# Patient Record
Sex: Female | Born: 1950 | Race: Black or African American | Hispanic: No | Smoking: Current every day smoker
Health system: Southern US, Community
[De-identification: ages and names within clinical notes are randomized; demographics above are authoritative.]

## PROBLEM LIST (undated history)

## (undated) DIAGNOSIS — G709 Myoneural disorder, unspecified: Secondary | ICD-10-CM

## (undated) DIAGNOSIS — D649 Anemia, unspecified: Secondary | ICD-10-CM

## (undated) DIAGNOSIS — J449 Chronic obstructive pulmonary disease, unspecified: Secondary | ICD-10-CM

## (undated) DIAGNOSIS — F4310 Post-traumatic stress disorder, unspecified: Secondary | ICD-10-CM

## (undated) DIAGNOSIS — F1911 Other psychoactive substance abuse, in remission: Secondary | ICD-10-CM

## (undated) DIAGNOSIS — B1920 Unspecified viral hepatitis C without hepatic coma: Secondary | ICD-10-CM

## (undated) DIAGNOSIS — F419 Anxiety disorder, unspecified: Secondary | ICD-10-CM

## (undated) DIAGNOSIS — T7840XA Allergy, unspecified, initial encounter: Secondary | ICD-10-CM

## (undated) DIAGNOSIS — I209 Angina pectoris, unspecified: Secondary | ICD-10-CM

## (undated) DIAGNOSIS — E7800 Pure hypercholesterolemia, unspecified: Secondary | ICD-10-CM

## (undated) DIAGNOSIS — M1990 Unspecified osteoarthritis, unspecified site: Secondary | ICD-10-CM

## (undated) DIAGNOSIS — I10 Essential (primary) hypertension: Secondary | ICD-10-CM

## (undated) DIAGNOSIS — J40 Bronchitis, not specified as acute or chronic: Secondary | ICD-10-CM

## (undated) DIAGNOSIS — F32A Depression, unspecified: Secondary | ICD-10-CM

## (undated) DIAGNOSIS — F329 Major depressive disorder, single episode, unspecified: Secondary | ICD-10-CM

## (undated) DIAGNOSIS — G43909 Migraine, unspecified, not intractable, without status migrainosus: Secondary | ICD-10-CM

## (undated) DIAGNOSIS — K219 Gastro-esophageal reflux disease without esophagitis: Secondary | ICD-10-CM

## (undated) DIAGNOSIS — J189 Pneumonia, unspecified organism: Secondary | ICD-10-CM

## (undated) HISTORY — DX: Myoneural disorder, unspecified: G70.9

## (undated) HISTORY — DX: Depression, unspecified: F32.A

## (undated) HISTORY — DX: Anxiety disorder, unspecified: F41.9

## (undated) HISTORY — DX: Allergy, unspecified, initial encounter: T78.40XA

## (undated) HISTORY — PX: TUBAL LIGATION: SHX77

## (undated) HISTORY — DX: Unspecified viral hepatitis C without hepatic coma: B19.20

## (undated) HISTORY — PX: TONSILLECTOMY: SUR1361

## (undated) HISTORY — PX: OTHER SURGICAL HISTORY: SHX169

## (undated) HISTORY — DX: Anemia, unspecified: D64.9

## (undated) HISTORY — DX: Migraine, unspecified, not intractable, without status migrainosus: G43.909

## (undated) HISTORY — DX: Unspecified osteoarthritis, unspecified site: M19.90

## (undated) HISTORY — DX: Other psychoactive substance abuse, in remission: F19.11

## (undated) HISTORY — DX: Major depressive disorder, single episode, unspecified: F32.9

---

## 2000-10-31 ENCOUNTER — Encounter (HOSPITAL_COMMUNITY): Admission: RE | Admit: 2000-10-31 | Discharge: 2000-11-30 | Payer: Self-pay | Admitting: Unknown Physician Specialty

## 2000-11-29 ENCOUNTER — Emergency Department (HOSPITAL_COMMUNITY): Admission: EM | Admit: 2000-11-29 | Discharge: 2000-11-29 | Payer: Self-pay | Admitting: Emergency Medicine

## 2000-11-29 ENCOUNTER — Encounter: Payer: Self-pay | Admitting: Emergency Medicine

## 2001-04-05 ENCOUNTER — Emergency Department (HOSPITAL_COMMUNITY): Admission: EM | Admit: 2001-04-05 | Discharge: 2001-04-05 | Payer: Self-pay | Admitting: Emergency Medicine

## 2002-07-22 ENCOUNTER — Emergency Department (HOSPITAL_COMMUNITY): Admission: EM | Admit: 2002-07-22 | Discharge: 2002-07-22 | Payer: Self-pay | Admitting: Emergency Medicine

## 2002-09-02 ENCOUNTER — Emergency Department (HOSPITAL_COMMUNITY): Admission: EM | Admit: 2002-09-02 | Discharge: 2002-09-02 | Payer: Self-pay | Admitting: Emergency Medicine

## 2002-12-31 ENCOUNTER — Emergency Department (HOSPITAL_COMMUNITY): Admission: EM | Admit: 2002-12-31 | Discharge: 2002-12-31 | Payer: Self-pay | Admitting: Emergency Medicine

## 2003-12-04 ENCOUNTER — Emergency Department (HOSPITAL_COMMUNITY): Admission: EM | Admit: 2003-12-04 | Discharge: 2003-12-04 | Payer: Self-pay | Admitting: Emergency Medicine

## 2004-04-22 ENCOUNTER — Emergency Department (HOSPITAL_COMMUNITY): Admission: EM | Admit: 2004-04-22 | Discharge: 2004-04-22 | Payer: Self-pay | Admitting: Emergency Medicine

## 2004-10-22 ENCOUNTER — Emergency Department (HOSPITAL_COMMUNITY): Admission: EM | Admit: 2004-10-22 | Discharge: 2004-10-22 | Payer: Self-pay | Admitting: Emergency Medicine

## 2004-11-05 ENCOUNTER — Emergency Department (HOSPITAL_COMMUNITY): Admission: EM | Admit: 2004-11-05 | Discharge: 2004-11-05 | Payer: Self-pay | Admitting: Emergency Medicine

## 2004-12-26 ENCOUNTER — Emergency Department (HOSPITAL_COMMUNITY): Admission: EM | Admit: 2004-12-26 | Discharge: 2004-12-26 | Payer: Self-pay | Admitting: Emergency Medicine

## 2005-01-11 ENCOUNTER — Emergency Department (HOSPITAL_COMMUNITY): Admission: EM | Admit: 2005-01-11 | Discharge: 2005-01-11 | Payer: Self-pay | Admitting: *Deleted

## 2005-02-14 ENCOUNTER — Emergency Department (HOSPITAL_COMMUNITY): Admission: EM | Admit: 2005-02-14 | Discharge: 2005-02-14 | Payer: Self-pay | Admitting: *Deleted

## 2005-02-24 ENCOUNTER — Ambulatory Visit (HOSPITAL_COMMUNITY): Admission: RE | Admit: 2005-02-24 | Discharge: 2005-02-24 | Payer: Self-pay | Admitting: Family Medicine

## 2005-04-16 ENCOUNTER — Emergency Department (HOSPITAL_COMMUNITY): Admission: EM | Admit: 2005-04-16 | Discharge: 2005-04-16 | Payer: Self-pay | Admitting: Emergency Medicine

## 2005-05-09 ENCOUNTER — Emergency Department (HOSPITAL_COMMUNITY): Admission: EM | Admit: 2005-05-09 | Discharge: 2005-05-09 | Payer: Self-pay | Admitting: Emergency Medicine

## 2005-07-14 ENCOUNTER — Emergency Department (HOSPITAL_COMMUNITY): Admission: EM | Admit: 2005-07-14 | Discharge: 2005-07-14 | Payer: Self-pay | Admitting: Emergency Medicine

## 2005-08-07 ENCOUNTER — Emergency Department (HOSPITAL_COMMUNITY): Admission: EM | Admit: 2005-08-07 | Discharge: 2005-08-07 | Payer: Self-pay | Admitting: Emergency Medicine

## 2005-08-13 ENCOUNTER — Emergency Department (HOSPITAL_COMMUNITY): Admission: EM | Admit: 2005-08-13 | Discharge: 2005-08-13 | Payer: Self-pay | Admitting: Emergency Medicine

## 2006-04-16 ENCOUNTER — Emergency Department (HOSPITAL_COMMUNITY): Admission: EM | Admit: 2006-04-16 | Discharge: 2006-04-16 | Payer: Self-pay | Admitting: Emergency Medicine

## 2006-06-09 ENCOUNTER — Emergency Department (HOSPITAL_COMMUNITY): Admission: EM | Admit: 2006-06-09 | Discharge: 2006-06-09 | Payer: Self-pay | Admitting: Emergency Medicine

## 2007-01-18 ENCOUNTER — Ambulatory Visit (HOSPITAL_COMMUNITY): Admission: RE | Admit: 2007-01-18 | Discharge: 2007-01-18 | Payer: Self-pay | Admitting: Family Medicine

## 2007-03-25 ENCOUNTER — Ambulatory Visit: Payer: Self-pay | Admitting: Orthopedic Surgery

## 2007-03-27 ENCOUNTER — Encounter: Payer: Self-pay | Admitting: Orthopedic Surgery

## 2007-06-27 ENCOUNTER — Ambulatory Visit: Payer: Self-pay | Admitting: Orthopedic Surgery

## 2007-10-08 ENCOUNTER — Ambulatory Visit: Payer: Self-pay | Admitting: Orthopedic Surgery

## 2007-10-28 ENCOUNTER — Encounter: Payer: Self-pay | Admitting: Orthopedic Surgery

## 2007-12-04 ENCOUNTER — Ambulatory Visit: Payer: Self-pay | Admitting: Orthopedic Surgery

## 2007-12-10 ENCOUNTER — Telehealth: Payer: Self-pay | Admitting: Orthopedic Surgery

## 2007-12-12 ENCOUNTER — Telehealth: Payer: Self-pay | Admitting: Orthopedic Surgery

## 2007-12-20 ENCOUNTER — Encounter: Payer: Self-pay | Admitting: Orthopedic Surgery

## 2008-02-03 ENCOUNTER — Telehealth: Payer: Self-pay | Admitting: Orthopedic Surgery

## 2008-02-04 ENCOUNTER — Encounter: Payer: Self-pay | Admitting: Orthopedic Surgery

## 2008-02-05 ENCOUNTER — Telehealth: Payer: Self-pay | Admitting: Orthopedic Surgery

## 2008-02-29 ENCOUNTER — Emergency Department (HOSPITAL_COMMUNITY): Admission: EM | Admit: 2008-02-29 | Discharge: 2008-02-29 | Payer: Self-pay | Admitting: Emergency Medicine

## 2008-11-11 ENCOUNTER — Ambulatory Visit: Payer: Self-pay | Admitting: Family Medicine

## 2008-11-11 DIAGNOSIS — M19041 Primary osteoarthritis, right hand: Secondary | ICD-10-CM | POA: Insufficient documentation

## 2008-11-11 DIAGNOSIS — I10 Essential (primary) hypertension: Secondary | ICD-10-CM

## 2008-11-11 DIAGNOSIS — G43909 Migraine, unspecified, not intractable, without status migrainosus: Secondary | ICD-10-CM | POA: Insufficient documentation

## 2008-11-11 DIAGNOSIS — M19042 Primary osteoarthritis, left hand: Secondary | ICD-10-CM

## 2008-11-11 LAB — CONVERTED CEMR LAB
Glucose, Urine, Semiquant: NEGATIVE
Nitrite: NEGATIVE
Specific Gravity, Urine: 1.02
WBC Urine, dipstick: NEGATIVE

## 2008-11-12 ENCOUNTER — Telehealth (INDEPENDENT_AMBULATORY_CARE_PROVIDER_SITE_OTHER): Payer: Self-pay | Admitting: *Deleted

## 2008-11-12 ENCOUNTER — Encounter (INDEPENDENT_AMBULATORY_CARE_PROVIDER_SITE_OTHER): Payer: Self-pay | Admitting: Family Medicine

## 2008-11-18 ENCOUNTER — Encounter (INDEPENDENT_AMBULATORY_CARE_PROVIDER_SITE_OTHER): Payer: Self-pay | Admitting: Family Medicine

## 2008-11-18 ENCOUNTER — Ambulatory Visit (HOSPITAL_COMMUNITY): Admission: RE | Admit: 2008-11-18 | Discharge: 2008-11-18 | Payer: Self-pay | Admitting: Family Medicine

## 2008-11-19 ENCOUNTER — Encounter (INDEPENDENT_AMBULATORY_CARE_PROVIDER_SITE_OTHER): Payer: Self-pay | Admitting: Family Medicine

## 2008-11-19 LAB — CONVERTED CEMR LAB
AST: 21 units/L (ref 0–37)
Albumin: 4.2 g/dL (ref 3.5–5.2)
Alkaline Phosphatase: 58 units/L (ref 39–117)
Basophils Absolute: 0 10*3/uL (ref 0.0–0.1)
Basophils Relative: 0 % (ref 0–1)
Calcium: 9.6 mg/dL (ref 8.4–10.5)
Chloride: 102 meq/L (ref 96–112)
Eosinophils Relative: 4 % (ref 0–5)
HCT: 34.4 % — ABNORMAL LOW (ref 36.0–46.0)
HDL: 38 mg/dL — ABNORMAL LOW (ref >39)
Hemoglobin: 11.2 g/dL — ABNORMAL LOW (ref 12.0–15.0)
LDL Cholesterol: 165 mg/dL — ABNORMAL HIGH (ref 0–99)
Monocytes Relative: 5 % (ref 3–12)
Platelets: 353 10*3/uL (ref 150–400)
Total Bilirubin: 0.3 mg/dL (ref 0.3–1.2)
Total Protein: 7.4 g/dL (ref 6.0–8.3)
Triglycerides: 212 mg/dL — ABNORMAL HIGH (ref <150)
WBC: 11.2 10*3/uL — ABNORMAL HIGH (ref 4.0–10.5)

## 2008-11-25 ENCOUNTER — Ambulatory Visit: Payer: Self-pay | Admitting: Family Medicine

## 2008-11-25 ENCOUNTER — Emergency Department (HOSPITAL_COMMUNITY): Admission: EM | Admit: 2008-11-25 | Discharge: 2008-11-25 | Payer: Self-pay | Admitting: Emergency Medicine

## 2008-11-26 ENCOUNTER — Ambulatory Visit: Payer: Self-pay | Admitting: Family Medicine

## 2008-11-26 DIAGNOSIS — E785 Hyperlipidemia, unspecified: Secondary | ICD-10-CM

## 2008-11-26 DIAGNOSIS — D649 Anemia, unspecified: Secondary | ICD-10-CM

## 2008-11-26 DIAGNOSIS — E782 Mixed hyperlipidemia: Secondary | ICD-10-CM | POA: Insufficient documentation

## 2008-11-26 LAB — CONVERTED CEMR LAB
HDL goal, serum: 40 mg/dL
LDL Goal: 100 mg/dL

## 2008-11-27 ENCOUNTER — Encounter (INDEPENDENT_AMBULATORY_CARE_PROVIDER_SITE_OTHER): Payer: Self-pay | Admitting: Family Medicine

## 2008-11-27 LAB — CONVERTED CEMR LAB
Saturation Ratios: 32 % (ref 20–55)
TIBC: 289 ug/dL (ref 250–470)
UIBC: 196 ug/dL

## 2009-01-06 ENCOUNTER — Encounter (INDEPENDENT_AMBULATORY_CARE_PROVIDER_SITE_OTHER): Payer: Self-pay | Admitting: Family Medicine

## 2009-02-24 ENCOUNTER — Emergency Department (HOSPITAL_COMMUNITY): Admission: EM | Admit: 2009-02-24 | Discharge: 2009-02-24 | Payer: Self-pay | Admitting: Emergency Medicine

## 2009-03-20 ENCOUNTER — Emergency Department (HOSPITAL_COMMUNITY): Admission: EM | Admit: 2009-03-20 | Discharge: 2009-03-20 | Payer: Self-pay | Admitting: Emergency Medicine

## 2009-04-30 ENCOUNTER — Encounter: Payer: Self-pay | Admitting: Gastroenterology

## 2009-04-30 ENCOUNTER — Ambulatory Visit (HOSPITAL_COMMUNITY): Admission: RE | Admit: 2009-04-30 | Discharge: 2009-04-30 | Payer: Self-pay | Admitting: Family Medicine

## 2009-07-23 ENCOUNTER — Ambulatory Visit (HOSPITAL_COMMUNITY): Admission: RE | Admit: 2009-07-23 | Discharge: 2009-07-23 | Payer: Self-pay | Admitting: Family Medicine

## 2009-08-25 ENCOUNTER — Emergency Department (HOSPITAL_COMMUNITY): Admission: EM | Admit: 2009-08-25 | Discharge: 2009-08-25 | Payer: Self-pay | Admitting: Emergency Medicine

## 2010-03-06 ENCOUNTER — Emergency Department (HOSPITAL_COMMUNITY): Admission: EM | Admit: 2010-03-06 | Discharge: 2010-03-06 | Payer: Self-pay | Admitting: Emergency Medicine

## 2010-07-31 ENCOUNTER — Encounter: Payer: Self-pay | Admitting: Family Medicine

## 2010-08-18 ENCOUNTER — Emergency Department (HOSPITAL_COMMUNITY): Payer: Self-pay

## 2010-08-18 ENCOUNTER — Emergency Department (HOSPITAL_COMMUNITY)
Admission: EM | Admit: 2010-08-18 | Discharge: 2010-08-18 | Disposition: A | Discharge status: Home / Self Care | Payer: Self-pay | Attending: Emergency Medicine | Admitting: Emergency Medicine

## 2010-08-18 DIAGNOSIS — IMO0001 Reserved for inherently not codable concepts without codable children: Secondary | ICD-10-CM | POA: Insufficient documentation

## 2010-08-18 DIAGNOSIS — J40 Bronchitis, not specified as acute or chronic: Secondary | ICD-10-CM | POA: Insufficient documentation

## 2010-08-18 DIAGNOSIS — R062 Wheezing: Secondary | ICD-10-CM | POA: Insufficient documentation

## 2010-08-18 DIAGNOSIS — E7800 Pure hypercholesterolemia, unspecified: Secondary | ICD-10-CM | POA: Insufficient documentation

## 2010-08-18 DIAGNOSIS — R05 Cough: Secondary | ICD-10-CM | POA: Insufficient documentation

## 2010-08-18 DIAGNOSIS — Z79899 Other long term (current) drug therapy: Secondary | ICD-10-CM | POA: Insufficient documentation

## 2010-08-18 DIAGNOSIS — R509 Fever, unspecified: Secondary | ICD-10-CM | POA: Insufficient documentation

## 2010-08-18 DIAGNOSIS — I10 Essential (primary) hypertension: Secondary | ICD-10-CM | POA: Insufficient documentation

## 2010-08-18 DIAGNOSIS — R059 Cough, unspecified: Secondary | ICD-10-CM | POA: Insufficient documentation

## 2010-09-15 ENCOUNTER — Telehealth (INDEPENDENT_AMBULATORY_CARE_PROVIDER_SITE_OTHER): Payer: Self-pay | Admitting: *Deleted

## 2010-09-20 NOTE — Progress Notes (Signed)
  Phone Note Outgoing Call Call back at [facility] Phone [phone]   Call placed by: Carolan Clines  Details for Reason: Tcs Screening Summary of Call: pt answered phone but said she has no minutes left on her phone. i asked her to call the office back when she has minutes and she ended the call.

## 2010-09-29 ENCOUNTER — Telehealth: Payer: Self-pay | Admitting: Internal Medicine

## 2010-10-03 NOTE — Telephone Encounter (Signed)
I called the pt to schedule her tcs and she stated she has a lot going on right now.  She will call to schedule later.Cynthia KitchenMarland [name]

## 2010-10-14 LAB — BASIC METABOLIC PANEL
BUN: 16 mg/dL (ref 6–23)
Chloride: 106 mEq/L (ref 96–112)
GFR calc Af Amer: >60 mL/min (ref >60)
Potassium: 3.6 mEq/L (ref 3.5–5.1)

## 2010-11-11 ENCOUNTER — Encounter: Payer: Self-pay | Admitting: Internal Medicine

## 2010-12-26 ENCOUNTER — Other Ambulatory Visit (HOSPITAL_COMMUNITY): Payer: Self-pay | Admitting: Family Medicine

## 2010-12-26 DIAGNOSIS — Z139 Encounter for screening, unspecified: Secondary | ICD-10-CM

## 2010-12-30 ENCOUNTER — Ambulatory Visit (HOSPITAL_COMMUNITY)
Admission: RE | Admit: 2010-12-30 | Discharge: 2010-12-30 | Disposition: A | Discharge status: Home / Self Care | Payer: Self-pay | Source: Ambulatory Visit | Attending: Family Medicine | Admitting: Family Medicine

## 2010-12-30 DIAGNOSIS — Z139 Encounter for screening, unspecified: Secondary | ICD-10-CM

## 2010-12-30 DIAGNOSIS — Z1231 Encounter for screening mammogram for malignant neoplasm of breast: Secondary | ICD-10-CM | POA: Insufficient documentation

## 2011-01-24 ENCOUNTER — Encounter: Payer: Self-pay | Admitting: *Deleted

## 2011-01-24 ENCOUNTER — Emergency Department (HOSPITAL_COMMUNITY)
Admission: EM | Admit: 2011-01-24 | Discharge: 2011-01-24 | Disposition: A | Discharge status: Home / Self Care | Payer: BC Managed Care – PPO | Attending: Emergency Medicine | Admitting: Emergency Medicine

## 2011-01-24 DIAGNOSIS — B029 Zoster without complications: Secondary | ICD-10-CM | POA: Insufficient documentation

## 2011-01-24 HISTORY — DX: Essential (primary) hypertension: I10

## 2011-01-24 HISTORY — DX: Pure hypercholesterolemia, unspecified: E78.00

## 2011-01-24 HISTORY — DX: Bronchitis, not specified as acute or chronic: J40

## 2011-01-24 MED ORDER — OXYCODONE-ACETAMINOPHEN 5-325 MG PO TABS: 2.0000 | ORAL_TABLET | ORAL | Status: AC | PRN

## 2011-01-24 NOTE — ED Provider Notes (Signed)
History     Chief Complaint  Patient presents with  . Rash   Patient is a 60 y.o. female presenting with rash. The history is provided by the patient. No language interpreter was used.  Rash  This is a new problem. Episode onset: 1 week ago. The problem has been gradually worsening. The problem is associated with nothing. There has been no fever. Affected Location: left lateral chest and left-side abdomen. The pain is moderate. The pain has been constant since onset. Associated symptoms include pain. Pertinent negatives include no itching and no weeping.  Patient arrived via private vehicle. C/o red papular rash to left lateral chest and left abdomen onset 1 week ago with delayed onset of associated pain which is worsening since onset. Patient also notes chills, diarrhea and abdominal pain. Notes she has not previously had the shingles vaccine. Denies nausea, vomiting and fever.  PCP- Dr. Loleta Chance No past medical history on file.  No past surgical history on file.  No family history on file.  History  Substance Use Topics  . Smoking status: Not on file  . Smokeless tobacco: Not on file  . Alcohol Use: Not on file    OB History    No data available      Review of Systems  Constitutional: Positive for chills. Negative for fever.  Respiratory: Negative for shortness of breath.   Cardiovascular: Negative for chest pain.  Gastrointestinal: Positive for abdominal pain and diarrhea. Negative for nausea and vomiting.  Skin: Positive for rash. Negative for itching.  Neurological: Negative for numbness.  All other systems reviewed and are negative.  All other systems negative except as noted in HPI.    Physical Exam  There were no vitals taken for this visit.  Physical Exam  Nursing note and vitals reviewed. Constitutional: She is oriented to person, place, and time. Vital signs are normal. She appears well-developed and well-nourished.  HENT:  Head: Normocephalic and atraumatic.    Eyes: Pupils are equal, round, and reactive to light.  Neck: Neck supple.  Cardiovascular: Normal rate, regular rhythm and normal pulses.   Pulmonary/Chest: Effort normal.  Abdominal: Soft. She exhibits no distension.  Musculoskeletal: Normal range of motion.  Neurological: She is alert and oriented to person, place, and time. No sensory deficit.  Skin: Skin is warm and dry. Rash noted. No erythema.       No increased warmth over area of rash. Rash to left lateral chest wall and left abdomen along the T10-T8 dermatome which is papular in nature with no erythema or increased warmth to suggest cellulitis and no associated drainage.   Psychiatric: Her behavior is normal.       Tearful    ED Course  Procedures  MDM 8:15 AM Pt with shingles in left dermatomal region. pts rash too far out to treat with antivirals and steroids. Pt opted for nonnarcotic pain meds in ER and home with prescription for pain meds so that she can drive home. Home with pain meds and close pcp followup    Chart written by Clarita Crane acting as scribe for Dr. Patria Mane  I personally performed the services described in this documentation, which was scribed in my presence. The recorded information has been reviewed and considered.    Lyanne Co, MD 01/24/11 [phone]

## 2011-01-24 NOTE — ED Notes (Signed)
Pt states that she has had a rash x 1 week. Blisters noted to left side of abdomen and back. C/o severe pain with rash. No drainage noted.

## 2011-01-24 NOTE — ED Notes (Signed)
Patient with no complaints at this time. Respirations even and unlabored. Skin warm/dry. Discharge instructions reviewed with patient at this time. Patient given opportunity to voice concerns/ask questions. Patient discharged at this time and left Emergency Department with steady gait.   

## 2011-05-30 ENCOUNTER — Encounter (HOSPITAL_COMMUNITY): Payer: Self-pay | Admitting: Emergency Medicine

## 2011-05-30 ENCOUNTER — Emergency Department (HOSPITAL_COMMUNITY): Payer: BC Managed Care – PPO

## 2011-05-30 ENCOUNTER — Emergency Department (HOSPITAL_COMMUNITY)
Admission: EM | Admit: 2011-05-30 | Discharge: 2011-05-30 | Disposition: A | Discharge status: Home / Self Care | Payer: BC Managed Care – PPO | Attending: Emergency Medicine | Admitting: Emergency Medicine

## 2011-05-30 DIAGNOSIS — J40 Bronchitis, not specified as acute or chronic: Secondary | ICD-10-CM | POA: Insufficient documentation

## 2011-05-30 DIAGNOSIS — F17200 Nicotine dependence, unspecified, uncomplicated: Secondary | ICD-10-CM | POA: Insufficient documentation

## 2011-05-30 DIAGNOSIS — I10 Essential (primary) hypertension: Secondary | ICD-10-CM | POA: Insufficient documentation

## 2011-05-30 DIAGNOSIS — G43909 Migraine, unspecified, not intractable, without status migrainosus: Secondary | ICD-10-CM | POA: Insufficient documentation

## 2011-05-30 DIAGNOSIS — E7800 Pure hypercholesterolemia, unspecified: Secondary | ICD-10-CM | POA: Insufficient documentation

## 2011-05-30 MED ORDER — ALBUTEROL SULFATE HFA 108 (90 BASE) MCG/ACT IN AERS
2.0000 | INHALATION_SPRAY | Freq: Once | RESPIRATORY_TRACT | Status: AC
  Administered 2011-05-30: 2 via RESPIRATORY_TRACT
  Filled 2011-05-30: qty 6.7

## 2011-05-30 MED ORDER — HYDROCOD POLST-CHLORPHEN POLST 10-8 MG/5ML PO LQCR
5.0000 mL | Freq: Once | ORAL | Status: AC
  Administered 2011-05-30: 5 mL via ORAL
  Filled 2011-05-30: qty 5

## 2011-05-30 MED ORDER — GUAIFENESIN-CODEINE 100-10 MG/5ML PO SYRP: ORAL_SOLUTION | ORAL | Status: DC

## 2011-05-30 NOTE — ED Notes (Signed)
Patient states diagnosed with bronchitis; states has run out of her inhaler.  Presents tonight with chronic cough.

## 2011-05-30 NOTE — ED Notes (Signed)
Pt stable at discharge; ambulatory with no c/os

## 2011-05-30 NOTE — ED Provider Notes (Signed)
History     CSN: [id number] Arrival date & time: 05/30/2011 12:17 AM   First MD Initiated Contact with Patient 05/30/11 0006      Chief Complaint  Patient presents with  . Cough    (Consider location/radiation/quality/duration/timing/severity/associated sxs/prior treatment) HPI Comments: Pt states she ran out of her inhaler but doesn't know the name of it.  She has been coughing for the past 3 days but also states she has a chronic cough.  Patient is a 60 y.o. female presenting with cough. The history is provided by the patient. No language interpreter was used.  Cough This is a new problem. The problem occurs every few minutes. The problem has been gradually worsening. The cough is non-productive. Maximum temperature: subjective fever. Pertinent negatives include no shortness of breath and no wheezing. She has tried nothing for the symptoms. She is a smoker. Her past medical history is significant for bronchitis. Her past medical history does not include pneumonia.    Past Medical History  Diagnosis Date  . Migraine   . Hypertension   . Bronchitis   . High blood cholesterol level     Past Surgical History  Procedure Date  . Left arm     Family History  Problem Relation Age of Onset  . Heart failure Father   . Hypertension Father   . Stroke Father     History  Substance Use Topics  . Smoking status: Current Everyday Smoker -- 0.5 packs/day for 28 years    Types: Cigarettes  . Smokeless tobacco: Not on file  . Alcohol Use: No    OB History    Grav Para Term Preterm Abortions TAB SAB Ect Mult Living   2 1   1            Review of Systems  Constitutional: Positive for fever.  Respiratory: Positive for cough. Negative for chest tightness, shortness of breath, wheezing and stridor.   All other systems reviewed and are negative.    Allergies  Aspirin  Home Medications   Current Outpatient Rx  Name Route Sig Dispense Refill  . BUTALBITAL-APAP-CAFF-COD  50-325-40-30 MG PO CAPS Oral Take 1 capsule by mouth every 6 (six) hours as needed.      . IRON 28 MG PO TABS Oral Take 1 tablet by mouth 1 day or 1 dose.      Marland Kitchen LISINOPRIL-HYDROCHLOROTHIAZIDE 10-12.5 MG PO TABS Oral Take 1 tablet by mouth daily.      Marland Kitchen NAPROXEN-ESOMEPRAZOLE 500-20 MG PO TBEC Oral Take 1 tablet by mouth 1 day or 1 dose.      Marland Kitchen SIMVASTATIN 20 MG PO TABS Oral Take 20 mg by mouth at bedtime.      Marland Kitchen OVER THE COUNTER MEDICATION Oral Take 1 tablet by mouth 1 day or 1 dose.        BP 116/87  Pulse 96  Temp(Src) 98.5 F (36.9 C) (Oral)  Resp 20  Ht 5\' 5"  (1.651 m)  Wt 120 lb (54.432 kg)  BMI 19.97 kg/m2  SpO2 99%  Physical Exam  Nursing note and vitals reviewed. Constitutional: She is oriented to person, place, and time. She appears well-developed and well-nourished. No distress.  HENT:  Head: Normocephalic and atraumatic.  Eyes: EOM are normal.  Neck: Normal range of motion. No JVD present.  Cardiovascular: Normal rate, regular rhythm and normal heart sounds.   Pulmonary/Chest: Breath sounds normal. No accessory muscle usage. Not tachypneic. No respiratory distress. She has no decreased breath sounds.  She has no wheezes. She has no rales. She exhibits no tenderness.  Abdominal: Soft. She exhibits no distension. There is no tenderness.  Musculoskeletal: Normal range of motion.  Neurological: She is alert and oriented to person, place, and time.  Skin: Skin is warm and dry. She is not diaphoretic.  Psychiatric: She has a normal mood and affect. Judgment normal.    ED Course  Procedures (including critical care time)  Labs Reviewed - No data to display No results found.   No diagnosis found.    MDM          Worthy Rancher, PA 05/30/11 [phone]

## 2011-05-30 NOTE — ED Provider Notes (Signed)
Medical screening examination/treatment/procedure(s) were performed by non-physician practitioner and as supervising physician I was immediately available for consultation/collaboration.  Celene Kras, MD 05/30/11 [phone]

## 2011-07-30 ENCOUNTER — Emergency Department (HOSPITAL_COMMUNITY): Payer: BC Managed Care – PPO

## 2011-07-30 ENCOUNTER — Encounter (HOSPITAL_COMMUNITY): Payer: Self-pay | Admitting: *Deleted

## 2011-07-30 ENCOUNTER — Emergency Department (HOSPITAL_COMMUNITY)
Admission: EM | Admit: 2011-07-30 | Discharge: 2011-07-30 | Disposition: A | Discharge status: Home / Self Care | Payer: BC Managed Care – PPO | Attending: Emergency Medicine | Admitting: Emergency Medicine

## 2011-07-30 DIAGNOSIS — Z79899 Other long term (current) drug therapy: Secondary | ICD-10-CM | POA: Insufficient documentation

## 2011-07-30 DIAGNOSIS — E7800 Pure hypercholesterolemia, unspecified: Secondary | ICD-10-CM | POA: Insufficient documentation

## 2011-07-30 DIAGNOSIS — W19XXXA Unspecified fall, initial encounter: Secondary | ICD-10-CM

## 2011-07-30 DIAGNOSIS — M25512 Pain in left shoulder: Secondary | ICD-10-CM

## 2011-07-30 DIAGNOSIS — M79609 Pain in unspecified limb: Secondary | ICD-10-CM | POA: Insufficient documentation

## 2011-07-30 DIAGNOSIS — S46009A Unspecified injury of muscle(s) and tendon(s) of the rotator cuff of unspecified shoulder, initial encounter: Secondary | ICD-10-CM

## 2011-07-30 DIAGNOSIS — W010XXA Fall on same level from slipping, tripping and stumbling without subsequent striking against object, initial encounter: Secondary | ICD-10-CM | POA: Insufficient documentation

## 2011-07-30 DIAGNOSIS — S46909A Unspecified injury of unspecified muscle, fascia and tendon at shoulder and upper arm level, unspecified arm, initial encounter: Secondary | ICD-10-CM | POA: Insufficient documentation

## 2011-07-30 DIAGNOSIS — M25519 Pain in unspecified shoulder: Secondary | ICD-10-CM | POA: Insufficient documentation

## 2011-07-30 DIAGNOSIS — S4980XA Other specified injuries of shoulder and upper arm, unspecified arm, initial encounter: Secondary | ICD-10-CM | POA: Insufficient documentation

## 2011-07-30 DIAGNOSIS — I10 Essential (primary) hypertension: Secondary | ICD-10-CM | POA: Insufficient documentation

## 2011-07-30 MED ORDER — OXYCODONE-ACETAMINOPHEN 5-325 MG PO TABS
1.0000 | ORAL_TABLET | Freq: Once | ORAL | Status: AC
  Administered 2011-07-30: 1 via ORAL
  Filled 2011-07-30: qty 1

## 2011-07-30 MED ORDER — CYCLOBENZAPRINE HCL 10 MG PO TABS: 10.0000 mg | ORAL_TABLET | Freq: Three times a day (TID) | ORAL | Status: AC | PRN

## 2011-07-30 MED ORDER — HYDROCODONE-ACETAMINOPHEN 5-325 MG PO TABS: 2.0000 | ORAL_TABLET | Freq: Four times a day (QID) | ORAL | Status: AC | PRN

## 2011-07-30 NOTE — ED Notes (Signed)
Pt states that she slipped and fell yesterday. C/o pain in her left arm.

## 2011-07-30 NOTE — ED Provider Notes (Signed)
Scribed for Ward Givens, MD, the patient was seen in room APA07/APA07 . This chart was scribed by Ellie Lunch.   CSN: [id number]  Arrival date & time 07/30/11  6962   First MD Initiated Contact with Patient 07/30/11 [phone]      Chief Complaint  Patient presents with  . Fall    (Consider location/radiation/quality/duration/timing/severity/associated sxs/prior treatment) HPI  Pt seen at 7:49 AM Cynthia [name] is a 61 y.o. female who presents to the Emergency Department complaining of a fall. Pt fell yesterday on some ice. Pt fell sideways to the left landing on her left arm. Pt denies hitting head or LOC. Pt complains of pain to left arm. Pain is located entire arm. Pain is the worst in the left upper arm and left shouldler. Pain is worse with abduction of left arm. Pt denies any other pain related to injury. Pt is left hand dominant. Movement of the arm or palpation makes the pain worse Pt has previously seen Dr. Romeo Apple for orthopedic care.   PCP [facility].   Past Medical History  Diagnosis Date  . Migraine   . Hypertension   . Bronchitis   . High blood cholesterol level     Past Surgical History  Procedure Date  . Left arm     Family History  Problem Relation Age of Onset  . Heart failure Father   . Hypertension Father   . Stroke Father     History  Substance Use Topics  . Smoking status: Current Everyday Smoker -- 0.5 packs/day for 28 years    Types: Cigarettes  . Smokeless tobacco: Not on file  . Alcohol Use: No  Currently trying to quit smoking Pt is not currently working. 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  Lives with spouse  OB History    Grav Para Term Preterm Abortions TAB SAB Ect Mult Living   2 1   1            Review of Systems  Musculoskeletal:       Left arm and shoulder pain  Neurological: Negative for syncope and headaches.  All other systems reviewed and are negative.    Allergies  Aspirin  Home Medications   Current Outpatient Rx  Name  Route Sig Dispense Refill  . BUTALBITAL-APAP-CAFF-COD 50-325-40-30 MG PO CAPS Oral Take 1 capsule by mouth every 6 (six) hours as needed.      . IRON 28 MG PO TABS Oral Take 1 tablet by mouth 1 day or 1 dose.      Marland Kitchen GUAIFENESIN-CODEINE 100-10 MG/5ML PO SYRP  10 ml q 4-6 hrs prn cough 240 mL 0  . LISINOPRIL-HYDROCHLOROTHIAZIDE 10-12.5 MG PO TABS Oral Take 1 tablet by mouth daily.      Marland Kitchen NAPROXEN-ESOMEPRAZOLE 500-20 MG PO TBEC Oral Take 1 tablet by mouth 1 day or 1 dose.      Marland Kitchen OVER THE COUNTER MEDICATION Oral Take 1 tablet by mouth 1 day or 1 dose.      Marland Kitchen SIMVASTATIN 20 MG PO TABS Oral Take 20 mg by mouth at bedtime.        BP 134/80  Pulse 75  Temp(Src) 97.9 F (36.6 C) (Oral)  Resp 18  Ht 5\' 5"  (1.651 m)  Wt 113 lb (51.256 kg)  BMI 18.80 kg/m2  SpO2 100% Vital signs normal    Physical Exam  Nursing note and vitals reviewed. Constitutional: She is oriented to person, place, and time. She appears well-developed and well-nourished.  Non-toxic appearance. She does not appear ill. No distress.  HENT:  Head: Normocephalic and atraumatic.  Right Ear: External ear normal.  Left Ear: External ear normal.  Nose: Nose normal. No mucosal edema or rhinorrhea.  Mouth/Throat: Oropharynx is clear and moist and mucous membranes are normal. No dental abscesses or uvula swelling.  Eyes: Conjunctivae and EOM are normal. Pupils are equal, round, and reactive to light.  Neck: Normal range of motion and full passive range of motion without pain. Neck supple.  Cardiovascular: Normal rate, regular rhythm and normal heart sounds.  Exam reveals no gallop and no friction rub.   No murmur heard. Pulmonary/Chest: Effort normal and breath sounds normal. No respiratory distress. She has no wheezes. She has no rhonchi. She has no rales. She exhibits no tenderness and no crepitus.  Abdominal: Normal appearance.  Musculoskeletal: Normal range of motion. She exhibits no edema and no tenderness.       Pt has  non-tender clavicles. She has no pain on flex/ext of her left elbow or wrist. Her hand is non-tender to palpation. She is very tender to palpation in her left shoulder with mild swelling. She has pain on attempted ROM of her left shoulder. Pulses and sensation intact.   Neurological: She is alert and oriented to person, place, and time. She has normal strength. No cranial nerve deficit.  Skin: Skin is warm, dry and intact. No rash noted. No erythema. No pallor.  Psychiatric: She has a normal mood and affect. Her speech is normal and behavior is normal. Her mood appears not anxious.    ED Course  Procedures (including critical care time)  Pt given Ice pack and percocet for pain. After reviewing her xrays she was placed in a sling by nursing staff.    DIAGNOSTIC STUDIES: Oxygen Saturation is 100% on room air, normal by my interpretation.    COORDINATION OF CARE:  Dg Shoulder Left  07/30/2011  *RADIOLOGY REPORT*  Clinical Data: Left shoulder pain, fall  LEFT SHOULDER - 2+ VIEW  Comparison: None.  Findings: No fracture or dislocation is seen.  The joint spaces are preserved.  The visualized soft tissues are unremarkable.  Visualized left lung is clear.  IMPRESSION: No fracture or dislocation is seen.  Original Report Authenticated By: Charline Bills, M.D.    ED MEDICATIONS Medications  oxyCODONE-acetaminophen (PERCOCET) 5-325 MG per tablet 1 tablet     Diagnoses that have been ruled out:  None  Diagnoses that are still under consideration:  None  Final diagnoses:  Fall  Shoulder pain, left  Rotator cuff injury   New Prescriptions   CYCLOBENZAPRINE (FLEXERIL) 10 MG TABLET    Take 1 tablet (10 mg total) by mouth 3 (three) times daily as needed for muscle spasms.   HYDROCODONE-ACETAMINOPHEN (NORCO) 5-325 MG PER TABLET    Take 2 tablets by mouth every 6 (six) hours as needed for pain.    Plan discharge  MDM    I personally performed the services described in this  documentation, which was scribed in my presence. The recorded information has been reviewed and considered. Devoria Albe, MD, FACEP        Ward Givens, MD 07/30/11 [phone]

## 2011-08-10 ENCOUNTER — Ambulatory Visit: Payer: BC Managed Care – PPO | Admitting: Orthopedic Surgery

## 2011-09-11 ENCOUNTER — Emergency Department (HOSPITAL_COMMUNITY): Payer: Self-pay

## 2011-09-11 ENCOUNTER — Encounter (HOSPITAL_COMMUNITY): Payer: Self-pay | Admitting: *Deleted

## 2011-09-11 DIAGNOSIS — Y92009 Unspecified place in unspecified non-institutional (private) residence as the place of occurrence of the external cause: Secondary | ICD-10-CM | POA: Insufficient documentation

## 2011-09-11 DIAGNOSIS — IMO0002 Reserved for concepts with insufficient information to code with codable children: Secondary | ICD-10-CM | POA: Insufficient documentation

## 2011-09-11 DIAGNOSIS — F17200 Nicotine dependence, unspecified, uncomplicated: Secondary | ICD-10-CM | POA: Insufficient documentation

## 2011-09-11 DIAGNOSIS — S92919A Unspecified fracture of unspecified toe(s), initial encounter for closed fracture: Secondary | ICD-10-CM | POA: Insufficient documentation

## 2011-09-11 DIAGNOSIS — G43909 Migraine, unspecified, not intractable, without status migrainosus: Secondary | ICD-10-CM | POA: Insufficient documentation

## 2011-09-11 DIAGNOSIS — I10 Essential (primary) hypertension: Secondary | ICD-10-CM | POA: Insufficient documentation

## 2011-09-11 DIAGNOSIS — E7800 Pure hypercholesterolemia, unspecified: Secondary | ICD-10-CM | POA: Insufficient documentation

## 2011-09-11 NOTE — ED Notes (Signed)
Pt reports she hit her rt foot on the door frame this am and now c/o pain to top of rt foot and great toe

## 2011-09-12 ENCOUNTER — Emergency Department (HOSPITAL_COMMUNITY)
Admission: EM | Admit: 2011-09-12 | Discharge: 2011-09-12 | Disposition: A | Discharge status: Home / Self Care | Payer: Self-pay | Attending: Emergency Medicine | Admitting: Emergency Medicine

## 2011-09-12 DIAGNOSIS — S92919A Unspecified fracture of unspecified toe(s), initial encounter for closed fracture: Secondary | ICD-10-CM

## 2011-09-12 MED ORDER — OXYCODONE-ACETAMINOPHEN 5-325 MG PO TABS: 1.0000 | ORAL_TABLET | ORAL | Status: AC | PRN

## 2011-09-12 MED ORDER — OXYCODONE-ACETAMINOPHEN 5-325 MG PO TABS
1.0000 | ORAL_TABLET | Freq: Once | ORAL | Status: AC
  Administered 2011-09-12: 1 via ORAL
  Filled 2011-09-12: qty 1

## 2011-09-12 NOTE — ED Provider Notes (Signed)
Medical screening examination/treatment/procedure(s) were performed by non-physician practitioner and as supervising physician I was immediately available for consultation/collaboration. Devoria Albe, MD, Armando Gang   Ward Givens, MD 09/12/11 [phone]

## 2011-09-12 NOTE — ED Provider Notes (Signed)
History     CSN: [id number]  Arrival date & time 09/11/11  2248   First MD Initiated Contact with Patient 09/12/11 0015      Chief Complaint  Patient presents with  . Foot Injury    (Consider location/radiation/quality/duration/timing/severity/associated sxs/prior treatment) Patient is a 61 y.o. female presenting with foot injury. The history is provided by the patient.  Foot Injury  The incident occurred 6 to 12 hours ago. The incident occurred at home. The injury mechanism was a direct blow. The pain is present in the right foot. The quality of the pain is described as aching. The pain is at a severity of 10/10. The pain is severe. The pain has been constant since onset. Pertinent negatives include no numbness and no loss of sensation. The symptoms are aggravated by bearing weight and palpation. She has tried nothing for the symptoms.    Past Medical History  Diagnosis Date  . Migraine   . Hypertension   . Bronchitis   . High blood cholesterol level     Past Surgical History  Procedure Date  . Left arm     Family History  Problem Relation Age of Onset  . Heart failure Father   . Hypertension Father   . Stroke Father     History  Substance Use Topics  . Smoking status: Current Everyday Smoker -- 0.5 packs/day for 28 years    Types: Cigarettes  . Smokeless tobacco: Not on file  . Alcohol Use: No    OB History    Grav Para Term Preterm Abortions TAB SAB Ect Mult Living   2 1   1            Review of Systems  Constitutional: Negative for fever.  HENT: Negative for congestion, sore throat and neck pain.   Eyes: Negative.   Respiratory: Negative for chest tightness and shortness of breath.   Cardiovascular: Negative for chest pain.  Gastrointestinal: Negative for nausea and abdominal pain.  Genitourinary: Negative.   Musculoskeletal: Positive for arthralgias. Negative for joint swelling.  Skin: Negative.  Negative for rash and wound.  Neurological: Negative  for dizziness, weakness, light-headedness, numbness and headaches.  Hematological: Negative.   Psychiatric/Behavioral: Negative.     Allergies  Aspirin  Home Medications   Current Outpatient Rx  Name Route Sig Dispense Refill  . BUTALBITAL-APAP-CAFF-COD 50-325-40-30 MG PO CAPS Oral Take 1 capsule by mouth every 6 (six) hours as needed.      . IRON 28 MG PO TABS Oral Take 1 tablet by mouth 1 day or 1 dose.      Marland Kitchen GUAIFENESIN-CODEINE 100-10 MG/5ML PO SYRP  10 ml q 4-6 hrs prn cough 240 mL 0  . LISINOPRIL-HYDROCHLOROTHIAZIDE 10-12.5 MG PO TABS Oral Take 1 tablet by mouth daily.      Marland Kitchen NAPROXEN-ESOMEPRAZOLE 500-20 MG PO TBEC Oral Take 1 tablet by mouth 1 day or 1 dose.      Marland Kitchen OVER THE COUNTER MEDICATION Oral Take 1 tablet by mouth 1 day or 1 dose.      . OXYCODONE-ACETAMINOPHEN 5-325 MG PO TABS Oral Take 1 tablet by mouth every 4 (four) hours as needed for pain. 20 tablet 0  . SIMVASTATIN 20 MG PO TABS Oral Take 20 mg by mouth at bedtime.        BP 105/72  Pulse 93  Temp(Src) 97.8 F (36.6 C) (Oral)  Resp 20  Ht 5\' 5"  (1.651 m)  Wt 113 lb (51.256 kg)  BMI  18.80 kg/m2  SpO2 97%  Physical Exam  Nursing note and vitals reviewed. Constitutional: She is oriented to person, place, and time. She appears well-developed and well-nourished.  HENT:  Head: Normocephalic.  Eyes: Conjunctivae are normal.  Neck: Normal range of motion.  Cardiovascular: Normal rate and intact distal pulses.  Exam reveals no decreased pulses.   Pulses:      Dorsalis pedis pulses are 2+ on the right side, and 2+ on the left side.  Pulmonary/Chest: Effort normal.  Musculoskeletal: She exhibits edema and tenderness.       Feet:  Neurological: She is alert and oriented to person, place, and time. No sensory deficit.  Skin: Skin is warm, dry and intact.    ED Course  Procedures (including critical care time)  Labs Reviewed - No data to display Dg Foot Complete Right  09/11/2011  *RADIOLOGY REPORT*   Clinical Data: Hit right foot on door frame, pain, bruising at great toe  RIGHT FOOT COMPLETE - 3+ VIEW  Comparison: None  Findings: Mild hallux valgus. Joint spaces preserved. Fracture identified at base of distal phalanx right great toe with intra-articular extension at IP joint. No additional fracture, dislocation, or bone destruction.  IMPRESSION: Fracture at base of distal phalanx right great toe with intra- articular extension at the IP joint. Hallux valgus.  Original Report Authenticated By: Lollie Marrow, M.D.     1. Phalanx fracture, foot       MDM  Buddy tape,  Post op shoe applied.  Referral to Dr.Keeling for recheck this week.        Candis Musa, PA 09/12/11 1258

## 2011-09-12 NOTE — Discharge Instructions (Signed)
Toe Fracture  Your caregiver has diagnosed you as having a fractured toe. A toe fracture is a break in the bone of a toe. "Buddy taping" is a way of splinting your broken toe, by taping the broken toe to the toe next to it. This "buddy taping" will keep the injured toe from moving beyond normal range of motion. Buddy taping also helps the toe heal in a more normal alignment. It may take 6 to 8 weeks for the toe injury to heal.  HOME CARE INSTRUCTIONS   · Leave your toes taped together for as long as directed by your caregiver or until you see a doctor for a follow-up examination. You can change the tape after bathing. Always use a small piece of gauze or cotton between the toes when taping them together. This will help the skin stay dry and prevent infection.  · Apply ice to the injury for 15 to 20 minutes each hour while awake for the first 2 days. Put the ice in a plastic bag and place a towel between the bag of ice and your skin.  · After the first 2 days, apply heat to the injured area. Use heat for the next 2 to 3 days. Place a heating pad on the foot or soak the foot in warm water as directed by your caregiver.  · Keep your foot elevated as much as possible to lessen swelling.  · Wear sturdy, supportive shoes. The shoes should not pinch the toes or fit tightly against the toes.  · Your caregiver may prescribe a rigid shoe if your foot is very swollen.  · Your may be given crutches if the pain is too great and it hurts too much to walk.  · Only take over-the-counter or prescription medicines for pain, discomfort, or fever as directed by your caregiver.  · If your caregiver has given you a follow-up appointment, it is very important to keep that appointment. Not keeping the appointment could result in a chronic or permanent injury, pain, and disability. If there is any problem keeping the appointment, you must call back to this facility for assistance.  SEEK MEDICAL CARE IF:   · You have increased pain or  swelling, not relieved with medications.  · The pain does not get better after 1 week.  · Your injured toe is cold when the others are warm.  SEEK IMMEDIATE MEDICAL CARE IF:   · The toe becomes cold, numb, or white.  · The toe becomes hot (inflamed) and red.  Document Released: 06/23/2000 Document Revised: 06/15/2011 Document Reviewed: 02/10/2008  [employer]® Patient Information ©2012 [employer].

## 2011-12-30 ENCOUNTER — Encounter (HOSPITAL_COMMUNITY): Payer: Self-pay

## 2011-12-30 ENCOUNTER — Emergency Department (HOSPITAL_COMMUNITY)
Admission: EM | Admit: 2011-12-30 | Discharge: 2011-12-30 | Disposition: A | Discharge status: Home / Self Care | Payer: BC Managed Care – PPO | Attending: Emergency Medicine | Admitting: Emergency Medicine

## 2011-12-30 DIAGNOSIS — F17200 Nicotine dependence, unspecified, uncomplicated: Secondary | ICD-10-CM | POA: Insufficient documentation

## 2011-12-30 DIAGNOSIS — E7800 Pure hypercholesterolemia, unspecified: Secondary | ICD-10-CM | POA: Insufficient documentation

## 2011-12-30 DIAGNOSIS — I10 Essential (primary) hypertension: Secondary | ICD-10-CM | POA: Insufficient documentation

## 2011-12-30 DIAGNOSIS — G43909 Migraine, unspecified, not intractable, without status migrainosus: Secondary | ICD-10-CM | POA: Insufficient documentation

## 2011-12-30 MED ORDER — PROMETHAZINE HCL 25 MG PO TABS: 25.0000 mg | ORAL_TABLET | Freq: Four times a day (QID) | ORAL | Status: DC | PRN

## 2011-12-30 MED ORDER — HYDROCODONE-ACETAMINOPHEN 5-325 MG PO TABS: 1.0000 | ORAL_TABLET | Freq: Four times a day (QID) | ORAL | Status: AC | PRN

## 2011-12-30 MED ORDER — PROMETHAZINE HCL 25 MG/ML IJ SOLN
25.0000 mg | Freq: Once | INTRAMUSCULAR | Status: AC
  Administered 2011-12-30: 25 mg via INTRAMUSCULAR
  Filled 2011-12-30: qty 1

## 2011-12-30 MED ORDER — HYDROMORPHONE HCL PF 2 MG/ML IJ SOLN
2.0000 mg | Freq: Once | INTRAMUSCULAR | Status: AC
  Administered 2011-12-30: 2 mg via INTRAMUSCULAR
  Filled 2011-12-30: qty 1

## 2011-12-30 NOTE — ED Notes (Signed)
Complain of migraine for three days

## 2011-12-30 NOTE — Discharge Instructions (Signed)
Return for new or worse symptoms take medication as directed followup with her regular doctor in the next few days.

## 2011-12-30 NOTE — ED Provider Notes (Signed)
History    This chart was scribed for Cynthia Jakes, MD, MD by Smitty Pluck. The patient was seen in room APA19 and the patient's care was started at 12:49PM.   CSN: [id number]  Arrival date & time 12/30/11  4098   First MD Initiated Contact with Patient 12/30/11 1049      Chief Complaint  Patient presents with  . Migraine    (Consider location/radiation/quality/duration/timing/severity/associated sxs/prior treatment) Patient is a 61 y.o. female presenting with migraine. The history is provided by the patient.  Migraine Pertinent negatives include no chest pain, no abdominal pain and no shortness of breath.   Cynthia [name] Cynthia [name] is a 61 y.o. female who presents to the Emergency Department complaining of moderate migraine onset 3 days ago. Pain is located on right side. Pt reports hx of migraines and this one feels similar. Pt usually gets dilaudid injection while in ED. Symptoms have been constant since onset. Pt reports having visual change of seeing dots. There is no radiation.  PCP is Dr. Loleta Chance  Past Medical History  Diagnosis Date  . Migraine   . Hypertension   . Bronchitis   . High blood cholesterol level     Past Surgical History  Procedure Date  . Left arm     Family History  Problem Relation Age of Onset  . Heart failure Father   . Hypertension Father   . Stroke Father     History  Substance Use Topics  . Smoking status: Current Everyday Smoker -- 0.5 packs/day for 28 years    Types: Cigarettes  . Smokeless tobacco: Not on file  . Alcohol Use: No    OB History    Grav Para Term Preterm Abortions TAB SAB Ect Mult Living   2 1   1            Review of Systems  Constitutional: Negative for fever and chills.  HENT: Negative for congestion and neck pain.   Eyes: Positive for visual disturbance.  Respiratory: Negative for cough and shortness of breath.   Cardiovascular: Negative for chest pain.  Gastrointestinal: Negative for nausea, vomiting and  abdominal pain.  Musculoskeletal: Negative for back pain.  Skin: Negative for rash.    Allergies  Aspirin  Home Medications   Current Outpatient Rx  Name Route Sig Dispense Refill  . BUTALBITAL-APAP-CAFF-COD 50-325-40-30 MG PO CAPS Oral Take 1 capsule by mouth every 6 (six) hours as needed.      . IRON 28 MG PO TABS Oral Take 1 tablet by mouth 1 day or 1 dose.      Marland Kitchen GUAIFENESIN-CODEINE 100-10 MG/5ML PO SYRP  10 ml q 4-6 hrs prn cough 240 mL 0  . HYDROCODONE-ACETAMINOPHEN 5-325 MG PO TABS Oral Take 1-2 tablets by mouth every 6 (six) hours as needed for pain. 10 tablet 0  . LISINOPRIL-HYDROCHLOROTHIAZIDE 10-12.5 MG PO TABS Oral Take 1 tablet by mouth daily.      Marland Kitchen NAPROXEN-ESOMEPRAZOLE 500-20 MG PO TBEC Oral Take 1 tablet by mouth 1 day or 1 dose.      Marland Kitchen OVER THE COUNTER MEDICATION Oral Take 1 tablet by mouth 1 day or 1 dose.      Marland Kitchen PROMETHAZINE HCL 25 MG PO TABS Oral Take 1 tablet (25 mg total) by mouth every 6 (six) hours as needed for nausea. 12 tablet 0  . SIMVASTATIN 20 MG PO TABS Oral Take 20 mg by mouth at bedtime.        BP 122/81  Pulse 104  Temp 98.3 F (36.8 C) (Oral)  Resp 20  Ht 5\' 4"  (1.626 m)  Wt 119 lb (53.978 kg)  BMI 20.43 kg/m2  SpO2 100%  Physical Exam  Nursing note and vitals reviewed. Constitutional: She is oriented to person, place, and time. She appears well-developed and well-nourished. No distress.  HENT:  Head: Normocephalic and atraumatic.  Mouth/Throat: Oropharynx is clear and moist.  Cardiovascular: Normal rate, regular rhythm and normal heart sounds.   No murmur heard. Pulmonary/Chest: Effort normal and breath sounds normal. No respiratory distress.  Abdominal: Soft. Bowel sounds are normal. She exhibits no distension. There is no tenderness. There is no rebound.  Musculoskeletal: She exhibits no tenderness.  Lymphadenopathy:    She has no cervical adenopathy.  Neurological: She is alert and oriented to person, place, and time. No cranial  nerve deficit. Coordination normal.  Skin: Skin is warm and dry.  Psychiatric: She has a normal mood and affect. Her behavior is normal.    ED Course  Procedures (including critical care time) DIAGNOSTIC STUDIES: Oxygen Saturation is 100% on room air, normal by my interpretation.    COORDINATION OF CARE: 12:51PM EDP discusses pt ED treatment with pt.    Labs Reviewed - No data to display No results found.   1. Migraine       MDM  Patient symptoms are consistent with typical migraine for her just worse. No significant focal findings improved in the emergency department with IM to lauded and IM Phenergan discharged home normally she takes furosemide with codeine for her migraines we will syndrome with some hydrocodone Phenergan in the meantime incision contact her primary care doctor.  I personally performed the services described in this documentation, which was scribed in my presence. The recorded information has been reviewed and considered.         Cynthia Jakes, MD 12/30/11 [phone]

## 2011-12-30 NOTE — ED Notes (Signed)
Pt requesting pain medication. Aplogised and explained to pt that an emergency came in and the EMD had been tied. Explained that EMD would see pt as soon as possible. NAD. Pt states she ran out of her pain medication 2 days ago. States pain to head, denies Nausea or vomiting. Patient advocate also making frequent rounds to pt room.

## 2011-12-30 NOTE — ED Notes (Signed)
Migraine x 3 days. Denies N/V.

## 2012-06-25 ENCOUNTER — Encounter (HOSPITAL_COMMUNITY): Payer: Self-pay | Admitting: Emergency Medicine

## 2012-06-25 ENCOUNTER — Emergency Department (HOSPITAL_COMMUNITY)
Admission: EM | Admit: 2012-06-25 | Discharge: 2012-06-25 | Disposition: A | Discharge status: Home / Self Care | Payer: Self-pay | Attending: Emergency Medicine | Admitting: Emergency Medicine

## 2012-06-25 DIAGNOSIS — I10 Essential (primary) hypertension: Secondary | ICD-10-CM | POA: Insufficient documentation

## 2012-06-25 DIAGNOSIS — Z8709 Personal history of other diseases of the respiratory system: Secondary | ICD-10-CM | POA: Insufficient documentation

## 2012-06-25 DIAGNOSIS — Z79899 Other long term (current) drug therapy: Secondary | ICD-10-CM | POA: Insufficient documentation

## 2012-06-25 DIAGNOSIS — E7800 Pure hypercholesterolemia, unspecified: Secondary | ICD-10-CM | POA: Insufficient documentation

## 2012-06-25 DIAGNOSIS — M542 Cervicalgia: Secondary | ICD-10-CM | POA: Insufficient documentation

## 2012-06-25 DIAGNOSIS — F17200 Nicotine dependence, unspecified, uncomplicated: Secondary | ICD-10-CM | POA: Insufficient documentation

## 2012-06-25 DIAGNOSIS — R110 Nausea: Secondary | ICD-10-CM | POA: Insufficient documentation

## 2012-06-25 DIAGNOSIS — G43909 Migraine, unspecified, not intractable, without status migrainosus: Secondary | ICD-10-CM | POA: Insufficient documentation

## 2012-06-25 MED ORDER — PROMETHAZINE HCL 25 MG PO TABS: 25.0000 mg | ORAL_TABLET | Freq: Four times a day (QID) | ORAL | Status: DC | PRN

## 2012-06-25 MED ORDER — ONDANSETRON 8 MG PO TBDP
8.0000 mg | ORAL_TABLET | Freq: Once | ORAL | Status: AC
  Administered 2012-06-25: 8 mg via ORAL
  Filled 2012-06-25: qty 1

## 2012-06-25 MED ORDER — MORPHINE SULFATE 10 MG/ML IJ SOLN
10.0000 mg | Freq: Once | INTRAMUSCULAR | Status: AC
  Administered 2012-06-25: 10 mg via INTRAMUSCULAR
  Filled 2012-06-25: qty 1

## 2012-06-25 MED ORDER — OXYCODONE-ACETAMINOPHEN 5-325 MG PO TABS: 1.0000 | ORAL_TABLET | Freq: Four times a day (QID) | ORAL | Status: DC | PRN

## 2012-06-25 NOTE — ED Provider Notes (Signed)
History   This chart was scribed for Cynthia Hutching, MD by Sofie Rower, ED Scribe. The patient was seen in room APA15/APA15 and the patient's care was started at 7:05PM.     CSN: [id number]  Arrival date & time 06/25/12  0619   First MD Initiated Contact with Patient 06/25/12 [phone]      Chief Complaint  Patient presents with  . Migraine    (Consider location/radiation/quality/duration/timing/severity/associated sxs/prior treatment) The history is provided by the patient. No language interpreter was used.    Cynthia [name] Cynthia [name] is a 61 y.o. female , with a hx of migraine headahces, who presents to the Emergency Department complaining of sudden, progressively worsening, migraine headache, located at the right temporal region of her head, radiating downwards towards the right side of the neck, onset one week ago. Associated symptoms include nausea. The pt reports she has experienced migraine headaches in the past, however, the headache she is experiencing today is much more severe in comparison. The pt has taken goodies headache powder which does not provide relief of the migraine headache.  The pt denies any episodes of vomiting.   The pt is a current everyday smoker (0.5 packs/day), however, she does not drink alcohol.   PCP is Dr. Loleta Chance.    Past Medical History  Diagnosis Date  . Migraine   . Hypertension   . Bronchitis   . High blood cholesterol level     Past Surgical History  Procedure Date  . Left arm     Family History  Problem Relation Age of Onset  . Heart failure Father   . Hypertension Father   . Stroke Father     History  Substance Use Topics  . Smoking status: Current Every Day Smoker -- 0.5 packs/day for 28 years    Types: Cigarettes  . Smokeless tobacco: Not on file  . Alcohol Use: No    OB History    Grav Para Term Preterm Abortions TAB SAB Ect Mult Living   2 1   1            Review of Systems  Gastrointestinal: Positive for nausea. Negative for  vomiting.  Neurological: Positive for headaches.  All other systems reviewed and are negative.    Allergies  Aspirin  Home Medications   Current Outpatient Rx  Name  Route  Sig  Dispense  Refill  . IRON 28 MG PO TABS   Oral   Take 1 tablet by mouth 1 day or 1 dose.           Marland Kitchen LISINOPRIL-HYDROCHLOROTHIAZIDE 10-12.5 MG PO TABS   Oral   Take 1 tablet by mouth daily.           Marland Kitchen NAPROXEN-ESOMEPRAZOLE 500-20 MG PO TBEC   Oral   Take 1 tablet by mouth 1 day or 1 dose.           Marland Kitchen SIMVASTATIN 20 MG PO TABS   Oral   Take 20 mg by mouth at bedtime.           Marland Kitchen BUTALBITAL-APAP-CAFF-COD 50-325-40-30 MG PO CAPS   Oral   Take 1 capsule by mouth every 6 (six) hours as needed.           . GUAIFENESIN-CODEINE 100-10 MG/5ML PO SYRP      10 ml q 4-6 hrs prn cough   240 mL   0   . OVER THE COUNTER MEDICATION   Oral   Take 1 tablet by mouth  1 day or 1 dose.           Marland Kitchen PROMETHAZINE HCL 25 MG PO TABS   Oral   Take 1 tablet (25 mg total) by mouth every 6 (six) hours as needed for nausea.   12 tablet   0     BP 150/82  Pulse 82  Temp 97.9 F (36.6 C) (Oral)  Resp 18  Ht 5\' 5"  (1.651 m)  Wt 120 lb (54.432 kg)  BMI 19.97 kg/m2  SpO2 97%  Physical Exam  Nursing note and vitals reviewed. Constitutional: She is oriented to person, place, and time. She appears well-developed and well-nourished.  HENT:  Head: Normocephalic and atraumatic.  Eyes: Conjunctivae normal and EOM are normal. Pupils are equal, round, and reactive to light.  Neck: Normal range of motion. Neck supple.  Cardiovascular: Normal rate, regular rhythm and normal heart sounds.   Pulmonary/Chest: Effort normal and breath sounds normal.  Abdominal: Soft. Bowel sounds are normal.  Musculoskeletal: Normal range of motion.  Neurological: She is alert and oriented to person, place, and time.  Skin: Skin is warm and dry.  Psychiatric: She has a normal mood and affect.    ED Course  Procedures  (including critical care time)  DIAGNOSTIC STUDIES: Oxygen Saturation is 97% on room air, normal by my interpretation.    COORDINATION OF CARE:  7:10 AM- Treatment plan concerning pain management (IM morphine) discussed with patient. Pt agrees with treatment.      Labs Reviewed - No data to display No results found.   No diagnosis found.    MDM  History and physical consistent with uncomplicated migraine..  Rx IM morphine and by mouth Zofran.  Discharge meds Percocet #15 and Phenergan 25 mg #15       I personally performed the services described in this documentation, which was scribed in my presence. The recorded information has been reviewed and is accurate.    Cynthia Hutching, MD 06/25/12 [phone]

## 2012-06-25 NOTE — ED Notes (Signed)
Patient c/o migraine x 1 week; states has been taking Goody powder without relief.  Patient denies any nausea or vomiting.

## 2013-01-08 ENCOUNTER — Encounter (HOSPITAL_COMMUNITY): Payer: Self-pay | Admitting: Emergency Medicine

## 2013-01-08 ENCOUNTER — Emergency Department (HOSPITAL_COMMUNITY): Payer: Medicaid Other

## 2013-01-08 ENCOUNTER — Inpatient Hospital Stay (HOSPITAL_COMMUNITY)
Admission: EM | Admit: 2013-01-08 | Discharge: 2013-01-20 | DRG: 207 | Disposition: A | Discharge status: Home Health | Payer: Medicaid Other | Attending: Internal Medicine | Admitting: Internal Medicine

## 2013-01-08 DIAGNOSIS — E782 Mixed hyperlipidemia: Secondary | ICD-10-CM | POA: Diagnosis present

## 2013-01-08 DIAGNOSIS — I5033 Acute on chronic diastolic (congestive) heart failure: Secondary | ICD-10-CM | POA: Diagnosis not present

## 2013-01-08 DIAGNOSIS — F17200 Nicotine dependence, unspecified, uncomplicated: Secondary | ICD-10-CM

## 2013-01-08 DIAGNOSIS — G43909 Migraine, unspecified, not intractable, without status migrainosus: Secondary | ICD-10-CM | POA: Diagnosis present

## 2013-01-08 DIAGNOSIS — J42 Unspecified chronic bronchitis: Secondary | ICD-10-CM

## 2013-01-08 DIAGNOSIS — R000 Tachycardia, unspecified: Secondary | ICD-10-CM

## 2013-01-08 DIAGNOSIS — I509 Heart failure, unspecified: Secondary | ICD-10-CM | POA: Diagnosis not present

## 2013-01-08 DIAGNOSIS — D72829 Elevated white blood cell count, unspecified: Secondary | ICD-10-CM

## 2013-01-08 DIAGNOSIS — I498 Other specified cardiac arrhythmias: Secondary | ICD-10-CM | POA: Diagnosis present

## 2013-01-08 DIAGNOSIS — J9601 Acute respiratory failure with hypoxia: Secondary | ICD-10-CM

## 2013-01-08 DIAGNOSIS — M7150 Other bursitis, not elsewhere classified, unspecified site: Secondary | ICD-10-CM

## 2013-01-08 DIAGNOSIS — J189 Pneumonia, unspecified organism: Principal | ICD-10-CM

## 2013-01-08 DIAGNOSIS — G4700 Insomnia, unspecified: Secondary | ICD-10-CM | POA: Diagnosis not present

## 2013-01-08 DIAGNOSIS — J9600 Acute respiratory failure, unspecified whether with hypoxia or hypercapnia: Secondary | ICD-10-CM | POA: Diagnosis not present

## 2013-01-08 DIAGNOSIS — M129 Arthropathy, unspecified: Secondary | ICD-10-CM

## 2013-01-08 DIAGNOSIS — G9340 Encephalopathy, unspecified: Secondary | ICD-10-CM | POA: Diagnosis not present

## 2013-01-08 DIAGNOSIS — I10 Essential (primary) hypertension: Secondary | ICD-10-CM | POA: Diagnosis present

## 2013-01-08 DIAGNOSIS — D649 Anemia, unspecified: Secondary | ICD-10-CM

## 2013-01-08 DIAGNOSIS — E785 Hyperlipidemia, unspecified: Secondary | ICD-10-CM

## 2013-01-08 DIAGNOSIS — Z79899 Other long term (current) drug therapy: Secondary | ICD-10-CM

## 2013-01-08 DIAGNOSIS — J301 Allergic rhinitis due to pollen: Secondary | ICD-10-CM

## 2013-01-08 DIAGNOSIS — E870 Hyperosmolality and hypernatremia: Secondary | ICD-10-CM | POA: Diagnosis not present

## 2013-01-08 LAB — CBC WITH DIFFERENTIAL/PLATELET
Basophils Absolute: 0 10*3/uL (ref 0.0–0.1)
Lymphocytes Relative: 18 % (ref 12–46)
Neutro Abs: 11.5 10*3/uL — ABNORMAL HIGH (ref 1.7–7.7)
Neutrophils Relative %: 75 % (ref 43–77)
Platelets: 224 10*3/uL (ref 150–400)
RDW: 14.8 % (ref 11.5–15.5)
WBC: 15.3 10*3/uL — ABNORMAL HIGH (ref 4.0–10.5)

## 2013-01-08 LAB — BASIC METABOLIC PANEL
Calcium: 10 mg/dL (ref 8.4–10.5)
Creatinine, Ser: 0.77 mg/dL (ref 0.50–1.10)
GFR calc Af Amer: >90 mL/min (ref >90)

## 2013-01-08 LAB — EXPECTORATED SPUTUM ASSESSMENT W GRAM STAIN, RFLX TO RESP C

## 2013-01-08 LAB — STREP PNEUMONIAE URINARY ANTIGEN: Strep Pneumo Urinary Antigen: NEGATIVE

## 2013-01-08 LAB — TROPONIN I: Troponin I: <0.3 ng/mL (ref <0.30)

## 2013-01-08 MED ORDER — DEXTROSE 5 % IV SOLN
500.0000 mg | INTRAVENOUS | Status: DC
  Administered 2013-01-09 – 2013-01-10 (×2): 500 mg via INTRAVENOUS
  Filled 2013-01-08 (×5): qty 500

## 2013-01-08 MED ORDER — PREDNISONE 20 MG PO TABS
40.0000 mg | ORAL_TABLET | Freq: Every day | ORAL | Status: DC
  Administered 2013-01-09 – 2013-01-11 (×3): 40 mg via ORAL
  Filled 2013-01-08 (×4): qty 2

## 2013-01-08 MED ORDER — ALBUTEROL SULFATE (5 MG/ML) 0.5% IN NEBU
10.0000 mg | INHALATION_SOLUTION | Freq: Once | RESPIRATORY_TRACT | Status: AC
  Administered 2013-01-08: 10 mg via RESPIRATORY_TRACT
  Filled 2013-01-08: qty 2

## 2013-01-08 MED ORDER — ENOXAPARIN SODIUM 40 MG/0.4ML ~~LOC~~ SOLN
40.0000 mg | SUBCUTANEOUS | Status: DC
  Administered 2013-01-08 – 2013-01-20 (×13): 40 mg via SUBCUTANEOUS
  Filled 2013-01-08 (×13): qty 0.4

## 2013-01-08 MED ORDER — ALBUTEROL SULFATE (5 MG/ML) 0.5% IN NEBU
2.5000 mg | INHALATION_SOLUTION | Freq: Once | RESPIRATORY_TRACT | Status: AC
  Administered 2013-01-08: 2.5 mg via RESPIRATORY_TRACT
  Filled 2013-01-08: qty 0.5

## 2013-01-08 MED ORDER — OXYCODONE-ACETAMINOPHEN 5-325 MG PO TABS
1.0000 | ORAL_TABLET | Freq: Four times a day (QID) | ORAL | Status: DC | PRN
  Administered 2013-01-08 – 2013-01-09 (×2): 2 via ORAL
  Administered 2013-01-09: 1 via ORAL
  Administered 2013-01-09 – 2013-01-10 (×6): 2 via ORAL
  Filled 2013-01-08 (×9): qty 2

## 2013-01-08 MED ORDER — ALUM & MAG HYDROXIDE-SIMETH 200-200-20 MG/5ML PO SUSP
30.0000 mL | Freq: Four times a day (QID) | ORAL | Status: DC | PRN
  Filled 2013-01-08: qty 30

## 2013-01-08 MED ORDER — ALBUTEROL SULFATE (5 MG/ML) 0.5% IN NEBU
2.5000 mg | INHALATION_SOLUTION | Freq: Four times a day (QID) | RESPIRATORY_TRACT | Status: DC
  Administered 2013-01-08 – 2013-01-10 (×9): 2.5 mg via RESPIRATORY_TRACT
  Filled 2013-01-08 (×9): qty 0.5

## 2013-01-08 MED ORDER — DEXTROSE 5 % IV SOLN
1.0000 g | Freq: Once | INTRAVENOUS | Status: AC
  Administered 2013-01-08: 1 g via INTRAVENOUS
  Filled 2013-01-08: qty 10

## 2013-01-08 MED ORDER — GUAIFENESIN-CODEINE 100-10 MG/5ML PO SOLN
2.5000 mL | ORAL | Status: DC | PRN
  Administered 2013-01-08 – 2013-01-11 (×11): 2.5 mL via ORAL
  Filled 2013-01-08 (×11): qty 5

## 2013-01-08 MED ORDER — AZITHROMYCIN 250 MG PO TABS
500.0000 mg | ORAL_TABLET | Freq: Once | ORAL | Status: AC
  Administered 2013-01-08: 500 mg via ORAL
  Filled 2013-01-08: qty 2

## 2013-01-08 MED ORDER — OXYCODONE-ACETAMINOPHEN 5-325 MG PO TABS
1.0000 | ORAL_TABLET | Freq: Once | ORAL | Status: AC
  Administered 2013-01-08: 1 via ORAL
  Filled 2013-01-08: qty 1

## 2013-01-08 MED ORDER — ALBUTEROL SULFATE (5 MG/ML) 0.5% IN NEBU
5.0000 mg | INHALATION_SOLUTION | Freq: Once | RESPIRATORY_TRACT | Status: AC
  Administered 2013-01-08: 5 mg via RESPIRATORY_TRACT
  Filled 2013-01-08: qty 1

## 2013-01-08 MED ORDER — IPRATROPIUM BROMIDE 0.02 % IN SOLN
0.5000 mg | Freq: Once | RESPIRATORY_TRACT | Status: AC
  Administered 2013-01-08: 0.5 mg via RESPIRATORY_TRACT
  Filled 2013-01-08: qty 2.5

## 2013-01-08 MED ORDER — ONDANSETRON HCL 4 MG PO TABS: 4.0000 mg | ORAL_TABLET | Freq: Four times a day (QID) | ORAL | Status: DC | PRN

## 2013-01-08 MED ORDER — SODIUM CHLORIDE 0.9 % IV SOLN
INTRAVENOUS | Status: DC
  Administered 2013-01-08 – 2013-01-13 (×8): via INTRAVENOUS
  Administered 2013-01-15: 500 mL via INTRAVENOUS

## 2013-01-08 MED ORDER — PREDNISONE 50 MG PO TABS
60.0000 mg | ORAL_TABLET | Freq: Once | ORAL | Status: AC
  Administered 2013-01-08: 60 mg via ORAL
  Filled 2013-01-08: qty 1

## 2013-01-08 MED ORDER — DEXTROSE 5 % IV SOLN
1.0000 g | INTRAVENOUS | Status: DC
  Administered 2013-01-09 – 2013-01-10 (×2): 1 g via INTRAVENOUS
  Filled 2013-01-08 (×5): qty 10

## 2013-01-08 MED ORDER — PNEUMOCOCCAL VAC POLYVALENT 25 MCG/0.5ML IJ INJ
0.5000 mL | INJECTION | INTRAMUSCULAR | Status: AC
  Administered 2013-01-09: 0.5 mL via INTRAMUSCULAR
  Filled 2013-01-08: qty 0.5

## 2013-01-08 MED ORDER — ONDANSETRON HCL 4 MG/2ML IJ SOLN: 4.0000 mg | Freq: Four times a day (QID) | INTRAMUSCULAR | Status: DC | PRN

## 2013-01-08 MED ORDER — HYDROCODONE-ACETAMINOPHEN 5-325 MG PO TABS
1.0000 | ORAL_TABLET | ORAL | Status: DC | PRN
  Administered 2013-01-08 (×2): 1 via ORAL
  Filled 2013-01-08 (×2): qty 1

## 2013-01-08 MED ORDER — ACETAMINOPHEN 325 MG PO TABS
650.0000 mg | ORAL_TABLET | Freq: Four times a day (QID) | ORAL | Status: DC | PRN
  Administered 2013-01-08: 650 mg via ORAL
  Filled 2013-01-08: qty 2

## 2013-01-08 MED ORDER — IPRATROPIUM BROMIDE 0.02 % IN SOLN
0.5000 mg | Freq: Four times a day (QID) | RESPIRATORY_TRACT | Status: DC
  Administered 2013-01-08 – 2013-01-10 (×9): 0.5 mg via RESPIRATORY_TRACT
  Filled 2013-01-08 (×8): qty 2.5

## 2013-01-08 MED ORDER — HYDROMORPHONE HCL PF 1 MG/ML IJ SOLN
0.5000 mg | INTRAMUSCULAR | Status: DC | PRN
  Administered 2013-01-09 – 2013-01-11 (×2): 0.5 mg via INTRAVENOUS
  Filled 2013-01-08 (×2): qty 1

## 2013-01-08 MED ORDER — BIOTENE DRY MOUTH MT LIQD
15.0000 mL | Freq: Two times a day (BID) | OROMUCOSAL | Status: DC
  Administered 2013-01-08 – 2013-01-11 (×6): 15 mL via OROMUCOSAL

## 2013-01-08 MED ORDER — ACETAMINOPHEN 650 MG RE SUPP: 650.0000 mg | Freq: Four times a day (QID) | RECTAL | Status: DC | PRN

## 2013-01-08 MED ORDER — SIMVASTATIN 20 MG PO TABS
20.0000 mg | ORAL_TABLET | Freq: Every day | ORAL | Status: DC
  Administered 2013-01-08 – 2013-01-19 (×12): 20 mg via ORAL
  Filled 2013-01-08 (×13): qty 1

## 2013-01-08 NOTE — ED Notes (Signed)
Respiratory in room with a breathing treatment

## 2013-01-08 NOTE — H&P (Signed)
[facility] History and Physical  [name]:[id number] DOB: 1950/10/23 DOA: 01/08/2013  Referring physician:  PCP: Evlyn Courier, MD  Specialists:   Chief Complaint: Cough  HPI: Cynthia [name] is a 62 y.o. female with past medical history that includes hypertension, bronchitis, migraine presents to the emergency room with a chief complaint of cough and shortness of breath. Information is obtained from the patient. She reports that 2 days ago she developed some sinus congestion. One day ago she developed cough nonproductive as well as wheezing with shortness of breath on exertion. She denies any chest pain palpitation syncope near syncope. She states that she used her home inhaler without relief. Associated symptoms include subjective fever with chills general malaise and decreased appetite. She denies any orthopnea or lower extremity edema. She denies any abdominal pain but does report some nausea with one episode of clear emesis. She denies diarrhea constipation, melena, dysuria hematuria frequency or urgency. Lab work in the emergency room is significant for a white count of 15.3 and proBNP 247. Chest x-ray yields diffuse interstitial pattern suggesting atypical infections/mycoplasma. Vital signs significant for an oral temperature of 99.9 a heart rate of 118 respiratory rate of 22. Oxygen saturation level 97% on 2 L of oxygen however when ambulated in the emergency room on room air her oxygen saturation level dropped to 88%. It rose to 96% after rest and oxygen supplementation. Symptoms came on suddenly have persisted and worsened. Symptoms characterized as moderate. [facility] asked to admit   Review of Systems: The patient denies  weight loss,, vision loss, decreased hearing, hoarseness, chest pain, syncope,  peripheral edema, balance deficits, hemoptysis, abdominal pain, melena, hematochezia, severe indigestion/heartburn, hematuria, incontinence, genital sores, muscle  weakness, suspicious skin lesions, transient blindness, difficulty walking, depression, unusual weight change, abnormal bleeding, enlarged lymph nodes, angioedema, and breast masses.    Past Medical History  Diagnosis Date  . Migraine   . Hypertension   . Bronchitis   . High blood cholesterol level    Past Surgical History  Procedure Laterality Date  . Left arm     Social History:  reports that she has been smoking Cigarettes.  She has a 20 (40 years 1/2 pack per day) pack-year smoking history. She has never used smokeless tobacco. She reports that she does not drink alcohol or use illicit drugs. Patient is married and lives with her husband. She is currently unemployed as she provides care for her husband who is disabled  Allergies  Allergen Reactions  . Aspirin     REACTION: Stomach upset    Family History  Problem Relation Age of Onset  . Heart failure Father   . Hypertension Father   . Stroke Father     Prior to Admission medications   Medication Sig Start Date End Date Taking? Authorizing Provider  butalbital-acetaminophen-caffeine (FIORICET WITH CODEINE) 50-325-40-30 MG per capsule Take 1 capsule by mouth every 6 (six) hours as needed.      Historical Provider, MD  Ferrous Sulfate (IRON) 28 MG TABS Take 1 tablet by mouth 1 day or 1 dose.      Historical Provider, MD  guaiFENesin-codeine [facility]) 100-10 MG/5ML syrup 10 ml q 4-6 hrs prn cough 05/30/11   Richard Hyacinth Meeker, PA-C  lisinopril-hydrochlorothiazide (PRINZIDE,ZESTORETIC) 10-12.5 MG per tablet Take 1 tablet by mouth daily.      Historical Provider, MD  Naproxen-Esomeprazole 500-20 MG TBEC Take 1 tablet by mouth 1 day or 1 dose.      Historical  Provider, MD  OVER THE COUNTER MEDICATION Take 1 tablet by mouth 1 day or 1 dose.      Historical Provider, MD  oxyCODONE-acetaminophen (PERCOCET/ROXICET) 5-325 MG per tablet Take 1-2 tablets by mouth every 6 (six) hours as needed for pain. 06/25/12   Donnetta Hutching, MD   promethazine (PHENERGAN) 25 MG tablet Take 1 tablet (25 mg total) by mouth every 6 (six) hours as needed for nausea. 12/30/11 01/06/12  Shelda Jakes, MD  promethazine (PHENERGAN) 25 MG tablet Take 1 tablet (25 mg total) by mouth every 6 (six) hours as needed for nausea. 06/25/12   Donnetta Hutching, MD  simvastatin (ZOCOR) 20 MG tablet Take 20 mg by mouth at bedtime.      Historical Provider, MD   Physical Exam: Filed Vitals:   01/08/13 0200 01/08/13 0504 01/08/13 0510 01/08/13 0623  BP: 142/94 118/74  119/75  Pulse: 113   108  Temp:    98.6 F (37 C)  TempSrc:    Oral  Resp:  22  22  Height:    5\' 5"  (1.651 m)  Weight:    53.8 kg (118 lb 9.7 oz)  SpO2:  96% 97% 97%     General:  Well-nourished alert ill appearing  Eyes: PE RRL, EOMI  ENT: Ears clear no nasal drainage oropharynx without erythema or exudate. Mucous membranes of her mouth are pink slightly dry  Neck: Supple no lymphadenopathy  Cardiovascular: Tachycardic but regular no murmur gallop or rub no lower extremity edema pedal pulses present and palpable  Respiratory: Moderate increased work of breathing with conversation. Frequent vigorous dry cough. Breath sounds distant and left face with faint expiratory wheeze no rhonchi no crackles  Abdomen: Soft positive bowel sounds nontender to palpation no mass organomegaly noted  Skin: Warm and dry no rash no lesion  Musculoskeletal: No clubbing no cyanosis  Psychiatric: Calm cooperative  Neurologic: Cranial nerves II through XII grossly intact speech clear facial symmetry  Labs on Admission:  Basic Metabolic Panel:  Recent Labs Lab 01/08/13 0217  NA 135  K 3.7  CL 98  CO2 25  GLUCOSE 135*  BUN 13  CREATININE 0.77  CALCIUM 10.0   CBC:  Recent Labs Lab 01/08/13 0217  WBC 15.3*  NEUTROABS 11.5*  HGB 11.1*  HCT 33.3*  MCV 84.9  PLT 224   Cardiac Enzymes:  Recent Labs Lab 01/08/13 0217  TROPONINI <0.30    BNP (last 3 results)  Recent Labs   01/08/13 0217  PROBNP 247.0*   Radiological Exams on Admission: Dg Chest 2 View  01/08/2013   *RADIOLOGY REPORT*  Clinical Data: Cough.  Nasal congestion.  Wheezing.  CHEST - 2 VIEW  Comparison: 05/30/2011.  Findings: Cardiopericardial silhouette upper limits of normal for projection.  There is new diffuse interstitial prominence present with right basilar distribution.  The findings suggest interstitial pulmonary edema.  In a patient with fever and chills, atypical infection such as mycoplasma can also produce this appearance. There is no focal consolidation.  IMPRESSION: Diffuse interstitial pattern, suggesting atypical infections/mycoplasma in this patient with infectious symptoms.   Original Report Authenticated By: Andreas Newport, M.D.    EKG: Independently reviewed. Sinus tachycardia  Assessment/Plan Principal Problem:   Acute respiratory failure with hypoxia: Again very 2 community-acquired pneumonia. Will admit patient to MedSurg. Will provide oxygen supplementation. Will monitor vital signs with oxygen saturation level every 4 hours. Will provide nebs. Will continue low-dose prednisone daily that was started in the emergency room. Monitor  closely Active Problems:  CAP (community acquired pneumonia): Will continue azithromycin and Rocephin intravenously. Patient is hemodynamically stable. Will support with IV fluids. Will check blood cultures if temp over 101.5. Will check Legionella antigen and strep pneumo. Will check sputum culture.    Leukocytosis, unspecified: Likely related to #2. In addition patient received 60 mg of prednisone in the emergency room. Monitor vital signs every 4 hours x3. Antibiotics as above.    Sinus tachycardia: Likely related to #1 and 2 as well as some mild dehydration do to decreased by mouth intake of the last 2 days. Will support with IV fluids. Will check vital signs every 4 hours x3.   HYPERTENSION: Blood pressure stable. Will hold time antihypertensives  for now. Will monitor every 4 hours x3.    BRONCHITIS, CHRONIC: See #1 and #2.    HYPERLIPIDEMIA: Will check lipid panel continue statin.    MIGRAINE HEADACHE: No recent migraine. Stable at baseline. Will hold home pain meds for now.      Code Status: Full Family Communication: None available Disposition Plan: Home when ready likely 48 hours.  Time spent: 65 minutes.  Gwenyth Bender [facility] Pager [phone]  If 7PM-7AM, please contact night-coverage [email or web address] Password [facility] 01/08/2013, 8:45 AM   I have seen and examined this patient and I agree with the above assessment and plan.

## 2013-01-08 NOTE — Progress Notes (Signed)
UR chart review completed.  

## 2013-01-08 NOTE — ED Notes (Signed)
Patient c/o cough and nasal congestion x 2 days; states has been using Mucinex without relief.

## 2013-01-08 NOTE — ED Notes (Signed)
Ambulated in hall by Jonny Ruiz NT. Pt become mildly short of breath towards the end of ambulation. O2 sats when back were 89%. Pt placed on 2L 02 via [place]. 02 sats now 96%. Dr Bebe Shaggy notified and is in room assessing pt at this time.

## 2013-01-08 NOTE — ED Provider Notes (Signed)
History    CSN: [id number] Arrival date & time 01/08/13  0130  First MD Initiated Contact with Patient 01/08/13 0143     Chief Complaint  Patient presents with  . Cough  . Nasal Congestion  . Wheezing   Patient is a 62 y.o. female presenting with cough. The history is provided by the patient.  Cough Cough characteristics:  Productive Sputum characteristics:  Clear Severity:  Moderate Onset quality:  Sudden Duration:  2 days Timing:  Intermittent Progression:  Worsening Chronicity:  Recurrent Smoker: yes   Relieved by:  Nothing Associated symptoms: chills, shortness of breath, sinus congestion and wheezing   Associated symptoms: no chest pain   pt reports onset of cough, congestion, short of breath and wheezing over two days ago She reports it was sudden onset She reports chills She denies CP She reports similar to previous bronchitis Denies known h/o icu admissions She is a smoker She denies any other known lung disease No new LE edema She denies hemoptysis  Past Medical History  Diagnosis Date  . Migraine   . Hypertension   . Bronchitis   . High blood cholesterol level    Past Surgical History  Procedure Laterality Date  . Left arm     Family History  Problem Relation Age of Onset  . Heart failure Father   . Hypertension Father   . Stroke Father    History  Substance Use Topics  . Smoking status: Current Every Day Smoker -- 0.50 packs/day for 28 years    Types: Cigarettes  . Smokeless tobacco: Not on file  . Alcohol Use: No   OB History   Grav Para Term Preterm Abortions TAB SAB Ect Mult Living   2 1   1           Review of Systems  Constitutional: Positive for chills.  Respiratory: Positive for cough, shortness of breath and wheezing.   Cardiovascular: Negative for chest pain.  All other systems reviewed and are negative.    Allergies  Aspirin  Home Medications   Current Outpatient Rx  Name  Route  Sig  Dispense  Refill  .  butalbital-acetaminophen-caffeine (FIORICET WITH CODEINE) 50-325-40-30 MG per capsule   Oral   Take 1 capsule by mouth every 6 (six) hours as needed.           . Ferrous Sulfate (IRON) 28 MG TABS   Oral   Take 1 tablet by mouth 1 day or 1 dose.           Marland Kitchen guaiFENesin-codeine (ROBITUSSIN AC) 100-10 MG/5ML syrup      10 ml q 4-6 hrs prn cough   240 mL   0   . lisinopril-hydrochlorothiazide (PRINZIDE,ZESTORETIC) 10-12.5 MG per tablet   Oral   Take 1 tablet by mouth daily.           . Naproxen-Esomeprazole 500-20 MG TBEC   Oral   Take 1 tablet by mouth 1 day or 1 dose.           Marland Kitchen OVER THE COUNTER MEDICATION   Oral   Take 1 tablet by mouth 1 day or 1 dose.           . oxyCODONE-acetaminophen (PERCOCET/ROXICET) 5-325 MG per tablet   Oral   Take 1-2 tablets by mouth every 6 (six) hours as needed for pain.   15 tablet   0   . EXPIRED: promethazine (PHENERGAN) 25 MG tablet   Oral   Take  1 tablet (25 mg total) by mouth every 6 (six) hours as needed for nausea.   12 tablet   0   . promethazine (PHENERGAN) 25 MG tablet   Oral   Take 1 tablet (25 mg total) by mouth every 6 (six) hours as needed for nausea.   15 tablet   0   . simvastatin (ZOCOR) 20 MG tablet   Oral   Take 20 mg by mouth at bedtime.            BP 143/79  Pulse 118  Temp(Src) 99.9 F (37.7 C) (Oral)  Resp 22  Ht 5\' 5"  (1.651 m)  Wt 125 lb (56.7 kg)  BMI 20.8 kg/m2  SpO2 100% BP 118/74  Pulse 113  Temp(Src) 99.9 F (37.7 C) (Oral)  Resp 22  Ht 5\' 5"  (1.651 m)  Wt 125 lb (56.7 kg)  BMI 20.8 kg/m2  SpO2 97%  Physical Exam CONSTITUTIONAL: Well developed/well nourished HEAD: Normocephalic/atraumatic EYES: EOMI/PERRL ENMT: Mucous membranes moist, nasal congestion noted NECK: supple no meningeal signs CV: S1/S2 noted, no murmurs/rubs/gallops noted LUNGS: mild tachypnea noted. Coarse BS noted bilaterally with harsh wheeze noted ABDOMEN: soft, nontender, no rebound or guarding GU:no  cva tenderness NEURO: Pt is awake/alert, moves all extremitiesx4 EXTREMITIES: pulses normal, full ROM, no LE edema noted.  No calf tenderness SKIN: warm, color normal PSYCH: no abnormalities of mood noted  ED Course  Procedures  Labs Reviewed  CBC WITH DIFFERENTIAL - Abnormal; Notable for the following:    WBC 15.3 (*)    Hemoglobin 11.1 (*)    HCT 33.3 (*)    Neutro Abs 11.5 (*)    All other components within normal limits  BASIC METABOLIC PANEL  TROPONIN I  PRO B NATRIURETIC PEPTIDE   Dg Chest 2 View  01/08/2013   *RADIOLOGY REPORT*  Clinical Data: Cough.  Nasal congestion.  Wheezing.  CHEST - 2 VIEW  Comparison: 05/30/2011.  Findings: Cardiopericardial silhouette upper limits of normal for projection.  There is new diffuse interstitial prominence present with right basilar distribution.  The findings suggest interstitial pulmonary edema.  In a patient with fever and chills, atypical infection such as mycoplasma can also produce this appearance. There is no focal consolidation.  IMPRESSION: Diffuse interstitial pattern, suggesting atypical infections/mycoplasma in this patient with infectious symptoms.   Original Report Authenticated By: Andreas Newport, M.D.   2:58 AM Pt with harsh wheezing, continued treatments ordered She denies any recent hospitalizations Suspect this is more likely infectious rather than ACS/PE/CHF Will treat for pneumonia.  Will continue to monitor closely  5:32 AM After nebs, pt still tachypneic with coarse wheezing bilaterally After ambulation she was hypoxic down to 89% She is also diaphoretic While at rest, her resp status improved.   She will need admission due to community acquired pneumonia D/w dr Orvan Falconer, will admit Pt agreeable  MDM  Nursing notes including past medical history and social history reviewed and considered in documentation xrays reviewed and considered Labs/vital reviewed and considered     Date: 01/08/2013  Rate: 110   Rhythm: sinus tachycardia  QRS Axis: normal  Intervals: normal  ST/T Wave abnormalities: nonspecific ST changes  Conduction Disutrbances:none  Narrative Interpretation:   Old EKG Reviewed: none available at time of interpretation       Joya Gaskins, MD 01/08/13 [phone]

## 2013-01-08 NOTE — ED Notes (Addendum)
Entered in error

## 2013-01-08 NOTE — ED Notes (Signed)
States she feels better after meds/nebulizers. Pt now resting in bed, appears comfortable, wheezing still noted however sounds improved over last assessment.

## 2013-01-08 NOTE — Care Management Note (Signed)
    Page 1 of 2   01/20/2013     3:04:18 PM   CARE MANAGEMENT NOTE 01/20/2013  Patient:  Cynthia [name], Cynthia [name]   Account Number:  [id number]  Date Initiated:  01/08/2013  Documentation initiated by:  Sharrie Rothman  Subjective/Objective Assessment:   Pt admitted from home with pneumonia. Pt lives with her husband and is independent with ADL's.     Action/Plan:   Pt may need MATCH voucher at discharge. Financial counselor is aware of self pay status. Will continue to follow.   Anticipated DC Date:  01/20/2013   Anticipated DC Plan:  HOME W HOME HEALTH SERVICES  In-house referral  Financial Counselor      DC Planning Services  MATCH Program  CM consult  [facility]      [facility] Choice  HOME HEALTH   Choice offered to / List presented to:  C-1 Patient        HH arranged  HH-1 RN      [facility] agency  [facility]   Status of service:  Completed, signed off Medicare Important Message given?   (If response is "NO", the following Medicare IM given date fields will be blank) Date Medicare IM given:   Date Additional Medicare IM given:    Discharge Disposition:  HOME W HOME HEALTH SERVICES  Per UR Regulation:  Reviewed for med. necessity/level of care/duration of stay  If discussed at Long Length of Stay Meetings, dates discussed:   01/14/2013  01/16/2013    Comments:  01/08/13 1145 Arlyss Queen, RN BSN CM  01/20/13 15:01 Letha Cape RN, BSN [phone] patient is for dc today, will need HHRN, referral made to [facility], soc will begin 24-48 hrs post discharge.  Patient states she needs ast with medications, will ast with Match Program.  Patient states she has transportation.  Also apt set up with [facility] for July 31, at 10 20 am with Karl Pock.  01-17-13 10:10am Avie Arenas, [phone] PT recommending HH.  patient does live at home with husband.  Husband is at home with a crushed foot from a work related injury.  She has been  caring for him.  Her daughter is now assisting at home with the husband and patient states will be able to assist with her too.  01-16-13 10:40am Avie Arenas, RNBSN [phone] Extubated today - PT/OT ordered.

## 2013-01-08 NOTE — ED Notes (Signed)
resp therapy at bedside for nebulizer treatment

## 2013-01-09 DIAGNOSIS — I498 Other specified cardiac arrhythmias: Secondary | ICD-10-CM

## 2013-01-09 LAB — CBC
Hemoglobin: 9.3 g/dL — ABNORMAL LOW (ref 12.0–15.0)
MCH: 28.1 pg (ref 26.0–34.0)
Platelets: 201 10*3/uL (ref 150–400)
RBC: 3.31 MIL/uL — ABNORMAL LOW (ref 3.87–5.11)
WBC: 17.8 10*3/uL — ABNORMAL HIGH (ref 4.0–10.5)

## 2013-01-09 LAB — COMPREHENSIVE METABOLIC PANEL
AST: 31 U/L (ref 0–37)
BUN: 9 mg/dL (ref 6–23)
CO2: 26 mEq/L (ref 19–32)
Calcium: 8.3 mg/dL — ABNORMAL LOW (ref 8.4–10.5)
Creatinine, Ser: 0.7 mg/dL (ref 0.50–1.10)
GFR calc non Af Amer: >90 mL/min (ref >90)

## 2013-01-09 LAB — LEGIONELLA ANTIGEN, URINE

## 2013-01-09 MED ORDER — ALBUTEROL SULFATE (5 MG/ML) 0.5% IN NEBU
2.5000 mg | INHALATION_SOLUTION | RESPIRATORY_TRACT | Status: DC | PRN
  Administered 2013-01-09 – 2013-01-10 (×2): 2.5 mg via RESPIRATORY_TRACT
  Filled 2013-01-09 (×2): qty 0.5

## 2013-01-09 MED ORDER — ALBUTEROL SULFATE (5 MG/ML) 0.5% IN NEBU
2.5000 mg | INHALATION_SOLUTION | RESPIRATORY_TRACT | Status: DC | PRN
  Administered 2013-01-09: 2.5 mg via RESPIRATORY_TRACT
  Filled 2013-01-09 (×2): qty 0.5

## 2013-01-09 MED ORDER — BISACODYL 10 MG RE SUPP
10.0000 mg | Freq: Every day | RECTAL | Status: DC | PRN
  Administered 2013-01-09: 10 mg via RECTAL
  Filled 2013-01-09: qty 1

## 2013-01-09 MED ORDER — DEXTROSE 5 % IV SOLN
INTRAVENOUS | Status: AC
  Filled 2013-01-09: qty 10

## 2013-01-09 NOTE — Progress Notes (Signed)
[facility] PROGRESS NOTE  ZANYLA KLEBBA ZOX:[id number] DOB: [birth year] DOA: 01/08/2013 PCP: Evlyn Courier, MD  HPI: Cynthia [name] is a 62 y.o. female with past medical history that includes hypertension, bronchitis, migraine presents to the emergency room with a chief complaint of cough and shortness of breath. Chest x-ray yields diffuse interstitial pattern suggesting atypical infections/mycoplasma.   Assessment/Plan: Acute respiratory failure with hypoxia: Due to community-acquired pneumonia.  - on oxygen supplementation, try to wean off as tolerated.  - nebs, prednisone CAP (community acquired pneumonia):  - Ceftriaxone/Azithromycin day 2.  - Legionella antigen pending. Will check sputum culture.  Leukocytosis, unspecified: Likely related to #2. In addition patient received 60 mg of prednisone in the emergency room. Monitor vital signs every 4 hours x3. Antibiotics as above.  Sinus tachycardia: Likely related to #1 and 2 as well as some mild dehydration do to decreased by mouth intake of the last 2 days. HYPERTENSION: Blood pressure stable. Will hold time antihypertensives for now. Will monitor every 4 hours x3.  BRONCHITIS, CHRONIC: See #1 and #2.  HYPERLIPIDEMIA: Will check lipid panel continue statin.  MIGRAINE HEADACHE: No recent migraine. Stable at baseline. Will hold home pain meds for now.  Code Status: Full Family Communication: none today  Disposition Plan: inpatient, home when medically ready  Consultants:  none  Procedures:  none  Antibiotics:  Anti-infectives   Start     Dose/Rate Route Frequency Ordered Stop   01/09/13 0300  azithromycin (ZITHROMAX) 500 mg in dextrose 5 % 250 mL IVPB     500 mg 250 mL/hr over 60 Minutes Intravenous Every 24 hours 01/08/13 0839     01/09/13 0200  cefTRIAXone (ROCEPHIN) 1 g in dextrose 5 % 50 mL IVPB     1 g 100 mL/hr over 30 Minutes Intravenous Every 24 hours 01/08/13 0839     01/08/13 0230  cefTRIAXone  (ROCEPHIN) 1 g in dextrose 5 % 50 mL IVPB     1 g 100 mL/hr over 30 Minutes Intravenous  Once 01/08/13 0217 01/08/13 0326   01/08/13 0230  azithromycin (ZITHROMAX) tablet 500 mg     500 mg Oral  Once 01/08/13 0217 01/08/13 0304     Antibiotics Given (last 72 hours)   Date/Time Action Medication Dose Rate   01/08/13 0304 Given   azithromycin (ZITHROMAX) tablet 500 mg 500 mg    01/09/13 0300 Given   cefTRIAXone (ROCEPHIN) 1 g in dextrose 5 % 50 mL IVPB 1 g 100 mL/hr   01/09/13 0400 Given   azithromycin (ZITHROMAX) 500 mg in dextrose 5 % 250 mL IVPB 500 mg 250 mL/hr     HPI/Subjective: - feeling a bit better, just woke up   Objective: Filed Vitals:   01/09/13 0210 01/09/13 0352 01/09/13 0401 01/09/13 0648  BP: 94/52 116/71    Pulse: 98 114    Temp: 98.6 F (37 C) 98.3 F (36.8 C)    TempSrc: Oral Oral    Resp: 20 20    Height:      Weight:      SpO2: 99% 96% 96% 96%    Intake/Output Summary (Last 24 hours) at 01/09/13 0848 Last data filed at 01/08/13 1930  Gross per 24 hour  Intake    600 ml  Output    500 ml  Net    100 ml   Filed Weights   01/08/13 0140 01/08/13 0623  Weight: 56.7 kg (125 lb) 53.8 kg (118 lb 9.7 oz)  Exam:   General:  NAD  Cardiovascular: regular rate and rhythm, without MRG  Respiratory: coarse breath sounds with minimal wheezing.   Abdomen: soft, not tender to palpation, positive bowel sounds  MSK: no peripheral edema  Neuro: CN 2-12 grossly intact, MS 5/5 in all 4  Data Reviewed: Basic Metabolic Panel:  Recent Labs Lab 01/08/13 0217 01/09/13 0528  NA 135 139  K 3.7 3.5  CL 98 106  CO2 25 26  GLUCOSE 135* 119*  BUN 13 9  CREATININE 0.77 0.70  CALCIUM 10.0 8.3*   Liver Function Tests:  Recent Labs Lab 01/09/13 0528  AST 31  ALT 26  ALKPHOS 47  BILITOT 0.2*  PROT 6.6  ALBUMIN 3.3*   CBC:  Recent Labs Lab 01/08/13 0217 01/09/13 0528  WBC 15.3* 17.8*  NEUTROABS 11.5*  --   HGB 11.1* 9.3*  HCT 33.3*  28.3*  MCV 84.9 85.5  PLT 224 201   Cardiac Enzymes:  Recent Labs Lab 01/08/13 0217  TROPONINI <0.30   BNP (last 3 results)  Recent Labs  01/08/13 0217  PROBNP 247.0*    Recent Results (from the past 240 hour(s))  CULTURE, EXPECTORATED SPUTUM-ASSESSMENT     Status: None   Collection Time    01/08/13  6:55 PM      Result Value Range Status   Specimen Description SPUTUM   Final   Special Requests NONE   Final   Sputum evaluation     Final   Value: THIS SPECIMEN IS ACCEPTABLE. RESPIRATORY CULTURE REPORT TO FOLLOW.   Report Status 01/08/2013 FINAL   Final     Studies: Dg Chest 2 View  01/08/2013   *RADIOLOGY REPORT*  Clinical Data: Cough.  Nasal congestion.  Wheezing.  CHEST - 2 VIEW  Comparison: 05/30/2011.  Findings: Cardiopericardial silhouette upper limits of normal for projection.  There is new diffuse interstitial prominence present with right basilar distribution.  The findings suggest interstitial pulmonary edema.  In a patient with fever and chills, atypical infection such as mycoplasma can also produce this appearance. There is no focal consolidation.  IMPRESSION: Diffuse interstitial pattern, suggesting atypical infections/mycoplasma in this patient with infectious symptoms.   Original Report Authenticated By: Andreas Newport, M.D.    Scheduled Meds: . albuterol  2.5 mg Nebulization Q6H  . antiseptic oral rinse  15 mL Mouth Rinse BID  . azithromycin  500 mg Intravenous Q24H  . cefTRIAXone (ROCEPHIN)  IV  1 g Intravenous Q24H  . enoxaparin (LOVENOX) injection  40 mg Subcutaneous Q24H  . ipratropium  0.5 mg Nebulization Q6H  . pneumococcal 23 valent vaccine  0.5 mL Intramuscular Tomorrow-1000  . predniSONE  40 mg Oral Q breakfast  . simvastatin  20 mg Oral QHS   Continuous Infusions: . sodium chloride 100 mL/hr at 01/09/13 1610    Principal Problem:   Acute respiratory failure with hypoxia Active Problems:   HYPERLIPIDEMIA   MIGRAINE HEADACHE    HYPERTENSION   BRONCHITIS, CHRONIC   CAP (community acquired pneumonia)   Leukocytosis, unspecified   Sinus tachycardia   Time spent: 25  Pamella Pert, MD [facility] Pager [phone]. If 7 PM - 7 AM, please contact night-coverage at [email or web address], password [facility] 01/09/2013, 8:48 AM  LOS: 1 day

## 2013-01-10 ENCOUNTER — Inpatient Hospital Stay (HOSPITAL_COMMUNITY): Payer: Medicaid Other

## 2013-01-10 DIAGNOSIS — G43909 Migraine, unspecified, not intractable, without status migrainosus: Secondary | ICD-10-CM

## 2013-01-10 LAB — BASIC METABOLIC PANEL
Calcium: 9.1 mg/dL (ref 8.4–10.5)
GFR calc Af Amer: >90 mL/min (ref >90)
GFR calc non Af Amer: >90 mL/min (ref >90)
Potassium: 3.7 mEq/L (ref 3.5–5.1)
Sodium: 138 mEq/L (ref 135–145)

## 2013-01-10 LAB — CBC
Hemoglobin: 9.6 g/dL — ABNORMAL LOW (ref 12.0–15.0)
MCH: 28.1 pg (ref 26.0–34.0)
MCHC: 33 g/dL (ref 30.0–36.0)
Platelets: 225 10*3/uL (ref 150–400)
RDW: 15 % (ref 11.5–15.5)

## 2013-01-10 MED ORDER — BUTALBITAL-APAP-CAFFEINE 50-325-40 MG PO TABS
1.0000 | ORAL_TABLET | Freq: Four times a day (QID) | ORAL | Status: DC | PRN
  Administered 2013-01-10 (×3): 1 via ORAL
  Filled 2013-01-10 (×3): qty 1

## 2013-01-10 MED ORDER — ALBUTEROL SULFATE (5 MG/ML) 0.5% IN NEBU
2.5000 mg | INHALATION_SOLUTION | RESPIRATORY_TRACT | Status: DC
  Administered 2013-01-10 (×4): 2.5 mg via RESPIRATORY_TRACT
  Filled 2013-01-10 (×4): qty 0.5

## 2013-01-10 MED ORDER — DEXTROSE 5 % IV SOLN
1.0000 g | Freq: Three times a day (TID) | INTRAVENOUS | Status: DC
  Administered 2013-01-10 – 2013-01-11 (×3): 1 g via INTRAVENOUS
  Filled 2013-01-10 (×10): qty 1

## 2013-01-10 MED ORDER — IPRATROPIUM BROMIDE 0.02 % IN SOLN
0.5000 mg | RESPIRATORY_TRACT | Status: DC
  Administered 2013-01-10 – 2013-01-11 (×6): 0.5 mg via RESPIRATORY_TRACT
  Filled 2013-01-10 (×6): qty 2.5

## 2013-01-10 MED ORDER — FUROSEMIDE 10 MG/ML IJ SOLN
20.0000 mg | Freq: Once | INTRAMUSCULAR | Status: AC
  Administered 2013-01-10: 20 mg via INTRAVENOUS
  Filled 2013-01-10: qty 2

## 2013-01-10 MED ORDER — VANCOMYCIN HCL IN DEXTROSE 750-5 MG/150ML-% IV SOLN
750.0000 mg | Freq: Two times a day (BID) | INTRAVENOUS | Status: DC
  Administered 2013-01-10 – 2013-01-14 (×8): 750 mg via INTRAVENOUS
  Filled 2013-01-10 (×14): qty 150

## 2013-01-10 MED ORDER — LEVOFLOXACIN IN D5W 750 MG/150ML IV SOLN
750.0000 mg | INTRAVENOUS | Status: DC
  Administered 2013-01-10 – 2013-01-11 (×2): 750 mg via INTRAVENOUS
  Filled 2013-01-10 (×4): qty 150

## 2013-01-10 MED ORDER — SODIUM CHLORIDE 0.9 % IJ SOLN
INTRAMUSCULAR | Status: AC
  Administered 2013-01-10: 3 mL
  Filled 2013-01-10: qty 3

## 2013-01-10 NOTE — Progress Notes (Signed)
[facility] PROGRESS NOTE  Cynthia [name] YNW:[id number] DOB: 1950-08-27 DOA: 01/08/2013 PCP: Evlyn Courier, MD  HPI: Cynthia [name] is a 62 y.o. female with past medical history that includes hypertension, bronchitis, migraine presents to the emergency room with a chief complaint of cough and shortness of breath. Chest x-ray yields diffuse interstitial pattern suggesting atypical infections/mycoplasma.   Assessment/Plan: Acute respiratory failure with hypoxia: Due to community-acquired pneumonia.  - on oxygen supplementation, try to wean off as tolerated.  - nebs, prednisone - repeat CXR 7/4 for worsening SOB - stop IVF since good po intake.  CAP (community acquired pneumonia):  - Ceftriaxone/Azithromycin day 3.  - microbiology negative Leukocytosis, unspecified:related to steroids.   Sinus tachycardia: Likely related to #1 and 2 HYPERTENSION: Blood pressure stable. Will hold time antihypertensives for now. Will monitor every 4 hours x3.  BRONCHITIS, CHRONIC: See #1 and #2.  HYPERLIPIDEMIA: Will check lipid panel continue statin.  MIGRAINE HEADACHE: No recent migraine. Stable at baseline. Will hold home pain meds for now.  Code Status: Full Family Communication: none today  Disposition Plan: inpatient, home when medically ready  Consultants:  none  Procedures:  none  Antibiotics:  Anti-infectives   Start     Dose/Rate Route Frequency Ordered Stop   01/09/13 0300  azithromycin (ZITHROMAX) 500 mg in dextrose 5 % 250 mL IVPB     500 mg 250 mL/hr over 60 Minutes Intravenous Every 24 hours 01/08/13 0839     01/09/13 0200  cefTRIAXone (ROCEPHIN) 1 g in dextrose 5 % 50 mL IVPB     1 g 100 mL/hr over 30 Minutes Intravenous Every 24 hours 01/08/13 0839     01/08/13 0230  cefTRIAXone (ROCEPHIN) 1 g in dextrose 5 % 50 mL IVPB     1 g 100 mL/hr over 30 Minutes Intravenous  Once 01/08/13 0217 01/08/13 0326   01/08/13 0230  azithromycin (ZITHROMAX) tablet 500 mg     500  mg Oral  Once 01/08/13 0217 01/08/13 0304     Antibiotics Given (last 72 hours)   Date/Time Action Medication Dose Rate   01/08/13 0304 Given   azithromycin (ZITHROMAX) tablet 500 mg 500 mg    01/09/13 0300 Given   cefTRIAXone (ROCEPHIN) 1 g in dextrose 5 % 50 mL IVPB 1 g 100 mL/hr   01/09/13 0400 Given   azithromycin (ZITHROMAX) 500 mg in dextrose 5 % 250 mL IVPB 500 mg 250 mL/hr   01/10/13 0340 Given   cefTRIAXone (ROCEPHIN) 1 g in dextrose 5 % 50 mL IVPB 1 g 100 mL/hr   01/10/13 0512 Given   azithromycin (ZITHROMAX) 500 mg in dextrose 5 % 250 mL IVPB 500 mg 250 mL/hr     HPI/Subjective: - more SOB this morning, better after breathing treatment  Objective: Filed Vitals:   01/10/13 0215 01/10/13 0415 01/10/13 0652 01/10/13 0702  BP: 108/64 128/77    Pulse: 100 99    Temp: 98.3 F (36.8 C) 99.2 F (37.3 C)    TempSrc: Oral Oral    Resp: 20 20    Height:      Weight:      SpO2: 96% 93% 85% 92%    Intake/Output Summary (Last 24 hours) at 01/10/13 0829 Last data filed at 01/09/13 1930  Gross per 24 hour  Intake    840 ml  Output   1000 ml  Net   -160 ml   Filed Weights   01/08/13 0140 01/08/13 0623  Weight: 56.7 kg (  125 lb) 53.8 kg (118 lb 9.7 oz)    Exam:   General:  NAD  Cardiovascular: regular rate and rhythm, without MRG  Respiratory: coarse breath sounds with minimal wheezing.   Abdomen: soft, not tender to palpation, positive bowel sounds  MSK: no peripheral edema  Neuro: CN 2-12 grossly intact, MS 5/5 in all 4  Data Reviewed: Basic Metabolic Panel:  Recent Labs Lab 01/08/13 0217 01/09/13 0528 01/10/13 0605  NA 135 139 138  K 3.7 3.5 3.7  CL 98 106 103  CO2 25 26 27   GLUCOSE 135* 119* 155*  BUN 13 9 6   CREATININE 0.77 0.70 0.66  CALCIUM 10.0 8.3* 9.1   Liver Function Tests:  Recent Labs Lab 01/09/13 0528  AST 31  ALT 26  ALKPHOS 47  BILITOT 0.2*  PROT 6.6  ALBUMIN 3.3*   CBC:  Recent Labs Lab 01/08/13 0217  01/09/13 0528 01/10/13 0605  WBC 15.3* 17.8* 18.8*  NEUTROABS 11.5*  --   --   HGB 11.1* 9.3* 9.6*  HCT 33.3* 28.3* 29.1*  MCV 84.9 85.5 85.1  PLT 224 201 225   Cardiac Enzymes:  Recent Labs Lab 01/08/13 0217  TROPONINI <0.30   BNP (last 3 results)  Recent Labs  01/08/13 0217  PROBNP 247.0*    Recent Results (from the past 240 hour(s))  CULTURE, EXPECTORATED SPUTUM-ASSESSMENT     Status: None   Collection Time    01/08/13  6:55 PM      Result Value Range Status   Specimen Description SPUTUM   Final   Special Requests NONE   Final   Sputum evaluation     Final   Value: THIS SPECIMEN IS ACCEPTABLE. RESPIRATORY CULTURE REPORT TO FOLLOW.   Report Status 01/08/2013 FINAL   Final  CULTURE, RESPIRATORY (NON-EXPECTORATED)     Status: None   Collection Time    01/08/13  6:55 PM      Result Value Range Status   Specimen Description SPUTUM   Final   Special Requests NONE   Final   Gram Stain PENDING   Incomplete   Culture NORMAL OROPHARYNGEAL FLORA   Final   Report Status PENDING   Incomplete     Studies: No results found.  Scheduled Meds: . albuterol  2.5 mg Nebulization Q6H  . antiseptic oral rinse  15 mL Mouth Rinse BID  . azithromycin  500 mg Intravenous Q24H  . cefTRIAXone (ROCEPHIN)  IV  1 g Intravenous Q24H  . enoxaparin (LOVENOX) injection  40 mg Subcutaneous Q24H  . ipratropium  0.5 mg Nebulization Q6H  . predniSONE  40 mg Oral Q breakfast  . simvastatin  20 mg Oral QHS   Continuous Infusions: . sodium chloride 100 mL/hr at 01/10/13 0108    Principal Problem:   Acute respiratory failure with hypoxia Active Problems:   HYPERLIPIDEMIA   MIGRAINE HEADACHE   HYPERTENSION   BRONCHITIS, CHRONIC   CAP (community acquired pneumonia)   Leukocytosis, unspecified   Sinus tachycardia   Time spent: 25  Pamella Pert, MD [facility] Pager [phone]. If 7 PM - 7 AM, please contact night-coverage at [email or web address], password [facility] 01/10/2013, 8:29 AM   LOS: 2 days

## 2013-01-10 NOTE — Progress Notes (Signed)
01/10/13 1929 Notified by RT patient having low O2 sat this evening, last charted 88% on 4 lpm. Stated placed patient on 100% NRB due to low O2 sat and shortness of breath on assessment. Notified Dr. Sharl Ma this evening at 1920. Discussed patient became short of breath at times during day with activity or coughing spells but has maintained O2 sats at 95-96% on 4 lpm today. Notified she received lasix 20 mg IV this afternoon as ordered. Currently O2 sats 100% on NRB. Dr. Sharl Ma states will come to see patient and evaluate. Notified RT and night shift RN. Patient states comfortable at this time, denies shortness of breath. Family at bedside. Earnstine Regal, RN

## 2013-01-10 NOTE — Progress Notes (Addendum)
ANTIBIOTIC CONSULT NOTE - INITIAL  Pharmacy Consult for Levaquin & Cefepime Indication: pneumonia  Allergies  Allergen Reactions  . Aspirin     REACTION: Stomach upset    Patient Measurements: Height: 5\' 5"  (165.1 cm) Weight: 118 lb 9.7 oz (53.8 kg) IBW/kg (Calculated) : 57  Vital Signs: Temp: 98.5 F (36.9 C) (07/04 1417) Temp src: Oral (07/04 1417) BP: 120/73 mmHg (07/04 1417) Pulse Rate: 105 (07/04 1417) Intake/Output from previous day: 07/03 0701 - 07/04 0700 In: 960 [P.O.:960] Out: 1000 [Urine:1000] Intake/Output from this shift: Total I/O In: 111.7 [I.V.:111.7] Out: 1700 [Urine:1700]  Labs:  Recent Labs  01/08/13 0217 01/09/13 0528 01/10/13 0605  WBC 15.3* 17.8* 18.8*  HGB 11.1* 9.3* 9.6*  PLT 224 201 225  CREATININE 0.77 0.70 0.66   Estimated Creatinine Clearance: 63.5 ml/min (by C-G formula based on Cr of 0.66). No results found for this basename: VANCOTROUGH, Leodis Binet, VANCORANDOM, GENTTROUGH, GENTPEAK, GENTRANDOM, TOBRATROUGH, TOBRAPEAK, TOBRARND, AMIKACINPEAK, AMIKACINTROU, AMIKACIN,  in the last 72 hours   Microbiology: Recent Results (from the past 720 hour(s))  CULTURE, EXPECTORATED SPUTUM-ASSESSMENT     Status: None   Collection Time    01/08/13  6:55 PM      Result Value Range Status   Specimen Description SPUTUM   Final   Special Requests NONE   Final   Sputum evaluation     Final   Value: THIS SPECIMEN IS ACCEPTABLE. RESPIRATORY CULTURE REPORT TO FOLLOW.   Report Status 01/08/2013 FINAL   Final  CULTURE, RESPIRATORY (NON-EXPECTORATED)     Status: None   Collection Time    01/08/13  6:55 PM      Result Value Range Status   Specimen Description SPUTUM   Final   Special Requests NONE   Final   Gram Stain PENDING   Incomplete   Culture NORMAL OROPHARYNGEAL FLORA   Final   Report Status PENDING   Incomplete    Medical History: Past Medical History  Diagnosis Date  . Migraine   . Hypertension   . Bronchitis   . High blood  cholesterol level     Medications:  Scheduled:  . albuterol  2.5 mg Nebulization Q4H  . antiseptic oral rinse  15 mL Mouth Rinse BID  . ceFEPime (MAXIPIME) IV  1 g Intravenous Q8H  . enoxaparin (LOVENOX) injection  40 mg Subcutaneous Q24H  . ipratropium  0.5 mg Nebulization Q4H  . levofloxacin (LEVAQUIN) IV  750 mg Intravenous Q24H  . predniSONE  40 mg Oral Q breakfast  . simvastatin  20 mg Oral QHS   Assessment: 62 yo F admitted with CAP on 01/08/2013 & started on Rocephin & Zithromax.  Pt's breathing & CXR continue to worsen.  WBC is also increasing but patient on steroids.  Cx data NGTD.  Renal function is stable.   Vancomycin 7/4>> Cefepime 7/4>> Levaquin 7/4>> Rocephin 7/2>>7/4 Zithromax 7/2>>7/4  Goal of Therapy:  Eradicate infection.  Plan:  Vancomycin 750mg  IV q12h Cefepime 1gm IV q8h Levaquin 750mg  IV q24h Monitor renal function and cx data   Elson Clan 01/10/2013,2:42 PM

## 2013-01-10 NOTE — Progress Notes (Signed)
Pt appears comfortable on NRB mask , spoke with Dr. Sharl Ma earlier will titrate PT's oxygen down as tolerated. Pt not wheezing at this time but diffuse coarse crackles or rhonchi present. Will continue to monitor.

## 2013-01-10 NOTE — Progress Notes (Signed)
Patient's nebulizers changed from Q6 to Q4  (Per respiratory protocol) due to increased SOB/wheezing within the last 2 days. This morning her oxygen saturation was 85% on 3 lpm nasal cannula, increased to 4 lpm and is doing well on the that.  Yesterday she was good on 2.5 lpm nasal cannula.   Will continue to monitor.

## 2013-01-11 ENCOUNTER — Inpatient Hospital Stay (HOSPITAL_COMMUNITY): Payer: Medicaid Other

## 2013-01-11 DIAGNOSIS — J301 Allergic rhinitis due to pollen: Secondary | ICD-10-CM

## 2013-01-11 DIAGNOSIS — D649 Anemia, unspecified: Secondary | ICD-10-CM

## 2013-01-11 DIAGNOSIS — M129 Arthropathy, unspecified: Secondary | ICD-10-CM

## 2013-01-11 LAB — POCT I-STAT 3, ART BLOOD GAS (G3+)
Acid-Base Excess: 9 mmol/L — ABNORMAL HIGH (ref 0.0–2.0)
Bicarbonate: 32.6 mEq/L — ABNORMAL HIGH (ref 20.0–24.0)
Patient temperature: 101.6
TCO2: 34 mmol/L (ref 0–100)

## 2013-01-11 LAB — BLOOD GAS, ARTERIAL
Acid-Base Excess: 3.7 mmol/L — ABNORMAL HIGH (ref 0.0–2.0)
Acid-Base Excess: 4.9 mmol/L — ABNORMAL HIGH (ref 0.0–2.0)
Bicarbonate: 27.2 meq/L — ABNORMAL HIGH (ref 20.0–24.0)
Bicarbonate: 28.9 meq/L — ABNORMAL HIGH (ref 20.0–24.0)
FIO2: 100 %
FIO2: 100 %
Mode: POSITIVE
O2 Saturation: 85.8 %
O2 Saturation: 99.9 %
PEEP: 5 cmH2O
Patient temperature: 37
Patient temperature: 37
Pressure support: 15 cmH2O
TCO2: 24.9 mmol/L (ref 0–100)
TCO2: 25.7 mmol/L (ref 0–100)
pCO2 arterial: 37.4 mmHg (ref 35.0–45.0)
pCO2 arterial: 42.4 mmHg (ref 35.0–45.0)
pH, Arterial: 7.475 — ABNORMAL HIGH (ref 7.350–7.450)
pH, Arterial: 7.448 (ref 7.350–7.450)
pO2, Arterial: 47.3 mmHg — ABNORMAL LOW (ref 80.0–100.0)
pO2, Arterial: 354 mmHg — ABNORMAL HIGH (ref 80.0–100.0)

## 2013-01-11 LAB — COMPREHENSIVE METABOLIC PANEL
Alkaline Phosphatase: 60 U/L (ref 39–117)
BUN: 9 mg/dL (ref 6–23)
Calcium: 8.5 mg/dL (ref 8.4–10.5)
GFR calc Af Amer: >90 mL/min (ref >90)
GFR calc non Af Amer: >90 mL/min (ref >90)
Glucose, Bld: 161 mg/dL — ABNORMAL HIGH (ref 70–99)
Total Protein: 6.8 g/dL (ref 6.0–8.3)

## 2013-01-11 LAB — CBC WITH DIFFERENTIAL/PLATELET
Basophils Absolute: 0 10*3/uL (ref 0.0–0.1)
HCT: 26.7 % — ABNORMAL LOW (ref 36.0–46.0)
Hemoglobin: 9 g/dL — ABNORMAL LOW (ref 12.0–15.0)
Lymphocytes Relative: 6 % — ABNORMAL LOW (ref 12–46)
Monocytes Absolute: 0.5 10*3/uL (ref 0.1–1.0)
Neutro Abs: 15 10*3/uL — ABNORMAL HIGH (ref 1.7–7.7)
Neutrophils Relative %: 91 % — ABNORMAL HIGH (ref 43–77)
RDW: 13.9 % (ref 11.5–15.5)
WBC: 16.5 10*3/uL — ABNORMAL HIGH (ref 4.0–10.5)

## 2013-01-11 LAB — GLUCOSE, CAPILLARY: Glucose-Capillary: 157 mg/dL — ABNORMAL HIGH (ref 70–99)

## 2013-01-11 LAB — SEDIMENTATION RATE: Sed Rate: 77 mm/hr — ABNORMAL HIGH (ref 0–22)

## 2013-01-11 LAB — PROTIME-INR: INR: 1.27 (ref 0.00–1.49)

## 2013-01-11 MED ORDER — MORPHINE SULFATE 2 MG/ML IJ SOLN
1.0000 mg | INTRAMUSCULAR | Status: DC | PRN
  Administered 2013-01-11: 1 mg via INTRAVENOUS

## 2013-01-11 MED ORDER — MIDAZOLAM HCL 5 MG/ML IJ SOLN
6.0000 mg | Freq: Once | INTRAMUSCULAR | Status: AC
  Administered 2013-01-11: 6 mg via INTRAVENOUS

## 2013-01-11 MED ORDER — PROPOFOL 10 MG/ML IV EMUL
INTRAVENOUS | Status: AC
  Filled 2013-01-11: qty 100

## 2013-01-11 MED ORDER — POTASSIUM CHLORIDE 10 MEQ/50ML IV SOLN: 10.0000 meq | INTRAVENOUS | Status: DC

## 2013-01-11 MED ORDER — IPRATROPIUM BROMIDE 0.02 % IN SOLN
0.5000 mg | Freq: Four times a day (QID) | RESPIRATORY_TRACT | Status: DC
  Administered 2013-01-11 – 2013-01-12 (×3): 0.5 mg via RESPIRATORY_TRACT
  Filled 2013-01-11 (×4): qty 2.5

## 2013-01-11 MED ORDER — FENTANYL CITRATE 0.05 MG/ML IJ SOLN
INTRAMUSCULAR | Status: AC
  Filled 2013-01-11: qty 2

## 2013-01-11 MED ORDER — PANTOPRAZOLE SODIUM 40 MG IV SOLR
40.0000 mg | INTRAVENOUS | Status: DC
  Administered 2013-01-11: 40 mg via INTRAVENOUS
  Filled 2013-01-11 (×3): qty 40

## 2013-01-11 MED ORDER — FUROSEMIDE 10 MG/ML IJ SOLN
20.0000 mg | Freq: Once | INTRAMUSCULAR | Status: AC
  Administered 2013-01-11: 20 mg via INTRAVENOUS
  Filled 2013-01-11: qty 2

## 2013-01-11 MED ORDER — LORAZEPAM 2 MG/ML IJ SOLN
1.0000 mg | Freq: Once | INTRAMUSCULAR | Status: AC
  Administered 2013-01-11: 1 mg via INTRAVENOUS
  Filled 2013-01-11: qty 1

## 2013-01-11 MED ORDER — VITAL AF 1.2 CAL PO LIQD
1000.0000 mL | ORAL | Status: DC
  Administered 2013-01-11 – 2013-01-12 (×2): 1000 mL
  Filled 2013-01-11 (×4): qty 1000

## 2013-01-11 MED ORDER — LORAZEPAM 2 MG/ML IJ SOLN: 1.0000 mg | Freq: Four times a day (QID) | INTRAMUSCULAR | Status: DC | PRN

## 2013-01-11 MED ORDER — MORPHINE SULFATE 2 MG/ML IJ SOLN
2.0000 mg | INTRAMUSCULAR | Status: DC | PRN
  Administered 2013-01-11: 2 mg via INTRAVENOUS
  Filled 2013-01-11: qty 1

## 2013-01-11 MED ORDER — CHLORHEXIDINE GLUCONATE 0.12 % MT SOLN
OROMUCOSAL | Status: AC
  Filled 2013-01-11: qty 15

## 2013-01-11 MED ORDER — PROPOFOL 10 MG/ML IV EMUL
5.0000 ug/kg/min | INTRAVENOUS | Status: DC
  Administered 2013-01-11: 48.08 ug/kg/min via INTRAVENOUS
  Administered 2013-01-11: 43.783 ug/kg/min via INTRAVENOUS
  Administered 2013-01-11: 40.864 ug/kg/min via INTRAVENOUS
  Administered 2013-01-12 – 2013-01-13 (×2): 25 ug/kg/min via INTRAVENOUS
  Filled 2013-01-11 (×6): qty 100

## 2013-01-11 MED ORDER — FUROSEMIDE 10 MG/ML IJ SOLN
10.0000 mg | Freq: Two times a day (BID) | INTRAMUSCULAR | Status: DC
  Administered 2013-01-11 – 2013-01-12 (×2): 10 mg via INTRAVENOUS
  Filled 2013-01-11: qty 1
  Filled 2013-01-11: qty 2
  Filled 2013-01-11: qty 1
  Filled 2013-01-11: qty 2

## 2013-01-11 MED ORDER — CHLORHEXIDINE GLUCONATE 0.12 % MT SOLN
OROMUCOSAL | Status: AC
  Administered 2013-01-11: 15 mL
  Filled 2013-01-11: qty 15

## 2013-01-11 MED ORDER — LEVALBUTEROL HCL 0.63 MG/3ML IN NEBU
0.6300 mg | INHALATION_SOLUTION | Freq: Four times a day (QID) | RESPIRATORY_TRACT | Status: DC
  Administered 2013-01-11 – 2013-01-12 (×4): 0.63 mg via RESPIRATORY_TRACT
  Filled 2013-01-11 (×8): qty 3

## 2013-01-11 MED ORDER — FENTANYL CITRATE 0.05 MG/ML IJ SOLN
300.0000 ug | Freq: Once | INTRAMUSCULAR | Status: AC
  Administered 2013-01-11: 300 ug via INTRAVENOUS

## 2013-01-11 MED ORDER — DM-GUAIFENESIN ER 30-600 MG PO TB12
1.0000 | ORAL_TABLET | Freq: Two times a day (BID) | ORAL | Status: DC
  Administered 2013-01-11: 1 via ORAL
  Filled 2013-01-11 (×2): qty 1

## 2013-01-11 MED ORDER — SODIUM CHLORIDE 0.9 % IV SOLN
25.0000 ug/h | INTRAVENOUS | Status: DC
  Administered 2013-01-11: 50 ug/h via INTRAVENOUS
  Administered 2013-01-12: 100 ug/h via INTRAVENOUS
  Administered 2013-01-13 – 2013-01-14 (×3): 150 ug/h via INTRAVENOUS
  Administered 2013-01-15: 100 ug/h via INTRAVENOUS
  Filled 2013-01-11 (×5): qty 50

## 2013-01-11 MED ORDER — POTASSIUM CHLORIDE 20 MEQ/15ML (10%) PO LIQD
40.0000 meq | Freq: Once | ORAL | Status: AC
  Administered 2013-01-11: 40 meq via ORAL
  Filled 2013-01-11: qty 30

## 2013-01-11 MED ORDER — DEXTROSE 5 % IV SOLN
2.0000 g | Freq: Three times a day (TID) | INTRAVENOUS | Status: AC
  Administered 2013-01-11 – 2013-01-19 (×24): 2 g via INTRAVENOUS
  Filled 2013-01-11 (×24): qty 2

## 2013-01-11 MED ORDER — ACETAMINOPHEN 325 MG PO TABS
650.0000 mg | ORAL_TABLET | Freq: Four times a day (QID) | ORAL | Status: DC | PRN
  Administered 2013-01-11 – 2013-01-12 (×2): 650 mg via ORAL
  Filled 2013-01-11 (×2): qty 2

## 2013-01-11 MED ORDER — MIDAZOLAM HCL 2 MG/2ML IJ SOLN
INTRAMUSCULAR | Status: AC
  Administered 2013-01-11: 2 mg
  Filled 2013-01-11: qty 6

## 2013-01-11 MED ORDER — MORPHINE SULFATE 4 MG/ML IJ SOLN
INTRAMUSCULAR | Status: AC
  Filled 2013-01-11: qty 1

## 2013-01-11 MED ORDER — FENTANYL CITRATE 0.05 MG/ML IJ SOLN
INTRAMUSCULAR | Status: AC
  Filled 2013-01-11: qty 4

## 2013-01-11 MED ORDER — LEVALBUTEROL HCL 0.63 MG/3ML IN NEBU
0.6300 mg | INHALATION_SOLUTION | RESPIRATORY_TRACT | Status: DC | PRN
  Administered 2013-01-11 (×2): 0.63 mg via RESPIRATORY_TRACT
  Filled 2013-01-11 (×2): qty 3

## 2013-01-11 MED ORDER — FENTANYL BOLUS VIA INFUSION
25.0000 ug | Freq: Four times a day (QID) | INTRAVENOUS | Status: DC | PRN
  Administered 2013-01-15: 100 ug via INTRAVENOUS
  Filled 2013-01-11: qty 100

## 2013-01-11 NOTE — H&P (Signed)
PULMONARY  / CRITICAL CARE MEDICINE  Name: Cynthia [name] MRN: [id number] DOB: 1951/01/29    ADMISSION DATE:  01/08/2013 CONSULTATION DATE:  01/11/13  REFERRING MD:  Dr. Elvera Lennox, Internal medicine [facility]) PRIMARY SERVICE: CCM  CHIEF COMPLAINT:  Cough, SOB  BRIEF PATIENT DESCRIPTION:  62 year old female with chronic bronchitis, HTN, HLD who presented from [facility] with worsening PNA and acute respiratory failure.  SIGNIFICANT EVENTS / STUDIES:  7/2 - S. Pneumo and Legionella Urine Ag - Neg 7/4 Chest Xray - bilateral PNA 7/5 - Acute respiratory failure, Intubated and central line placed 7/5 Bronch - Sputum for culture; no pus noted; secretions minimal  LINES / TUBES: ETT 7/5 >>>  R IJ 7/5>>  CULTURES: Sputum 7/5>> BCx 7/5 >> No growth  ANTIBIOTICS: Vanc 7/4>> Ceftriaxone 7/1 >> 7/4 Cefepime 7/4 >>7/5 Levaquin 7/4 >> Ceftaz 7/5 >>  HISTORY OF PRESENT ILLNESS:   62 year old AA female with PMH of chronic bronchitis, HTN, HLD who presents from [facility] with HCAP and acute respiratory failure.  Patient initially presented on 7/2 with 2 day history of cough and SOB.  Patient was initially treated with CTX/Azithromycin, but was transitioned to Vanc/Levaquin/Cefepime after failing to improve.  With worsening saturation and pulmonary status, patient was transferred to [facility] ICU.   PAST MEDICAL HISTORY :  Past Medical History  Diagnosis Date  . Migraine   . Hypertension   . Bronchitis   . High blood cholesterol level    Past Surgical History  Procedure Laterality Date  . Left arm     Prior to Admission medications   Medication Sig Start Date End Date Taking? Authorizing Provider  albuterol (PROVENTIL HFA;VENTOLIN HFA) 108 (90 BASE) MCG/ACT inhaler Inhale 2 puffs into the lungs every 6 (six) hours as needed for wheezing.   Yes Historical Provider, MD  butalbital-acetaminophen-caffeine (FIORICET WITH CODEINE) 50-325-40-30 MG per capsule Take 1 capsule by mouth  every 6 (six) hours as needed for migraine.   Yes Historical Provider, MD  FERROUS SULFATE PO Take 1 tablet by mouth daily.   Yes Historical Provider, MD  lisinopril-hydrochlorothiazide (PRINZIDE,ZESTORETIC) 10-12.5 MG per tablet Take 1 tablet by mouth daily.    Yes Historical Provider, MD  simvastatin (ZOCOR) 20 MG tablet Take 20 mg by mouth at bedtime.     Yes Historical Provider, MD   Allergies  Allergen Reactions  . Aspirin     REACTION: Stomach upset    FAMILY HISTORY:  Family History  Problem Relation Age of Onset  . Heart failure Father   . Hypertension Father   . Stroke Father    SOCIAL HISTORY:  reports that she has been smoking Cigarettes.  She has a 14 pack-year smoking history. She has never used smokeless tobacco. She reports that she does not drink alcohol or use illicit drugs.  REVIEW OF SYSTEMS:  Unable to obtain given intubation  SUBJECTIVE:  Patient in respiratory distress on arrival.  VITAL SIGNS: Temp:  [97.6 F (36.4 C)-103.1 F (39.5 C)] 101.6 F (38.7 C) (07/05 1200) Pulse Rate:  [82-134] 103 (07/05 1245) Resp:  [17-37] 20 (07/05 1245) BP: (91-137)/(56-83) 100/56 mmHg (07/05 1245) SpO2:  [84 %-100 %] 100 % (07/05 1245) FiO2 (%):  [60 %-100 %] 100 % (07/05 1200) Weight:  [125 lb 14.1 oz (57.1 kg)] 125 lb 14.1 oz (57.1 kg) (07/05 0331) HEMODYNAMICS:   VENTILATOR SETTINGS: Vent Mode:  [-] PRVC FiO2 (%):  [60 %-100 %] 100 % Set Rate:  [  16 bmp] 16 bmp Vt Set:  [500 mL] 500 mL PEEP:  [10 cmH20] 10 cmH20 Plateau Pressure:  [31 cmH20] 31 cmH20 INTAKE / OUTPUT: Intake/Output     07/04 0701 - 07/05 0700 07/05 0701 - 07/06 0700   P.O.     I.V. (mL/kg) 131.7 (2.3) 100 (1.8)   IV Piggyback 300 150   Total Intake(mL/kg) 431.7 (7.6) 250 (4.4)   Urine (mL/kg/hr) 3251 (2.4) 400 (1.1)   Total Output 3251 400   Net -2819.3 -150        Stool Occurrence 1 x      PHYSICAL EXAMINATION: General:  Ill appearing and in acute distress.  Patient was immediately  intubated. Neuro: Now sedated on vent HEENT: ETT tube in place, trachea midline Cardiovascular:  RRR. No m/r/g noted Lungs: Good air movement.  Coarse breath sounds on inspiration. Abdomen: soft, nondistended.  No organomegaly noted Extremities: No edema appreciated. Skin:  Intact.  LABS:  Recent Labs Lab 01/08/13 0217 01/09/13 0528 01/10/13 0605 01/11/13 0338 01/11/13 0740  HGB 11.1* 9.3* 9.6*  --   --   WBC 15.3* 17.8* 18.8*  --   --   PLT 224 201 225  --   --   NA 135 139 138  --   --   K 3.7 3.5 3.7  --   --   CL 98 106 103  --   --   CO2 25 26 27   --   --   GLUCOSE 135* 119* 155*  --   --   BUN 13 9 6   --   --   CREATININE 0.77 0.70 0.66  --   --   CALCIUM 10.0 8.3* 9.1  --   --   AST  --  31  --   --   --   ALT  --  26  --   --   --   ALKPHOS  --  47  --   --   --   BILITOT  --  0.2*  --   --   --   PROT  --  6.6  --   --   --   ALBUMIN  --  3.3*  --   --   --   TROPONINI <0.30  --   --   --   --   PROBNP 247.0*  --   --   --   --   PHART  --   --   --  7.475* 7.448  PCO2ART  --   --   --  37.4 42.4  PO2ART  --   --   --  47.3* 354.0*   No results found for this basename: GLUCAP,  in the last 168 hours  CXR: ETT and R IJ in good position, diffuse bilateral infiltrates  ASSESSMENT / PLAN:  PULMONARY A: Acute respiratory failure, r/o HCAP, Chronic Bronchitis, r/o pulm edema component, r/o non infectious pneumonitis P:   Obtaining CT chest to eval infiltrates Abx - Ceftaz, Vanc, Levaquin for HCAP Albuterol/Atrovent Q6 HIV Antibody Sputum from Bronch for Culture, Cell count - done PCP DFA Holding Steroids, empiric for now C3, C4, ANCA, ANA, DS DNA, ESR, CRP cvp assessment Pao2/fio2 not meet ards at this stage Start 8 cc/kg, avoid plat greater 25/30 Peep 8-10 requried for now Send anca, ana, dsdna, ua, rf, rf, compliment, ana, esr  CARDIOVASCULAR A: Hemodynamically stable, No cardiac history P:  Tele DVT PPx - Lovenox Echo assessment ecg  reviewed, unimpressive for cardiomyopathy etc  RENAL A:  No Hx of renal disease, Creatinine=normal P:   Monitor closely via BMP Monitor volume status cvp 14, lasix bmet now  GASTROINTESTINAL A:  SUP PPx P:   Protonix for SUP Start TF today lft in am   HEMATOLOGIC A:  DVT PPx, Leukocytosis secondary to HCAP P:  Trend CBC Lovenox for DVT PPx- follow crt closely coags now  INFECTIOUS A:  Bilateral infiltrates >>HCAP (given hospitalization since end june), pcp? P:   Vanc/Levaquin/Ceftaz Trend CBC  Daily Chest xray Follow bronch results Bronch pcp, leg dfa, cell count diff  ENDOCRINE A: No issues currently P:  cbg Obtaining Immunological work up given severity of illness unclear nature of infiltrates (see above)  NEUROLOGIC A:  Acute encephalopathy secondary to Acute respiratory failure P:   Sedated Vent, propofol,, fent WUA daily Goal Rass score 0 to -1  TODAY'S SUMMARY: Intubated, Central line placed, Cefepime changed to Ceftaz, Immunological work up and cultures sent, ett placed, bronch done, for CT chest  I have personally obtained a history, examined the patient, evaluated laboratory and imaging results, formulated the assessment and plan and placed orders. CRITICAL CARE: The patient is critically ill with multiple organ systems failure and requires high complexity decision making for assessment and support, frequent evaluation and titration of therapies, application of advanced monitoring technologies and extensive interpretation of multiple databases. Critical Care Time devoted to patient care services described in this note is 85 minutes.   Everlene Other DO Family Medicine PGY-2  01/11/2013, 1:22 PM  I have fully examined this patient and agree with above findings.    And edited in full  Mcarthur Rossetti. Tyson Alias, MD, FACP Pgr: [phone] [facility]

## 2013-01-11 NOTE — Progress Notes (Signed)
At approximately 0100 patient called me and informed me her breathing was getting worse. On-call MD made aware, verbal orders followed, continued to monitor patient. Patient status did not improve (sating in 70's and 80's, ST heart rate 120-140 bpm). On-call doctor made aware that patient status was not improving. Called RRT up to look at patient. Patient was finally moved down to step down ICU, husband made aware.

## 2013-01-11 NOTE — Progress Notes (Addendum)
Patient moved to the ICU. Patient's oxygen saturation continues to drop to 80s on coughing. She still has tachycardia with heart rate in 130s. Will put the patient on BiPAP, give another dose of Lasix, insert Foley catheter, will give dextromethorphan for cough. Morphine 2 mg IV every 4 hours when necessary for pain and dyspnea. We'll continue to monitor

## 2013-01-11 NOTE — Progress Notes (Signed)
Placed PT on Venti Mask at 50% her Sat decreased to 76 when she got up to use the bedside toilet, placed back on NRB for now will titrate again later.

## 2013-01-11 NOTE — Procedures (Signed)
Intubation Procedure Note Cynthia [name] [id number] 03/09/1951  Procedure: Intubation Indications: Respiratory insufficiency  Procedure Details Consent: Risks of procedure as well as the alternatives and risks of each were explained to the (patient/caregiver).  Consent for procedure obtained. Time Out: Verified patient identification, verified procedure, site/side was marked, verified correct patient position, special equipment/implants available, medications/allergies/relevent history reviewed, required imaging and test results available.  Performed  Maximum sterile technique was used including gown, hand hygiene and mask.  Miller and 3    Evaluation Hemodynamic Status: BP stable throughout; O2 sats: stable throughout Patient's Current Condition: stable Complications: No apparent complications Patient did tolerate procedure well. Chest X-ray ordered to verify placement.  CXR: pending.   Cynthia [name] 01/11/2013

## 2013-01-11 NOTE — Procedures (Signed)
Central Venous Catheter Insertion Procedure Note Cynthia [name] [id number] [birth year]  Procedure: Insertion of Central Venous Catheter Indications: Assessment of intravascular volume, Drug and/or fluid administration and Frequent blood sampling  Procedure Details Consent: Risks of procedure as well as the alternatives and risks of each were explained to the (patient/caregiver).  Consent for procedure obtained. Time Out: Verified patient identification, verified procedure, site/side was marked, verified correct patient position, special equipment/implants available, medications/allergies/relevent history reviewed, required imaging and test results available.  Performed  Maximum sterile technique was used including antiseptics, cap, gloves, gown, hand hygiene, mask and sheet. Skin prep: Chlorhexidine; local anesthetic administered A antimicrobial bonded/coated triple lumen catheter was placed in the right internal jugular vein using the Seldinger technique.  Evaluation Blood flow good Complications: No apparent complications Patient did tolerate procedure well. Chest X-ray ordered to verify placement.  CXR: pending.  [name] 01/11/2013, 1:08 PM  She consented pre intubation cvp needed Access [place] gudiance  Mcarthur Rossetti. Tyson Alias, MD, FACP Pgr: [phone] [facility]

## 2013-01-11 NOTE — Progress Notes (Signed)
PT HAS BEEN TRANSFERRED TO [facility] ROOM 2112 VIA CARE LINK. REMAINS ON BIPAP. CONTINUES TO HAVE RHONCHI AND WHEEZING,AND NON-PRODUCTIVE COUGH. PT IS ALERT AND ORIENTED. LT AC IV PATENT. FOLEY CATH PATENT. FAMILY HAS LEFT TO MEET PT AT [place]. TRANSFER REPORT CALLED TO SABA RN ON 2100.

## 2013-01-11 NOTE — Progress Notes (Signed)
Called by nurse to evaluate patient for hypoxia, the O2 sats on cardiac monitor were in 80's whereas O2 sats on [place] in 99-100%. Patient does not appear in respiratory distress , unless she coughs. She also has her HR in 120-130, earlier she got ativan 1 mg x 1 for anxiety. She has also spiked temp of 101. On exam: Lung auscultation : Bilateral rhonchi Plan: Change Albuterol to xopenex           Transfer to step down for better monitoring of her oxygen saturation           Blood cultures x2           Already on Broad spectrum antibiotics            Ativan 1 mg iv q 6 hr prn for anxiety           Continue to monitor

## 2013-01-11 NOTE — Accreditation Note (Signed)
Patient made aware of the transfer to ICU and husband called as well to bed ICU5

## 2013-01-11 NOTE — Procedures (Signed)
Bronchoscopy Procedure Note Cynthia [name] [id number] 1950-12-11  Procedure: Bronchoscopy Indications: Diagnostic evaluation of the airways and Obtain specimens for culture and/or other diagnostic studies  Procedure Details Consent: Risks of procedure as well as the alternatives and risks of each were explained to the (patient/caregiver).  Consent for procedure obtained. Time Out: Verified patient identification, verified procedure, site/side was marked, verified correct patient position, special equipment/implants available, medications/allergies/relevent history reviewed, required imaging and test results available.  Performed  In preparation for procedure, patient was given 100% FiO2 and bronchoscope lubricated. Sedation: Benzodiazepines  Airway entered and the following bronchi were examined: RUL, RML, RLL, LUL, LLL and Bronchi.   Procedures performed: Brushings performed Bronchoscope removed.    Evaluation Hemodynamic Status: BP stable throughout; O2 sats: stable throughout Patient's Current Condition: stable Specimens:  Sent serosanguinous fluid Complications: No apparent complications Patient did tolerate procedure well.   Cynthia Bucks. 01/11/2013   1. No significant secretions al all 2. BAL RML clear 3. Rt main intubation, corrected to 2 cm above carina  Cynthia [name]. Tyson Alias, MD, FACP Pgr: [phone] [facility]

## 2013-01-11 NOTE — Progress Notes (Signed)
[facility] PROGRESS NOTE  Cynthia [name] ZOX:[id number] DOB: 07-06-1951 DOA: 01/08/2013 PCP: Evlyn Courier, MD  HPI: Cynthia [name] is a 62 y.o. female with past medical history that includes hypertension, bronchitis, migraine presents to the emergency room with a chief complaint of cough and shortness of breath. Chest x-ray yields diffuse interstitial pattern suggesting atypical infections/mycoplasma.   Assessment/Plan: Acute respiratory failure with hypoxia: Due to community-acquired pneumonia; patient with worsening respiratory status yesterday and throughout the night. Antibiotics broadened yesterday to Vanc/Cefepime/Levo and patient needed ICU transfer overnight and placed on BiPAP.  - alert this morning and oriented, tolerating BiPAP well; ABG on 100% FIO2 showed good oxygenation levels - I am somewhat concerned about her CXR evolution within the past 48 hours, and that she might head towards intubation soon; talked to Dr. Tyson Alias and will transfer patient to [facility] ICU given these concerns. She is normotensive and oxygenating well on BiPAP, alert, and will defer intubation to [facility] ICU team at this point.    Code Status: Full Family Communication: husband Disposition Plan: [facility] ICU Consultants:  none  Procedures:  none  Antibiotics:  Anti-infectives   Start     Dose/Rate Route Frequency Ordered Stop   01/10/13 1700  vancomycin (VANCOCIN) IVPB 750 mg/150 ml premix     750 mg 150 mL/hr over 60 Minutes Intravenous Every 12 hours 01/10/13 1453     01/10/13 1600  levofloxacin (LEVAQUIN) IVPB 750 mg     750 mg 100 mL/hr over 90 Minutes Intravenous Every 24 hours 01/10/13 1441     01/10/13 1500  ceFEPIme (MAXIPIME) 1 g in dextrose 5 % 50 mL IVPB     1 g 100 mL/hr over 30 Minutes Intravenous 3 times per day 01/10/13 1441     01/09/13 0300  azithromycin (ZITHROMAX) 500 mg in dextrose 5 % 250 mL IVPB  Status:  Discontinued     500 mg 250 mL/hr over 60 Minutes Intravenous  Every 24 hours 01/08/13 0839 01/10/13 1400   01/09/13 0200  cefTRIAXone (ROCEPHIN) 1 g in dextrose 5 % 50 mL IVPB  Status:  Discontinued     1 g 100 mL/hr over 30 Minutes Intravenous Every 24 hours 01/08/13 0839 01/10/13 1400   01/08/13 0230  cefTRIAXone (ROCEPHIN) 1 g in dextrose 5 % 50 mL IVPB     1 g 100 mL/hr over 30 Minutes Intravenous  Once 01/08/13 0217 01/08/13 0326   01/08/13 0230  azithromycin (ZITHROMAX) tablet 500 mg     500 mg Oral  Once 01/08/13 0217 01/08/13 0304     Antibiotics Given (last 72 hours)   Date/Time Action Medication Dose Rate   01/09/13 0300 Given   cefTRIAXone (ROCEPHIN) 1 g in dextrose 5 % 50 mL IVPB 1 g 100 mL/hr   01/09/13 0400 Given   azithromycin (ZITHROMAX) 500 mg in dextrose 5 % 250 mL IVPB 500 mg 250 mL/hr   01/10/13 0340 Given   cefTRIAXone (ROCEPHIN) 1 g in dextrose 5 % 50 mL IVPB 1 g 100 mL/hr   01/10/13 0512 Given   azithromycin (ZITHROMAX) 500 mg in dextrose 5 % 250 mL IVPB 500 mg 250 mL/hr   01/10/13 1606 Given  [med not available at scheduled time]   ceFEPIme (MAXIPIME) 1 g in dextrose 5 % 50 mL IVPB 1 g 100 mL/hr   01/10/13 1750 Given   levofloxacin (LEVAQUIN) IVPB 750 mg 750 mg 100 mL/hr   01/10/13 2041 Given   vancomycin (VANCOCIN) IVPB 750 mg/150  ml premix 750 mg 150 mL/hr   01/10/13 2311 Given   ceFEPIme (MAXIPIME) 1 g in dextrose 5 % 50 mL IVPB 1 g 100 mL/hr   01/11/13 0550 Given   ceFEPIme (MAXIPIME) 1 g in dextrose 5 % 50 mL IVPB 1 g 100 mL/hr   01/11/13 0751 Given   vancomycin (VANCOCIN) IVPB 750 mg/150 ml premix 750 mg 150 mL/hr     HPI/Subjective: - sleepy but alert and interactive.   Objective: Filed Vitals:   01/11/13 0700 01/11/13 0731 01/11/13 0747 01/11/13 0800  BP: 101/65   104/70  Pulse: 103   124  Temp:  100.4 F (38 C)    TempSrc:  Axillary    Resp: 19   21  Height:    5\' 5"  (1.651 m)  Weight:      SpO2: 100%  100% 84%    Intake/Output Summary (Last 24 hours) at 01/11/13 0858 Last data filed at  01/11/13 0751  Gross per 24 hour  Intake 558.67 ml  Output   3251 ml  Net -2692.33 ml   Filed Weights   01/08/13 0140 01/08/13 0623 01/11/13 0331  Weight: 56.7 kg (125 lb) 53.8 kg (118 lb 9.7 oz) 57.1 kg (125 lb 14.1 oz)    Exam:   General:  Bipap on  Cardiovascular: regular rate and rhythm, without MRG  Respiratory: very coarse breath sounds throughout  Abdomen: soft, not tender to palpation, positive bowel sounds  MSK: no peripheral edema  Neuro: CN 2-12 grossly intact, MS 5/5 in all 4  Data Reviewed: Basic Metabolic Panel:  Recent Labs Lab 01/08/13 0217 01/09/13 0528 01/10/13 0605  NA 135 139 138  K 3.7 3.5 3.7  CL 98 106 103  CO2 25 26 27   GLUCOSE 135* 119* 155*  BUN 13 9 6   CREATININE 0.77 0.70 0.66  CALCIUM 10.0 8.3* 9.1   Liver Function Tests:  Recent Labs Lab 01/09/13 0528  AST 31  ALT 26  ALKPHOS 47  BILITOT 0.2*  PROT 6.6  ALBUMIN 3.3*   CBC:  Recent Labs Lab 01/08/13 0217 01/09/13 0528 01/10/13 0605  WBC 15.3* 17.8* 18.8*  NEUTROABS 11.5*  --   --   HGB 11.1* 9.3* 9.6*  HCT 33.3* 28.3* 29.1*  MCV 84.9 85.5 85.1  PLT 224 201 225   Cardiac Enzymes:  Recent Labs Lab 01/08/13 0217  TROPONINI <0.30   BNP (last 3 results)  Recent Labs  01/08/13 0217  PROBNP 247.0*    Recent Results (from the past 240 hour(s))  CULTURE, EXPECTORATED SPUTUM-ASSESSMENT     Status: None   Collection Time    01/08/13  6:55 PM      Result Value Range Status   Specimen Description SPUTUM   Final   Special Requests NONE   Final   Sputum evaluation     Final   Value: THIS SPECIMEN IS ACCEPTABLE. RESPIRATORY CULTURE REPORT TO FOLLOW.   Report Status 01/08/2013 FINAL   Final  CULTURE, RESPIRATORY (NON-EXPECTORATED)     Status: None   Collection Time    01/08/13  6:55 PM      Result Value Range Status   Specimen Description SPUTUM   Final   Special Requests NONE   Final   Gram Stain PENDING   Incomplete   Culture NORMAL OROPHARYNGEAL FLORA    Final   Report Status PENDING   Incomplete  CULTURE, BLOOD (ROUTINE X 2)     Status: None   Collection Time  01/11/13  3:27 AM      Result Value Range Status   Specimen Description Blood RIGHT ANTECUBITAL   Final   Special Requests BOTTLES DRAWN AEROBIC AND ANAEROBIC 6CC   Final   Culture NO GROWTH <24 HRS   Final   Report Status PENDING   Incomplete  CULTURE, BLOOD (ROUTINE X 2)     Status: None   Collection Time    01/11/13  3:52 AM      Result Value Range Status   Specimen Description Blood BLOOD RIGHT HAND   Final   Special Requests BOTTLES DRAWN AEROBIC AND ANAEROBIC 6CC   Final   Culture NO GROWTH <24 HRS   Final   Report Status PENDING   Incomplete     Studies: Dg Chest 2 View  01/10/2013   *RADIOLOGY REPORT*  Clinical Data: Shortness of breath.  Weakness.  Pneumonia.  CHEST - 2 VIEW  Comparison: 01/08/2013  Findings: There has been marked progression of the bilateral pneumonia.  This is particularly severe in the upper lung zones. There are new tiny bilateral pleural effusions.  Heart size and vascularity are normal considering the shallow inspiration.  No acute osseous abnormality.  Slight thoracic scoliosis.  IMPRESSION: Marked progression of bilateral pneumonia.  New tiny effusions.   Original Report Authenticated By: Francene Boyers, M.D.   Dg Chest Port 1v Same Day  01/11/2013   *RADIOLOGY REPORT*  Clinical Data: Dyspnea  PORTABLE CHEST - 1 VIEW SAME DAY  Comparison: 01/10/2013  Findings: Normal heart size.  There is no pleural effusion identified.  Bilateral upper lobe airspace opacities are not changed from previous exam.  IMPRESSION:  1.  No change in bilateral upper lobe airspace opacities.   Original Report Authenticated By: Signa Kell, M.D.    Scheduled Meds: . antiseptic oral rinse  15 mL Mouth Rinse BID  . ceFEPime (MAXIPIME) IV  1 g Intravenous Q8H  . dextromethorphan-guaiFENesin  1 tablet Oral BID  . enoxaparin (LOVENOX) injection  40 mg Subcutaneous Q24H  .  ipratropium  0.5 mg Nebulization Q4H  . levofloxacin (LEVAQUIN) IV  750 mg Intravenous Q24H  . predniSONE  40 mg Oral Q breakfast  . simvastatin  20 mg Oral QHS  . vancomycin  750 mg Intravenous Q12H   Continuous Infusions: . sodium chloride 20 mL/hr at 01/10/13 1610    Principal Problem:   Acute respiratory failure with hypoxia Active Problems:   HYPERLIPIDEMIA   MIGRAINE HEADACHE   HYPERTENSION   BRONCHITIS, CHRONIC   CAP (community acquired pneumonia)   Leukocytosis, unspecified   Sinus tachycardia  Time spent: 35  Pamella Pert, MD [facility] Pager [phone]. If 7 PM - 7 AM, please contact night-coverage at [email or web address], password [facility] 01/11/2013, 8:58 AM  LOS: 3 days

## 2013-01-11 NOTE — Progress Notes (Signed)
ANTIBIOTIC CONSULT NOTE   Pharmacy Consult for ceftazidime, vancomycin and levofloxacin Indication: pneumonia  Allergies  Allergen Reactions  . Aspirin     REACTION: Stomach upset    Patient Measurements: Height: 5\' 5"  (165.1 cm) Weight: 125 lb 14.1 oz (57.1 kg) IBW/kg (Calculated) : 57  Vital Signs: Temp: 101.6 F (38.7 C) (07/05 1200) Temp src: Rectal (07/05 1200) BP: 100/56 mmHg (07/05 1245) Pulse Rate: 103 (07/05 1245) Intake/Output from previous day: 07/04 0701 - 07/05 0700 In: 431.7 [I.V.:131.7; IV Piggyback:300] Out: 3251 [Urine:3251] Intake/Output from this shift: Total I/O In: 250 [I.V.:100; IV Piggyback:150] Out: 400 [Urine:400]  Labs:  Recent Labs  01/09/13 0528 01/10/13 0605  WBC 17.8* 18.8*  HGB 9.3* 9.6*  PLT 201 225  CREATININE 0.70 0.66   Estimated Creatinine Clearance: 67.3 ml/min (by C-G formula based on Cr of 0.66). No results found for this basename: VANCOTROUGH, Leodis Binet, VANCORANDOM, GENTTROUGH, GENTPEAK, GENTRANDOM, TOBRATROUGH, TOBRAPEAK, TOBRARND, AMIKACINPEAK, AMIKACINTROU, AMIKACIN,  in the last 72 hours   Microbiology: Recent Results (from the past 720 hour(s))  CULTURE, EXPECTORATED SPUTUM-ASSESSMENT     Status: None   Collection Time    01/08/13  6:55 PM      Result Value Range Status   Specimen Description SPUTUM   Final   Special Requests NONE   Final   Sputum evaluation     Final   Value: THIS SPECIMEN IS ACCEPTABLE. RESPIRATORY CULTURE REPORT TO FOLLOW.   Report Status 01/08/2013 FINAL   Final  CULTURE, RESPIRATORY (NON-EXPECTORATED)     Status: None   Collection Time    01/08/13  6:55 PM      Result Value Range Status   Specimen Description SPUTUM   Final   Special Requests NONE   Final   Gram Stain PENDING   Incomplete   Culture NORMAL OROPHARYNGEAL FLORA   Final   Report Status PENDING   Incomplete  CULTURE, BLOOD (ROUTINE X 2)     Status: None   Collection Time    01/11/13  3:27 AM      Result Value Range  Status   Specimen Description Blood RIGHT ANTECUBITAL   Final   Special Requests BOTTLES DRAWN AEROBIC AND ANAEROBIC 6CC   Final   Culture NO GROWTH <24 HRS   Final   Report Status PENDING   Incomplete  CULTURE, BLOOD (ROUTINE X 2)     Status: None   Collection Time    01/11/13  3:52 AM      Result Value Range Status   Specimen Description Blood BLOOD RIGHT HAND   Final   Special Requests BOTTLES DRAWN AEROBIC AND ANAEROBIC 6CC   Final   Culture NO GROWTH <24 HRS   Final   Report Status PENDING   Incomplete  MRSA PCR SCREENING     Status: None   Collection Time    01/11/13 11:00 AM      Result Value Range Status   MRSA by PCR NEGATIVE  NEGATIVE Final   Comment:            The GeneXpert MRSA Assay (FDA     approved for NASAL specimens     only), is one component of a     comprehensive MRSA colonization     surveillance program. It is not     intended to diagnose MRSA     infection nor to guide or     monitor treatment for     MRSA infections.  Medical History: Past Medical History  Diagnosis Date  . Migraine   . Hypertension   . Bronchitis   . High blood cholesterol level     Medications:  Scheduled:  . cefTAZidime (FORTAZ)  IV  2 g Intravenous Q8H  . enoxaparin (LOVENOX) injection  40 mg Subcutaneous Q24H  . fentaNYL      . fentaNYL      . ipratropium  0.5 mg Nebulization Q6H  . levalbuterol  0.63 mg Nebulization Q6H  . levofloxacin (LEVAQUIN) IV  750 mg Intravenous Q24H  . predniSONE  40 mg Oral Q breakfast  . propofol      . simvastatin  20 mg Oral QHS  . vancomycin  750 mg Intravenous Q12H   Assessment: 62 yo F admitted with CAP on 01/08/2013 & started on Rocephin & Zithromax.  Pt's breathing & CXR continue to worsen.  WBC is also increasing but patient on steroids.  Cx data NGTD.  Renal function is stable.   Vancomycin 7/4>> Cefepime 7/4>>7/5 Levaquin 7/4>> Rocephin 7/2>>7/4 Zithromax 7/2>>7/4 Ceftazidime 7/5>>  Goal of Therapy:  Eradicate  infection.  Plan:  Vancomycin 750mg  IV q12h Ceftazidime 2g IV q8h Levaquin 750mg  IV q24h Monitor renal function and cx data   Mickeal Skinner 01/11/2013,1:40 PM

## 2013-01-12 ENCOUNTER — Inpatient Hospital Stay (HOSPITAL_COMMUNITY): Payer: Medicaid Other

## 2013-01-12 LAB — BASIC METABOLIC PANEL
BUN: 17 mg/dL (ref 6–23)
CO2: 32 mEq/L (ref 19–32)
Calcium: 8.1 mg/dL — ABNORMAL LOW (ref 8.4–10.5)
Creatinine, Ser: 0.86 mg/dL (ref 0.50–1.10)
GFR calc non Af Amer: 72 mL/min — ABNORMAL LOW (ref >90)
Glucose, Bld: 122 mg/dL — ABNORMAL HIGH (ref 70–99)
Sodium: 138 mEq/L (ref 135–145)

## 2013-01-12 LAB — C-REACTIVE PROTEIN: CRP: 32.7 mg/dL — ABNORMAL HIGH (ref <0.60)

## 2013-01-12 LAB — GLUCOSE, CAPILLARY
Glucose-Capillary: 114 mg/dL — ABNORMAL HIGH (ref 70–99)
Glucose-Capillary: 102 mg/dL — ABNORMAL HIGH (ref 70–99)
Glucose-Capillary: 102 mg/dL — ABNORMAL HIGH (ref 70–99)

## 2013-01-12 LAB — POCT I-STAT 3, ART BLOOD GAS (G3+)
Acid-Base Excess: 5 mmol/L — ABNORMAL HIGH (ref 0.0–2.0)
Bicarbonate: 30.2 mEq/L — ABNORMAL HIGH (ref 20.0–24.0)
O2 Saturation: 100 %
TCO2: 32 mmol/L (ref 0–100)
pO2, Arterial: 201 mmHg — ABNORMAL HIGH (ref 80.0–100.0)

## 2013-01-12 LAB — HEPATIC FUNCTION PANEL
ALT: 24 U/L (ref 0–35)
AST: 33 U/L (ref 0–37)
Albumin: 2.1 g/dL — ABNORMAL LOW (ref 3.5–5.2)
Alkaline Phosphatase: 51 U/L (ref 39–117)
Total Protein: 6.1 g/dL (ref 6.0–8.3)

## 2013-01-12 LAB — CULTURE, RESPIRATORY W GRAM STAIN: Culture: NORMAL

## 2013-01-12 LAB — RHEUMATOID FACTOR: Rhuematoid fact SerPl-aCnc: 39 IU/mL — ABNORMAL HIGH (ref <=14)

## 2013-01-12 LAB — CBC WITH DIFFERENTIAL/PLATELET
Basophils Absolute: 0 10*3/uL (ref 0.0–0.1)
Basophils Relative: 0 % (ref 0–1)
HCT: 24.8 % — ABNORMAL LOW (ref 36.0–46.0)
Lymphocytes Relative: 10 % — ABNORMAL LOW (ref 12–46)
Neutro Abs: 12.1 10*3/uL — ABNORMAL HIGH (ref 1.7–7.7)
Neutrophils Relative %: 88 % — ABNORMAL HIGH (ref 43–77)
Platelets: 206 10*3/uL (ref 150–400)
RDW: 14.2 % (ref 11.5–15.5)
WBC: 13.8 10*3/uL — ABNORMAL HIGH (ref 4.0–10.5)

## 2013-01-12 MED ORDER — SODIUM CHLORIDE 0.9 % IV BOLUS (SEPSIS)
500.0000 mL | Freq: Once | INTRAVENOUS | Status: AC
  Administered 2013-01-12: 500 mL via INTRAVENOUS

## 2013-01-12 MED ORDER — BIOTENE DRY MOUTH MT LIQD
15.0000 mL | Freq: Four times a day (QID) | OROMUCOSAL | Status: DC
  Administered 2013-01-12 – 2013-01-14 (×7): 15 mL via OROMUCOSAL

## 2013-01-12 MED ORDER — GUAIFENESIN 100 MG/5ML PO SOLN
5.0000 mL | ORAL | Status: DC | PRN
  Filled 2013-01-12: qty 5

## 2013-01-12 MED ORDER — ACETAMINOPHEN 160 MG/5ML PO SOLN
650.0000 mg | Freq: Four times a day (QID) | ORAL | Status: DC | PRN
  Administered 2013-01-13 – 2013-01-20 (×4): 650 mg via ORAL
  Filled 2013-01-12 (×5): qty 20.3

## 2013-01-12 MED ORDER — IPRATROPIUM BROMIDE 0.02 % IN SOLN
0.5000 mg | RESPIRATORY_TRACT | Status: DC
Start: 2013-01-12 — End: 2013-01-15
  Administered 2013-01-12 – 2013-01-15 (×17): 0.5 mg via RESPIRATORY_TRACT
  Filled 2013-01-12 (×18): qty 2.5

## 2013-01-12 MED ORDER — PANTOPRAZOLE SODIUM 40 MG PO PACK
40.0000 mg | PACK | Freq: Every day | ORAL | Status: DC
  Administered 2013-01-12 – 2013-01-16 (×5): 40 mg
  Filled 2013-01-12 (×5): qty 20

## 2013-01-12 MED ORDER — LEVALBUTEROL HCL 0.63 MG/3ML IN NEBU
0.6300 mg | INHALATION_SOLUTION | RESPIRATORY_TRACT | Status: DC
  Administered 2013-01-12 – 2013-01-15 (×17): 0.63 mg via RESPIRATORY_TRACT
  Filled 2013-01-12 (×34): qty 3

## 2013-01-12 MED ORDER — CHLORHEXIDINE GLUCONATE 0.12 % MT SOLN
15.0000 mL | Freq: Two times a day (BID) | OROMUCOSAL | Status: DC
Start: 2013-01-12 — End: 2013-01-14
  Administered 2013-01-12 – 2013-01-13 (×4): 15 mL via OROMUCOSAL
  Filled 2013-01-12 (×4): qty 15

## 2013-01-12 MED ORDER — LEVOFLOXACIN IN D5W 750 MG/150ML IV SOLN
750.0000 mg | INTRAVENOUS | Status: DC
  Administered 2013-01-12: 750 mg via INTRAVENOUS
  Filled 2013-01-12 (×2): qty 150

## 2013-01-12 NOTE — Progress Notes (Signed)
Recruitment maneuvers attempted PC 35, RR 10, PEEP 5, Fi02 100%, I:E 1:1.  Patient tolerated well for about 30 seconds and then began coughing.  Coughing continued for about 2 minutes.  Patient was suctioned twice for small to scant amount of secretions.  BBS more reveal more rhonchi than before.  Patient was returned to [facility] once more with same results.  PRVC vent settings resumed with Fi02 of 50%. BP stable throughout, sats now 92%.

## 2013-01-12 NOTE — Progress Notes (Signed)
eLink Physician-Brief Progress Note Patient Name: Cynthia [name] DOB: 12-29-1950 MRN: [id number]  Date of Service  01/12/2013   HPI/Events of Note  Sats consistently in the high 80s.  RT reports minimal secretions.   eICU Interventions  Plan: Recruitment maneuvers ordered.   Intervention Category Intermediate Interventions: Respiratory distress - evaluation and management  Kindel Rochefort 01/12/2013, 12:04 AM

## 2013-01-12 NOTE — Progress Notes (Signed)
PULMONARY  / CRITICAL CARE MEDICINE  Name: Cynthia [name] MRN: [id number] DOB: 19-Apr-1951    ADMISSION DATE:  01/08/2013 CONSULTATION DATE:  01/11/13  REFERRING MD:  Dr. Elvera Lennox, Internal medicine [facility]) PRIMARY SERVICE: CCM  CHIEF COMPLAINT:  Cough, SOB  BRIEF PATIENT DESCRIPTION:  62 year old female with chronic bronchitis, HTN, HLD who presented from [facility] with worsening PNA and acute respiratory failure.  SIGNIFICANT EVENTS / STUDIES:  7/2 - S. Pneumo and Legionella Urine Ag - Neg 7/4 Chest Xray - bilateral PNA 7/5 - Acute respiratory failure, Intubated and central line placed 7/5 Bronch - Sputum for culture; no pus noted; secretions minimal 7/5 CT chest - Airspace opacities and secondary pulmonary lobular septal thickening; ? ARDS vs Cardiogenic pulm edema vs Interstitial PNA  LINES / TUBES: ETT 7/5 >>>  R IJ 7/5>>  CULTURES: Sputum 7/5>> BCx 7/5 >> No growth  ANTIBIOTICS: Vanc 7/4>> Ceftriaxone 7/1 >> 7/4 Cefepime 7/4 >>7/5 Levaquin 7/4 >> Ceftaz 7/5 >>  SUBJECTIVE:  Desaturation overnight; improved with recruitment maneuvers Low BP (MAP ~58) overnight >>> responded to fluids  VITAL SIGNS: Temp:  [98.3 F (36.8 C)-101.6 F (38.7 C)] 99.2 F (37.3 C) (07/06 0728) Pulse Rate:  [82-118] 95 (07/06 0700) Resp:  [5-31] 13 (07/06 0700) BP: (87-140)/(46-110) 93/47 mmHg (07/06 0700) SpO2:  [88 %-100 %] 97 % (07/06 0700) FiO2 (%):  [40 %-100 %] 50 % (07/06 0600) HEMODYNAMICS: CVP:  [4 mmHg-10 mmHg] 9 mmHg VENTILATOR SETTINGS: Vent Mode:  [-] PRVC FiO2 (%):  [40 %-100 %] 50 % Set Rate:  [10 bmp-16 bmp] 10 bmp Vt Set:  [460 mL-500 mL] 460 mL PEEP:  [5 cmH20-10 cmH20] 5 cmH20 Plateau Pressure:  [13 cmH20-31 cmH20] 23 cmH20 INTAKE / OUTPUT: Intake/Output     07/05 0701 - 07/06 0700 07/06 0701 - 07/07 0700   I.V. (mL/kg) 922.7 (16.2) 118.6 (2.1)   Other 1100    NG/GT 390 40   IV Piggyback 550 150   Total Intake(mL/kg) 2962.7 (51.9) 308.6 (5.4)    Urine (mL/kg/hr) 885 (0.6) 250 (3.9)   Total Output 885 250   Net +2077.7 +58.6         PHYSICAL EXAMINATION: General:  Sedated on vent, coughing frequently Neuro: Sedated on Vent, follows commands, exam nonfocal HEENT: ETT tube, OGT in place Cardiovascular:  RRR. No m/r/g noted Lungs: Coarse breath sounds throughout. Abdomen: soft, nondistended.  No organomegaly noted Extremities: No edema appreciated. Skin:  Intact.  LABS:  Recent Labs Lab 01/08/13 0217 01/09/13 0528 01/10/13 5284 01/11/13 0338 01/11/13 0740 01/11/13 1414 01/11/13 1600 01/12/13 0400  HGB 11.1* 9.3* 9.6*  --   --   --  9.0* 8.4*  WBC 15.3* 17.8* 18.8*  --   --   --  16.5* 13.8*  PLT 224 201 225  --   --   --  206 206  NA 135 139 138  --   --   --  138 138  K 3.7 3.5 3.7  --   --   --  3.2* 4.1  CL 98 106 103  --   --   --  96 102  CO2 25 26 27   --   --   --  31 32  GLUCOSE 135* 119* 155*  --   --   --  161* 122*  BUN 13 9 6   --   --   --  9 17  CREATININE 0.77 0.70 0.66  --   --   --  0.74 0.86  CALCIUM 10.0 8.3* 9.1  --   --   --  8.5 8.1*  AST  --  31  --   --   --   --  41*  --   ALT  --  26  --   --   --   --  29  --   ALKPHOS  --  47  --   --   --   --  60  --   BILITOT  --  0.2*  --   --   --   --  0.3  --   PROT  --  6.6  --   --   --   --  6.8  --   ALBUMIN  --  3.3*  --   --   --   --  2.7*  --   INR  --   --   --   --   --   --  1.27  --   TROPONINI <0.30  --   --   --   --   --   --   --   PROBNP 247.0*  --   --   --   --   --   --   --   PHART  --   --   --  7.475* 7.448 7.495*  --   --   PCO2ART  --   --   --  37.4 42.4 42.9  --   --   PO2ART  --   --   --  47.3* 354.0* 408.0*  --   --     Recent Labs Lab 01/11/13 1539 01/11/13 2009 01/12/13 0021 01/12/13 0413 01/12/13 0722  GLUCAP 157* 123* 100* 121* 114*    CXR: ETT low (retract) and R IJ in good position, diffuse bilateral infiltrates  ASSESSMENT / PLAN:  PULMONARY A: Acute respiratory failure, r/o HCAP, , r/o pulm  edema component, r/o non infectious pneumonitis P:   CT chest - see above Cont Abx - Ceftaz, Vanc, Levaquin for HCAP Albuterol/Atrovent Q4 Awaiting studies and cultures  - HIV Antibody, PCP DFA, C3, C4, ANCA, ANA, DS DNA, anca, ana, dsdna, ua, rf, rf, compliment, ana Echo ordered ABG today, vent setting slow SBt planned, cpap5 ps 5, goal 2 hrs Lasix when BP tolerates Plat less 30 goals met  CARDIOVASCULAR A: Hemodynamically stable, No cardiac history, r/o valvular dysfunction P:  Tele DVT PPx - Lovenox Echo ordered to evaluate for underlying CHF which may be contributing BP low overnight (MAP 58); responded to IV fluids and decrease of Propofol; Lasix D/C'd cvp 9 noted  RENAL A:  No Hx of renal disease, Creatinine normal P:   Creatinine WNL Monitor closely via BMP Monitor volume status CVP 9 today, but BP low, holding lasix  GASTROINTESTINAL A:  SUP PPx P:   Protonix for SUP Continue TF    HEMATOLOGIC A:  DVT PPx, Leukocytosis secondary to HCAP P:  WBC count improving Lovenox for DVT PPx- follow crt closely  INFECTIOUS A:  Bilateral infiltrates >>HCAP (given hospitalization since end june), pcp? P:   Continuing abx - Vanc/Levaquin/Ceftaz Follow bronch results Bronch pcp, leg dfa, cell count diff - pending Daily chest xray Will trend CBC  ENDOCRINE A: No issues currently P:   Obtaining Immunological work up given severity of illness unclear nature of infiltrates (see above) CBG Q6; CBG's stable  NEUROLOGIC A:  Acute encephalopathy secondary to Acute respiratory  failure P:   Sedated Vent;  propofol, fent WUA daily Goal Rass score 0 to -1  TODAY'S SUMMARY: Awaiting lab results and cultures, Continuing Abx, ABG today, wean planned, lasix when able, echo important to check  Everlene Other DO Family Medicine PGY-2  01/12/2013, 8:08 AM  Ccm time 40 min  I have fully examined this patient and agree with above findings.    And edite di nfull   Mcarthur Rossetti.  Tyson Alias, MD, FACP Pgr: [phone] [facility]

## 2013-01-12 NOTE — Progress Notes (Signed)
  Echocardiogram 2D Echocardiogram has been performed.  Georgian Co 01/12/2013, 5:58 PM

## 2013-01-13 ENCOUNTER — Inpatient Hospital Stay (HOSPITAL_COMMUNITY): Payer: Medicaid Other

## 2013-01-13 LAB — POCT I-STAT 3, ART BLOOD GAS (G3+)
Acid-Base Excess: 3 mmol/L — ABNORMAL HIGH (ref 0.0–2.0)
O2 Saturation: 99 %

## 2013-01-13 LAB — PNEUMOCYSTIS JIROVECI SMEAR BY DFA: Pneumocystis jiroveci Ag: NEGATIVE

## 2013-01-13 LAB — C4 COMPLEMENT: Complement C4, Body Fluid: 33 mg/dL (ref 10–40)

## 2013-01-13 LAB — BASIC METABOLIC PANEL
BUN: 16 mg/dL (ref 6–23)
Calcium: 7.9 mg/dL — ABNORMAL LOW (ref 8.4–10.5)
GFR calc Af Amer: >90 mL/min (ref >90)
GFR calc non Af Amer: >90 mL/min (ref >90)
Glucose, Bld: 140 mg/dL — ABNORMAL HIGH (ref 70–99)

## 2013-01-13 LAB — CBC WITH DIFFERENTIAL/PLATELET
Basophils Absolute: 0 10*3/uL (ref 0.0–0.1)
Basophils Relative: 0 % (ref 0–1)
Eosinophils Absolute: 0.5 10*3/uL (ref 0.0–0.7)
MCH: 27.2 pg (ref 26.0–34.0)
MCHC: 33.3 g/dL (ref 30.0–36.0)
Neutrophils Relative %: 84 % — ABNORMAL HIGH (ref 43–77)
Platelets: 206 10*3/uL (ref 150–400)
RDW: 14.2 % (ref 11.5–15.5)

## 2013-01-13 LAB — GLUCOSE, CAPILLARY
Glucose-Capillary: 139 mg/dL — ABNORMAL HIGH (ref 70–99)
Glucose-Capillary: 125 mg/dL — ABNORMAL HIGH (ref 70–99)

## 2013-01-13 LAB — C3 COMPLEMENT: C3 Complement: 171 mg/dL (ref 90–180)

## 2013-01-13 LAB — ANA: Anti Nuclear Antibody(ANA): NEGATIVE

## 2013-01-13 MED ORDER — FUROSEMIDE 10 MG/ML IJ SOLN
20.0000 mg | Freq: Four times a day (QID) | INTRAMUSCULAR | Status: DC
  Administered 2013-01-13 (×2): 20 mg via INTRAVENOUS
  Filled 2013-01-13 (×2): qty 2

## 2013-01-13 MED ORDER — VITAL AF 1.2 CAL PO LIQD
1000.0000 mL | ORAL | Status: DC
  Administered 2013-01-13: 1000 mL
  Filled 2013-01-13 (×3): qty 1000

## 2013-01-13 MED ORDER — BIOTENE DRY MOUTH MT LIQD
15.0000 mL | Freq: Four times a day (QID) | OROMUCOSAL | Status: DC
  Administered 2013-01-13 – 2013-01-16 (×13): 15 mL via OROMUCOSAL

## 2013-01-13 MED ORDER — PRO-STAT SUGAR FREE PO LIQD
30.0000 mL | Freq: Three times a day (TID) | ORAL | Status: DC
  Administered 2013-01-13 – 2013-01-14 (×4): 30 mL
  Filled 2013-01-13 (×6): qty 30

## 2013-01-13 MED ORDER — POTASSIUM CHLORIDE 20 MEQ/15ML (10%) PO LIQD
20.0000 meq | ORAL | Status: AC
  Administered 2013-01-13 (×2): 20 meq
  Filled 2013-01-13 (×2): qty 15

## 2013-01-13 MED ORDER — FENTANYL CITRATE 0.05 MG/ML IJ SOLN: 50.0000 ug | INTRAMUSCULAR | Status: DC | PRN

## 2013-01-13 MED ORDER — POTASSIUM CHLORIDE 20 MEQ/15ML (10%) PO LIQD
40.0000 meq | Freq: Three times a day (TID) | ORAL | Status: AC
  Administered 2013-01-13 (×2): 40 meq
  Filled 2013-01-13 (×2): qty 30

## 2013-01-13 MED ORDER — CHLORHEXIDINE GLUCONATE 0.12 % MT SOLN
15.0000 mL | Freq: Two times a day (BID) | OROMUCOSAL | Status: DC
  Administered 2013-01-13 – 2013-01-16 (×6): 15 mL via OROMUCOSAL
  Filled 2013-01-13 (×5): qty 15

## 2013-01-13 MED ORDER — MIDAZOLAM HCL 2 MG/2ML IJ SOLN: 2.0000 mg | INTRAMUSCULAR | Status: DC | PRN

## 2013-01-13 NOTE — Progress Notes (Signed)
[facility] ADULT ICU REPLACEMENT PROTOCOL FOR AM LAB REPLACEMENT ONLY  The patient does apply for the [facility] Adult ICU Electrolyte Replacment Protocol based on the criteria listed below:   1. Is GFR >/= 40 ml/min? yes  Patient's GFR today is >90 2. Is urine output >/= 0.5 ml/kg/hr for the last 6 hours? yes Patient's UOP is 0.88 ml/kg/hr 3. Is BUN < 60 mg/dL? yes  Patient's BUN today is 16 4. Abnormal electrolyte(s): K 3.3 5. Ordered repletion with: per protocol 6. If a panic level lab has been reported, has the CCM MD in charge been notified? yes.   Physician:  Dr Bea Laura Deterding  [place] 01/13/2013 6:31 AM

## 2013-01-13 NOTE — Progress Notes (Signed)
PULMONARY  / CRITICAL CARE MEDICINE  Name: Cynthia [name] MRN: [id number] DOB: 1950-11-26    ADMISSION DATE:  01/08/2013 CONSULTATION DATE:  01/11/13  REFERRING MD:  Dr. Elvera Lennox, Internal medicine [facility]) PRIMARY SERVICE: CCM  CHIEF COMPLAINT:  Cough, SOB  BRIEF PATIENT DESCRIPTION:  62 year old female with chronic bronchitis, HTN, HLD who presented from [facility] with worsening PNA and acute respiratory failure.  SIGNIFICANT EVENTS / STUDIES:  7/2 - S. Pneumo and Legionella Urine Ag - Neg 7/4 Chest Xray - bilateral PNA 7/5 - Acute respiratory failure, Intubated and central line placed 7/5 Bronch - Sputum for culture; no pus noted; secretions minimal 7/5 CT chest - Airspace opacities and secondary pulmonary lobular septal thickening; ? ARDS vs Cardiogenic pulm edema vs Interstitial PNA 7/6 - Febrile overnight  LINES / TUBES: ETT 7/5 >>>  R IJ 7/5>>  CULTURES: Sputum 7/5>> No growth BCx 7/5 >> No growth  ANTIBIOTICS: Vanc 7/4>> Ceftriaxone 7/2 >> 7/4 Azithromycin 7/2 >>7/4 Cefepime 7/4 >>7/5 Levaquin 7/4 >>7/7 Ceftaz 7/5 >>  SUBJECTIVE:  Sedated on vent Follows commands  VITAL SIGNS: Temp:  [98 F (36.7 C)-102.9 F (39.4 C)] 98.3 F (36.8 C) (07/07 0441) Pulse Rate:  [73-118] 88 (07/07 0600) Resp:  [10-42] 16 (07/07 0600) BP: (86-112)/(46-63) 89/56 mmHg (07/07 0600) SpO2:  [83 %-100 %] 100 % (07/07 0600) FiO2 (%):  [50 %-60 %] 60 % (07/07 0600) Weight:  [128 lb 8.5 oz (58.3 kg)] 128 lb 8.5 oz (58.3 kg) (07/07 0457)  HEMODYNAMICS: CVP:  [6 mmHg-10 mmHg] 9 mmHg  VENTILATOR SETTINGS: Vent Mode:  [-] PRVC FiO2 (%):  [50 %-60 %] 60 % Set Rate:  [10 bmp] 10 bmp Vt Set:  [460 mL] 460 mL PEEP:  [5 cmH20] 5 cmH20 Plateau Pressure:  [19 cmH20-29 cmH20] 29 cmH20  INTAKE / OUTPUT: Intake/Output     07/06 0701 - 07/07 0700 07/07 0701 - 07/08 0700   I.V. (mL/kg) 2517.8 (43.2)    Other     NG/GT 920    IV Piggyback 600    Total Intake(mL/kg) 4037.8 (69.3)     Urine (mL/kg/hr) 1715 (1.2)    Total Output 1715     Net +2322.8           PHYSICAL EXAMINATION: General:  Sedated on vent, NAD. Neuro: Sedated on Vent, follows commands, exam nonfocal HEENT: ETT tube, OGT in place Cardiovascular:  Tachy; Regular rhythm no m/r/g noted Lungs: Coarse breath sounds throughout with crackles noted on Left Abdomen: soft, nondistended.  No organomegaly noted. +BS Extremities: No LE edema appreciated. Skin:  Intact.  LABS:  Recent Labs Lab 01/08/13 0217 01/09/13 0528 01/10/13 0605  01/11/13 0740 01/11/13 1414 01/11/13 1600 01/12/13 0400 01/12/13 0500 01/12/13 0932 01/13/13 0500  HGB 11.1* 9.3* 9.6*  --   --   --  9.0* 8.4*  --   --  8.0*  WBC 15.3* 17.8* 18.8*  --   --   --  16.5* 13.8*  --   --  13.3*  PLT 224 201 225  --   --   --  206 206  --   --  206  NA 135 139 138  --   --   --  138 138  --   --  136  K 3.7 3.5 3.7  --   --   --  3.2* 4.1  --   --  3.3*  CL 98 106 103  --   --   --  96 102  --   --  100  CO2 25 26 27   --   --   --  31 32  --   --  29  GLUCOSE 135* 119* 155*  --   --   --  161* 122*  --   --  140*  BUN 13 9 6   --   --   --  9 17  --   --  16  CREATININE 0.77 0.70 0.66  --   --   --  0.74 0.86  --   --  0.62  CALCIUM 10.0 8.3* 9.1  --   --   --  8.5 8.1*  --   --  7.9*  AST  --  31  --   --   --   --  41*  --  33  --   --   ALT  --  26  --   --   --   --  29  --  24  --   --   ALKPHOS  --  47  --   --   --   --  60  --  51  --   --   BILITOT  --  0.2*  --   --   --   --  0.3  --  0.2*  --   --   PROT  --  6.6  --   --   --   --  6.8  --  6.1  --   --   ALBUMIN  --  3.3*  --   --   --   --  2.7*  --  2.1*  --   --   INR  --   --   --   --   --   --  1.27  --   --   --   --   TROPONINI <0.30  --   --   --   --   --   --   --   --   --   --   PROBNP 247.0*  --   --   --   --   --   --   --   --   --   --   PHART  --   --   --   < > 7.448 7.495*  --   --   --  7.411  --   PCO2ART  --   --   --   < > 42.4 42.9  --   --    --  47.5*  --   PO2ART  --   --   --   < > 354.0* 408.0*  --   --   --  201.0*  --   < > = values in this interval not displayed.  Recent Labs Lab 01/12/13 1119 01/12/13 1536 01/12/13 1932 01/12/13 2356 01/13/13 0427  GLUCAP 102* 117* 102* 99 129*   CXR: ETT 1.5 cm above carina (will retract 1cm); worsening bilateral infiltrates  ASSESSMENT / PLAN:   PULMONARY A: Acute respiratory failure, r/o HCAP, r/o pulm edema component, r/o non infectious pneumonitis P:   - Cont Abx - Ceftaz and Vanc for HCAP. - Albuterol/Atrovent Q4. - Cultures negative thus far, if remains negative til AM will start high dose steroids. - HIV antibody neg, RF elevated at 39, consider steroids in AM. - Awaiting studies - PCP DFA, C3,  C4, ANCA, ANA, DS DNA. - Echo completed; awaiting read, BNP 247. - Increase PEEP to 10 and RR to 16 with F/U ABG.  CARDIOVASCULAR A: Hemodynamically stable, No cardiac history, r/o valvular dysfunction P:  - DVT PPx - Lovenox. - Echo complete and pending. - Low dose diureses.  RENAL A:  No Hx of renal disease, Creatinine normal; Hypokalemia P:   - Creatinine WNL. - Daily BMP. - Repleting potassium.  GASTROINTESTINAL A:  SUP PPx P:   - Protonix for SUP. - Continue TF.  HEMATOLOGIC A:  DVT PPx, Leukocytosis secondary to HCAP P:  - Lovenox for DVT PPx- follow crt closely.  INFECTIOUS A:  Bilateral infiltrates >>HCAP (given hospitalization since end june), pcp versus auto-immune pneumonitis. P:   - Continuing abx - Vanc/Ceftaz, d/c levaquin. - Cultures negative thus far. - Awaiting studies and Echo. - Daily chest xray.  ENDOCRINE A: No issues currently P:   - Obtaining Immunological work up given severity of illness unclear nature of infiltrates (see above) - CBG Q6; CBG's stable  NEUROLOGIC A:  Acute encephalopathy secondary to Acute respiratory failure P:   - Sedated intermittent sedation. - WUA daily. - Goal Rass score 0 to -1.  TODAY'S  SUMMARY: Continuing abx and awaiting study results, awaiting Echo read  Everlene Other DO Family Medicine PGY-2  01/13/2013, 7:40 AM  CC time 35 min.  Patient seen and examined, agree with above note.  I dictated the care and orders written for this patient under my direction.  Alyson Reedy, MD [phone]

## 2013-01-13 NOTE — Progress Notes (Signed)
INITIAL NUTRITION ASSESSMENT  DOCUMENTATION CODES Per approved criteria  -Not Applicable   INTERVENTION: Decrease Vital AF 1.2 to new goal rate of 30 ml/hr. 30 ml Prostat TID  TF regimen providing 1164 kcal, 99 grams protein, and 583 ml H2O.   TF regimen and propofol at current rate providing 1393 total kcal/day (100 % of kcal needs)  NUTRITION DIAGNOSIS: Inadequate oral intake related to inability to eat as evidenced by NPO status.  Goal: Pt to meet >/= 90% of their estimated nutrition needs.   Monitor:  Vent status, TF tolerance, weight trend, labs   Reason for Assessment: Consult received to initiate and manage enteral nutrition support.  62 y.o. female  Admitting Dx: Acute respiratory failure with hypoxia  ASSESSMENT:  Patient is currently intubated on ventilator support.  MV: 7 L/min Temp:Temp (24hrs), Avg:99.6 F (37.6 C), Min:98 F (36.7 C), Max:102.9 F (39.4 C)  Propofol: 8.7 ml/hr providing 229 kcal from lipid per day   Height: Ht Readings from Last 1 Encounters:  01/11/13 5\' 5"  (1.651 m)    Weight: Wt Readings from Last 1 Encounters:  01/13/13 128 lb 8.5 oz (58.3 kg)    Ideal Body Weight: 56.8 kg  % Ideal Body Weight: 103%  Wt Readings from Last 10 Encounters:  01/13/13 128 lb 8.5 oz (58.3 kg)  06/25/12 120 lb (54.432 kg)  12/30/11 119 lb (53.978 kg)  09/11/11 113 lb (51.256 kg)  07/30/11 113 lb (51.256 kg)  05/30/11 120 lb (54.432 kg)  01/24/11 121 lb (54.885 kg)  11/26/08 124 lb (56.246 kg)  11/25/08 125 lb (56.7 kg)  11/11/08 126 lb (57.153 kg)    Usual Body Weight: 120 lb   % Usual Body Weight: > 100%  BMI:  Body mass index is 21.39 kg/(m^2).  Estimated Nutritional Needs: Kcal: 1396 Protein: 85-100 grams Fluid: > 1.5 L/day  Skin: no issues noted  Diet Order:   NPO  EDUCATION NEEDS: -No education needs identified at this time   Intake/Output Summary (Last 24 hours) at 01/13/13 0851 Last data filed at 01/13/13 0600  Gross per 24 hour  Intake 3729.2 ml  Output   1465 ml  Net 2264.2 ml    Last BM: 7/4   Labs:   Recent Labs Lab 01/11/13 1600 01/12/13 0400 01/13/13 0500  NA 138 138 136  K 3.2* 4.1 3.3*  CL 96 102 100  CO2 31 32 29  BUN 9 17 16   CREATININE 0.74 0.86 0.62  CALCIUM 8.5 8.1* 7.9*  GLUCOSE 161* 122* 140*    CBG (last 3)   Recent Labs  01/12/13 2356 01/13/13 0427 01/13/13 0809  GLUCAP 99 129* 139*   No results found for this basename: HGBA1C   Scheduled Meds: . antiseptic oral rinse  15 mL Mouth Rinse QID  . cefTAZidime (FORTAZ)  IV  2 g Intravenous Q8H  . chlorhexidine  15 mL Mouth Rinse BID  . enoxaparin (LOVENOX) injection  40 mg Subcutaneous Q24H  . feeding supplement (VITAL AF 1.2 CAL)  1,000 mL Per Tube Q24H  . ipratropium  0.5 mg Nebulization Q4H  . levalbuterol  0.63 mg Nebulization Q4H  . levofloxacin (LEVAQUIN) IV  750 mg Intravenous Q24H  . pantoprazole sodium  40 mg Per Tube Daily  . potassium chloride  20 mEq Per Tube Q4H  . simvastatin  20 mg Oral QHS  . vancomycin  750 mg Intravenous Q12H    Continuous Infusions: . sodium chloride 50 mL/hr at 01/13/13 0831  .  fentaNYL infusion INTRAVENOUS 100 mcg/hr (01/12/13 1344)  . propofol 25 mcg/kg/min (01/13/13 8657)    Past Medical History  Diagnosis Date  . Migraine   . Hypertension   . Bronchitis   . High blood cholesterol level     Past Surgical History  Procedure Laterality Date  . Left arm      Kendell Bane RD, LDN, CNSC [phone] Pager [phone] After Hours Pager

## 2013-01-13 NOTE — Procedures (Signed)
Arterial Catheter Insertion Procedure Note CATRICE ZULETA [id number] 01/05/1951  Procedure: Insertion of Arterial Catheter  Indications: Blood pressure monitoring  Procedure Details Consent: Risks of procedure as well as the alternatives and risks of each were explained to the (patient/caregiver).  Consent for procedure obtained. Time Out: Verified patient identification, verified procedure, site/side was marked, verified correct patient position, special equipment/implants available, medications/allergies/relevent history reviewed, required imaging and test results available.  Performed  Maximum sterile technique was used including antiseptics, cap, gloves, gown, hand hygiene, mask and sheet. Skin prep: Chlorhexidine; local anesthetic administered 20 gauge catheter was inserted into left radial artery using the Seldinger technique.  Evaluation Blood flow good; BP tracing good. Complications: No apparent complications.   Koren Bound 01/13/2013

## 2013-01-14 ENCOUNTER — Inpatient Hospital Stay (HOSPITAL_COMMUNITY): Payer: Medicaid Other

## 2013-01-14 LAB — BLOOD GAS, ARTERIAL
Bicarbonate: 30.5 mEq/L — ABNORMAL HIGH (ref 20.0–24.0)
MECHVT: 460 mL
TCO2: 32.2 mmol/L (ref 0–100)
pCO2 arterial: 55.9 mmHg — ABNORMAL HIGH (ref 35.0–45.0)
pH, Arterial: 7.36 (ref 7.350–7.450)

## 2013-01-14 LAB — CULTURE, RESPIRATORY W GRAM STAIN

## 2013-01-14 LAB — GLUCOSE, CAPILLARY
Glucose-Capillary: 147 mg/dL — ABNORMAL HIGH (ref 70–99)
Glucose-Capillary: 113 mg/dL — ABNORMAL HIGH (ref 70–99)
Glucose-Capillary: 142 mg/dL — ABNORMAL HIGH (ref 70–99)
Glucose-Capillary: 173 mg/dL — ABNORMAL HIGH (ref 70–99)

## 2013-01-14 LAB — VANCOMYCIN, TROUGH: Vancomycin Tr: 12 ug/mL (ref 10.0–20.0)

## 2013-01-14 LAB — PHOSPHORUS: Phosphorus: 2.5 mg/dL (ref 2.3–4.6)

## 2013-01-14 LAB — BASIC METABOLIC PANEL
BUN: 23 mg/dL (ref 6–23)
Calcium: 8.7 mg/dL (ref 8.4–10.5)
Creatinine, Ser: 0.7 mg/dL (ref 0.50–1.10)
GFR calc Af Amer: >90 mL/min (ref >90)
GFR calc non Af Amer: >90 mL/min (ref >90)

## 2013-01-14 LAB — CBC
MCHC: 33.9 g/dL (ref 30.0–36.0)
Platelets: 231 10*3/uL (ref 150–400)
RDW: 14.5 % (ref 11.5–15.5)

## 2013-01-14 LAB — MAGNESIUM: Magnesium: 2 mg/dL (ref 1.5–2.5)

## 2013-01-14 MED ORDER — POTASSIUM CHLORIDE 20 MEQ/15ML (10%) PO LIQD
40.0000 meq | Freq: Three times a day (TID) | ORAL | Status: AC
  Administered 2013-01-14 (×2): 40 meq
  Filled 2013-01-14 (×2): qty 30

## 2013-01-14 MED ORDER — VANCOMYCIN HCL IN DEXTROSE 1-5 GM/200ML-% IV SOLN
1000.0000 mg | Freq: Two times a day (BID) | INTRAVENOUS | Status: AC
  Administered 2013-01-14 – 2013-01-19 (×10): 1000 mg via INTRAVENOUS
  Filled 2013-01-14 (×10): qty 200

## 2013-01-14 MED ORDER — VITAL AF 1.2 CAL PO LIQD
1000.0000 mL | ORAL | Status: DC
  Administered 2013-01-14 – 2013-01-15 (×2): 1000 mL
  Filled 2013-01-14 (×4): qty 1000

## 2013-01-14 MED ORDER — FUROSEMIDE 10 MG/ML IJ SOLN
40.0000 mg | Freq: Four times a day (QID) | INTRAMUSCULAR | Status: AC
  Administered 2013-01-14 (×3): 40 mg via INTRAVENOUS
  Filled 2013-01-14 (×3): qty 4

## 2013-01-14 MED ORDER — METHYLPREDNISOLONE SODIUM SUCC 125 MG IJ SOLR
60.0000 mg | Freq: Four times a day (QID) | INTRAMUSCULAR | Status: DC
  Administered 2013-01-14 – 2013-01-16 (×8): 60 mg via INTRAVENOUS
  Filled 2013-01-14 (×7): qty 0.96
  Filled 2013-01-14: qty 2
  Filled 2013-01-14 (×5): qty 0.96

## 2013-01-14 MED ORDER — SODIUM CHLORIDE 0.9 % IV BOLUS (SEPSIS)
500.0000 mL | Freq: Once | INTRAVENOUS | Status: AC
  Administered 2013-01-14: 500 mL via INTRAVENOUS

## 2013-01-14 MED ORDER — MIDAZOLAM HCL 2 MG/2ML IJ SOLN
1.0000 mg | INTRAMUSCULAR | Status: DC | PRN
  Administered 2013-01-14: 2 mg via INTRAVENOUS
  Administered 2013-01-14: 1 mg via INTRAVENOUS
  Administered 2013-01-15: 2 mg via INTRAVENOUS
  Filled 2013-01-14 (×3): qty 2

## 2013-01-14 MED ORDER — NOREPINEPHRINE BITARTRATE 1 MG/ML IJ SOLN
2.0000 ug/min | INTRAVENOUS | Status: DC
  Administered 2013-01-14: 1 ug/min via INTRAVENOUS
  Filled 2013-01-14 (×2): qty 4

## 2013-01-14 NOTE — Progress Notes (Signed)
PULMONARY  / CRITICAL CARE MEDICINE  Name: Cynthia [name] MRN: [id number] DOB: 1951-01-05    ADMISSION DATE:  01/08/2013 CONSULTATION DATE:  01/11/13  REFERRING MD:  Dr. Elvera Lennox, Internal medicine [facility]) PRIMARY SERVICE: CCM  CHIEF COMPLAINT:  Cough, SOB  BRIEF PATIENT DESCRIPTION:  62 year old female with chronic bronchitis, HTN, HLD who presented from [facility] with worsening PNA and acute respiratory failure.  SIGNIFICANT EVENTS / STUDIES:  7/2 - S. Pneumo and Legionella Urine Ag - Neg 7/4 Chest Xray - bilateral PNA 7/5 - Acute respiratory failure, Intubated and central line placed 7/5 Bronch - Sputum for culture; no pus noted; secretions minimal 7/5 CT chest - Airspace opacities and secondary pulmonary lobular septal thickening; ? ARDS vs Cardiogenic pulm edema vs Interstitial PNA 7/6 - Febrile overnight 7/7 - HIV antibody - negative; RF elevated at 39.  7/8 - Legionella DFA negative; Echo - EF 55-60%, Grade 1 Diastolic dysfunction; PCP Smear - negative; C3 171 (norm); C4 33 (norm); c-ANCA, p-ANCA, atypical p-ANCA -  Negative; ANA - Negative.   LINES / TUBES: ETT 7/5 >>>  R IJ 7/5>>  CULTURES: Sputum (Bronch) 7/5>> No growth BCx 7/5 >> No growth  ANTIBIOTICS: Vanc 7/4>> Ceftriaxone 7/2 >> 7/4 Azithromycin 7/2 >>7/4 Cefepime 7/4 >>7/5 Levaquin 7/4 >>7/7 Ceftaz 7/5 >>  SUBJECTIVE:   Overnight events - Diuresis stopped secondary to Low MAP  This am - patient sedated on vent, following commands.  VITAL SIGNS: Temp:  [99.1 F (37.3 C)-101.7 F (38.7 C)] 99.5 F (37.5 C) (07/08 0331) Pulse Rate:  [83-122] 94 (07/08 0600) Resp:  [14-34] 17 (07/08 0600) BP: (73-142)/(38-76) 107/59 mmHg (07/08 0600) SpO2:  [67 %-100 %] 97 % (07/08 0600) Arterial Line BP: (71-142)/(36-75) 108/48 mmHg (07/08 0600) FiO2 (%):  [50 %-60 %] 50 % (07/08 0600) Weight:  [130 lb 8.2 oz (59.2 kg)] 130 lb 8.2 oz (59.2 kg) (07/08 0500)  HEMODYNAMICS: CVP:  [6 mmHg-10 mmHg] 10  mmHg  VENTILATOR SETTINGS: Vent Mode:  [-] PRVC FiO2 (%):  [50 %-60 %] 50 % Set Rate:  [10 bmp-16 bmp] 16 bmp Vt Set:  [460 mL] 460 mL PEEP:  [5 cmH20-8 cmH20] 8 cmH20 Plateau Pressure:  [25 cmH20-37 cmH20] 27 cmH20  INTAKE / OUTPUT: Intake/Output     07/07 0701 - 07/08 0700 07/08 0701 - 07/09 0700   I.V. (mL/kg) 1022 (17.3)    NG/GT 743    IV Piggyback 450    Total Intake(mL/kg) 2215 (37.4)    Urine (mL/kg/hr) 2705 (1.9)    Total Output 2705     Net -490           PHYSICAL EXAMINATION: General:  Sedated on vent, NAD. Neuro: Sedated on Vent, follows commands, exam nonfocal HEENT: ETT tube, OGT in place Cardiovascular:  Tachy; Regular rhythm no m/r/g noted Lungs: Coarse breath sounds throughout. Abdomen: soft, nondistended.  No organomegaly noted. +BS Extremities: No LE edema appreciated. Skin:  Intact.  LABS:  Recent Labs Lab 01/08/13 0217 01/09/13 0528 01/10/13 0605  01/11/13 1600 01/12/13 0400 01/12/13 0500 01/12/13 0932 01/13/13 0500 01/13/13 1213 01/14/13 0230 01/14/13 0430  HGB 11.1* 9.3* 9.6*  --  9.0* 8.4*  --   --  8.0*  --   --  7.6*  WBC 15.3* 17.8* 18.8*  --  16.5* 13.8*  --   --  13.3*  --   --  14.3*  PLT 224 201 225  --  206 206  --   --  206  --   --  231  NA 135 139 138  --  138 138  --   --  136  --   --  142  K 3.7 3.5 3.7  --  3.2* 4.1  --   --  3.3*  --   --  3.9  CL 98 106 103  --  96 102  --   --  100  --   --  105  CO2 25 26 27   --  31 32  --   --  29  --   --  29  GLUCOSE 135* 119* 155*  --  161* 122*  --   --  140*  --   --  124*  BUN 13 9 6   --  9 17  --   --  16  --   --  23  CREATININE 0.77 0.70 0.66  --  0.74 0.86  --   --  0.62  --   --  0.70  CALCIUM 10.0 8.3* 9.1  --  8.5 8.1*  --   --  7.9*  --   --  8.7  MG  --   --   --   --   --   --   --   --   --   --   --  2.0  PHOS  --   --   --   --   --   --   --   --   --   --   --  2.5  AST  --  31  --   --  41*  --  33  --   --   --   --   --   ALT  --  26  --   --  29  --  24   --   --   --   --   --   ALKPHOS  --  47  --   --  60  --  51  --   --   --   --   --   BILITOT  --  0.2*  --   --  0.3  --  0.2*  --   --   --   --   --   PROT  --  6.6  --   --  6.8  --  6.1  --   --   --   --   --   ALBUMIN  --  3.3*  --   --  2.7*  --  2.1*  --   --   --   --   --   INR  --   --   --   --  1.27  --   --   --   --   --   --   --   TROPONINI <0.30  --   --   --   --   --   --   --   --   --   --   --   PROBNP 247.0*  --   --   --   --   --   --   --   --   --   --   --   PHART  --   --   --   < >  --   --   --  7.411  --  7.317* 7.360  --   PCO2ART  --   --   --   < >  --   --   --  47.5*  --  57.8* 55.9*  --   PO2ART  --   --   --   < >  --   --   --  201.0*  --  157.0* 90.0  --   < > = values in this interval not displayed.  Recent Labs Lab 01/13/13 1129 01/13/13 1608 01/13/13 2003 01/14/13 0012 01/14/13 0330  GLUCAP 125* 134* 143* 107* 135*   CXR: ETT 1.5 cm above carina (will retract 1cm); worsening bilateral infiltrates  ASSESSMENT / PLAN:   PULMONARY A: Acute respiratory failure, r/o HCAP, r/o pulm edema component, r/o non infectious pneumonitis P:   - Cont Abx - Ceftaz and Vanc for HCAP. - Albuterol/Atrovent Q4. - Cultures negative.  - Echo revealed preserved EF with Grade 1 Diastolic dysfunction. - Awaiting DS DNA. Remainder of studies negative other than elevated RF. - Will start IV Solumedrol today for possible immunological/autoimmune pathology.  - Increasing PEEP 10, FIO2 50% - Also restarting Diuresis (IV Lasix 40 x 3)  CARDIOVASCULAR A: Hemodynamically stable, No cardiac history, r/o valvular dysfunction P:  - DVT PPx - Lovenox. - Echo revealed preserved EF with Grade 1 Diastolic dysfunction. - Low dose diuresis was done on 7/8.  Neg 490 mL over past 24 hours.  Diuresis had to be discontinued overnight secondary to low MAP. - Restarting Lasix.  Levophed ordered to keep MAP >65  RENAL A:  No Hx of renal disease, Creatinine normal;  Hypokalemia P:   - Creatinine WNL. - Will continue to monitor via daily BMP.  GASTROINTESTINAL A:  SUP PPx P:   - Protonix for SUP. - Continue TF.  HEMATOLOGIC A:  DVT PPx, Leukocytosis secondary to HCAP P:  - Lovenox for DVT PPx- follow crt closely.  INFECTIOUS A:  Bilateral infiltrates >>HCAP (given hospitalization since end june), ?auto-immune pneumonitis. P:   - Continuing abx - Vanc/Ceftaz.  - Cultures continue to be negative.   - Daily chest xray.  ENDOCRINE A: No issues currently P:   - CBG Q6; CBG's stable  NEUROLOGIC A:  Acute encephalopathy secondary to Acute respiratory failure P:   - Sedation - Fentanyl. Propofol D/C'd - Goal Rass score 0.  - WUA daily  TODAY'S SUMMARY: Continuing abx, Starting corticosteroids today, Diuresis (above), adding Levophed  Everlene Other DO Family Medicine PGY-2  01/14/2013, 7:05 AM  Patient has failed to show much improvement with abx and diureses.  No evidence of infection, will start high dose steroids and start pressors and diureses.  Avoid fluid resuscitation given fluid overload.  CC time 35 min.  Patient seen and examined, agree with above note.  I dictated the care and orders written for this patient under my direction.  Alyson Reedy, MD [phone]

## 2013-01-14 NOTE — Progress Notes (Signed)
Called by radiology, ng tube in patients oesophagus, pushed in and placement checked

## 2013-01-14 NOTE — Progress Notes (Addendum)
Dr. Tyson Alias notified of low BP and low MAP, bolus 500 cc NS ordered will continue to monitor patient.  Corliss Skains RN

## 2013-01-14 NOTE — Progress Notes (Signed)
NUTRITION FOLLOW UP  Intervention:   Increase Vital AF 1.2 by 10 ml every 4 hours to goal rate of 50 ml/hr. D/C Prostat  TF regimen will provide 1440 kcal (97% of needs), 90 grams protein (>100% of needs), and 973 ml H2O.   Nutrition Dx:   Inadequate oral intake related to inability to eat as evidenced by NPO status; ongoing.   Goal:  Pt to meet >/= 90% of their estimated nutrition needs; met.   Monitor:  Vent status, TF tolerance, weight trend, labs   Assessment:   Pt admitted with chronic bronchitis and worsening PNA. Pt started on IV Solumedrol for possible autoimmune pathology.  Patient is currently intubated on ventilator support.  MV: 10 L/min Temp:Temp (24hrs), Avg:99.8 F (37.7 C), Min:99 F (37.2 C), Max:101.7 F (38.7 C)  Propofol: now off   Height: Ht Readings from Last 1 Encounters:  01/11/13 5\' 5"  (1.651 m)    Weight Status:   Wt Readings from Last 1 Encounters:  01/14/13 130 lb 8.2 oz (59.2 kg)  Admission weight 125 lb 7/2  Re-estimated needs:  Kcal: 1491 Protein: 85-100 grams  Fluid: > 1.6 L/day  Skin: no issues noted  Diet Order:   NPO   Intake/Output Summary (Last 24 hours) at 01/14/13 1240 Last data filed at 01/14/13 1200  Gross per 24 hour  Intake 2061.33 ml  Output   3015 ml  Net -953.67 ml    Last BM: 7/4   Labs:   Recent Labs Lab 01/12/13 0400 01/13/13 0500 01/14/13 0430  NA 138 136 142  K 4.1 3.3* 3.9  CL 102 100 105  CO2 32 29 29  BUN 17 16 23   CREATININE 0.86 0.62 0.70  CALCIUM 8.1* 7.9* 8.7  MG  --   --  2.0  PHOS  --   --  2.5  GLUCOSE 122* 140* 124*    CBG (last 3)   Recent Labs  01/14/13 0330 01/14/13 0803 01/14/13 1123  GLUCAP 135* 147* 113*    Scheduled Meds: . antiseptic oral rinse  15 mL Mouth Rinse QID  . cefTAZidime (FORTAZ)  IV  2 g Intravenous Q8H  . chlorhexidine  15 mL Mouth Rinse BID  . enoxaparin (LOVENOX) injection  40 mg Subcutaneous Q24H  . feeding supplement  30 mL Per Tube TID   . feeding supplement (VITAL AF 1.2 CAL)  1,000 mL Per Tube Q24H  . furosemide  40 mg Intravenous Q6H  . ipratropium  0.5 mg Nebulization Q4H  . levalbuterol  0.63 mg Nebulization Q4H  . methylPREDNISolone (SOLU-MEDROL) injection  60 mg Intravenous Q6H  . pantoprazole sodium  40 mg Per Tube Daily  . potassium chloride  40 mEq Per Tube TID  . simvastatin  20 mg Oral QHS  . vancomycin  1,000 mg Intravenous Q12H    Continuous Infusions: . sodium chloride 20 mL/hr at 01/13/13 1109  . fentaNYL infusion INTRAVENOUS 150 mcg/hr (01/14/13 0747)  . norepinephrine (LEVOPHED) Adult infusion      Kendell Bane RD, LDN, CNSC [phone] Pager [phone] After Hours Pager

## 2013-01-14 NOTE — Progress Notes (Addendum)
Dr. Tyson Alias notified of low BP, orders to dc Propofol. Will continue to monitor.  Corliss Skains RN

## 2013-01-14 NOTE — Progress Notes (Signed)
eLink Physician-Brief Progress Note Patient Name: Cynthia [name] DOB: 1951-03-20 MRN: [id number]  Date of Service  01/14/2013   HPI/Events of Note   Low bp  eICU Interventions  Dc prop   Intervention Category Major Interventions: Change in mental status - evaluation and management;Hypotension - evaluation and management  Jori Thrall J. 01/14/2013, 4:36 AM

## 2013-01-14 NOTE — Progress Notes (Signed)
ANTIBIOTIC CONSULT NOTE - FOLLOW UP  Pharmacy Consult for Vancomycin/Ceftazidime Indication: pneumonia  Allergies  Allergen Reactions  . Aspirin     REACTION: Stomach upset    Patient Measurements: Height: 5\' 5"  (165.1 cm) Weight: 130 lb 8.2 oz (59.2 kg) IBW/kg (Calculated) : 57  Vital Signs: Temp: 99.7 F (37.6 C) (07/08 0804) Temp src: Oral (07/08 0804) BP: 108/48 mmHg (07/08 0743) Pulse Rate: 88 (07/08 0743) Intake/Output from previous day: 07/07 0701 - 07/08 0700 In: 2337.4 [I.V.:1084.4; NG/GT:803; IV Piggyback:450] Out: 2855 [Urine:2855] Intake/Output from this shift: Total I/O In: 150 [IV Piggyback:150] Out: -   Labs:  Recent Labs  01/12/13 0400 01/13/13 0500 01/14/13 0430  WBC 13.8* 13.3* 14.3*  HGB 8.4* 8.0* 7.6*  PLT 206 206 231  CREATININE 0.86 0.62 0.70   Estimated Creatinine Clearance: 67.3 ml/min (by C-G formula based on Cr of 0.7).  Recent Labs  01/14/13 0500  VANCOTROUGH 12.0     Microbiology: Recent Results (from the past 720 hour(s))  CULTURE, EXPECTORATED SPUTUM-ASSESSMENT     Status: None   Collection Time    01/08/13  6:55 PM      Result Value Range Status   Specimen Description SPUTUM   Final   Special Requests NONE   Final   Sputum evaluation     Final   Value: THIS SPECIMEN IS ACCEPTABLE. RESPIRATORY CULTURE REPORT TO FOLLOW.   Report Status 01/08/2013 FINAL   Final  CULTURE, RESPIRATORY (NON-EXPECTORATED)     Status: None   Collection Time    01/08/13  6:55 PM      Result Value Range Status   Specimen Description SPUTUM   Final   Special Requests NONE   Final   Gram Stain     Final   Value: RARE WBC PRESENT, PREDOMINANTLY PMN     MODERATE SQUAMOUS EPITHELIAL CELLS PRESENT     RARE GRAM POSITIVE COCCI IN PAIRS   Culture NORMAL OROPHARYNGEAL FLORA   Final   Report Status 01/12/2013 FINAL   Final  CULTURE, BLOOD (ROUTINE X 2)     Status: None   Collection Time    01/11/13  3:27 AM      Result Value Range Status    Specimen Description Blood RIGHT ANTECUBITAL   Final   Special Requests BOTTLES DRAWN AEROBIC AND ANAEROBIC 6CC   Final   Culture NO GROWTH 2 DAYS   Final   Report Status PENDING   Incomplete  CULTURE, BLOOD (ROUTINE X 2)     Status: None   Collection Time    01/11/13  3:52 AM      Result Value Range Status   Specimen Description Blood BLOOD RIGHT HAND   Final   Special Requests BOTTLES DRAWN AEROBIC AND ANAEROBIC 6CC   Final   Culture NO GROWTH 2 DAYS   Final   Report Status PENDING   Incomplete  MRSA PCR SCREENING     Status: None   Collection Time    01/11/13 11:00 AM      Result Value Range Status   MRSA by PCR NEGATIVE  NEGATIVE Final   Comment:            The GeneXpert MRSA Assay (FDA     approved for NASAL specimens     only), is one component of a     comprehensive MRSA colonization     surveillance program. It is not     intended to diagnose MRSA  infection nor to guide or     monitor treatment for     MRSA infections.  PNEUMOCYSTIS JIROVECI SMEAR BY DFA     Status: None   Collection Time    01/11/13  1:15 PM      Result Value Range Status   Specimen Source-PJSRC ENDOTRACHEAL   Final   Comment: ASPIRATE   Pneumocystis jiroveci Ag NEGATIVE   Final   Comment: Performed at [facility]  CULTURE, RESPIRATORY (NON-EXPECTORATED)     Status: None   Collection Time    01/11/13  1:15 PM      Result Value Range Status   Specimen Description BRONCHIAL WASHINGS PER AMEH S.,RN 01/11/13 1524   Final   Special Requests NONE   Final   Gram Stain     Final   Value: ABUNDANT WBC PRESENT,BOTH PMN AND MONONUCLEAR     NO SQUAMOUS EPITHELIAL CELLS SEEN     NO ORGANISMS SEEN   Culture Non-Pathogenic Oropharyngeal-type Flora Isolated.   Final   Report Status 01/14/2013 FINAL   Final    Anti-infectives   Start     Dose/Rate Route Frequency Ordered Stop   01/14/13 2000  vancomycin (VANCOCIN) IVPB 1000 mg/200 mL premix     1,000 mg 200 mL/hr over 60 Minutes  Intravenous Every 12 hours 01/14/13 0911     01/12/13 1600  levofloxacin (LEVAQUIN) IVPB 750 mg  Status:  Discontinued     750 mg 100 mL/hr over 90 Minutes Intravenous Every 24 hours 01/12/13 1516 01/13/13 1041   01/11/13 1400  cefTAZidime (FORTAZ) 2 g in dextrose 5 % 50 mL IVPB     2 g 100 mL/hr over 30 Minutes Intravenous 3 times per day 01/11/13 1338     01/10/13 1700  vancomycin (VANCOCIN) IVPB 750 mg/150 ml premix  Status:  Discontinued     750 mg 150 mL/hr over 60 Minutes Intravenous Every 12 hours 01/10/13 1453 01/14/13 0911   01/10/13 1600  levofloxacin (LEVAQUIN) IVPB 750 mg  Status:  Discontinued     750 mg 100 mL/hr over 90 Minutes Intravenous Every 24 hours 01/10/13 1441 01/12/13 1515   01/10/13 1500  ceFEPIme (MAXIPIME) 1 g in dextrose 5 % 50 mL IVPB  Status:  Discontinued     1 g 100 mL/hr over 30 Minutes Intravenous 3 times per day 01/10/13 1441 01/11/13 1337   01/09/13 0300  azithromycin (ZITHROMAX) 500 mg in dextrose 5 % 250 mL IVPB  Status:  Discontinued     500 mg 250 mL/hr over 60 Minutes Intravenous Every 24 hours 01/08/13 0839 01/10/13 1400   01/09/13 0200  cefTRIAXone (ROCEPHIN) 1 g in dextrose 5 % 50 mL IVPB  Status:  Discontinued     1 g 100 mL/hr over 30 Minutes Intravenous Every 24 hours 01/08/13 0839 01/10/13 1400   01/08/13 0230  cefTRIAXone (ROCEPHIN) 1 g in dextrose 5 % 50 mL IVPB     1 g 100 mL/hr over 30 Minutes Intravenous  Once 01/08/13 0217 01/08/13 0326   01/08/13 0230  azithromycin (ZITHROMAX) tablet 500 mg     500 mg Oral  Once 01/08/13 0217 01/08/13 0304      Assessment: 61 y/o F with PNA who continues on vancomycin and ceftazidime. WBC 14.3<13.3, Tmax 101.7, Scr 0.70 with CrCl ~ 65-70. Appears somewhat refractory to treatment with possible interstitial changes; CCM considering addition of steroids to treatment. Vancomycin trough this AM was 12, so will increase dose.  Goal of Therapy:  Vancomycin trough level 15-20 mcg/ml  Plan:   -Increase vancomycin to 1000mg  IV q12h -Continue ceftazidime 2g IV q8h -Trend WBC, temp, micro data, radiologic studies -F/U addition of steroids -Re-check vancomycin trough as indicated  Thank you for allowing me to take part in this patient's care,  Abran Duke, PharmD Clinical Pharmacist Phone: [phone] Pager: [phone] 01/14/2013 9:17 AM

## 2013-01-14 NOTE — Progress Notes (Signed)
eLink Physician-Brief Progress Note Patient Name: Cynthia [name] DOB: Jan 31, 1951 MRN: [id number]  Date of Service  01/14/2013   HPI/Events of Note   MAP low  eICU Interventions  Dc lasix, bolus   Intervention Category Major Interventions: Hypotension - evaluation and management  [name]. 01/14/2013, 12:36 AM

## 2013-01-15 ENCOUNTER — Inpatient Hospital Stay (HOSPITAL_COMMUNITY): Payer: Medicaid Other

## 2013-01-15 LAB — BASIC METABOLIC PANEL
GFR calc Af Amer: >90 mL/min (ref >90)
GFR calc non Af Amer: 89 mL/min — ABNORMAL LOW (ref >90)
Potassium: 3.8 mEq/L (ref 3.5–5.1)
Sodium: 143 mEq/L (ref 135–145)

## 2013-01-15 LAB — BLOOD GAS, ARTERIAL
Bicarbonate: 32.6 mEq/L — ABNORMAL HIGH (ref 20.0–24.0)
Drawn by: 330991
FIO2: 0.5 %
PEEP: 5 cmH2O
Patient temperature: 98.6
Patient temperature: 98.6
Pressure support: 5 cmH2O
TCO2: 34.1 mmol/L (ref 0–100)
TCO2: 36.1 mmol/L (ref 0–100)
pH, Arterial: 7.464 — ABNORMAL HIGH (ref 7.350–7.450)
pH, Arterial: 7.411 (ref 7.350–7.450)

## 2013-01-15 LAB — PHOSPHORUS: Phosphorus: 2.9 mg/dL (ref 2.3–4.6)

## 2013-01-15 LAB — MAGNESIUM: Magnesium: 2.1 mg/dL (ref 1.5–2.5)

## 2013-01-15 LAB — POTASSIUM: Potassium: 4.3 mEq/L (ref 3.5–5.1)

## 2013-01-15 LAB — CBC
Hemoglobin: 8.2 g/dL — ABNORMAL LOW (ref 12.0–15.0)
MCHC: 34.6 g/dL (ref 30.0–36.0)
RBC: 2.97 MIL/uL — ABNORMAL LOW (ref 3.87–5.11)
WBC: 14.3 10*3/uL — ABNORMAL HIGH (ref 4.0–10.5)

## 2013-01-15 LAB — GLUCOSE, CAPILLARY
Glucose-Capillary: 177 mg/dL — ABNORMAL HIGH (ref 70–99)
Glucose-Capillary: 180 mg/dL — ABNORMAL HIGH (ref 70–99)

## 2013-01-15 MED ORDER — DEXMEDETOMIDINE HCL IN NACL 200 MCG/50ML IV SOLN
0.2000 ug/kg/h | INTRAVENOUS | Status: DC
  Administered 2013-01-15 (×2): 0.4 ug/kg/h via INTRAVENOUS
  Administered 2013-01-16 (×3): 0.9 ug/kg/h via INTRAVENOUS
  Filled 2013-01-15 (×6): qty 50

## 2013-01-15 MED ORDER — POTASSIUM CHLORIDE 20 MEQ/15ML (10%) PO LIQD
40.0000 meq | Freq: Three times a day (TID) | ORAL | Status: AC
  Administered 2013-01-15 (×2): 40 meq
  Filled 2013-01-15: qty 30

## 2013-01-15 MED ORDER — ALBUTEROL SULFATE (5 MG/ML) 0.5% IN NEBU: 2.5000 mg | INHALATION_SOLUTION | RESPIRATORY_TRACT | Status: DC | PRN

## 2013-01-15 MED ORDER — FENTANYL CITRATE 0.05 MG/ML IJ SOLN
25.0000 ug | INTRAMUSCULAR | Status: DC | PRN
  Administered 2013-01-16: 50 ug via INTRAVENOUS
  Filled 2013-01-15: qty 2

## 2013-01-15 MED ORDER — LEVALBUTEROL HCL 0.63 MG/3ML IN NEBU
0.6300 mg | INHALATION_SOLUTION | Freq: Four times a day (QID) | RESPIRATORY_TRACT | Status: DC
  Administered 2013-01-15 – 2013-01-19 (×15): 0.63 mg via RESPIRATORY_TRACT
  Filled 2013-01-15 (×23): qty 3

## 2013-01-15 MED ORDER — FUROSEMIDE 10 MG/ML IJ SOLN
INTRAMUSCULAR | Status: AC
  Filled 2013-01-15: qty 4

## 2013-01-15 MED ORDER — MIDAZOLAM HCL 2 MG/2ML IJ SOLN
2.0000 mg | INTRAMUSCULAR | Status: DC | PRN
  Administered 2013-01-15: 2 mg via INTRAVENOUS
  Filled 2013-01-15: qty 2

## 2013-01-15 MED ORDER — INSULIN ASPART 100 UNIT/ML ~~LOC~~ SOLN
1.0000 [IU] | SUBCUTANEOUS | Status: DC
  Administered 2013-01-15 – 2013-01-16 (×6): 2 [IU] via SUBCUTANEOUS

## 2013-01-15 MED ORDER — WHITE PETROLATUM GEL
Status: AC
  Administered 2013-01-15: 17:00:00
  Filled 2013-01-15: qty 5

## 2013-01-15 MED ORDER — IPRATROPIUM BROMIDE 0.02 % IN SOLN
0.5000 mg | Freq: Four times a day (QID) | RESPIRATORY_TRACT | Status: DC
  Administered 2013-01-15 – 2013-01-19 (×15): 0.5 mg via RESPIRATORY_TRACT
  Filled 2013-01-15 (×17): qty 2.5

## 2013-01-15 MED ORDER — ACETAZOLAMIDE SODIUM 500 MG IJ SOLR
250.0000 mg | Freq: Three times a day (TID) | INTRAMUSCULAR | Status: AC
  Administered 2013-01-15 (×2): 250 mg via INTRAVENOUS
  Filled 2013-01-15 (×2): qty 500

## 2013-01-15 MED ORDER — FUROSEMIDE 10 MG/ML IJ SOLN
40.0000 mg | Freq: Four times a day (QID) | INTRAMUSCULAR | Status: AC
  Administered 2013-01-15 (×3): 40 mg via INTRAVENOUS
  Filled 2013-01-15 (×2): qty 4

## 2013-01-15 MED ORDER — POTASSIUM CHLORIDE 20 MEQ/15ML (10%) PO LIQD
ORAL | Status: AC
  Filled 2013-01-15: qty 30

## 2013-01-15 NOTE — Progress Notes (Signed)
PULMONARY  / CRITICAL CARE MEDICINE  Name: Cynthia [name] MRN: [id number] DOB: 1951/05/11    ADMISSION DATE:  01/08/2013 CONSULTATION DATE:  01/11/13  REFERRING MD:  Dr. Elvera Lennox, Internal medicine [facility]) PRIMARY SERVICE: CCM  CHIEF COMPLAINT:  Cough, SOB  BRIEF PATIENT DESCRIPTION:  62 year old female with chronic bronchitis, HTN, HLD who presented from [facility] with worsening PNA and acute respiratory failure.  SIGNIFICANT EVENTS / STUDIES:  7/2 - S. Pneumo and Legionella Urine Ag - Neg 7/4 Chest Xray - bilateral PNA 7/5 - Acute respiratory failure, Intubated and central line placed 7/5 Bronch - Sputum for culture; no pus noted; secretions minimal 7/5 CT chest - Airspace opacities and secondary pulmonary lobular septal thickening; ? ARDS vs Cardiogenic pulm edema vs Interstitial PNA 7/6 - Febrile overnight 7/7 - HIV antibody - negative; RF elevated at 39.  7/8 - Legionella DFA negative; Echo - EF 55-60%, Grade 1 Diastolic dysfunction; PCP Smear - negative; C3 171 (norm); C4 33 (norm); c-ANCA, p-ANCA, atypical p-ANCA -  Negative; ANA - Negative.  7/8 - Started IV Solumedrol and Diuresis  LINES / TUBES: ETT 7/5 >>>  R IJ 7/5>>  CULTURES: Sputum (Bronch) 7/5>> Normal flora BCx 7/5 >> No growth x 3 days  ANTIBIOTICS: Vanc 7/4>> Ceftriaxone 7/2 >> 7/4 Azithromycin 7/2 >>7/4 Cefepime 7/4 >>7/5 Levaquin 7/4 >>7/7 Ceftaz 7/5 >>  SUBJECTIVE:   Sedated on vent; Following commands  VITAL SIGNS: Temp:  [98.4 F (36.9 C)-99.7 F (37.6 C)] 99.3 F (37.4 C) (07/09 0355) Pulse Rate:  [63-106] 63 (07/09 0400) Resp:  [16-34] 34 (07/09 0400) BP: (97-143)/(45-92) 143/92 mmHg (07/09 0400) SpO2:  [87 %-100 %] 98 % (07/09 0400) Arterial Line BP: (88-167)/(42-82) 167/82 mmHg (07/09 0400) FiO2 (%):  [40 %-50 %] 50 % (07/09 0452) Weight:  [126 lb 15.8 oz (57.6 kg)] 126 lb 15.8 oz (57.6 kg) (07/09 0500)  HEMODYNAMICS:   VENTILATOR SETTINGS: Vent Mode:  [-] PRVC FiO2 (%):   [40 %-50 %] 50 % Set Rate:  [16 bmp] 16 bmp Vt Set:  [460 mL] 460 mL PEEP:  [8 cmH20-10 cmH20] 10 cmH20 Plateau Pressure:  [27 cmH20-31 cmH20] 29 cmH20  INTAKE / OUTPUT: Intake/Output     07/08 0701 - 07/09 0700   I.V. (mL/kg) 749.3 (13)   NG/GT 811   IV Piggyback 450   Total Intake(mL/kg) 2010.3 (34.9)   Urine (mL/kg/hr) 4025 (2.9)   Total Output 4025   Net -2014.7        PHYSICAL EXAMINATION: General:  Sedated on vent, NAD. Neuro: Sedated on Vent, follows commands, exam nonfocal HEENT: ETT tube, OGT in place Cardiovascular: RRR. No m/r/g appreciated. Lungs: Coarse breath sounds throughout but improved from day prior. Abdomen: soft, nondistended.  No organomegaly noted. +BS Extremities: No LE edema appreciated. Skin:  Intact.  LABS:  Recent Labs Lab 01/09/13 0528 01/10/13 0605  01/11/13 1600  01/12/13 0500  01/13/13 0500 01/13/13 1213 01/14/13 0230 01/14/13 0430 01/15/13 0425 01/15/13 0601  HGB 9.3* 9.6*  --  9.0*  < >  --   --  8.0*  --   --  7.6* 8.2*  --   WBC 17.8* 18.8*  --  16.5*  < >  --   --  13.3*  --   --  14.3* 14.3*  --   PLT 201 225  --  206  < >  --   --  206  --   --  231 300  --  NA 139 138  --  138  < >  --   --  136  --   --  142 143  --   K 3.5 3.7  --  3.2*  < >  --   --  3.3*  --   --  3.9 3.8  --   CL 106 103  --  96  < >  --   --  100  --   --  105 101  --   CO2 26 27  --  31  < >  --   --  29  --   --  29 32  --   GLUCOSE 119* 155*  --  161*  < >  --   --  140*  --   --  124* 190*  --   BUN 9 6  --  9  < >  --   --  16  --   --  23 38*  --   CREATININE 0.70 0.66  --  0.74  < >  --   --  0.62  --   --  0.70 0.78  --   CALCIUM 8.3* 9.1  --  8.5  < >  --   --  7.9*  --   --  8.7 9.7  --   MG  --   --   --   --   --   --   --   --   --   --  2.0 2.1  --   PHOS  --   --   --   --   --   --   --   --   --   --  2.5 2.9  --   AST 31  --   --  41*  --  33  --   --   --   --   --   --   --   ALT 26  --   --  29  --  24  --   --   --   --   --    --   --   ALKPHOS 47  --   --  60  --  51  --   --   --   --   --   --   --   BILITOT 0.2*  --   --  0.3  --  0.2*  --   --   --   --   --   --   --   PROT 6.6  --   --  6.8  --  6.1  --   --   --   --   --   --   --   ALBUMIN 3.3*  --   --  2.7*  --  2.1*  --   --   --   --   --   --   --   INR  --   --   --  1.27  --   --   --   --   --   --   --   --   --   PHART  --   --   < >  --   --   --   < >  --  7.317* 7.360  --   --  7.464*  PCO2ART  --   --   < >  --   --   --   < >  --  57.8* 55.9*  --   --  46.0*  PO2ART  --   --   < >  --   --   --   < >  --  157.0* 90.0  --   --  157.0*  < > = values in this interval not displayed.  Recent Labs Lab 01/14/13 1123 01/14/13 1535 01/14/13 2008 01/15/13 0011 01/15/13 0357  GLUCAP 113* 142* 173* 180* 180*   CXR: ETT 1.7 cm above carina (will retract 1 cm); slight improvement in aeration and bilateral infiltrates  ASSESSMENT / PLAN:   PULMONARY A: Acute respiratory failure, r/o HCAP, r/o pulm edema component, r/o non infectious pneumonitis P:   - Cont Abx - Ceftaz and Vanc for HCAP. Last fever 7/7 @ 1500 - Albuterol/Atrovent Q4. - Cultures negative. Echo revealed preserved EF with Grade 1 Diastolic dysfunction. - Patient appears slightly improved with IV Solumedrol and Diuresis - Will continue Solumedrol; Additional Diuresis today. Etiology still unclear. - RT to wean FIO2/PEEP  CARDIOVASCULAR A: Hemodynamically stable, No cardiac history, r/o valvular dysfunction P:  - DVT PPx - Lovenox. - Echo revealed preserved EF with Grade 1 Diastolic dysfunction. - Diuresed well overnight (Neg 2 L) - Will continue diuresis today.  RENAL A:  No Hx of renal disease, Creatinine normal; Hypokalemia P:   - Creatinine WNL. - Will continue to monitor via daily BMP.  GASTROINTESTINAL A:  SUP PPx P:   - Protonix for SUP. - Continue TF.  HEMATOLOGIC A:  DVT PPx, Leukocytosis secondary to HCAP P:  - Lovenox for DVT PPx- follow crt  closely.  INFECTIOUS A:  Bilateral infiltrates >>HCAP (given hospitalization since end june), ?auto-immune pneumonitis. P:   - Continuing abx - Vanc/Ceftaz.  - Cultures continue to be negative.   - Daily chest xray.  ENDOCRINE A: No issues currently P:   - CBG Q6; CBG's stable  NEUROLOGIC A:  Acute encephalopathy secondary to Acute respiratory failure P:   - Sedation - starting Precedex today - WUA daily - Goal RASS - 0  TODAY'S SUMMARY: Continue steroids and additional diuresis, wean FIO2 and PEEP today, Attempt SBT this afternoon if able  Everlene Other DO Family Medicine PGY-2  01/15/2013, 6:54 AM  Will continue diuresis and steroids, abx as ordered.  Begin PS today if able to get to 40 and 5.  CC time 35 min.  Patient seen and examined, agree with above note.  I dictated the care and orders written for this patient under my direction.  Alyson Reedy, MD [phone]

## 2013-01-16 ENCOUNTER — Inpatient Hospital Stay (HOSPITAL_COMMUNITY): Payer: Medicaid Other

## 2013-01-16 LAB — BASIC METABOLIC PANEL
BUN: 63 mg/dL — ABNORMAL HIGH (ref 6–23)
Chloride: 103 mEq/L (ref 96–112)
Creatinine, Ser: 0.97 mg/dL (ref 0.50–1.10)
GFR calc Af Amer: 72 mL/min — ABNORMAL LOW (ref >90)

## 2013-01-16 LAB — CULTURE, BLOOD (ROUTINE X 2): Culture: NO GROWTH

## 2013-01-16 LAB — BLOOD GAS, ARTERIAL
Drawn by: 235321
MECHVT: 0.46 mL
RATE: 16 resp/min
TCO2: 31.5 mmol/L (ref 0–100)
pCO2 arterial: 48.5 mmHg — ABNORMAL HIGH (ref 35.0–45.0)
pH, Arterial: 7.409 (ref 7.350–7.450)

## 2013-01-16 LAB — PHOSPHORUS: Phosphorus: 3.8 mg/dL (ref 2.3–4.6)

## 2013-01-16 LAB — CLOSTRIDIUM DIFFICILE BY PCR: Toxigenic C. Difficile by PCR: NEGATIVE

## 2013-01-16 LAB — GLUCOSE, CAPILLARY
Glucose-Capillary: 137 mg/dL — ABNORMAL HIGH (ref 70–99)
Glucose-Capillary: 187 mg/dL — ABNORMAL HIGH (ref 70–99)
Glucose-Capillary: 188 mg/dL — ABNORMAL HIGH (ref 70–99)

## 2013-01-16 LAB — CBC
HCT: 24.9 % — ABNORMAL LOW (ref 36.0–46.0)
MCHC: 34.9 g/dL (ref 30.0–36.0)
RDW: 13.5 % (ref 11.5–15.5)

## 2013-01-16 MED ORDER — FUROSEMIDE 10 MG/ML IJ SOLN: 40.0000 mg | Freq: Two times a day (BID) | INTRAMUSCULAR | Status: DC

## 2013-01-16 MED ORDER — INSULIN ASPART 100 UNIT/ML ~~LOC~~ SOLN
0.0000 [IU] | SUBCUTANEOUS | Status: DC
  Administered 2013-01-16: 3 [IU] via SUBCUTANEOUS
  Administered 2013-01-16 (×2): 2 [IU] via SUBCUTANEOUS
  Administered 2013-01-16: 3 [IU] via SUBCUTANEOUS
  Administered 2013-01-16 – 2013-01-18 (×4): 2 [IU] via SUBCUTANEOUS
  Administered 2013-01-18: 3 [IU] via SUBCUTANEOUS
  Administered 2013-01-18 – 2013-01-19 (×4): 2 [IU] via SUBCUTANEOUS

## 2013-01-16 MED ORDER — FREE WATER
200.0000 mL | Freq: Three times a day (TID) | Status: DC
  Administered 2013-01-16: 200 mL

## 2013-01-16 MED ORDER — METHYLPREDNISOLONE SODIUM SUCC 40 MG IJ SOLR
40.0000 mg | Freq: Four times a day (QID) | INTRAMUSCULAR | Status: DC
  Administered 2013-01-16 – 2013-01-17 (×4): 40 mg via INTRAVENOUS
  Filled 2013-01-16 (×11): qty 1

## 2013-01-16 MED ORDER — BUTALBITAL-APAP-CAFF-COD 50-325-40-30 MG PO CAPS: 1.0000 | ORAL_CAPSULE | Freq: Four times a day (QID) | ORAL | Status: DC | PRN

## 2013-01-16 MED ORDER — CODEINE SULFATE 30 MG PO TABS
30.0000 mg | ORAL_TABLET | Freq: Four times a day (QID) | ORAL | Status: DC | PRN
  Administered 2013-01-17 – 2013-01-18 (×3): 30 mg via ORAL
  Filled 2013-01-16: qty 2
  Filled 2013-01-16: qty 4
  Filled 2013-01-16 (×2): qty 2

## 2013-01-16 MED ORDER — FUROSEMIDE 10 MG/ML IJ SOLN
40.0000 mg | Freq: Two times a day (BID) | INTRAMUSCULAR | Status: AC
  Administered 2013-01-16 (×2): 40 mg via INTRAVENOUS
  Filled 2013-01-16 (×2): qty 4

## 2013-01-16 MED ORDER — HYDRALAZINE HCL 20 MG/ML IJ SOLN
10.0000 mg | INTRAMUSCULAR | Status: DC | PRN
  Administered 2013-01-16: 10 mg via INTRAVENOUS

## 2013-01-16 MED ORDER — INSULIN ASPART 100 UNIT/ML ~~LOC~~ SOLN: 0.0000 [IU] | SUBCUTANEOUS | Status: DC

## 2013-01-16 MED ORDER — HYDRALAZINE HCL 20 MG/ML IJ SOLN
INTRAMUSCULAR | Status: AC
  Filled 2013-01-16: qty 1

## 2013-01-16 MED ORDER — BUTALBITAL-APAP-CAFFEINE 50-325-40 MG PO TABS
1.0000 | ORAL_TABLET | Freq: Four times a day (QID) | ORAL | Status: DC | PRN
  Administered 2013-01-16 – 2013-01-17 (×4): 1 via ORAL
  Filled 2013-01-16 (×5): qty 1

## 2013-01-16 MED ORDER — POTASSIUM CHLORIDE CRYS ER 20 MEQ PO TBCR
40.0000 meq | EXTENDED_RELEASE_TABLET | Freq: Three times a day (TID) | ORAL | Status: AC
  Administered 2013-01-16 (×2): 40 meq via ORAL
  Filled 2013-01-16: qty 1
  Filled 2013-01-16: qty 2
  Filled 2013-01-16: qty 1

## 2013-01-16 NOTE — Procedures (Signed)
Extubation Procedure Note  Patient Details:   Name: Cynthia [name] DOB: Jul 13, 1950 MRN: [id number]   Airway Documentation:     Evaluation  O2 sats: stable throughout Complications: No apparent complications Patient did tolerate procedure well. Bilateral Breath Sounds: Clear;Diminished Suctioning: Airway Yes  Ave Filter 01/16/2013, 9:26 AM

## 2013-01-16 NOTE — Progress Notes (Signed)
UR Completed.  Kynzi Levay Jane [phone] 01/16/2013  

## 2013-01-16 NOTE — Progress Notes (Signed)
PULMONARY  / CRITICAL CARE MEDICINE  Name: Cynthia [name] MRN: [id number] DOB: 12-29-1950    ADMISSION DATE:  01/08/2013 CONSULTATION DATE:  01/11/13  REFERRING MD:  Dr. Elvera Lennox, Internal medicine [facility]) PRIMARY SERVICE: CCM  CHIEF COMPLAINT:  Cough, SOB  BRIEF PATIENT DESCRIPTION:  62 year old female with chronic bronchitis, HTN, HLD who presented from [facility] with worsening PNA and acute respiratory failure.  SIGNIFICANT EVENTS / STUDIES:  7/2 - S. Pneumo and Legionella Urine Ag - Neg 7/4 Chest Xray - bilateral PNA 7/5 - Acute respiratory failure, Intubated and central line placed 7/5 Bronch - Sputum for culture; no pus noted; secretions minimal 7/5 CT chest - Airspace opacities and secondary pulmonary lobular septal thickening; ? ARDS vs Cardiogenic pulm edema vs Interstitial PNA 7/6 - Febrile overnight 7/7 - HIV antibody - negative; RF elevated at 39.  7/8 - Legionella DFA negative; Echo - EF 55-60%, Grade 1 Diastolic dysfunction; PCP Smear - negative; C3 171 (norm); C4 33 (norm); c-ANCA, p-ANCA, atypical p-ANCA -  Negative; ANA - Negative.  7/8 - Started IV Solumedrol and Diuresis  LINES / TUBES: ETT 7/5 >>>  R IJ 7/5>>  CULTURES: Sputum (Bronch) 7/5>> Normal flora BCx 7/5 >> No growth to date  ANTIBIOTICS: Vanc 7/4>> Ceftriaxone 7/2 >> 7/4 Azithromycin 7/2 >>7/4 Cefepime 7/4 >>7/5 Levaquin 7/4 >>7/7 Ceftaz 7/5 >>  SUBJECTIVE:    VITAL SIGNS: Temp:  [97.4 F (36.3 C)-98.5 F (36.9 C)] 97.4 F (36.3 C) (07/10 0400) Pulse Rate:  [64-94] 70 (07/10 0600) Resp:  [15-30] 17 (07/10 0400) BP: (115-158)/(57-79) 158/79 mmHg (07/10 0600) SpO2:  [98 %-100 %] 99 % (07/10 0600) Arterial Line BP: (116-167)/(54-79) 165/74 mmHg (07/10 0600) FiO2 (%):  [40 %-50 %] 40 % (07/10 0600) Weight:  [123 lb 3.8 oz (55.9 kg)] 123 lb 3.8 oz (55.9 kg) (07/10 0500)  HEMODYNAMICS: CVP:  [8 mmHg-9 mmHg] 8 mmHg VENTILATOR SETTINGS: Vent Mode:  [-] PRVC FiO2 (%):  [40 %-50 %]  40 % Set Rate:  [16 bmp] 16 bmp Vt Set:  [460 mL] 460 mL PEEP:  [5 cmH20-8 cmH20] 5 cmH20 Pressure Support:  [5 cmH20-8 cmH20] 5 cmH20 Plateau Pressure:  [8 cmH20-25 cmH20] 8 cmH20  INTAKE / OUTPUT: Intake/Output     07/09 0701 - 07/10 0700 07/10 0701 - 07/11 0700   I.V. (mL/kg) 675.8 (12.1)    Other 40    NG/GT 1260    IV Piggyback 550    Total Intake(mL/kg) 2525.8 (45.2)    Urine (mL/kg/hr) 2680 (2)    Stool 1100 (0.8)    Total Output 3780     Net -1254.2           PHYSICAL EXAMINATION: General:  Sedated on vent, NAD. Neuro: Sedated on Vent, follows commands, exam nonfocal HEENT: ETT tube, OGT in place Cardiovascular: RRR. No m/r/g appreciated. Lungs: CTAB. No rales, rhonchi, or wheezing noted. Abdomen: soft, nondistended.  No organomegaly noted. +BS Extremities: No LE edema appreciated. Skin:  Intact.  LABS:  Recent Labs Lab 01/10/13 0605  01/11/13 1600  01/12/13 0500  01/14/13 0430 01/15/13 0425 01/15/13 0601 01/15/13 1539 01/15/13 1818 01/16/13 0310 01/16/13 0428  HGB 9.6*  --  9.0*  < >  --   < > 7.6* 8.2*  --   --   --  8.7*  --   WBC 18.8*  --  16.5*  < >  --   < > 14.3* 14.3*  --   --   --  18.9*  --   PLT 225  --  206  < >  --   < > 231 300  --   --   --  373  --   NA 138  --  138  < >  --   < > 142 143  --   --   --  147*  --   K 3.7  --  3.2*  < >  --   < > 3.9 3.8  --   --  4.3 3.7  --   CL 103  --  96  < >  --   < > 105 101  --   --   --  103  --   CO2 27  --  31  < >  --   < > 29 32  --   --   --  32  --   GLUCOSE 155*  --  161*  < >  --   < > 124* 190*  --   --   --  209*  --   BUN 6  --  9  < >  --   < > 23 38*  --   --   --  63*  --   CREATININE 0.66  --  0.74  < >  --   < > 0.70 0.78  --   --   --  0.97  --   CALCIUM 9.1  --  8.5  < >  --   < > 8.7 9.7  --   --   --  9.7  --   MG  --   --   --   --   --   --  2.0 2.1  --   --   --  2.5  --   PHOS  --   --   --   --   --   --  2.5 2.9  --   --   --  3.8  --   AST  --   --  41*  --  33  --    --   --   --   --   --   --   --   ALT  --   --  29  --  24  --   --   --   --   --   --   --   --   ALKPHOS  --   --  60  --  51  --   --   --   --   --   --   --   --   BILITOT  --   --  0.3  --  0.2*  --   --   --   --   --   --   --   --   PROT  --   --  6.8  --  6.1  --   --   --   --   --   --   --   --   ALBUMIN  --   --  2.7*  --  2.1*  --   --   --   --   --   --   --   --   INR  --   --  1.27  --   --   --   --   --   --   --   --   --   --  PHART  --   < >  --   --   --   < >  --   --  7.464* 7.411  --   --  7.409  PCO2ART  --   < >  --   --   --   < >  --   --  46.0* 55.2*  --   --  48.5*  PO2ART  --   < >  --   --   --   < >  --   --  157.0* 120.0*  --   --  144.0*  < > = values in this interval not displayed.  Recent Labs Lab 01/15/13 1210 01/15/13 1543 01/15/13 1951 01/16/13 0039 01/16/13 0304  GLUCAP 172* 163* 160* 187* 188*   CXR: Improved bilateral infiltrates.   ASSESSMENT / PLAN:   PULMONARY A: Acute respiratory failure, r/o HCAP, r/o pulm edema component, r/o non infectious pneumonitis P:   - Cont Abx - Ceftaz and Vanc for HCAP. Patient afebrile since 7/7.  - Cultures negative. Echo revealed preserved EF with Grade 1 Diastolic dysfunction. - Patient improved with steroids and diuresis. - Will decrease IV solumedrol to 40 mg Q6; Continue Albuterol/Atrovent. - Weaning today, CPAP 5/5, Planned extubation.  CARDIOVASCULAR A: Hemodynamically stable, No cardiac history, r/o valvular dysfunction, HTN P:  - DVT PPx - Lovenox. - Echo revealed preserved EF with Grade 1 Diastolic dysfunction. - Diuresed well overnight (~1200 mL), lasix 40 mg IV BID x2 doses. - BP rising; Adding PRN Hydralazine.  RENAL A:  No Hx of renal disease, Creatinine normal; Hypernatremia P:   - Creatinine WNL. - Will continue to monitor via daily BMP. - Adding Free Water flushes today.  GASTROINTESTINAL A:  SUP PPx P:   - Protonix for SUP. - Continue TF.  HEMATOLOGIC A:  DVT  PPx, Leukocytosis secondary to HCAP P:  - Lovenox for DVT PPx- follow crt closely.  INFECTIOUS A:  Bilateral infiltrates >>HCAP (given hospitalization since end june), ?auto-immune pneumonitis,  P:   - Continuing abx - Vanc/Ceftaz as above. Will plan to discontinue after 7/12 (8 day course since placement of ETT tube) - Cultures - Negative. - Daily chest xray. - Patient improving with Diuresis and Steroids, will decrease diureses.  ENDOCRINE A: Hyperglycemia P:   - CBG's Q4, SSI increased.  NEUROLOGIC A:  Acute encephalopathy secondary to Acute respiratory failure P:   - D/C sedation. - Pain control as needed.  TODAY'S SUMMARY:  Extubation today, Continue Abx, tapering steroids.   Everlene Other DO Family Medicine PGY-2   CC time 35 min.  Patient seen and examined, agree with above note.  I dictated the care and orders written for this patient under my direction.  Alyson Reedy, MD [phone]

## 2013-01-16 NOTE — Evaluation (Signed)
Physical Therapy Evaluation Patient Details Name: Cynthia [name] MRN: [id number] DOB: 01-26-1951 Today's Date: 01/16/2013 Time: 1352-1430 PT Time Calculation (min): 38 min  PT Assessment / Plan / Recommendation History of Present Illness  Pt with acute respiratory failure from [facility] with VDRF 7/5-7/10, chronic bronchitis  Clinical Impression  Pt very pleasant and eager to mobilize with good family support. Pt demonstrates generalized weakness from prolonged bedrest, decreased mobility and gait with sats that dropped to 88% on 4L with stand pivot with return to 96% in grossly 30 sec with cues for deep breathing. Pt will benefit from acute therapy to maximize mobility, function and independence prior to discharge. Increased time for eval due to pt with leaking rectal pouch and total care for pericare and linen change. Recommend daily mobility with nursing.    PT Assessment  Patient needs continued PT services    Follow Up Recommendations  Home health PT    Does the patient have the potential to tolerate intense rehabilitation      Barriers to Discharge        Equipment Recommendations  None recommended by PT    Recommendations for Other Services OT consult   Frequency Min 3X/week    Precautions / Restrictions Precautions Precautions: Fall   Pertinent Vitals/Pain HA 4/10 RN aware sats 96% on 4L      Mobility  Bed Mobility Bed Mobility: Rolling Left;Rolling Right Rolling Right: 4: Min assist;With rail Rolling Left: 4: Min assist;With rail Details for Bed Mobility Assistance: cueing for sequence Transfers Transfers: Sit to Stand;Stand to Sit;Stand Pivot Transfers Sit to Stand: 3: Mod assist;From bed Stand to Sit: 4: Min assist;To chair/3-in-1;With armrests Stand Pivot Transfers: 4: Min assist Details for Transfer Assistance: cueing for hand placement with assist for anterior translation and pt hands on therapist elbows to pivot 3' to chair with short shuffle steps and  cues for head and trunk erect Ambulation/Gait Ambulation/Gait Assistance: Not tested (comment) Stairs: No    Exercises General Exercises - Lower Extremity Long Arc Quad: AROM;5 reps;Both;Seated Hip Flexion/Marching: AROM;5 reps;Both;Seated   PT Diagnosis: Difficulty walking;Generalized weakness  PT Problem List: Decreased activity tolerance;Decreased strength;Decreased mobility;Decreased knowledge of use of DME;Cardiopulmonary status limiting activity PT Treatment Interventions: Gait training;DME instruction;Stair training;Therapeutic exercise;Therapeutic activities;Functional mobility training;Patient/family education     PT Goals(Current goals can be found in the care plan section) Acute Rehab PT Goals Patient Stated Goal: be able to go home PT Goal Formulation: With patient/family Time For Goal Achievement: 01/30/13 Potential to Achieve Goals: Good  Visit Information  Last PT Received On: 01/16/13 Assistance Needed: +1 History of Present Illness: Pt with acute respiratory failure from [facility] with VDRF 7/5-7/10, chronic bronchitis       Prior Functioning  Home Living Family/patient expects to be discharged to:: Private residence Living Arrangements: Spouse/significant other Available Help at Discharge: Family;Available 24 hours/day Type of Home: Mobile home Home Access: Stairs to enter Entrance Stairs-Number of Steps: 3 Home Layout: One level Home Equipment: Walker - 2 wheels;Bedside commode;Cane - single point;Shower seat;Wheelchair - manual Additional Comments: various equipment from spouse and family Prior Function Level of Independence: Independent Communication Communication: No difficulties    Cognition  Cognition Arousal/Alertness: Awake/alert Behavior During Therapy: WFL for tasks assessed/performed Overall Cognitive Status: Within Functional Limits for tasks assessed    Extremity/Trunk Assessment Upper Extremity Assessment Upper Extremity Assessment: Defer  to OT evaluation Lower Extremity Assessment Lower Extremity Assessment: Generalized weakness Cervical / Trunk Assessment Cervical / Trunk Assessment: Normal   Balance  End of Session PT - End of Session Activity Tolerance: Patient limited by fatigue Patient left: in chair;with call bell/phone within reach;with nursing/sitter in room;with family/visitor present Nurse Communication: Mobility status  GP     Delorse Lek 01/16/2013, 2:36 PM Delaney Meigs, PT [phone]

## 2013-01-16 NOTE — Evaluation (Signed)
Clinical/Bedside Swallow Evaluation Patient Details  Name: Cynthia [name] MRN: [id number] Date of Birth: 1950/10/10  Today's Date: 01/16/2013 Time: 4098-1191 SLP Time Calculation (min): 25 min  Past Medical History:  Past Medical History  Diagnosis Date  . Migraine   . Hypertension   . Bronchitis   . High blood cholesterol level    Past Surgical History:  Past Surgical History  Procedure Laterality Date  . Left arm     HPI:  62 year old female with chronic bronchitis, HTN, HLD who presented from [facility] with worsening PNA and acute respiratory failure. Patient extubated earlier today and orders recieved for BSE.    Assessment / Plan / Recommendation Clinical Impression  Patient demonstrates functional oral motor abilities, timely initiation of swallows and strong cough which suggests ability to protect airway.  Mastication abilities were reduce due to not having upper dentures available.  As a result, it is recommended that this patient initiate a Dys.3 soft solid diet with thin liquids and intermittent staff supervision to ensure small portions at a slow rate.  Significant other and RN present at educated regarding recommendations to reduce aspiration risk.  Recommend SLP follow 1-2 times to ensure toleration.      Aspiration Risk  Mild    Diet Recommendation Dysphagia 3 (Mechanical Soft);Thin liquid   Liquid Administration via: Cup;Straw Medication Administration: Whole meds with puree Supervision: Patient able to self feed;Intermittent supervision to cue for compensatory strategies Compensations: Slow rate;Small sips/bites Postural Changes and/or Swallow Maneuvers: Seated upright 90 degrees;Out of bed for meals    Other  Recommendations Oral Care Recommendations: Oral care BID   Follow Up Recommendations  None    Frequency and Duration min 2x/week  1 week   Pertinent Vitals/Pain none    SLP Swallow Goals Patient will consume recommended diet without observed  clinical signs of aspiration with: Modified independent assistance Goal #3: Patient will demonstrate effecient mastication of regular textures Mod I    Swallow Study Prior Functional Status  Type of Home: Mobile home Available Help at Discharge: Family;Available 24 hours/day    General HPI: 63 year old female with chronic bronchitis, HTN, HLD who presented from [facility] with worsening PNA and acute respiratory failure. Patient extubated earlier today and orders recieved for BSE.  Previous Swallow Assessment: none Diet Prior to this Study: NPO Temperature Spikes Noted: No Respiratory Status: Supplemental O2 delivered via (comment) History of Recent Intubation: Yes Date extubated: 01/16/13 Behavior/Cognition: Alert;Cooperative;Pleasant mood Oral Cavity - Dentition: Dentures, not available (uppers) Self-Feeding Abilities: Able to feed self Patient Positioning: Upright in chair Baseline Vocal Quality: Low vocal intensity Volitional Cough: Strong Volitional Swallow: Able to elicit    Oral/Motor/Sensory Function Overall Oral Motor/Sensory Function: Appears within functional limits for tasks assessed   Ice Chips Ice chips: Within functional limits Presentation: Self Fed;Spoon   Thin Liquid Thin Liquid: Within functional limits Presentation: Cup;Straw;Self Fed Other Comments: intermittent dry cough suspect not related to p.o.    Nectar Thick Nectar Thick Liquid: Not tested   Honey Thick Honey Thick Liquid: Not tested   Puree Puree: Within functional limits Presentation: Self Fed;Spoon   Solid   GO    Solid: Impaired Presentation: Self Fed Other Comments: reduced mastication suspected to be due to missing upper dentures      Cynthia [name], M.A., CCC-SLP [phone]  Geisha Abernathy 01/16/2013,5:00 PM

## 2013-01-16 NOTE — Progress Notes (Addendum)
NUTRITION FOLLOW UP  Intervention:   If pt must remain NPO, recommend initiate Jevity 1.2 @ 25 ml/hr and increase by 10 ml every 4 hours to goal rate of 55 ml/hr.   TF regimen will provide 1584 kcal (>100% of needs), 73 grams protein (>100% of needs), and 1069 ml H2O.   Nutrition Dx:   Inadequate oral intake related to inability to eat as evidenced by NPO status; ongoing.   Goal:  Pt to meet >/= 90% of their estimated nutrition needs; met.   Monitor:  Vent status, TF tolerance, weight trend, labs   Assessment:   Pt admitted with chronic bronchitis and worsening PNA. Pt started on IV Solumedrol for possible autoimmune pathology.  Patient extubated 7/10. Swallow evaluation pending.   Height: Ht Readings from Last 1 Encounters:  01/11/13 5\' 5"  (1.651 m)    Weight Status:   Wt Readings from Last 1 Encounters:  01/16/13 123 lb 3.8 oz (55.9 kg)  Admission weight 125 lb 7/2  Re-estimated needs:  Kcal: 1500-1700 Protein: 70-85 grams  Fluid: > 1.5 L/day  Skin: no issues noted  Diet Order: NPO   Intake/Output Summary (Last 24 hours) at 01/16/13 1544 Last data filed at 01/16/13 1500  Gross per 24 hour  Intake 2132.17 ml  Output   4060 ml  Net -1927.83 ml    Last BM: 7/4   Labs:   Recent Labs Lab 01/14/13 0430 01/15/13 0425 01/15/13 1818 01/16/13 0310  NA 142 143  --  147*  K 3.9 3.8 4.3 3.7  CL 105 101  --  103  CO2 29 32  --  32  BUN 23 38*  --  63*  CREATININE 0.70 0.78  --  0.97  CALCIUM 8.7 9.7  --  9.7  MG 2.0 2.1  --  2.5  PHOS 2.5 2.9  --  3.8  GLUCOSE 124* 190*  --  209*    CBG (last 3)   Recent Labs  01/15/13 1951 01/16/13 0039 01/16/13 0304  GLUCAP 160* 187* 188*    Scheduled Meds: . antiseptic oral rinse  15 mL Mouth Rinse QID  . cefTAZidime (FORTAZ)  IV  2 g Intravenous Q8H  . chlorhexidine  15 mL Mouth Rinse BID  . enoxaparin (LOVENOX) injection  40 mg Subcutaneous Q24H  . feeding supplement (VITAL AF 1.2 CAL)  1,000 mL Per Tube  Q24H  . furosemide  40 mg Intravenous BID  . insulin aspart  0-15 Units Subcutaneous Q4H  . ipratropium  0.5 mg Nebulization Q6H  . levalbuterol  0.63 mg Nebulization Q6H  . methylPREDNISolone (SOLU-MEDROL) injection  40 mg Intravenous Q6H  . potassium chloride  40 mEq Oral TID  . simvastatin  20 mg Oral QHS  . vancomycin  1,000 mg Intravenous Q12H    Continuous Infusions: . sodium chloride 500 mL (01/15/13 1203)    Kendell Bane RD, LDN, CNSC [phone] Pager [phone] After Hours Pager

## 2013-01-17 ENCOUNTER — Inpatient Hospital Stay (HOSPITAL_COMMUNITY): Payer: Medicaid Other

## 2013-01-17 LAB — BASIC METABOLIC PANEL
Calcium: 9.4 mg/dL (ref 8.4–10.5)
Creatinine, Ser: 0.9 mg/dL (ref 0.50–1.10)
GFR calc Af Amer: 79 mL/min — ABNORMAL LOW (ref >90)

## 2013-01-17 LAB — POCT I-STAT 3, ART BLOOD GAS (G3+)
Acid-Base Excess: 4 mmol/L — ABNORMAL HIGH (ref 0.0–2.0)
Bicarbonate: 28.3 mEq/L — ABNORMAL HIGH (ref 20.0–24.0)
Patient temperature: 98.6
TCO2: 30 mmol/L (ref 0–100)

## 2013-01-17 LAB — CBC
MCHC: 36.2 g/dL — ABNORMAL HIGH (ref 30.0–36.0)
MCV: 78.2 fL (ref 78.0–100.0)
Platelets: 376 10*3/uL (ref 150–400)
RDW: 13.4 % (ref 11.5–15.5)
WBC: 18.9 10*3/uL — ABNORMAL HIGH (ref 4.0–10.5)

## 2013-01-17 LAB — LEGIONELLA PROFILE(CULTURE+DFA/SMEAR)

## 2013-01-17 LAB — GLUCOSE, CAPILLARY
Glucose-Capillary: 124 mg/dL — ABNORMAL HIGH (ref 70–99)
Glucose-Capillary: 149 mg/dL — ABNORMAL HIGH (ref 70–99)

## 2013-01-17 LAB — PHOSPHORUS: Phosphorus: 4.6 mg/dL (ref 2.3–4.6)

## 2013-01-17 MED ORDER — PREDNISONE 10 MG PO TABS: 10.0000 mg | ORAL_TABLET | Freq: Every day | ORAL | Status: DC

## 2013-01-17 MED ORDER — ZOLPIDEM TARTRATE 5 MG PO TABS
5.0000 mg | ORAL_TABLET | Freq: Once | ORAL | Status: AC
  Administered 2013-01-18: 5 mg via ORAL
  Filled 2013-01-17: qty 1

## 2013-01-17 MED ORDER — POTASSIUM CHLORIDE CRYS ER 20 MEQ PO TBCR
20.0000 meq | EXTENDED_RELEASE_TABLET | ORAL | Status: AC
  Administered 2013-01-17 (×2): 20 meq via ORAL
  Filled 2013-01-17 (×2): qty 1

## 2013-01-17 MED ORDER — METHYLPREDNISOLONE SODIUM SUCC 40 MG IJ SOLR
20.0000 mg | Freq: Four times a day (QID) | INTRAMUSCULAR | Status: DC
  Filled 2013-01-17 (×4): qty 0.5

## 2013-01-17 MED ORDER — PREDNISONE 20 MG PO TABS: 20.0000 mg | ORAL_TABLET | Freq: Every day | ORAL | Status: DC

## 2013-01-17 MED ORDER — PREDNISONE 5 MG PO TABS: 5.0000 mg | ORAL_TABLET | Freq: Every day | ORAL | Status: DC

## 2013-01-17 MED ORDER — PREDNISONE 20 MG PO TABS
40.0000 mg | ORAL_TABLET | Freq: Every day | ORAL | Status: DC
  Administered 2013-01-18 – 2013-01-20 (×3): 40 mg via ORAL
  Filled 2013-01-17 (×4): qty 2

## 2013-01-17 MED ORDER — PREDNISONE 20 MG PO TABS: 30.0000 mg | ORAL_TABLET | Freq: Every day | ORAL | Status: DC

## 2013-01-17 NOTE — Progress Notes (Signed)
PULMONARY  / CRITICAL CARE MEDICINE  Name: Cynthia [name] MRN: [id number] DOB: 1951-01-05    ADMISSION DATE:  01/08/2013 CONSULTATION DATE:  01/11/13  REFERRING MD:  Dr. Elvera Lennox, Internal medicine [facility]) PRIMARY SERVICE: CCM  CHIEF COMPLAINT:  Cough, SOB  BRIEF PATIENT DESCRIPTION:  62 year old female with chronic bronchitis, HTN, HLD who presented from [facility] with worsening PNA and acute respiratory failure.  SIGNIFICANT EVENTS / STUDIES:  7/2 - S. Pneumo and Legionella Urine Ag - Neg 7/4 Chest Xray - bilateral PNA 7/5 - Acute respiratory failure, Intubated and central line placed 7/5 Bronch - Sputum for culture; no pus noted; secretions minimal 7/5 CT chest - Airspace opacities and secondary pulmonary lobular septal thickening; ? ARDS vs Cardiogenic pulm edema vs Interstitial PNA 7/6 - Febrile overnight 7/7 - HIV antibody - negative; RF elevated at 39.  7/8 - Legionella DFA negative; Echo - EF 55-60%, Grade 1 Diastolic dysfunction; PCP Smear - negative; C3 171 (norm); C4 33 (norm); c-ANCA, p-ANCA, atypical p-ANCA -  Negative; ANA - Negative.  7/8 - Started IV Solumedrol and Diuresis 7/9 - Anti-DS DNA negative  LINES / TUBES: ETT 7/5 >>>  R IJ 7/5>>  CULTURES: Sputum (Bronch) 7/5>> Normal flora Final BCx 7/5 >> No growth final  ANTIBIOTICS: Vanc 7/4>> Ceftriaxone 7/2 >> 7/4 Azithromycin 7/2 >>7/4 Cefepime 7/4 >>7/5 Levaquin 7/4 >>7/7 Ceftaz 7/5 >>  SUBJECTIVE:    VITAL SIGNS: Temp:  [97.3 F (36.3 C)-99.2 F (37.3 C)] 99.2 F (37.3 C) (07/11 0403) Pulse Rate:  [63-110] 72 (07/11 0400) Resp:  [16-25] 23 (07/11 0400) BP: (113-172)/(56-89) 141/83 mmHg (07/11 0200) SpO2:  [65 %-100 %] 99 % (07/11 0400) Arterial Line BP: (127-170)/(55-77) 151/66 mmHg (07/10 1200) FiO2 (%):  [40 %] 40 % (07/10 1100)  HEMODYNAMICS: CVP:  [8 mmHg] 8 mmHg VENTILATOR SETTINGS: Vent Mode:  [-] CPAP;PSV FiO2 (%):  [40 %] 40 % PEEP:  [5 cmH20] 5 cmH20 Pressure Support:  [5  cmH20] 5 cmH20  INTAKE / OUTPUT: Intake/Output     07/10 0701 - 07/11 0700   I.V. (mL/kg) 433 (7.7)   IV Piggyback 500   Total Intake(mL/kg) 933 (16.7)   Urine (mL/kg/hr) 2335 (1.7)   Stool 800 (0.6)   Total Output 3135   Net -2202        PHYSICAL EXAMINATION: General:  Awake, alert, NAD. Neuro: AO x 3.  Exam nonfocal. Cardiovascular: RRR. No m/r/g appreciated. Lungs: Decreased air movement bilaterally. Rales noted bibasilar. Abdomen: soft, nondistended.  No organomegaly noted. Extremities: No LE edema appreciated. Skin:  Intact.  LABS:  Recent Labs Lab 01/11/13 1600  01/12/13 0500  01/15/13 0425  01/15/13 1539  01/16/13 0310 01/16/13 0428 01/17/13 0436 01/17/13 0518  HGB 9.0*  < >  --   < > 8.2*  --   --   --  8.7*  --   --  8.3*  WBC 16.5*  < >  --   < > 14.3*  --   --   --  18.9*  --   --  18.9*  PLT 206  < >  --   < > 300  --   --   --  373  --   --  376  NA 138  < >  --   < > 143  --   --   --  147*  --   --  137  K 3.2*  < >  --   < >  3.8  --   --   < > 3.7  --   --  3.4*  CL 96  < >  --   < > 101  --   --   --  103  --   --  97  CO2 31  < >  --   < > 32  --   --   --  32  --   --  28  GLUCOSE 161*  < >  --   < > 190*  --   --   --  209*  --   --  134*  BUN 9  < >  --   < > 38*  --   --   --  63*  --   --  50*  CREATININE 0.74  < >  --   < > 0.78  --   --   --  0.97  --   --  0.90  CALCIUM 8.5  < >  --   < > 9.7  --   --   --  9.7  --   --  9.4  MG  --   --   --   < > 2.1  --   --   --  2.5  --   --  2.0  PHOS  --   --   --   < > 2.9  --   --   --  3.8  --   --  4.6  AST 41*  --  33  --   --   --   --   --   --   --   --   --   ALT 29  --  24  --   --   --   --   --   --   --   --   --   ALKPHOS 60  --  51  --   --   --   --   --   --   --   --   --   BILITOT 0.3  --  0.2*  --   --   --   --   --   --   --   --   --   PROT 6.8  --  6.1  --   --   --   --   --   --   --   --   --   ALBUMIN 2.7*  --  2.1*  --   --   --   --   --   --   --   --   --   INR 1.27   --   --   --   --   --   --   --   --   --   --   --   PHART  --   --   --   < >  --   < > 7.411  --   --  7.409 7.435  --   PCO2ART  --   --   --   < >  --   < > 55.2*  --   --  48.5* 42.1  --   PO2ART  --   --   --   < >  --   < > 120.0*  --   --  144.0* 84.0  --   < > =  values in this interval not displayed.  Recent Labs Lab 01/16/13 1228 01/16/13 1628 01/16/13 2048 01/16/13 2340 01/17/13 0401  GLUCAP 148* 168* 192* 137* 115*   CXR:   ASSESSMENT / PLAN:   PULMONARY A: Acute respiratory failure secondary to HCAP and likely diastolic CHF exacerbation P:   - Patient doing well s/p extubation  - Continuing Abx - Ceftaz and Vanc for HCAP (Stop time added; 8 days of therapy) - Patient improved with steroids and diuresis. - Change solumedrol to 40 mg PO daily x5 days then 30 mg PO daily x5 days, 20 mg PO x5 days, 10 mg PO then 5 mg PO daily x5 days then d/c; Continue Albuterol/Atrovent. - D/C Lasix today.  CARDIOVASCULAR A: Hemodynamically stable, No cardiac history, HTN P:  - DVT PPx - Lovenox. - Echo revealed preserved EF with Grade 1 Diastolic dysfunction. - Diuresed well.  Now Neg 3700 mL for admission - HTN - Continuing PRN Hydralazine for SBP >160 or DBP >100  RENAL A:  No Hx of renal disease, Creatinine normal; Hypernatremia; Hypokalemia P:   - Creatinine WNL. - Hypernatremia resolved - Repleting K - Daily BMP  GASTROINTESTINAL A:  No acute issues P:   - Tolerating Dysphagia 3 diet  HEMATOLOGIC A:  DVT PPx, Leukocytosis secondary to HCAP P:  - Lovenox for DVT PPx- follow crt closely.  INFECTIOUS A:  Bilateral infiltrates >>HCAP (given hospitalization since end june), ?auto-immune pneumonitis,  P:   - Continuing abx - Vanc/Ceftaz as above. Will plan to discontinue after 7/12 (8 day course since placement of ETT tube) - Cultures - Negative. - Patient improving with Diuresis and Steroids (see above)  ENDOCRINE A: Hyperglycemia P:   - CBG's Q4, SSI -  Moderate  NEUROLOGIC A:  Acute encephalopathy secondary to Acute respiratory failure - Resolved P:   - Doing well Awake, alert and oriented.  TODAY'S SUMMARY:  Transfer to Telemetry, Continue tapering steroids, stop diuresis, Continue Abx for 8 day course.  Everlene Other DO Family Medicine PGY-2   Will transfer to SDU and to [facility], PCCM will sign off, please call back if needed.  Alyson Reedy, M.D. [facility] Pulmonary/Critical Care Medicine. Pager: [phone]. After hours pager: [phone].

## 2013-01-17 NOTE — Progress Notes (Signed)
Physical Therapy Treatment Patient Details Name: Cynthia [name] MRN: [id number] DOB: December 26, 1950 Today's Date: 01/17/2013 Time: 7846-9629 PT Time Calculation (min): 28 min  PT Assessment / Plan / Recommendation  PT Comments   Pt progressing well with mobility able to ambulate in the hall today. Pt very pleasant and eager to progress. Encouraged HEP and increased mobility with nursing particularly over the weekend. Will continue to follow.   Follow Up Recommendations        Does the patient have the potential to tolerate intense rehabilitation     Barriers to Discharge        Equipment Recommendations       Recommendations for Other Services    Frequency     Progress towards PT Goals Progress towards PT goals: Goals met and updated - see care plan  Plan Current plan remains appropriate    Precautions / Restrictions Precautions Precautions: Fall Precaution Comments: rectal tube   Pertinent Vitals/Pain 7/10 HA sats down to 88% on 4L with mobility with cues to recover to 92 95% at rest on 3L  HR 90    Mobility  Bed Mobility Bed Mobility: Left Sidelying to Sit;Sitting - Scoot to Edge of Bed Rolling Left: 5: Supervision;With rail Left Sidelying to Sit: 4: Min assist Sitting - Scoot to Edge of Bed: 5: Supervision Details for Bed Mobility Assistance: cueing for sequence Transfers Sit to Stand: 4: Min assist;From bed Stand to Sit: 4: Min guard;To chair/3-in-1;With armrests Details for Transfer Assistance: cueing for hand placement Ambulation/Gait Ambulation/Gait Assistance: 4: Min guard Ambulation Distance (Feet): 120 Feet Assistive device: Rolling walker Ambulation/Gait Assistance Details: cues for posture, position in Rw and breathing technique Gait Pattern: Step-through pattern;Decreased stride length Gait velocity: decreased Stairs: No    Exercises General Exercises - Lower Extremity Long Arc Quad: AROM;5 reps;Both;Seated Hip Flexion/Marching: AROM;5  reps;Both;Seated   PT Diagnosis:    PT Problem List:   PT Treatment Interventions:     PT Goals (current goals can now be found in the care plan section)    Visit Information  Last PT Received On: 01/17/13 Assistance Needed: +1 History of Present Illness: Pt with acute respiratory failure from [facility] with VDRF 7/5-7/10, chronic bronchitis    Subjective Data      Cognition  Cognition Arousal/Alertness: Awake/alert Behavior During Therapy: WFL for tasks assessed/performed Overall Cognitive Status: Within Functional Limits for tasks assessed    Balance     End of Session PT - End of Session Equipment Utilized During Treatment: Gait belt;Oxygen Activity Tolerance: Patient tolerated treatment well Patient left: in chair;with call bell/phone within reach Nurse Communication: Mobility status   GP     Toney Sang [facility] 01/17/2013, 8:17 AM Delaney Meigs, PT [phone]

## 2013-01-17 NOTE — Progress Notes (Signed)
Cynthia [name] is a 62 y.o. female patient who transferred  From 2100 awake, alert  & orientated  X 3, No Order, VSS - Blood pressure 113/64, pulse 103, temperature 99.3 F (37.4 C), temperature source Oral, resp. rate 20, height 5\' 5"  (1.651 m), weight 55.9 kg (123 lb 3.8 oz), SpO2 90.00%., O2   @ 3 L nasal cannular, no c/o shortness of breath, no c/o chest pain, no distress noted. Tele #[id number] placed and pt is currently running:normal sinus rhythm.   IV site WDL: hand right, left hand condition patent and no redness with a transparent dsg that's clean dry and intact. Right IJ removed with dressing to be left on until 7/12 1800  Allergies:   Allergies  Allergen Reactions  . Aspirin     REACTION: Stomach upset     Past Medical History  Diagnosis Date  . Migraine   . Hypertension   . Bronchitis   . High blood cholesterol level     Pt orientation to unit, room and routine. SR up x 2, fall risk assessment complete with Patient and family verbalizing understanding of risks associated with falls. Pt verbalizes an understanding of how to use the call bell and to call for help before getting out of bed.  Skin, clean-dry- intact without evidence of bruising, or skin tears.   No evidence of skin break down noted on exam.     Will cont to monitor and assist as needed.  Cindra Eves, RN 01/17/2013 7:19 PM

## 2013-01-17 NOTE — Progress Notes (Signed)
Speech Language Pathology Dysphagia Treatment Patient Details Name: Cynthia [name] MRN: [id number] DOB: 1951/01/18 Today's Date: 01/17/2013 Time: 1308-6578 SLP Time Calculation (min): 8 min  Assessment / Plan / Recommendation Clinical Impression  Dysphagia treatment included observation of regular texture for possible upgrade and thi liquids.  Dry cough present prior to po's and noted 5 minutes after po consumption.  No evidence of aspiration or penetraion.  CXR today noted persistent asymmetric left-sided interstitial and airspace, this likely represents edema.  Functional mastication of cracker.  Recommend upgrade diet texture to regular and continue thin liquids.  ST will sign off.  Please reconsult if difficulties arise.    Diet Recommendation  Initiate / Change Diet: Regular;Thin liquid    SLP Plan All goals met   Pertinent Vitals/Pain Headache, RN gave meds earlier   Swallowing Goals  SLP Swallowing Goals Patient will consume recommended diet without observed clinical signs of aspiration with: Modified independent assistance Swallow Study Goal #1 - Progress: Met Goal #3: Patient will demonstrate effecient mastication of regular textures Mod I  Swallow Study Goal #3 - Progress: Met  General Temperature Spikes Noted: No Respiratory Status: Supplemental O2 delivered via (comment) Behavior/Cognition: Alert;Cooperative;Pleasant mood Oral Cavity - Dentition: Dentures, not available (has some anterior lower dentition) Patient Positioning: Upright in chair  Oral Cavity - Oral Hygiene Does patient have any of the following "at risk" factors?: Oxygen therapy - cannula, mask, simple oxygen devices Brush patient's teeth BID with toothbrush (using toothpaste with fluoride): Yes Patient is AT RISK - Oral Care Protocol followed (see row info): Yes   Dysphagia Treatment Treatment focused on: Skilled observation of diet tolerance Treatment Methods/Modalities: Skilled observation Patient  observed directly with PO's: Yes Type of PO's observed: Regular;Thin liquids Feeding: Able to feed self Liquids provided via: Cup;Straw Type of cueing: Verbal Amount of cueing: Minimal        Royce Macadamia M.Ed [employer] [phone]  01/17/2013

## 2013-01-17 NOTE — Progress Notes (Signed)
[facility] ADULT ICU REPLACEMENT PROTOCOL FOR AM LAB REPLACEMENT ONLY  The patient does apply for the [facility] Adult ICU Electrolyte Replacment Protocol based on the criteria listed below:   1. Is GFR >/= 40 ml/min? yes  Patient's GFR today is 79 2. Is urine output >/= 0.5 ml/kg/hr for the last 6 hours? yes Patient's UOP is 1.5 ml/kg/hr 3. Is BUN < 60 mg/dL? yes  Patient's BUN today is 50 4. Abnormal electrolyte(s): K 3.4 5. Ordered repletion with: per protocol 6. If [name] panic level lab has been reported, has the CCM MD in charge been notified? yes.   Physician:  Dr Darrick Penna  Cynthia [name], Cynthia [name] 01/17/2013 6:07 AM

## 2013-01-17 NOTE — Progress Notes (Signed)
ANTIBIOTIC CONSULT NOTE - FOLLOW UP  Pharmacy Consult for Vancomycin/Ceftazidime Indication: pneumonia  Allergies  Allergen Reactions  . Aspirin     REACTION: Stomach upset    Patient Measurements: Height: 5\' 5"  (165.1 cm) Weight: 123 lb 3.8 oz (55.9 kg) IBW/kg (Calculated) : 57  Vital Signs: Temp: 97.9 F (36.6 C) (07/11 1149) Temp src: Oral (07/11 1149) BP: 141/88 mmHg (07/11 1100) Pulse Rate: 88 (07/11 1110) Intake/Output from previous day: 07/10 0701 - 07/11 0700 In: 953 [I.V.:453; IV Piggyback:500] Out: 3135 [Urine:2335; Stool:800] Intake/Output from this shift: Total I/O In: 80 [I.V.:80] Out: 290 [Urine:290]  Labs:  Recent Labs  01/15/13 0425 01/16/13 0310 01/17/13 0518  WBC 14.3* 18.9* 18.9*  HGB 8.2* 8.7* 8.3*  PLT 300 373 376  CREATININE 0.78 0.97 0.90   Estimated Creatinine Clearance: 58.7 ml/min (by C-G formula based on Cr of 0.9). No results found for this basename: VANCOTROUGH, Leodis Binet, VANCORANDOM, GENTTROUGH, GENTPEAK, GENTRANDOM, TOBRATROUGH, TOBRAPEAK, TOBRARND, AMIKACINPEAK, AMIKACINTROU, AMIKACIN,  in the last 72 hours   Microbiology: Recent Results (from the past 720 hour(s))  CULTURE, EXPECTORATED SPUTUM-ASSESSMENT     Status: None   Collection Time    01/08/13  6:55 PM      Result Value Range Status   Specimen Description SPUTUM   Final   Special Requests NONE   Final   Sputum evaluation     Final   Value: THIS SPECIMEN IS ACCEPTABLE. RESPIRATORY CULTURE REPORT TO FOLLOW.   Report Status 01/08/2013 FINAL   Final  CULTURE, RESPIRATORY (NON-EXPECTORATED)     Status: None   Collection Time    01/08/13  6:55 PM      Result Value Range Status   Specimen Description SPUTUM   Final   Special Requests NONE   Final   Gram Stain     Final   Value: RARE WBC PRESENT, PREDOMINANTLY PMN     MODERATE SQUAMOUS EPITHELIAL CELLS PRESENT     RARE GRAM POSITIVE COCCI IN PAIRS   Culture NORMAL OROPHARYNGEAL FLORA   Final   Report Status  01/12/2013 FINAL   Final  CULTURE, BLOOD (ROUTINE X 2)     Status: None   Collection Time    01/11/13  3:27 AM      Result Value Range Status   Specimen Description BLOOD RIGHT ANTECUBITAL   Final   Special Requests BOTTLES DRAWN AEROBIC AND ANAEROBIC 6CC   Final   Culture NO GROWTH 5 DAYS   Final   Report Status 01/16/2013 FINAL   Final  CULTURE, BLOOD (ROUTINE X 2)     Status: None   Collection Time    01/11/13  3:52 AM      Result Value Range Status   Specimen Description BLOOD RIGHT HAND   Final   Special Requests BOTTLES DRAWN AEROBIC AND ANAEROBIC 6CC   Final   Culture NO GROWTH 5 DAYS   Final   Report Status 01/16/2013 FINAL   Final  MRSA PCR SCREENING     Status: None   Collection Time    01/11/13 11:00 AM      Result Value Range Status   MRSA by PCR NEGATIVE  NEGATIVE Final   Comment:            The GeneXpert MRSA Assay (FDA     approved for NASAL specimens     only), is one component of a     comprehensive MRSA colonization     surveillance program. It is  not     intended to diagnose MRSA     infection nor to guide or     monitor treatment for     MRSA infections.  PNEUMOCYSTIS JIROVECI SMEAR BY DFA     Status: None   Collection Time    01/11/13  1:15 PM      Result Value Range Status   Specimen Source-PJSRC ENDOTRACHEAL   Final   Comment: ASPIRATE   Pneumocystis jiroveci Ag NEGATIVE   Final   Comment: Performed at [facility]  CULTURE, RESPIRATORY (NON-EXPECTORATED)     Status: None   Collection Time    01/11/13  1:15 PM      Result Value Range Status   Specimen Description BRONCHIAL WASHINGS PER AMEH S.,RN 01/11/13 1524   Final   Special Requests NONE   Final   Gram Stain     Final   Value: ABUNDANT WBC PRESENT,BOTH PMN AND MONONUCLEAR     NO SQUAMOUS EPITHELIAL CELLS SEEN     NO ORGANISMS SEEN   Culture Non-Pathogenic Oropharyngeal-type Flora Isolated.   Final   Report Status 01/14/2013 FINAL   Final  CLOSTRIDIUM DIFFICILE BY PCR      Status: None   Collection Time    01/15/13  6:32 PM      Result Value Range Status   C difficile by pcr NEGATIVE  NEGATIVE Final    Anti-infectives   Start     Dose/Rate Route Frequency Ordered Stop   01/14/13 2000  vancomycin (VANCOCIN) IVPB 1000 mg/200 mL premix     1,000 mg 200 mL/hr over 60 Minutes Intravenous Every 12 hours 01/14/13 0911 01/19/13 1046   01/12/13 1600  levofloxacin (LEVAQUIN) IVPB 750 mg  Status:  Discontinued     750 mg 100 mL/hr over 90 Minutes Intravenous Every 24 hours 01/12/13 1516 01/13/13 1041   01/11/13 1400  cefTAZidime (FORTAZ) 2 g in dextrose 5 % 50 mL IVPB     2 g 100 mL/hr over 30 Minutes Intravenous 3 times per day 01/11/13 1338 01/19/13 1359   01/10/13 1700  vancomycin (VANCOCIN) IVPB 750 mg/150 ml premix  Status:  Discontinued     750 mg 150 mL/hr over 60 Minutes Intravenous Every 12 hours 01/10/13 1453 01/14/13 0911   01/10/13 1600  levofloxacin (LEVAQUIN) IVPB 750 mg  Status:  Discontinued     750 mg 100 mL/hr over 90 Minutes Intravenous Every 24 hours 01/10/13 1441 01/12/13 1515   01/10/13 1500  ceFEPIme (MAXIPIME) 1 g in dextrose 5 % 50 mL IVPB  Status:  Discontinued     1 g 100 mL/hr over 30 Minutes Intravenous 3 times per day 01/10/13 1441 01/11/13 1337   01/09/13 0300  azithromycin (ZITHROMAX) 500 mg in dextrose 5 % 250 mL IVPB  Status:  Discontinued     500 mg 250 mL/hr over 60 Minutes Intravenous Every 24 hours 01/08/13 0839 01/10/13 1400   01/09/13 0200  cefTRIAXone (ROCEPHIN) 1 g in dextrose 5 % 50 mL IVPB  Status:  Discontinued     1 g 100 mL/hr over 30 Minutes Intravenous Every 24 hours 01/08/13 0839 01/10/13 1400   01/08/13 0230  cefTRIAXone (ROCEPHIN) 1 g in dextrose 5 % 50 mL IVPB     1 g 100 mL/hr over 30 Minutes Intravenous  Once 01/08/13 0217 01/08/13 0326   01/08/13 0230  azithromycin (ZITHROMAX) tablet 500 mg     500 mg Oral  Once 01/08/13 0217 01/08/13  0454      Assessment: 62 y/o F with PNA who continues on  vancomycin and ceftazidime. WBC 18.9, afebrile, Scr fairly stable. Previous vancomycin trough was 12, and dose increased to 1g q 12 hrs.  Also on ceftazidime 2g IV q 8 hrs.  Dosing appropriate for current renal function.  Cultures negative to date, antibiotics scheduled to end tomorrow.  Goal of Therapy:  Vancomycin trough level 15-20 mcg/ml  Plan:  -Continue vancomycin to 1000mg  IV q12h -Continue ceftazidime 2g IV q8h -Will not recheck vancomycin trough at this time since scheduled to end tomorrow.  Tad Moore, BCPS  Clinical Pharmacist Pager [phone]  01/17/2013 2:04 PM

## 2013-01-17 NOTE — Progress Notes (Signed)
eLink Physician-Brief Progress Note Patient Name: Cynthia [name] DOB: 22-Dec-1950 MRN: [id number]  Date of Service  01/17/2013   HPI/Events of Note  Insomnia   eICU Interventions  Ambien 5 mg po times one dose now   Intervention Category Minor Interventions: Routine modifications to care plan (e.g. PRN medications for pain, fever)  Cynthia [name] 01/17/2013, 11:42 PM

## 2013-01-18 LAB — GLUCOSE, CAPILLARY: Glucose-Capillary: 93 mg/dL (ref 70–99)

## 2013-01-18 LAB — BASIC METABOLIC PANEL
CO2: 28 mEq/L (ref 19–32)
GFR calc non Af Amer: 89 mL/min — ABNORMAL LOW (ref >90)
Glucose, Bld: 105 mg/dL — ABNORMAL HIGH (ref 70–99)
Potassium: 3.5 mEq/L (ref 3.5–5.1)
Sodium: 138 mEq/L (ref 135–145)

## 2013-01-18 MED ORDER — TRAZODONE 25 MG HALF TABLET
25.0000 mg | ORAL_TABLET | Freq: Every evening | ORAL | Status: DC | PRN
  Administered 2013-01-18: 25 mg via ORAL
  Filled 2013-01-18: qty 1

## 2013-01-18 MED ORDER — GUAIFENESIN ER 600 MG PO TB12
1200.0000 mg | ORAL_TABLET | Freq: Two times a day (BID) | ORAL | Status: DC
  Administered 2013-01-18 – 2013-01-20 (×5): 1200 mg via ORAL
  Filled 2013-01-18 (×6): qty 2

## 2013-01-18 MED ORDER — POTASSIUM CHLORIDE CRYS ER 20 MEQ PO TBCR
60.0000 meq | EXTENDED_RELEASE_TABLET | Freq: Once | ORAL | Status: AC
  Administered 2013-01-18: 60 meq via ORAL
  Filled 2013-01-18: qty 3

## 2013-01-18 MED ORDER — FUROSEMIDE 10 MG/ML IJ SOLN
40.0000 mg | Freq: Three times a day (TID) | INTRAMUSCULAR | Status: DC
  Administered 2013-01-18 – 2013-01-20 (×7): 40 mg via INTRAVENOUS
  Filled 2013-01-18 (×12): qty 4

## 2013-01-18 NOTE — Progress Notes (Signed)
Patient states that numbness to right leg is all gone now. Patient states that her rt leg is hurting when getting up. Will continue to monitor patient. Nelda Marseille, RN

## 2013-01-18 NOTE — Progress Notes (Signed)
[facility] PROGRESS NOTE  Cynthia [name]:[id number] DOB: February 09, 1951 DOA: 01/08/2013 PCP: Evlyn Courier, MD  HPI/Subjective: Breathing is better, feels some pain in her right thigh.  Assessment/Plan:  Acute respiratory failure -Ventilator dependent respiratory failure, patient spent 5 days in the ventilator. -This is secondary to HCAP and diastolic CHF exacerbation. -This is currently improving, patient is on 2 L of oxygen.  Pneumonia -Healthcare associated pneumonia, patient is on ceftazidime and vancomycin. -Continue antibiotics for a total of 8 days per recommendation from CCM. -Continue supportive management with oxygen, antitussives and mucolytics. -There was question about autoimmune pneumonitis, patient followup as outpatient.  Acute encephalopathy -This is secondary to acute respiratory failure, this is resolved. Patient currently awake and oriented.  Acute diastolic CHF -2-D echo showed normal LVEF with grade 1 diastolic dysfunction. -Patient was started on diuresis she had over 3700 mL of negative fluid balance since admission.  Hypertension -Calcium hydralazine, for high blood pressure. -Currently blood pressure in reasonable, if trended high we will start blood pressure medicine.   Code Status: Full code Family Communication: Plan discussed with patient Disposition Plan: Remain inpatient   Consultants:  Patient was under CCM, [facility] assumed care on 01/18/2013  Procedures:  Ventilator dependent respiratory failure intubation, extubation on 01/16/2013.  Placement of central line done by Feinstein on 01/11/2013  Antibiotics:  Ceftaz    Objective: Filed Vitals:   01/17/13 1846 01/17/13 2112 01/18/13 0525 01/18/13 0911  BP: 113/64 115/63 108/66   Pulse: 103 84 82   Temp: 99.3 F (37.4 C) 98.6 F (37 C) 98.8 F (37.1 C)   TempSrc: Oral Oral Oral   Resp: 20 19 20    Height:      Weight:      SpO2: 90% 93% 97% 97%     Intake/Output Summary (Last 24 hours) at 01/18/13 1347 Last data filed at 01/18/13 0556  Gross per 24 hour  Intake    730 ml  Output    800 ml  Net    -70 ml   Filed Weights   01/14/13 0500 01/15/13 0500 01/16/13 0500  Weight: 59.2 kg (130 lb 8.2 oz) 57.6 kg (126 lb 15.8 oz) 55.9 kg (123 lb 3.8 oz)    Exam: General: Alert and awake, oriented x3, not in any acute distress. HEENT: anicteric sclera, pupils reactive to light and accommodation, EOMI CVS: S1-S2 clear, no murmur rubs or gallops Chest: clear to auscultation bilaterally, no wheezing, rales or rhonchi Abdomen: soft nontender, nondistended, normal bowel sounds, no organomegaly Extremities: no cyanosis, clubbing or edema noted bilaterally Neuro: Cranial nerves II-XII intact, no focal neurological deficits  Data Reviewed: Basic Metabolic Panel:  Recent Labs Lab 01/13/13 0500 01/14/13 0430 01/15/13 0425 01/15/13 1818 01/16/13 0310 01/17/13 0518 01/18/13 0545  NA 136 142 143  --  147* 137 138  K 3.3* 3.9 3.8 4.3 3.7 3.4* 3.5  CL 100 105 101  --  103 97 101  CO2 29 29 32  --  32 28 28  GLUCOSE 140* 124* 190*  --  209* 134* 105*  BUN 16 23 38*  --  63* 50* 24*  CREATININE 0.62 0.70 0.78  --  0.97 0.90 0.79  CALCIUM 7.9* 8.7 9.7  --  9.7 9.4 9.3  MG  --  2.0 2.1  --  2.5 2.0  --   PHOS  --  2.5 2.9  --  3.8 4.6  --    Liver Function Tests:  Recent Labs Lab 01/11/13  1600 01/12/13 0500  AST 41* 33  ALT 29 24  ALKPHOS 60 51  BILITOT 0.3 0.2*  PROT 6.8 6.1  ALBUMIN 2.7* 2.1*   No results found for this basename: LIPASE, AMYLASE,  in the last 168 hours No results found for this basename: AMMONIA,  in the last 168 hours CBC:  Recent Labs Lab 01/11/13 1600 01/12/13 0400 01/13/13 0500 01/14/13 0430 01/15/13 0425 01/16/13 0310 01/17/13 0518  WBC 16.5* 13.8* 13.3* 14.3* 14.3* 18.9* 18.9*  NEUTROABS 15.0* 12.1* 11.2*  --   --   --   --   HGB 9.0* 8.4* 8.0* 7.6* 8.2* 8.7* 8.3*  HCT 26.7* 24.8* 24.0*  22.4* 23.7* 24.9* 22.9*  MCV 81.7 82.7 81.6 81.8 79.8 79.6 78.2  PLT 206 206 206 231 300 373 376   Cardiac Enzymes: No results found for this basename: CKTOTAL, CKMB, CKMBINDEX, TROPONINI,  in the last 168 hours BNP (last 3 results)  Recent Labs  01/08/13 0217  PROBNP 247.0*   CBG:  Recent Labs Lab 01/17/13 2010 01/17/13 2335 01/18/13 0421 01/18/13 0807 01/18/13 1143  GLUCAP 119* 124* 93 119* 108*    Recent Results (from the past 240 hour(s))  CULTURE, EXPECTORATED SPUTUM-ASSESSMENT     Status: None   Collection Time    01/08/13  6:55 PM      Result Value Range Status   Specimen Description SPUTUM   Final   Special Requests NONE   Final   Sputum evaluation     Final   Value: THIS SPECIMEN IS ACCEPTABLE. RESPIRATORY CULTURE REPORT TO FOLLOW.   Report Status 01/08/2013 FINAL   Final  CULTURE, RESPIRATORY (NON-EXPECTORATED)     Status: None   Collection Time    01/08/13  6:55 PM      Result Value Range Status   Specimen Description SPUTUM   Final   Special Requests NONE   Final   Gram Stain     Final   Value: RARE WBC PRESENT, PREDOMINANTLY PMN     MODERATE SQUAMOUS EPITHELIAL CELLS PRESENT     RARE GRAM POSITIVE COCCI IN PAIRS   Culture NORMAL OROPHARYNGEAL FLORA   Final   Report Status 01/12/2013 FINAL   Final  CULTURE, BLOOD (ROUTINE X 2)     Status: None   Collection Time    01/11/13  3:27 AM      Result Value Range Status   Specimen Description BLOOD RIGHT ANTECUBITAL   Final   Special Requests BOTTLES DRAWN AEROBIC AND ANAEROBIC 6CC   Final   Culture NO GROWTH 5 DAYS   Final   Report Status 01/16/2013 FINAL   Final  CULTURE, BLOOD (ROUTINE X 2)     Status: None   Collection Time    01/11/13  3:52 AM      Result Value Range Status   Specimen Description BLOOD RIGHT HAND   Final   Special Requests BOTTLES DRAWN AEROBIC AND ANAEROBIC 6CC   Final   Culture NO GROWTH 5 DAYS   Final   Report Status 01/16/2013 FINAL   Final  MRSA PCR SCREENING     Status:  None   Collection Time    01/11/13 11:00 AM      Result Value Range Status   MRSA by PCR NEGATIVE  NEGATIVE Final   Comment:            The GeneXpert MRSA Assay (FDA     approved for NASAL specimens     only),  is one component of a     comprehensive MRSA colonization     surveillance program. It is not     intended to diagnose MRSA     infection nor to guide or     monitor treatment for     MRSA infections.  PNEUMOCYSTIS JIROVECI SMEAR BY DFA     Status: None   Collection Time    01/11/13  1:15 PM      Result Value Range Status   Specimen Source-PJSRC ENDOTRACHEAL   Final   Comment: ASPIRATE   Pneumocystis jiroveci Ag NEGATIVE   Final   Comment: Performed at [facility]  CULTURE, RESPIRATORY (NON-EXPECTORATED)     Status: None   Collection Time    01/11/13  1:15 PM      Result Value Range Status   Specimen Description BRONCHIAL WASHINGS PER AMEH S.,RN 01/11/13 1524   Final   Special Requests NONE   Final   Gram Stain     Final   Value: ABUNDANT WBC PRESENT,BOTH PMN AND MONONUCLEAR     NO SQUAMOUS EPITHELIAL CELLS SEEN     NO ORGANISMS SEEN   Culture Non-Pathogenic Oropharyngeal-type Flora Isolated.   Final   Report Status 01/14/2013 FINAL   Final  CLOSTRIDIUM DIFFICILE BY PCR     Status: None   Collection Time    01/15/13  6:32 PM      Result Value Range Status   C difficile by pcr NEGATIVE  NEGATIVE Final     Studies: Dg Chest Port 1 View  01/17/2013   *RADIOLOGY REPORT*  Clinical Data: Endotracheal tube replacement.  PORTABLE CHEST - 1 VIEW  Comparison: One-view chest 01/16/2013.  Findings: The patient has been extubated.  A right IJ line is stable.  The NG tube was removed as well.  Asymmetric left-sided interstitial and airspace disease persists.  More mild disease on the right is stable.  The visualized soft tissues and bony thorax are unremarkable.  IMPRESSION:  1.  Status post extubation. 2.  Persistent asymmetric left-sided interstitial and airspace  disease.  This likely represents edema.   Original Report Authenticated By: Marin Roberts, M.D.    Scheduled Meds: . cefTAZidime (FORTAZ)  IV  2 g Intravenous Q8H  . enoxaparin (LOVENOX) injection  40 mg Subcutaneous Q24H  . insulin aspart  0-15 Units Subcutaneous Q4H  . ipratropium  0.5 mg Nebulization Q6H  . levalbuterol  0.63 mg Nebulization Q6H  . predniSONE  40 mg Oral Q breakfast   Followed by  . [START ON 01/23/2013] predniSONE  30 mg Oral Q breakfast   Followed by  . [START ON 01/28/2013] predniSONE  20 mg Oral Q breakfast   Followed by  . [START ON 02/02/2013] predniSONE  10 mg Oral Q breakfast   Followed by  . [START ON 02/07/2013] predniSONE  5 mg Oral Q breakfast  . simvastatin  20 mg Oral QHS  . vancomycin  1,000 mg Intravenous Q12H   Continuous Infusions: . sodium chloride 500 mL (01/15/13 1203)    Principal Problem:   Acute respiratory failure with hypoxia Active Problems:   HYPERLIPIDEMIA   MIGRAINE HEADACHE   HYPERTENSION   BRONCHITIS, CHRONIC   CAP (community acquired pneumonia)   Leukocytosis, unspecified   Sinus tachycardia    Time spent: 35 minutes    [facility] A  [facility] Pager [phone]. If 7PM-7AM, please contact night-coverage at [email or web address], password [email or web address] 01/18/2013, 1:47 PM  LOS: 10 days

## 2013-01-18 NOTE — Progress Notes (Signed)
Notified Dr. Darrick Penna that patient states that her right leg from her knee to shin is numb, not her foot. Patient able to move leg and feel me touching her leg. Asked patient when her rt leg became numb, pt stated a little after lunch today. Dr. Darrick Penna stated just to monitor, no new orders given. Will continue to monitor patient. Nelda Marseille, RN

## 2013-01-19 ENCOUNTER — Inpatient Hospital Stay (HOSPITAL_COMMUNITY): Payer: Medicaid Other

## 2013-01-19 LAB — CBC
HCT: 28.4 % — ABNORMAL LOW (ref 36.0–46.0)
MCH: 27 pg (ref 26.0–34.0)
MCV: 79.8 fL (ref 78.0–100.0)
Platelets: 435 10*3/uL — ABNORMAL HIGH (ref 150–400)
RDW: 13.6 % (ref 11.5–15.5)
WBC: 19.9 10*3/uL — ABNORMAL HIGH (ref 4.0–10.5)

## 2013-01-19 LAB — BASIC METABOLIC PANEL
BUN: 19 mg/dL (ref 6–23)
CO2: 30 mEq/L (ref 19–32)
Calcium: 9.7 mg/dL (ref 8.4–10.5)
Chloride: 98 mEq/L (ref 96–112)
Creatinine, Ser: 0.81 mg/dL (ref 0.50–1.10)
Glucose, Bld: 138 mg/dL — ABNORMAL HIGH (ref 70–99)

## 2013-01-19 LAB — GLUCOSE, CAPILLARY
Glucose-Capillary: 95 mg/dL (ref 70–99)
Glucose-Capillary: 137 mg/dL — ABNORMAL HIGH (ref 70–99)
Glucose-Capillary: 150 mg/dL — ABNORMAL HIGH (ref 70–99)

## 2013-01-19 MED ORDER — INSULIN ASPART 100 UNIT/ML ~~LOC~~ SOLN
0.0000 [IU] | Freq: Three times a day (TID) | SUBCUTANEOUS | Status: DC
  Administered 2013-01-20: 2 [IU] via SUBCUTANEOUS

## 2013-01-19 NOTE — Progress Notes (Signed)
Pt ambulated well in hallway while using walker and oxygen.  Will continue to gradually wean off oxygen and encourage activity.  Pt felt very comfortable with walker and may need it when discharged.

## 2013-01-19 NOTE — Progress Notes (Signed)
[facility] PROGRESS NOTE  Cynthia [name]:[id number] DOB: January 30, 1951 DOA: 01/08/2013 PCP: Evlyn Courier, MD  HPI/Subjective: Husband at bedside, feels patient is sluggish, DC any narcotics or benzos (if any). Later patient walks around and felt much better.  Assessment/Plan:  Acute respiratory failure -Ventilator dependent respiratory failure, patient spent 5 days in the ventilator. -This is secondary to HCAP and diastolic CHF exacerbation. -This is currently improving, patient is on 2 L of oxygen. -We will wean off of nasal oxygen.   Pneumonia -Healthcare associated pneumonia, patient is on ceftazidime and vancomycin. -Continue antibiotics for a total of 8 days per recommendation from CCM. -Continue supportive management with oxygen, antitussives and mucolytics. -There was question about autoimmune pneumonitis, patient followup as outpatient. -She is to have last dose of antibiotics today.  Acute encephalopathy -This is secondary to acute respiratory failure, this is resolved. Patient currently awake and oriented.  Acute diastolic CHF -2-D echo showed normal LVEF with grade 1 diastolic dysfunction. -Patient was started on diuresis she had over 3700 mL of negative fluid balance since admission.  Hypertension -Calcium hydralazine, for high blood pressure. -Currently blood pressure in reasonable, if trended high we will start blood pressure medicine.   Code Status: Full code Family Communication: Plan discussed with patient Disposition Plan: Remain inpatient, likely can be discharged in a.m. HHPT recommended.   Consultants:  Patient was under CCM, [facility] assumed care on 01/18/2013  Procedures:  Ventilator dependent respiratory failure intubation, extubation on 01/16/2013.  Placement of central line done by Feinstein on 01/11/2013  Antibiotics:  Ceftaz    Objective: Filed Vitals:   01/18/13 2101 01/18/13 2128 01/19/13 0205 01/19/13 0444   BP:  117/73  121/73  Pulse: 87 88 85 80  Temp:  98.2 F (36.8 C)  100 F (37.8 C)  TempSrc:  Oral  Oral  Resp: 18 20 18 18   Height:  5\' 5"  (1.651 m)    Weight:  55.58 kg (122 lb 8.5 oz)    SpO2: 98% 96% 97% 100%    Intake/Output Summary (Last 24 hours) at 01/19/13 1250 Last data filed at 01/19/13 9562  Gross per 24 hour  Intake 1312.33 ml  Output      0 ml  Net 1312.33 ml   Filed Weights   01/15/13 0500 01/16/13 0500 01/18/13 2128  Weight: 57.6 kg (126 lb 15.8 oz) 55.9 kg (123 lb 3.8 oz) 55.58 kg (122 lb 8.5 oz)    Exam: General: Alert and awake, oriented x3, not in any acute distress. HEENT: anicteric sclera, pupils reactive to light and accommodation, EOMI CVS: S1-S2 clear, no murmur rubs or gallops Chest: clear to auscultation bilaterally, no wheezing, rales or rhonchi Abdomen: soft nontender, nondistended, normal bowel sounds, no organomegaly Extremities: no cyanosis, clubbing or edema noted bilaterally Neuro: Cranial nerves II-XII intact, no focal neurological deficits  Data Reviewed: Basic Metabolic Panel:  Recent Labs Lab 01/13/13 0500 01/14/13 0430 01/15/13 0425 01/15/13 1818 01/16/13 0310 01/17/13 0518 01/18/13 0545 01/19/13 0405  NA 136 142 143  --  147* 137 138 138  K 3.3* 3.9 3.8 4.3 3.7 3.4* 3.5 3.4*  CL 100 105 101  --  103 97 101 98  CO2 29 29 32  --  32 28 28 30   GLUCOSE 140* 124* 190*  --  209* 134* 105* 138*  BUN 16 23 38*  --  63* 50* 24* 19  CREATININE 0.62 0.70 0.78  --  0.97 0.90 0.79 0.81  CALCIUM 7.9* 8.7  9.7  --  9.7 9.4 9.3 9.7  MG  --  2.0 2.1  --  2.5 2.0  --   --   PHOS  --  2.5 2.9  --  3.8 4.6  --   --    Liver Function Tests: No results found for this basename: AST, ALT, ALKPHOS, BILITOT, PROT, ALBUMIN,  in the last 168 hours No results found for this basename: LIPASE, AMYLASE,  in the last 168 hours No results found for this basename: AMMONIA,  in the last 168 hours CBC:  Recent Labs Lab 01/13/13 0500 01/14/13 0430  01/15/13 0425 01/16/13 0310 01/17/13 0518 01/19/13 0405  WBC 13.3* 14.3* 14.3* 18.9* 18.9* 19.9*  NEUTROABS 11.2*  --   --   --   --   --   HGB 8.0* 7.6* 8.2* 8.7* 8.3* 9.6*  HCT 24.0* 22.4* 23.7* 24.9* 22.9* 28.4*  MCV 81.6 81.8 79.8 79.6 78.2 79.8  PLT 206 231 300 373 376 435*   Cardiac Enzymes: No results found for this basename: CKTOTAL, CKMB, CKMBINDEX, TROPONINI,  in the last 168 hours BNP (last 3 results)  Recent Labs  01/08/13 0217  PROBNP 247.0*   CBG:  Recent Labs Lab 01/18/13 1918 01/19/13 0025 01/19/13 0441 01/19/13 0735 01/19/13 1244  GLUCAP 130* 120* 125* 95 137*    Recent Results (from the past 240 hour(s))  CULTURE, BLOOD (ROUTINE X 2)     Status: None   Collection Time    01/11/13  3:27 AM      Result Value Range Status   Specimen Description BLOOD RIGHT ANTECUBITAL   Final   Special Requests BOTTLES DRAWN AEROBIC AND ANAEROBIC 6CC   Final   Culture NO GROWTH 5 DAYS   Final   Report Status 01/16/2013 FINAL   Final  CULTURE, BLOOD (ROUTINE X 2)     Status: None   Collection Time    01/11/13  3:52 AM      Result Value Range Status   Specimen Description BLOOD RIGHT HAND   Final   Special Requests BOTTLES DRAWN AEROBIC AND ANAEROBIC 6CC   Final   Culture NO GROWTH 5 DAYS   Final   Report Status 01/16/2013 FINAL   Final  MRSA PCR SCREENING     Status: None   Collection Time    01/11/13 11:00 AM      Result Value Range Status   MRSA by PCR NEGATIVE  NEGATIVE Final   Comment:            The GeneXpert MRSA Assay (FDA     approved for NASAL specimens     only), is one component of a     comprehensive MRSA colonization     surveillance program. It is not     intended to diagnose MRSA     infection nor to guide or     monitor treatment for     MRSA infections.  PNEUMOCYSTIS JIROVECI SMEAR BY DFA     Status: None   Collection Time    01/11/13  1:15 PM      Result Value Range Status   Specimen Source-PJSRC ENDOTRACHEAL   Final   Comment:  ASPIRATE   Pneumocystis jiroveci Ag NEGATIVE   Final   Comment: Performed at [facility]  CULTURE, RESPIRATORY (NON-EXPECTORATED)     Status: None   Collection Time    01/11/13  1:15 PM      Result Value Range  Status   Specimen Description BRONCHIAL WASHINGS PER AMEH S.,RN 01/11/13 1524   Final   Special Requests NONE   Final   Gram Stain     Final   Value: ABUNDANT WBC PRESENT,BOTH PMN AND MONONUCLEAR     NO SQUAMOUS EPITHELIAL CELLS SEEN     NO ORGANISMS SEEN   Culture Non-Pathogenic Oropharyngeal-type Flora Isolated.   Final   Report Status 01/14/2013 FINAL   Final  CLOSTRIDIUM DIFFICILE BY PCR     Status: None   Collection Time    01/15/13  6:32 PM      Result Value Range Status   C difficile by pcr NEGATIVE  NEGATIVE Final     Studies: Dg Chest 2 View  01/19/2013   *RADIOLOGY REPORT*  Clinical Data: Short of breath.  Headache.  CHEST - 2 VIEW  Comparison: 01/17/2013 the  Findings: Right-sided center venous catheter has been removed. There is been resolution of previously seen opacity in the left lung base.  Persistent interstitial prominence noted, but no residual airspace disease or other acute infiltrate.  No evidence of pleural effusion.  Mild cardiomegaly stable.  IMPRESSION: Resolution of left lower lobe infiltrate.  Stable mild cardiomegaly.  No acute findings.   Original Report Authenticated By: Myles Rosenthal, M.D.    Scheduled Meds: . enoxaparin (LOVENOX) injection  40 mg Subcutaneous Q24H  . furosemide  40 mg Intravenous Q8H  . guaiFENesin  1,200 mg Oral BID  . insulin aspart  0-15 Units Subcutaneous Q4H  . predniSONE  40 mg Oral Q breakfast   Followed by  . [START ON 01/23/2013] predniSONE  30 mg Oral Q breakfast   Followed by  . [START ON 01/28/2013] predniSONE  20 mg Oral Q breakfast   Followed by  . [START ON 02/02/2013] predniSONE  10 mg Oral Q breakfast   Followed by  . [START ON 02/07/2013] predniSONE  5 mg Oral Q breakfast  . simvastatin  20 mg  Oral QHS   Continuous Infusions: . sodium chloride 500 mL (01/15/13 1203)    Principal Problem:   Acute respiratory failure with hypoxia Active Problems:   HYPERLIPIDEMIA   MIGRAINE HEADACHE   HYPERTENSION   BRONCHITIS, CHRONIC   CAP (community acquired pneumonia)   Leukocytosis, unspecified   Sinus tachycardia    Time spent: 35 minutes    [facility] A  [facility] Pager [phone]. If 7PM-7AM, please contact night-coverage at [email or web address], password [email or web address] 01/19/2013, 12:50 PM  LOS: 11 days

## 2013-01-20 MED ORDER — FUROSEMIDE 40 MG PO TABS: 40.0000 mg | ORAL_TABLET | Freq: Every day | ORAL | Status: DC

## 2013-01-20 MED ORDER — GUAIFENESIN ER 600 MG PO TB12: 1200.0000 mg | ORAL_TABLET | Freq: Two times a day (BID) | ORAL | Status: DC

## 2013-01-20 MED ORDER — POTASSIUM CHLORIDE CRYS ER 20 MEQ PO TBCR
40.0000 meq | EXTENDED_RELEASE_TABLET | Freq: Once | ORAL | Status: AC
  Administered 2013-01-20: 40 meq via ORAL
  Filled 2013-01-20: qty 2

## 2013-01-20 MED ORDER — ALBUTEROL SULFATE (5 MG/ML) 0.5% IN NEBU: 2.5000 mg | INHALATION_SOLUTION | RESPIRATORY_TRACT | Status: DC | PRN

## 2013-01-20 MED ORDER — POTASSIUM CHLORIDE ER 10 MEQ PO TBCR: 20.0000 meq | EXTENDED_RELEASE_TABLET | Freq: Every day | ORAL | Status: DC

## 2013-01-20 MED ORDER — ALBUTEROL SULFATE HFA 108 (90 BASE) MCG/ACT IN AERS: 2.0000 | INHALATION_SPRAY | Freq: Four times a day (QID) | RESPIRATORY_TRACT | Status: DC | PRN

## 2013-01-20 MED ORDER — PREDNISONE 10 MG PO TABS: 40.0000 mg | ORAL_TABLET | Freq: Every day | ORAL | Status: DC

## 2013-01-20 NOTE — Progress Notes (Signed)
Physical Therapy Treatment Patient Details Name: Cynthia [name] MRN: [id number] DOB: 1950/11/02 Today's Date: 01/20/2013 Time: 4098-1191 PT Time Calculation (min): 31 min  PT Assessment / Plan / Recommendation  PT Comments   Pt with continued progression with gait and activity tolerance. Pt able to maintain sats 89-94% throughout on RA with only brief drops below 90 and able to recover with cueing for pursed lip breathing. Pt able to regulate gait distance and need for rest breaks and increased pursed lip breathing to maintain sats by end of session.  Pt encouraged to continue mobility, energy conservation and breathing techniques.  Follow Up Recommendations        Does the patient have the potential to tolerate intense rehabilitation     Barriers to Discharge        Equipment Recommendations       Recommendations for Other Services    Frequency     Progress towards PT Goals Progress towards PT goals: Goals met and updated - see care plan  Plan Current plan remains appropriate    Precautions / Restrictions Precautions Precautions: None   Pertinent Vitals/Pain No pain sats 92% at rest on RA 89-94 with gait on RA 94 end of session    Mobility  Bed Mobility Bed Mobility: Supine to Sit;Sitting - Scoot to Edge of Bed Supine to Sit: 6: Modified independent (Device/Increase time);HOB flat Sitting - Scoot to Edge of Bed: 6: Modified independent (Device/Increase time) Transfers Sit to Stand: 6: Modified independent (Device/Increase time);From bed;From toilet Stand to Sit: 6: Modified independent (Device/Increase time);To toilet;To chair/3-in-1 Ambulation/Gait Ambulation/Gait Assistance: 5: Supervision Ambulation Distance (Feet): 300 Feet Assistive device: Rolling walker;None Ambulation/Gait Assistance Details: last 90' no AD with decreased gait speed and slight unsteadiness but no need for assist, cueing for breathing technique throughout gait to maintain sats with 5 standing  rest breaks throughout Gait Pattern: Step-through pattern;Decreased stride length Gait velocity: decreased Stairs: Yes Stairs Assistance: 6: Modified independent (Device/Increase time) Stair Management Technique: One rail Right;Alternating pattern;Forwards Number of Stairs: 5    Exercises     PT Diagnosis:    PT Problem List:   PT Treatment Interventions:     PT Goals (current goals can now be found in the care plan section)    Visit Information  Last PT Received On: 01/20/13 Assistance Needed: +1 History of Present Illness: Pt with acute respiratory failure from [facility] with VDRF 7/5-7/10, chronic bronchitis    Subjective Data      Cognition  Cognition Arousal/Alertness: Awake/alert Behavior During Therapy: WFL for tasks assessed/performed Overall Cognitive Status: Within Functional Limits for tasks assessed    Balance     End of Session PT - End of Session Equipment Utilized During Treatment: Gait belt Activity Tolerance: Patient tolerated treatment well Patient left: in chair;with call bell/phone within reach   GP     Delorse Lek 01/20/2013, 1:01 PM Delaney Meigs, PT [phone]

## 2013-01-20 NOTE — Discharge Summary (Signed)
Addendum  Patient seen and examined, chart and data base reviewed.  I agree with the above assessment and discharge plan.  For full details please see Mrs. Algis Downs PA note.  VDRF secondary to PNA and acute diastolic CHF.  Patient is back to her baseline, followup with primary care physician.   Clint Lipps, MD [facility] Pager: [phone] 01/20/2013, 2:38 PM

## 2013-01-20 NOTE — Progress Notes (Signed)
Pt. Also given heart failure packet and review information.  Husband & pt. Was able to recall information.  Forbes Cellar, RN

## 2013-01-20 NOTE — Progress Notes (Signed)
Pt. Was ordered to get nebs at home PRN, but didn't have neb. Machine at home.  Paperwork [facility]ed to [facility] and to be delivered to pt. Home.  Informed family, pt. Escorted to car via W/C by NT.

## 2013-01-20 NOTE — Progress Notes (Signed)
Sandria Manly [facility] discharged Home per MD order.  Discharge instructions reviewed and discussed with the patient & husband Cleopatra Cedar, all questions and concerns answered. Copy of instructions and scripts given to patient.    Medication List    STOP taking these medications       lisinopril-hydrochlorothiazide 10-12.5 MG per tablet  Commonly known as:  PRINZIDE,ZESTORETIC      TAKE these medications       albuterol 108 (90 BASE) MCG/ACT inhaler  Commonly known as:  PROVENTIL HFA;VENTOLIN HFA  Inhale 2 puffs into the lungs every 6 (six) hours as needed for wheezing.     albuterol (5 MG/ML) 0.5% nebulizer solution  Commonly known as:  PROVENTIL  Take 0.5 mLs (2.5 mg total) by nebulization every 3 (three) hours as needed for wheezing or shortness of breath.     butalbital-acetaminophen-caffeine 50-325-40-30 MG per capsule  Commonly known as:  FIORICET WITH CODEINE  Take 1 capsule by mouth every 6 (six) hours as needed for migraine.     FERROUS SULFATE PO  Take 1 tablet by mouth daily.     furosemide 40 MG tablet  Commonly known as:  LASIX  Take 1 tablet (40 mg total) by mouth daily.     guaiFENesin 600 MG 12 hr tablet  Commonly known as:  MUCINEX  Take 2 tablets (1,200 mg total) by mouth 2 (two) times daily.     potassium chloride 10 MEQ tablet  Commonly known as:  K-DUR  Take 2 tablets (20 mEq total) by mouth daily.     predniSONE 10 MG tablet  Commonly known as:  DELTASONE  - Take 4 tablets (40 mg total) by mouth daily with breakfast. Take 4 tablets with breakfast for 3 days,  - then take 3 tablets with breakfast for 3 days,   - then take 2 tablets for 3 days,   - then take 1 tablet for 3 days,   - then take 1/2 tablet each day for 4 days.     simvastatin 20 MG tablet  Commonly known as:  ZOCOR  Take 20 mg by mouth at bedtime.        Patients skin is clean, dry and intact, no evidence of skin break down. IV site discontinued and catheter remains intact.  Site without signs and symptoms of complications. Dressing and pressure applied.  Patient escorted to car by NT in a wheelchair,  no distress noted upon discharge.  Laural Benes, Yulianna Folse C 01/20/2013 5:09 PM

## 2013-01-20 NOTE — Discharge Summary (Signed)
Physician Discharge Summary  RACHELE LAMASTER ZOX:[id number] DOB: [birth year] DOA: 01/08/2013  PCP: Evlyn Courier, MD  Admit date: 01/08/2013 Discharge date: 01/20/2013  Time spent: 60 minutes  Recommendations for Outpatient Follow-up:  Patient had atypical pneumonia.  She will need a follow up xray in 4 weeks to verify resolution. Please have a low threshold for pulmonology referral if there is any question regarding resolution of pneumonia.  Patient's blood pressure has been on the low normal side.  Her previous BP medications (lisinopril and HCTZ) have been discontinued until she sees her primary care physician who can reassess her BP status.  She will continue on lasix 40 mg daily until adjusted by her PCP.  Discharge Diagnoses:  Principal Problem:   Acute respiratory failure with hypoxia Active Problems:   HYPERLIPIDEMIA   MIGRAINE HEADACHE   HYPERTENSION   BRONCHITIS, CHRONIC   CAP (community acquired pneumonia)   Leukocytosis, unspecified   Sinus tachycardia   Discharge Condition: much improved.  Completed antibiotics, able to ambulate short distances without oxygen.  Diet recommendation: Heart Healthy  Filed Weights   01/15/13 0500 01/16/13 0500 01/18/13 2128  Weight: 57.6 kg (126 lb 15.8 oz) 55.9 kg (123 lb 3.8 oz) 55.58 kg (122 lb 8.5 oz)    History of present illness at the time of admission to [facility]: Lisett Dirusso Lorette Ang is a 62 y.o. female with past medical history of hypertension, bronchitis, and migraine who presents to the emergency room with a chief complaint of cough and shortness of breath. Information is obtained from the patient. She reports that 2 days ago she developed some sinus congestion. One day ago she developed cough nonproductive as well as wheezing with shortness of breath on exertion. She denies any chest pain palpitation syncope near syncope. She states that she used her home inhaler without relief. Associated symptoms include subjective  fever with chills general malaise and decreased appetite.  Lab work in the emergency room is significant for a white count of 15.3 and proBNP 247. Chest x-ray yields diffuse interstitial pattern suggesting atypical infections/mycoplasma.  Oxygen saturation level 97% on 2 L of oxygen however when ambulated in the emergency room on room air her oxygen saturation level dropped to 88%.  She was admitted by the Hospitalists and placed on broad spectrum antibiotics.  Hospital Course:  Acute respiratory failure  -Unfortunately the patient declined and had to be transferred to [facility] service at [facility] on 7/5 due to respiratory failure -Ventilator dependent respiratory failure, patient spent 5 days in the ventilator.  -This is secondary to Pneumonia and diastolic CHF exacerbation.  -serial chest xrays have demonstrated improvement in bilateral pneumonia.  -Oxygen was weaned.  The patient is able to ambulate and maintain saturations of 88% and above.  Pneumonia  -Healthcare associated pneumonia, patient was treated per HCAP protocol with broad spectrum antibiotics as well as steroids, nebulizers, and mucolytics -Strep Pneumo and Legionella Antigen were negative. -Blood cultures from 7/5 are negative. -Antibiotic treatment was completed on 7/13. -Patient will be discharged on a prednisone taper, albuterol inhaler, and PRN nebs. -There was question about autoimmune pneumonitis as her Rheumatoid Factor was elevated, patient followup as outpatient.   Acute encephalopathy  -This is secondary to acute respiratory failure, this is resolved. Patient currently awake and oriented.   Acute diastolic CHF  -2-D echo showed normal LVEF with grade 1 diastolic dysfunction.  -Patient was started on IV lasix diuresis she had over 4900 mL of negative fluid balance since admission.  -  She will be discharged on 40 mg of lasix daily until reassessed by her PCP.  Hypertension  -The patient was on lisinopril and HCTZ prior  to admission.  These are being held. -She will be discharged on lasix.  Currently her BP readings are low-normal.  We ask the PCP to reassess BP after discharge.  Code Status: Full code    Consultants:  Patient was under CCM when she transferred to [facility], [facility] assumed care on 01/18/2013  Procedures:  Intubation 7/5 - 7/10  Right IJ placed 7/5  Arterial Catheter placed in the left radial artery 7/7  Bronchoscopy 7/5  Discharge Exam: Filed Vitals:   01/19/13 1457 01/19/13 2103 01/20/13 0607 01/20/13 1200  BP: 119/75 111/69 99/64   Pulse: 92 55 100   Temp: 98.7 F (37.1 C) 98.8 F (37.1 C) 99.2 F (37.3 C)   TempSrc: Oral Oral Oral   Resp: 18 18 17    Height:      Weight:      SpO2: 99% 98% 97% 93%    General: A&O, NAD, Appears well. Cardiovascular: RRR no m/r/g no lower extremity swelling. Respiratory: CTA no w/c/r, no accessory muscle use Abdomen:  Soft, NT, ND, +BS, no masses Extremities: no clubbing, cyanosis, or edema, patient able to ambulate.  Discharge Instructions      Discharge Orders   Future Orders Complete By Expires     Diet - low sodium heart healthy  As directed     Increase activity slowly  As directed         Medication List    STOP taking these medications       lisinopril-hydrochlorothiazide 10-12.5 MG per tablet  Commonly known as:  PRINZIDE,ZESTORETIC      TAKE these medications       albuterol 108 (90 BASE) MCG/ACT inhaler  Commonly known as:  PROVENTIL HFA;VENTOLIN HFA  Inhale 2 puffs into the lungs every 6 (six) hours as needed for wheezing.     albuterol (5 MG/ML) 0.5% nebulizer solution  Commonly known as:  PROVENTIL  Take 0.5 mLs (2.5 mg total) by nebulization every 3 (three) hours as needed for wheezing or shortness of breath.     butalbital-acetaminophen-caffeine 50-325-40-30 MG per capsule  Commonly known as:  FIORICET WITH CODEINE  Take 1 capsule by mouth every 6 (six) hours as needed for  migraine.     FERROUS SULFATE PO  Take 1 tablet by mouth daily.     furosemide 40 MG tablet  Commonly known as:  LASIX  Take 1 tablet (40 mg total) by mouth daily.     guaiFENesin 600 MG 12 hr tablet  Commonly known as:  MUCINEX  Take 2 tablets (1,200 mg total) by mouth 2 (two) times daily.     potassium chloride 10 MEQ tablet  Commonly known as:  K-DUR  Take 2 tablets (20 mEq total) by mouth daily.     predniSONE 10 MG tablet  Commonly known as:  DELTASONE  - Take 4 tablets (40 mg total) by mouth daily with breakfast. Take 4 tablets with breakfast for 3 days,  - then take 3 tablets with breakfast for 3 days,   - then take 2 tablets for 3 days,   - then take 1 tablet for 3 days,   - then take 1/2 tablet each day for 4 days.     simvastatin 20 MG tablet  Commonly known as:  ZOCOR  Take 20 mg by mouth at  bedtime.       Allergies  Allergen Reactions  . Aspirin     REACTION: Stomach upset      The results of significant diagnostics from this hospitalization (including imaging, microbiology, ancillary and laboratory) are listed below for reference.    Significant Diagnostic Studies: Dg Chest 2 View  01/19/2013   *RADIOLOGY REPORT*  Clinical Data: Short of breath.  Headache.  CHEST - 2 VIEW  Comparison: 01/17/2013 the  Findings: Right-sided center venous catheter has been removed. There is been resolution of previously seen opacity in the left lung base.  Persistent interstitial prominence noted, but no residual airspace disease or other acute infiltrate.  No evidence of pleural effusion.  Mild cardiomegaly stable.  IMPRESSION: Resolution of left lower lobe infiltrate.  Stable mild cardiomegaly.  No acute findings.   Original Report Authenticated By: Myles Rosenthal, M.D.   Dg Chest 2 View  01/10/2013   *RADIOLOGY REPORT*  Clinical Data: Shortness of breath.  Weakness.  Pneumonia.  CHEST - 2 VIEW  Comparison: 01/08/2013  Findings: There has been marked progression of the  bilateral pneumonia.  This is particularly severe in the upper lung zones. There are new tiny bilateral pleural effusions.  Heart size and vascularity are normal considering the shallow inspiration.  No acute osseous abnormality.  Slight thoracic scoliosis.  IMPRESSION: Marked progression of bilateral pneumonia.  New tiny effusions.   Original Report Authenticated By: Francene Boyers, M.D.   Dg Chest 2 View  01/08/2013   *RADIOLOGY REPORT*  Clinical Data: Cough.  Nasal congestion.  Wheezing.  CHEST - 2 VIEW  Comparison: 05/30/2011.  Findings: Cardiopericardial silhouette upper limits of normal for projection.  There is new diffuse interstitial prominence present with right basilar distribution.  The findings suggest interstitial pulmonary edema.  In a patient with fever and chills, atypical infection such as mycoplasma can also produce this appearance. There is no focal consolidation.  IMPRESSION: Diffuse interstitial pattern, suggesting atypical infections/mycoplasma in this patient with infectious symptoms.   Original Report Authenticated By: Andreas Newport, M.D.   Ct Chest Wo Contrast  01/11/2013   *RADIOLOGY REPORT*  Clinical Data: Pneumonia.  Respiratory failure.  CT CHEST WITHOUT CONTRAST  Technique:  Multidetector CT imaging of the chest was performed following the standard protocol without IV contrast.  Comparison: 01/11/2013 radiographs  Findings: Endotracheal tube satisfactorily positioned.  Nasogastric tube extends into the stomach.  Right internal jugular central venous catheter tip in the lower SVC.  Atherosclerotic calcification of the aortic arch and coronary arteries noted.  Trace bilateral pleural effusions noted.  Upper normal sized AP window lymph nodes observed.  Bilateral secondary pulmonary lobular septal thickening noted with hazy airspace opacities scattered in both lungs with favoring of the lung apices.  The processes diffuse in the upper to thirds of the lungs with relative sparing of  the lung bases, and only patchy ground-glass opacities in the lung bases.  IMPRESSION:  1.  Airspace opacities and secondary pulmonary lobular septal thickening with differential diagnostic considerations including cardiogenic pulmonary edema, interstitial pneumonia (viral, mycoplasma), ARDS, or less likely pulmonary hemorrhage.   Original Report Authenticated By: Gaylyn Rong, M.D.   Dg Chest Port 1 View  01/17/2013   *RADIOLOGY REPORT*  Clinical Data: Endotracheal tube replacement.  PORTABLE CHEST - 1 VIEW  Comparison: One-view chest 01/16/2013.  Findings: The patient has been extubated.  A right IJ line is stable.  The NG tube was removed as well.  Asymmetric left-sided interstitial and airspace disease persists.  More mild disease on the right is stable.  The visualized soft tissues and bony thorax are unremarkable.  IMPRESSION:  1.  Status post extubation. 2.  Persistent asymmetric left-sided interstitial and airspace disease.  This likely represents edema.   Original Report Authenticated By: Marin Roberts, M.D.   Dg Chest Port 1 View  01/16/2013   *RADIOLOGY REPORT*  Clinical Data: Pneumonia, endotracheal tube  PORTABLE CHEST - 1 VIEW  Comparison: Prior chest x-ray 01/15/2013  Findings: Endotracheal tube is 3.6 cm above the carina.  Right IJ central venous catheter in unchanged position with the tip at the cavoatrial junction.  The tip of the nasogastric tube remains high and is positioned in the distal thoracic esophagus.  Mild cardiomegaly similar to prior.  Asymmetric left greater than right patchy basilar opacities appears similar to slightly improved compared to prior.  No pneumothorax.  No acute osseous abnormality.  IMPRESSION:  1.  The tip of the nasogastric tube is in the distal thoracic esophagus.  If the intent is to decompress the stomach, recommend advancing 10 cm. 2.  Other support apparatus in stable and satisfactory position. 3.  Similar appearance of asymmetric left greater  than right patchy interstitial and airspace opacities.   Original Report Authenticated By: Malachy Moan, M.D.   Dg Chest Port 1 View  01/15/2013   *RADIOLOGY REPORT*  Clinical Data: Endotracheal tube placement, shortness of breath  PORTABLE CHEST - 1 VIEW  Comparison: Portable chest x-ray of 01/14/2013  Findings: The lungs appear better aerated with some improvement in diffuse airspace disease.  Endotracheal tube tip approximately 1.7 cm above the carina.  Right IJ central venous line tip overlies the lower SVC.  Mild cardiomegaly is stable.  IMPRESSION: Slightly improved aeration with some improvement in airspace disease.   Original Report Authenticated By: Dwyane Dee, M.D.   Dg Chest Port 1 View  01/14/2013   *RADIOLOGY REPORT*  Clinical Data: Assess infiltrates and endotracheal tube.  PORTABLE CHEST - 1 VIEW  Comparison: 01/13/2013  Findings: Endotracheal tube is 2.6 cm above the carina.  Right jugular central line in the cavoatrial junction region.  There is mild improvement of the bilateral airspace disease.  Heart size is stable.  No evidence for a pneumothorax.  Tip of nasogastric tube is now in the lower esophagus.  IMPRESSION: Decreasing bilateral airspace disease.  Nasogastric tube in the lower esophagus region.  This finding was called to the patient's nurse on 01/14/2013 at 7:28 a.m.   Original Report Authenticated By: Richarda Overlie, M.D.   Dg Chest Port 1 View  01/13/2013   *RADIOLOGY REPORT*  Clinical Data: Bilateral airspace opacities in the lungs. Endotracheal tube assessment.  PORTABLE CHEST - 1 VIEW  Comparison: 01/12/2013  Findings: Endotracheal tube observed with tip 1.5 cm above the carina.  Right IJ line tip:  Cavoatrial junction.  Nasogastric tube tip is in the stomach body.  Worsening bilateral indistinct airspace opacities favor the perihilar regions.  Mild obscuration of cardiac borders, with suspected upper normal heart size.  Mildly low lung volumes.  IMPRESSION:  1.  Worsened  bilateral airspace opacities, query bilateral pneumonia or edema. 2.  Endotracheal tube tip 1.5 cm above the carina, consider retracting 1 cm.   Original Report Authenticated By: Gaylyn Rong, M.D.   Dg Chest Port 1 View  01/12/2013   *RADIOLOGY REPORT*  Clinical Data: Bibasilar infiltrates.  ET tube placement.  PORTABLE CHEST - 1 VIEW  Comparison: 01/11/2013  Findings: The endotracheal tube tip is at  the level of the carina and is directed towards the right mainstem bronchus.  There is a right IJ catheter with tip in the projection of the cavoatrial junction.  Normal heart size.  No change in bilateral airspace opacities.  IMPRESSION:  1.  ET tube tip is directed towards the right mainstem bronchus and may need to be withdrawn by 1-2cm. 2.  No change in bilateral airspace opacities.   Original Report Authenticated By: Signa Kell, M.D.   Dg Chest Port 1 View  01/11/2013   *RADIOLOGY REPORT*  Clinical Data: Endotracheal tube placement.  Central line placement.  Pneumonia.  PORTABLE CHEST - 1 VIEW  Comparison: 01/11/2013 at to 6:45 a.m.  Findings: The patient is rotated to the right on today's exam, resulting in reduced diagnostic sensitivity and specificity. Endotracheal tube tip is 2.4 cm above the carina.  Right IJ line tip:  Lower SVC.  No pneumothorax observed.  Stable bilateral interstitial and airspace opacities compatible with bilateral pneumonia or pulmonary edema.  IMPRESSION:  1.  Endotracheal tube tip satisfactorily positioned 2.4 cm above carina.  Right IJ line tip:  Lower SVC.  No pneumothorax. 2.  Stable bilateral perihilar interstitial and airspace opacities.   Original Report Authenticated By: Gaylyn Rong, M.D.   Dg Chest Port 1v Same Day  01/11/2013   *RADIOLOGY REPORT*  Clinical Data: Dyspnea  PORTABLE CHEST - 1 VIEW SAME DAY  Comparison: 01/10/2013  Findings: Normal heart size.  There is no pleural effusion identified.  Bilateral upper lobe airspace opacities are not changed  from previous exam.  IMPRESSION:  1.  No change in bilateral upper lobe airspace opacities.   Original Report Authenticated By: Signa Kell, M.D.    Microbiology: Recent Results (from the past 240 hour(s))  CULTURE, BLOOD (ROUTINE X 2)     Status: None   Collection Time    01/11/13  3:27 AM      Result Value Range Status   Specimen Description BLOOD RIGHT ANTECUBITAL   Final   Special Requests BOTTLES DRAWN AEROBIC AND ANAEROBIC 6CC   Final   Culture NO GROWTH 5 DAYS   Final   Report Status 01/16/2013 FINAL   Final  CULTURE, BLOOD (ROUTINE X 2)     Status: None   Collection Time    01/11/13  3:52 AM      Result Value Range Status   Specimen Description BLOOD RIGHT HAND   Final   Special Requests BOTTLES DRAWN AEROBIC AND ANAEROBIC 6CC   Final   Culture NO GROWTH 5 DAYS   Final   Report Status 01/16/2013 FINAL   Final  MRSA PCR SCREENING     Status: None   Collection Time    01/11/13 11:00 AM      Result Value Range Status   MRSA by PCR NEGATIVE  NEGATIVE Final   Comment:            The GeneXpert MRSA Assay (FDA     approved for NASAL specimens     only), is one component of a     comprehensive MRSA colonization     surveillance program. It is not     intended to diagnose MRSA     infection nor to guide or     monitor treatment for     MRSA infections.  PNEUMOCYSTIS JIROVECI SMEAR BY DFA     Status: None   Collection Time    01/11/13  1:15 PM      Result  Value Range Status   Specimen Source-PJSRC ENDOTRACHEAL   Final   Comment: ASPIRATE   Pneumocystis jiroveci Ag NEGATIVE   Final   Comment: Performed at [facility]  CULTURE, RESPIRATORY (NON-EXPECTORATED)     Status: None   Collection Time    01/11/13  1:15 PM      Result Value Range Status   Specimen Description BRONCHIAL WASHINGS PER AMEH S.,RN 01/11/13 1524   Final   Special Requests NONE   Final   Gram Stain     Final   Value: ABUNDANT WBC PRESENT,BOTH PMN AND MONONUCLEAR     NO SQUAMOUS  EPITHELIAL CELLS SEEN     NO ORGANISMS SEEN   Culture Non-Pathogenic Oropharyngeal-type Flora Isolated.   Final   Report Status 01/14/2013 FINAL   Final  CLOSTRIDIUM DIFFICILE BY PCR     Status: None   Collection Time    01/15/13  6:32 PM      Result Value Range Status   C difficile by pcr NEGATIVE  NEGATIVE Final     Labs: Basic Metabolic Panel:  Recent Labs Lab 01/14/13 0430 01/15/13 0425 01/15/13 1818 01/16/13 0310 01/17/13 0518 01/18/13 0545 01/19/13 0405  NA 142 143  --  147* 137 138 138  K 3.9 3.8 4.3 3.7 3.4* 3.5 3.4*  CL 105 101  --  103 97 101 98  CO2 29 32  --  32 28 28 30   GLUCOSE 124* 190*  --  209* 134* 105* 138*  BUN 23 38*  --  63* 50* 24* 19  CREATININE 0.70 0.78  --  0.97 0.90 0.79 0.81  CALCIUM 8.7 9.7  --  9.7 9.4 9.3 9.7  MG 2.0 2.1  --  2.5 2.0  --   --   PHOS 2.5 2.9  --  3.8 4.6  --   --    CBC:  Recent Labs Lab 01/14/13 0430 01/15/13 0425 01/16/13 0310 01/17/13 0518 01/19/13 0405  WBC 14.3* 14.3* 18.9* 18.9* 19.9*  HGB 7.6* 8.2* 8.7* 8.3* 9.6*  HCT 22.4* 23.7* 24.9* 22.9* 28.4*  MCV 81.8 79.8 79.6 78.2 79.8  PLT 231 300 373 376 435*   BNP: BNP (last 3 results)  Recent Labs  01/08/13 0217  PROBNP 247.0*   CBG:  Recent Labs Lab 01/19/13 1244 01/19/13 1647 01/19/13 2101 01/20/13 0759 01/20/13 1132  GLUCAP 137* 150* 138* 121* 112*       Signed:  Conley Canal [phone]  [facility] 01/20/2013, 1:12 PM

## 2013-02-10 ENCOUNTER — Other Ambulatory Visit (HOSPITAL_COMMUNITY): Payer: Self-pay | Admitting: Physician Assistant

## 2013-02-10 DIAGNOSIS — Z139 Encounter for screening, unspecified: Secondary | ICD-10-CM

## 2013-02-24 ENCOUNTER — Ambulatory Visit (HOSPITAL_COMMUNITY)
Admission: RE | Admit: 2013-02-24 | Discharge: 2013-02-24 | Disposition: A | Discharge status: Home / Self Care | Payer: Self-pay | Source: Ambulatory Visit | Attending: Physician Assistant | Admitting: Physician Assistant

## 2013-02-24 DIAGNOSIS — Z139 Encounter for screening, unspecified: Secondary | ICD-10-CM

## 2013-06-19 LAB — HM PAP SMEAR

## 2013-07-08 ENCOUNTER — Emergency Department (HOSPITAL_COMMUNITY)
Admission: EM | Admit: 2013-07-08 | Discharge: 2013-07-08 | Disposition: A | Discharge status: Home / Self Care | Payer: Medicaid Other | Attending: Emergency Medicine | Admitting: Emergency Medicine

## 2013-07-08 ENCOUNTER — Encounter (HOSPITAL_COMMUNITY): Payer: Self-pay | Admitting: Emergency Medicine

## 2013-07-08 ENCOUNTER — Emergency Department (HOSPITAL_COMMUNITY): Payer: Medicaid Other

## 2013-07-08 DIAGNOSIS — E7800 Pure hypercholesterolemia, unspecified: Secondary | ICD-10-CM | POA: Insufficient documentation

## 2013-07-08 DIAGNOSIS — G43909 Migraine, unspecified, not intractable, without status migrainosus: Secondary | ICD-10-CM | POA: Insufficient documentation

## 2013-07-08 DIAGNOSIS — I10 Essential (primary) hypertension: Secondary | ICD-10-CM | POA: Insufficient documentation

## 2013-07-08 DIAGNOSIS — J40 Bronchitis, not specified as acute or chronic: Secondary | ICD-10-CM

## 2013-07-08 DIAGNOSIS — J209 Acute bronchitis, unspecified: Secondary | ICD-10-CM | POA: Insufficient documentation

## 2013-07-08 DIAGNOSIS — F17200 Nicotine dependence, unspecified, uncomplicated: Secondary | ICD-10-CM | POA: Insufficient documentation

## 2013-07-08 DIAGNOSIS — Z79899 Other long term (current) drug therapy: Secondary | ICD-10-CM | POA: Insufficient documentation

## 2013-07-08 MED ORDER — PREDNISONE 20 MG PO TABS
60.0000 mg | ORAL_TABLET | Freq: Once | ORAL | Status: AC
Start: 2013-07-08 — End: 2013-07-08
  Administered 2013-07-08: 60 mg via ORAL
  Filled 2013-07-08: qty 3

## 2013-07-08 MED ORDER — PREDNISONE 10 MG PO TABS: 20.0000 mg | ORAL_TABLET | Freq: Every day | ORAL | Status: DC

## 2013-07-08 MED ORDER — IPRATROPIUM BROMIDE 0.02 % IN SOLN
0.5000 mg | Freq: Once | RESPIRATORY_TRACT | Status: AC
  Administered 2013-07-08: 0.5 mg via RESPIRATORY_TRACT
  Filled 2013-07-08: qty 2.5

## 2013-07-08 MED ORDER — AZITHROMYCIN 250 MG PO TABS
500.0000 mg | ORAL_TABLET | Freq: Once | ORAL | Status: AC
  Administered 2013-07-08: 500 mg via ORAL
  Filled 2013-07-08: qty 2

## 2013-07-08 MED ORDER — AZITHROMYCIN 250 MG PO TABS: 250.0000 mg | ORAL_TABLET | Freq: Every day | ORAL | Status: DC

## 2013-07-08 MED ORDER — ALBUTEROL SULFATE (2.5 MG/3ML) 0.083% IN NEBU
5.0000 mg | INHALATION_SOLUTION | Freq: Once | RESPIRATORY_TRACT | Status: AC
  Administered 2013-07-08: 5 mg via RESPIRATORY_TRACT
  Filled 2013-07-08: qty 6

## 2013-07-08 MED ORDER — IBUPROFEN 800 MG PO TABS
800.0000 mg | ORAL_TABLET | Freq: Once | ORAL | Status: AC
  Administered 2013-07-08: 800 mg via ORAL
  Filled 2013-07-08: qty 1

## 2013-07-08 MED ORDER — ALBUTEROL SULFATE HFA 108 (90 BASE) MCG/ACT IN AERS: 1.0000 | INHALATION_SPRAY | Freq: Four times a day (QID) | RESPIRATORY_TRACT | Status: DC | PRN

## 2013-07-08 NOTE — ED Provider Notes (Signed)
Medical screening examination/treatment/procedure(s) were performed by non-physician practitioner and as supervising physician I was immediately available for consultation/collaboration.   Dione Booze, MD 07/08/13 [phone]

## 2013-07-08 NOTE — ED Provider Notes (Signed)
CSN: [id number]     Arrival date & time 07/08/13  0011 History   First MD Initiated Contact with Patient 07/08/13 0510     Chief Complaint  Patient presents with  . Cough   HPI  History provided by the patient. Patient is a 62 year old female with history of hypertension, hyperlipidemia and COPD who presents with symptoms of worsening cough and shortness of breath. Patient has had congestion and cough for the past 3 days. She reports productive cough of yellow phlegm it is more white now. Denies any hemoptysis. No recent travel. Denies any fever but does report some chills and increased fatigue. No episodes of vomiting or diarrhea. No other aggravating or alleviating factors. No known sick contacts.    Past Medical History  Diagnosis Date  . Migraine   . Hypertension   . Bronchitis   . High blood cholesterol level    Past Surgical History  Procedure Laterality Date  . Left arm     Family History  Problem Relation Age of Onset  . Heart failure Father   . Hypertension Father   . Stroke Father    History  Substance Use Topics  . Smoking status: Current Every Day Smoker -- 0.50 packs/day for 28 years    Types: Cigarettes  . Smokeless tobacco: Never Used  . Alcohol Use: No   OB History   Grav Para Term Preterm Abortions TAB SAB Ect Mult Living   2 1   1           Review of Systems  Constitutional: Positive for chills and fatigue. Negative for fever.  HENT: Positive for congestion.   Respiratory: Positive for cough, shortness of breath and wheezing.   Gastrointestinal: Negative for vomiting and diarrhea.  All other systems reviewed and are negative.    Allergies  Aspirin  Home Medications   Current Outpatient Rx  Name  Route  Sig  Dispense  Refill  . albuterol (PROVENTIL HFA;VENTOLIN HFA) 108 (90 BASE) MCG/ACT inhaler   Inhalation   Inhale 2 puffs into the lungs every 6 (six) hours as needed for wheezing.   1 Inhaler   6   . albuterol (PROVENTIL) (5 MG/ML)  0.5% nebulizer solution   Nebulization   Take 0.5 mLs (2.5 mg total) by nebulization every 3 (three) hours as needed for wheezing or shortness of breath.   20 mL   12   . butalbital-acetaminophen-caffeine (FIORICET WITH CODEINE) 50-325-40-30 MG per capsule   Oral   Take 1 capsule by mouth every 6 (six) hours as needed for migraine.         . furosemide (LASIX) 20 MG tablet   Oral   Take 20 mg by mouth daily.         Marland Kitchen guaiFENesin (MUCINEX) 600 MG 12 hr tablet   Oral   Take 2 tablets (1,200 mg total) by mouth 2 (two) times daily.         . potassium chloride (K-DUR) 10 MEQ tablet   Oral   Take 10 mEq by mouth daily.         . propranolol (INDERAL) 80 MG tablet   Oral   Take 80 mg by mouth 2 (two) times daily.         . rosuvastatin (CRESTOR) 10 MG tablet   Oral   Take 10 mg by mouth daily.          BP 147/74  Pulse 85  Temp(Src) 99.6 F (37.6 C) (Oral)  Resp 20  SpO2 96% Physical Exam  Nursing note and vitals reviewed. Constitutional: She is oriented to person, place, and time. She appears well-developed and well-nourished. No distress.  HENT:  Head: Normocephalic.  Mouth/Throat: Oropharynx is clear and moist.  Cardiovascular: Normal rate and regular rhythm.   Pulmonary/Chest: Effort normal. No respiratory distress. She has wheezes. She has no rales.  Abdominal: Soft. There is no tenderness.  Musculoskeletal: Normal range of motion. She exhibits no edema and no tenderness.  Neurological: She is alert and oriented to person, place, and time.  Skin: Skin is warm and dry. No rash noted.  Psychiatric: She has a normal mood and affect. Her behavior is normal.    ED Course  Procedures   DIAGNOSTIC STUDIES: Oxygen Saturation is 95% on on room air.    COORDINATION OF CARE:  Nursing notes reviewed. Vital signs reviewed. Initial pt interview and examination performed.   5:39 AM-patient seen and evaluated. Patient appears well in no acute distress. She  has frequent coughing but no signs of respiratory distress. Good O2 sats on room air. Normal heart rate.   Chest x-ray reviewed. No signs of pneumonia or other concerning findings. Patient with significant wheezing and symptoms of bronchitis. She is a smoker. We'll plan to start Z-Pak. Breathing treatments ordered.   Imaging Review Dg Chest 2 View  07/08/2013   CLINICAL DATA:  Cough, chest pain, fever, vomiting, shortness of breath for 3 days.  EXAM: CHEST  2 VIEW  COMPARISON:  01/19/2013  FINDINGS: Mild hyperinflation. The heart size and mediastinal contours are within normal limits. Both lungs are clear. Old right rib fractures. No significant change since previous study.  IMPRESSION: No active cardiopulmonary disease.   Electronically Signed   By: Burman Nieves M.D.   On: 07/08/2013 01:29     MDM   1. Bronchitis        Angus Seller, PA-C 07/08/13 [phone]

## 2013-07-08 NOTE — ED Notes (Signed)
Pt. reports persistent productive cough with chest congestion and headache for 3 days .

## 2013-07-08 NOTE — ED Notes (Signed)
Patient stated she felt better and she was leaving at this time.  Patient in NAD at time of departure, out with family member.  Patient did not wait to be d/c from ED.

## 2013-07-08 NOTE — ED Provider Notes (Signed)
  Physical Exam  BP 147/72  Pulse 86  Temp(Src) 99.6 F (37.6 C) (Oral)  Resp 20  SpO2 97%  Physical Exam  ED Course  Procedures  Patient left without her medications or DC paper work.     Carlyle Dolly, PA-C 07/08/13 [phone]

## 2013-07-09 NOTE — ED Provider Notes (Signed)
Medical screening examination/treatment/procedure(s) were performed by non-physician practitioner and as supervising physician I was immediately available for consultation/collaboration.  EKG Interpretation   None         Benny Lennert, MD 07/09/13 [phone]

## 2013-08-11 ENCOUNTER — Emergency Department (HOSPITAL_COMMUNITY): Payer: Medicaid Other

## 2013-08-11 ENCOUNTER — Emergency Department (HOSPITAL_COMMUNITY)
Admission: EM | Admit: 2013-08-11 | Discharge: 2013-08-11 | Disposition: A | Discharge status: Home / Self Care | Payer: Medicaid Other | Attending: Emergency Medicine | Admitting: Emergency Medicine

## 2013-08-11 ENCOUNTER — Encounter (HOSPITAL_COMMUNITY): Payer: Self-pay | Admitting: Emergency Medicine

## 2013-08-11 DIAGNOSIS — I10 Essential (primary) hypertension: Secondary | ICD-10-CM | POA: Insufficient documentation

## 2013-08-11 DIAGNOSIS — W108XXA Fall (on) (from) other stairs and steps, initial encounter: Secondary | ICD-10-CM | POA: Insufficient documentation

## 2013-08-11 DIAGNOSIS — J4489 Other specified chronic obstructive pulmonary disease: Secondary | ICD-10-CM | POA: Insufficient documentation

## 2013-08-11 DIAGNOSIS — W010XXA Fall on same level from slipping, tripping and stumbling without subsequent striking against object, initial encounter: Secondary | ICD-10-CM | POA: Insufficient documentation

## 2013-08-11 DIAGNOSIS — F4310 Post-traumatic stress disorder, unspecified: Secondary | ICD-10-CM | POA: Insufficient documentation

## 2013-08-11 DIAGNOSIS — Y9289 Other specified places as the place of occurrence of the external cause: Secondary | ICD-10-CM | POA: Insufficient documentation

## 2013-08-11 DIAGNOSIS — W1809XA Striking against other object with subsequent fall, initial encounter: Secondary | ICD-10-CM | POA: Insufficient documentation

## 2013-08-11 DIAGNOSIS — W19XXXA Unspecified fall, initial encounter: Secondary | ICD-10-CM

## 2013-08-11 DIAGNOSIS — F17200 Nicotine dependence, unspecified, uncomplicated: Secondary | ICD-10-CM | POA: Insufficient documentation

## 2013-08-11 DIAGNOSIS — J449 Chronic obstructive pulmonary disease, unspecified: Secondary | ICD-10-CM | POA: Insufficient documentation

## 2013-08-11 DIAGNOSIS — E7800 Pure hypercholesterolemia, unspecified: Secondary | ICD-10-CM | POA: Insufficient documentation

## 2013-08-11 DIAGNOSIS — S79919A Unspecified injury of unspecified hip, initial encounter: Secondary | ICD-10-CM | POA: Insufficient documentation

## 2013-08-11 DIAGNOSIS — S79929A Unspecified injury of unspecified thigh, initial encounter: Principal | ICD-10-CM

## 2013-08-11 DIAGNOSIS — G43909 Migraine, unspecified, not intractable, without status migrainosus: Secondary | ICD-10-CM | POA: Insufficient documentation

## 2013-08-11 DIAGNOSIS — IMO0002 Reserved for concepts with insufficient information to code with codable children: Secondary | ICD-10-CM | POA: Insufficient documentation

## 2013-08-11 DIAGNOSIS — Z79899 Other long term (current) drug therapy: Secondary | ICD-10-CM | POA: Insufficient documentation

## 2013-08-11 DIAGNOSIS — Y9389 Activity, other specified: Secondary | ICD-10-CM | POA: Insufficient documentation

## 2013-08-11 HISTORY — DX: Chronic obstructive pulmonary disease, unspecified: J44.9

## 2013-08-11 HISTORY — DX: Post-traumatic stress disorder, unspecified: F43.10

## 2013-08-11 MED ORDER — DIAZEPAM 2 MG PO TABS
2.0000 mg | ORAL_TABLET | Freq: Once | ORAL | Status: AC
  Administered 2013-08-11: 2 mg via ORAL
  Filled 2013-08-11: qty 1

## 2013-08-11 MED ORDER — KETOROLAC TROMETHAMINE 60 MG/2ML IM SOLN
60.0000 mg | Freq: Once | INTRAMUSCULAR | Status: AC
  Administered 2013-08-11: 60 mg via INTRAMUSCULAR
  Filled 2013-08-11: qty 2

## 2013-08-11 MED ORDER — DIAZEPAM 5 MG PO TABS: 5.0000 mg | ORAL_TABLET | Freq: Two times a day (BID) | ORAL | Status: DC

## 2013-08-11 NOTE — Discharge Instructions (Signed)
Lumbosacral Strain Lumbosacral strain is a strain of any of the parts that make up your lumbosacral vertebrae. Your lumbosacral vertebrae are the bones that make up the lower third of your backbone. Your lumbosacral vertebrae are held together by muscles and tough, fibrous tissue (ligaments).  CAUSES  A sudden blow to your back can cause lumbosacral strain. Also, anything that causes an excessive stretch of the muscles in the low back can cause this strain. This is typically seen when people exert themselves strenuously, fall, lift heavy objects, bend, or crouch repeatedly. RISK FACTORS  Physically demanding work.  Participation in pushing or pulling sports or sports that require sudden twist of the back (tennis, golf, baseball).  Weight lifting.  Excessive lower back curvature.  Forward-tilted pelvis.  Weak back or abdominal muscles or both.  Tight hamstrings. SIGNS AND SYMPTOMS  Lumbosacral strain may cause pain in the area of your injury or pain that moves (radiates) down your leg.  DIAGNOSIS Your health care provider can often diagnose lumbosacral strain through a physical exam. In some cases, you may need tests such as X-ray exams.  TREATMENT  Treatment for your lower back injury depends on many factors that your clinician will have to evaluate. However, most treatment will include the use of anti-inflammatory medicines. HOME CARE INSTRUCTIONS   Avoid hard physical activities (tennis, racquetball, waterskiing) if you are not in proper physical condition for it. This may aggravate or create problems.  If you have a back problem, avoid sports requiring sudden body movements. Swimming and walking are generally safer activities.  Maintain good posture.  Maintain a healthy weight.  For acute conditions, you may put ice on the injured area.  Put ice in a plastic bag.  Place a towel between your skin and the bag.  Leave the ice on for 20 minutes, 2 3 times a day.  When the  low back starts healing, stretching and strengthening exercises may be recommended. SEEK MEDICAL CARE IF:  Your back pain is getting worse.  You experience severe back pain not relieved with medicines. SEEK IMMEDIATE MEDICAL CARE IF:   You have numbness, tingling, weakness, or problems with the use of your arms or legs.  There is a change in bowel or bladder control.  You have increasing pain in any area of the body, including your belly (abdomen).  You notice shortness of breath, dizziness, or feel faint.  You feel sick to your stomach (nauseous), are throwing up (vomiting), or become sweaty.  You notice discoloration of your toes or legs, or your feet get very cold. MAKE SURE YOU:   Understand these instructions.  Will watch your condition.  Will get help right away if you are not doing well or get worse. Document Released: 04/05/2005 Document Revised: 04/16/2013 Document Reviewed: 02/12/2013 [facility] Patient Information 2014 [place], [place]. Back Exercises Back exercises help treat and prevent back injuries. The goal of back exercises is to increase the strength of your abdominal and back muscles and the flexibility of your back. These exercises should be started when you no longer have back pain. Back exercises include:  Pelvic Tilt. Lie on your back with your knees bent. Tilt your pelvis until the lower part of your back is against the floor. Hold this position 5 to 10 sec and repeat 5 to 10 times.  Knee to Chest. Pull first 1 knee up against your chest and hold for 20 to 30 seconds, repeat this with the other knee, and then both knees. This  may be done with the other leg straight or bent, whichever feels better.  Sit-Ups or Curl-Ups. Bend your knees 90 degrees. Start with tilting your pelvis, and do a partial, slow sit-up, lifting your trunk only 30 to 45 degrees off the floor. Take at least 2 to 3 seconds for each sit-up. Do not do sit-ups with your knees out straight. If  partial sit-ups are difficult, simply do the above but with only tightening your abdominal muscles and holding it as directed.  Hip-Lift. Lie on your back with your knees flexed 90 degrees. Push down with your feet and shoulders as you raise your hips a couple inches off the floor; hold for 10 seconds, repeat 5 to 10 times.  Back arches. Lie on your stomach, propping yourself up on bent elbows. Slowly press on your hands, causing an arch in your low back. Repeat 3 to 5 times. Any initial stiffness and discomfort should lessen with repetition over time.  Shoulder-Lifts. Lie face down with arms beside your body. Keep hips and torso pressed to floor as you slowly lift your head and shoulders off the floor. Do not overdo your exercises, especially in the beginning. Exercises may cause you some mild back discomfort which lasts for a few minutes; however, if the pain is more severe, or lasts for more than 15 minutes, do not continue exercises until you see your caregiver. Improvement with exercise therapy for back problems is slow.  See your caregivers for assistance with developing a proper back exercise program. Document Released: 08/03/2004 Document Revised: 09/18/2011 Document Reviewed: 04/27/2011 [facility] Patient Information 2014 [place].  Rest for the next 1-2 days and then start some stretching exercises Take valium as prescribed Ibuprofen for discomfort and heat packs as needed Should start having gradual improvement

## 2013-08-11 NOTE — ED Notes (Signed)
Per patient fell on Thursday and fell hitting steps. Patient c/o right lower back and hip pain. Patient c/o legs being sore bilaterally. Denies any hitting head or LOC. Patient ambulated to triage, limp in gait noted.

## 2013-08-11 NOTE — ED Provider Notes (Signed)
CSN: [id number]     Arrival date & time 08/11/13  1508 History  This chart was scribed for non-physician practitioner Elisha Headland, NP working with Ezequiel Essex, MD by Anastasia Pall, ED scribe. This patient was seen in room APFT22/APFT22 and the patient's care was started at 4:02 PM.   Chief Complaint  Patient presents with  . Fall  . Back Pain  . Hip Pain    The history is provided by the patient. No language interpreter was used.   HPI Comments: Cynthia [name] is a 63 y.o. female who presents to the Emergency Department complaining of constant, right lower back and right hip pain, onset 5 days ago when she slipped while turning around on stairs landing on her right hip and lower back. She denies hitting her head, LOC, wounds, and any other associated symptoms. She reports allergy to Aspirin.   PCP - Provider Not In System  Past Medical History  Diagnosis Date  . Migraine   . Hypertension   . Bronchitis   . High blood cholesterol level   . PTSD (post-traumatic stress disorder)   . COPD (chronic obstructive pulmonary disease)    Past Surgical History  Procedure Laterality Date  . Left arm     Family History  Problem Relation Age of Onset  . Heart failure Father   . Hypertension Father   . Stroke Father    History  Substance Use Topics  . Smoking status: Current Every Day Smoker -- 0.50 packs/day for 28 years    Types: Cigarettes  . Smokeless tobacco: Never Used  . Alcohol Use: No   OB History   Grav Para Term Preterm Abortions TAB SAB Ect Mult Living   2 1   1           Review of Systems  Musculoskeletal: Positive for arthralgias (right hip) and back pain (right, lower).  Skin: Negative for wound.  Neurological: Negative for syncope.  All other systems reviewed and are negative.    Allergies  Aspirin  Home Medications   Current Outpatient Rx  Name  Route  Sig  Dispense  Refill  . albuterol (PROVENTIL HFA;VENTOLIN HFA) 108 (90 BASE) MCG/ACT inhaler    Inhalation   Inhale 1-2 puffs into the lungs every 6 (six) hours as needed for wheezing or shortness of breath.   1 Inhaler   0   . albuterol (PROVENTIL) (5 MG/ML) 0.5% nebulizer solution   Nebulization   Take 0.5 mLs (2.5 mg total) by nebulization every 3 (three) hours as needed for wheezing or shortness of breath.   20 mL   12   . butalbital-acetaminophen-caffeine (FIORICET WITH CODEINE) 50-325-40-30 MG per capsule   Oral   Take 1 capsule by mouth every 6 (six) hours as needed for migraine.         Marland Kitchen CALCIUM PO   Oral   Take 1 tablet by mouth daily.         . citalopram (CELEXA) 10 MG tablet   Oral   Take 10 mg by mouth at bedtime.         . ferrous fumarate (HEMOCYTE - 106 MG FE) 325 (106 FE) MG TABS tablet   Oral   Take 1 tablet by mouth daily.         . furosemide (LASIX) 20 MG tablet   Oral   Take 20 mg by mouth daily.         Marland Kitchen guaiFENesin (MUCINEX) 600 MG 12 hr  tablet   Oral   Take 2 tablets (1,200 mg total) by mouth 2 (two) times daily.         . mirtazapine (REMERON) 15 MG tablet   Oral   Take 15 mg by mouth at bedtime.         . Multiple Vitamin (MULTIVITAMIN WITH MINERALS) TABS tablet   Oral   Take 1 tablet by mouth daily.         . potassium chloride (K-DUR) 10 MEQ tablet   Oral   Take 10 mEq by mouth daily.         . propranolol (INDERAL) 80 MG tablet   Oral   Take 80 mg by mouth 2 (two) times daily.         . rosuvastatin (CRESTOR) 10 MG tablet   Oral   Take 10 mg by mouth daily.         Marland Kitchen zinc gluconate 50 MG tablet   Oral   Take 50 mg by mouth daily.          BP 130/61  Pulse 75  Temp(Src) 97.9 F (36.6 C) (Oral)  Resp 18  Ht 5\' 5"  (1.651 m)  Wt 121 lb (54.885 kg)  BMI 20.14 kg/m2  SpO2 97%  Physical Exam  Nursing note and vitals reviewed. Constitutional: She is oriented to person, place, and time. She appears well-developed and well-nourished. No distress.  HENT:  Head: Normocephalic and atraumatic.   Eyes: EOM are normal.  Neck: Neck supple.  Cardiovascular: Normal rate.   Pulmonary/Chest: Effort normal. No respiratory distress.  Musculoskeletal: Normal range of motion. She exhibits tenderness.       Thoracic back: Normal.       Lumbar back: Normal.  Right peravertebral tenderness over right hip, at level of hip. No t-spine, l-spine, midline ttp. No step offs. Pt able to ambulate.   Neurological: She is alert and oriented to person, place, and time.  Skin: Skin is warm and dry.  Psychiatric: She has a normal mood and affect. Her behavior is normal.   ED Course  Procedures (including critical care time)  DIAGNOSTIC STUDIES: Oxygen Saturation is 97% on room air, normal by my interpretation.    COORDINATION OF CARE: 4:04 PM-Discussed treatment plan which includes right hip xray, pain medication, and muscle relaxant with pt at bedside and pt agreed to plan.   Labs Review Labs Reviewed - No data to display Imaging Review No results found.  EKG Interpretation   None      Medications - No data to display  MDM   1. Fall    No fracture or dislocation on right hip film. Pt ambulatory without difficulty. No weakness or focal deficits. Good strength, coordination and pulses distally. Toradol injection given in ER with some relief. Valium prescription given to take at home for muscle spasm and strain. Discussed plan of care. Return precautions given.    I personally performed the services described in this documentation, which was scribed in my presence. The recorded information has been reviewed and is accurate.    Elisha Headland, NP 08/24/13 [phone]

## 2013-08-11 NOTE — ED Notes (Signed)
Fell 4 days ago, pain in lt hip and back . Says she went out to feed the birds, and turned around and fell

## 2013-08-20 ENCOUNTER — Other Ambulatory Visit (HOSPITAL_COMMUNITY): Payer: Self-pay | Admitting: Physician Assistant

## 2013-08-20 DIAGNOSIS — R519 Headache, unspecified: Secondary | ICD-10-CM

## 2013-08-20 DIAGNOSIS — R51 Headache: Principal | ICD-10-CM

## 2013-08-25 NOTE — ED Provider Notes (Signed)
Medical screening examination/treatment/procedure(s) were performed by non-physician practitioner and as supervising physician I was immediately available for consultation/collaboration.  EKG Interpretation   None         Ezequiel Essex, MD 08/25/13 [phone]

## 2013-08-26 ENCOUNTER — Ambulatory Visit (HOSPITAL_COMMUNITY): Payer: MEDICAID

## 2013-08-29 ENCOUNTER — Ambulatory Visit (HOSPITAL_COMMUNITY)
Admission: RE | Admit: 2013-08-29 | Discharge: 2013-08-29 | Disposition: A | Discharge status: Home / Self Care | Payer: Medicaid Other | Source: Ambulatory Visit | Attending: Physician Assistant | Admitting: Physician Assistant

## 2013-08-29 DIAGNOSIS — R51 Headache: Secondary | ICD-10-CM | POA: Insufficient documentation

## 2013-08-29 DIAGNOSIS — R519 Headache, unspecified: Secondary | ICD-10-CM

## 2013-09-08 ENCOUNTER — Encounter (HOSPITAL_COMMUNITY): Payer: Self-pay | Admitting: Emergency Medicine

## 2013-09-08 ENCOUNTER — Emergency Department (HOSPITAL_COMMUNITY): Payer: Medicaid Other

## 2013-09-08 ENCOUNTER — Emergency Department (HOSPITAL_COMMUNITY)
Admission: EM | Admit: 2013-09-08 | Discharge: 2013-09-08 | Disposition: A | Discharge status: Home / Self Care | Payer: Medicaid Other | Attending: Emergency Medicine | Admitting: Emergency Medicine

## 2013-09-08 DIAGNOSIS — G43909 Migraine, unspecified, not intractable, without status migrainosus: Secondary | ICD-10-CM | POA: Insufficient documentation

## 2013-09-08 DIAGNOSIS — I10 Essential (primary) hypertension: Secondary | ICD-10-CM | POA: Insufficient documentation

## 2013-09-08 DIAGNOSIS — M25551 Pain in right hip: Secondary | ICD-10-CM

## 2013-09-08 DIAGNOSIS — Z79899 Other long term (current) drug therapy: Secondary | ICD-10-CM | POA: Insufficient documentation

## 2013-09-08 DIAGNOSIS — F4310 Post-traumatic stress disorder, unspecified: Secondary | ICD-10-CM | POA: Insufficient documentation

## 2013-09-08 DIAGNOSIS — F17200 Nicotine dependence, unspecified, uncomplicated: Secondary | ICD-10-CM | POA: Insufficient documentation

## 2013-09-08 DIAGNOSIS — J449 Chronic obstructive pulmonary disease, unspecified: Secondary | ICD-10-CM | POA: Insufficient documentation

## 2013-09-08 DIAGNOSIS — J4489 Other specified chronic obstructive pulmonary disease: Secondary | ICD-10-CM | POA: Insufficient documentation

## 2013-09-08 DIAGNOSIS — E7800 Pure hypercholesterolemia, unspecified: Secondary | ICD-10-CM | POA: Insufficient documentation

## 2013-09-08 DIAGNOSIS — M25559 Pain in unspecified hip: Secondary | ICD-10-CM | POA: Insufficient documentation

## 2013-09-08 DIAGNOSIS — G8911 Acute pain due to trauma: Secondary | ICD-10-CM | POA: Insufficient documentation

## 2013-09-08 MED ORDER — OXYCODONE-ACETAMINOPHEN 5-325 MG PO TABS
1.0000 | ORAL_TABLET | Freq: Once | ORAL | Status: AC
  Administered 2013-09-08: 1 via ORAL
  Filled 2013-09-08: qty 1

## 2013-09-08 MED ORDER — OXYCODONE-ACETAMINOPHEN 5-325 MG PO TABS: 1.0000 | ORAL_TABLET | ORAL | Status: DC | PRN

## 2013-09-08 NOTE — ED Notes (Signed)
Pt ambulated to nurses' station and back to room with stand-by assist. Stated she felt steady on her feet but that "it just hurts to put pressure on that leg".

## 2013-09-08 NOTE — Discharge Instructions (Signed)
Your x-rays and CAT scan did not show an obvious cause for the pain you're having. Please make an appointment with the orthopedic Dr. to arrange further evaluation. In the meantime, you may take oxycodone-acetaminophen as needed for pain. Use your walker when getting around at home. Return to the ED if you're having difficulty at home.  Acetaminophen; Oxycodone tablets What is this medicine? ACETAMINOPHEN; OXYCODONE (a set a MEE noe fen; ox i KOE done) is a pain reliever. It is used to treat mild to moderate pain. This medicine may be used for other purposes; ask your health care provider or pharmacist if you have questions. COMMON BRAND NAME(S): Endocet, Magnacet, Narvox, Percocet, Perloxx, Primalev, Primlev, Roxicet, Xolox What should I tell my health care provider before I take this medicine? They need to know if you have any of these conditions: -brain tumor -Crohn's disease, inflammatory bowel disease, or ulcerative colitis -drug abuse or addiction -head injury -heart or circulation problems -if you often drink alcohol -kidney disease or problems going to the bathroom -liver disease -lung disease, asthma, or breathing problems -an unusual or allergic reaction to acetaminophen, oxycodone, other opioid analgesics, other medicines, foods, dyes, or preservatives -pregnant or trying to get pregnant -breast-feeding How should I use this medicine? Take this medicine by mouth with a full glass of water. Follow the directions on the prescription label. Take your medicine at regular intervals. Do not take your medicine more often than directed. Talk to your pediatrician regarding the use of this medicine in children. Special care may be needed. Patients over 56 years old may have a stronger reaction and need a smaller dose. Overdosage: If you think you have taken too much of this medicine contact a poison control center or emergency room at once. NOTE: This medicine is only for you. Do not share  this medicine with others. What if I miss a dose? If you miss a dose, take it as soon as you can. If it is almost time for your next dose, take only that dose. Do not take double or extra doses. What may interact with this medicine? -alcohol -antihistamines -barbiturates like amobarbital, butalbital, butabarbital, methohexital, pentobarbital, phenobarbital, thiopental, and secobarbital -benztropine -drugs for bladder problems like solifenacin, trospium, oxybutynin, tolterodine, hyoscyamine, and methscopolamine -drugs for breathing problems like ipratropium and tiotropium -drugs for certain stomach or intestine problems like propantheline, homatropine methylbromide, glycopyrrolate, atropine, belladonna, and dicyclomine -general anesthetics like etomidate, ketamine, nitrous oxide, propofol, desflurane, enflurane, halothane, isoflurane, and sevoflurane -medicines for depression, anxiety, or psychotic disturbances -medicines for sleep -muscle relaxants -naltrexone -narcotic medicines (opiates) for pain -phenothiazines like perphenazine, thioridazine, chlorpromazine, mesoridazine, fluphenazine, prochlorperazine, promazine, and trifluoperazine -scopolamine -tramadol -trihexyphenidyl This list may not describe all possible interactions. Give your health care provider a list of all the medicines, herbs, non-prescription drugs, or dietary supplements you use. Also tell them if you smoke, drink alcohol, or use illegal drugs. Some items may interact with your medicine. What should I watch for while using this medicine? Tell your doctor or health care professional if your pain does not go away, if it gets worse, or if you have new or a different type of pain. You may develop tolerance to the medicine. Tolerance means that you will need a higher dose of the medication for pain relief. Tolerance is normal and is expected if you take this medicine for a long time. Do not suddenly stop taking your medicine  because you may develop a severe reaction. Your body becomes used to the  medicine. This does NOT mean you are addicted. Addiction is a behavior related to getting and using a drug for a non-medical reason. If you have pain, you have a medical reason to take pain medicine. Your doctor will tell you how much medicine to take. If your doctor wants you to stop the medicine, the dose will be slowly lowered over time to avoid any side effects. You may get drowsy or dizzy. Do not drive, use machinery, or do anything that needs mental alertness until you know how this medicine affects you. Do not stand or sit up quickly, especially if you are an older patient. This reduces the risk of dizzy or fainting spells. Alcohol may interfere with the effect of this medicine. Avoid alcoholic drinks. There are different types of narcotic medicines (opiates) for pain. If you take more than one type at the same time, you may have more side effects. Give your health care provider a list of all medicines you use. Your doctor will tell you how much medicine to take. Do not take more medicine than directed. Call emergency for help if you have problems breathing. The medicine will cause constipation. Try to have a bowel movement at least every 2 to 3 days. If you do not have a bowel movement for 3 days, call your doctor or health care professional. Do not take Tylenol (acetaminophen) or medicines that have acetaminophen with this medicine. Too much acetaminophen can be very dangerous. Many nonprescription medicines contain acetaminophen. Always read the labels carefully to avoid taking more acetaminophen. What side effects may I notice from receiving this medicine? Side effects that you should report to your doctor or health care professional as soon as possible: -allergic reactions like skin rash, itching or hives, swelling of the face, lips, or tongue -breathing difficulties, wheezing -confusion -light headedness or fainting  spells -severe stomach pain -unusually weak or tired -yellowing of the skin or the whites of the eyes  Side effects that usually do not require medical attention (report to your doctor or health care professional if they continue or are bothersome): -dizziness -drowsiness -nausea -vomiting This list may not describe all possible side effects. Call your doctor for medical advice about side effects. You may report side effects to FDA at 1-800-FDA-1088. Where should I keep my medicine? Keep out of the reach of children. This medicine can be abused. Keep your medicine in a safe place to protect it from theft. Do not share this medicine with anyone. Selling or giving away this medicine is dangerous and against the law. Store at room temperature between 20 and 25 degrees C (68 and 77 degrees F). Keep container tightly closed. Protect from light. This medicine may cause accidental overdose and death if it is taken by other adults, children, or pets. Flush any unused medicine down the toilet to reduce the chance of harm. Do not use the medicine after the expiration date. NOTE: This sheet is a summary. It may not cover all possible information. If you have questions about this medicine, talk to your doctor, pharmacist, or health care provider.  2014, [employer]. (2013-02-17 13:17:35)

## 2013-09-08 NOTE — ED Notes (Signed)
Patient with no complaints at this time. Respirations even and unlabored. Skin warm/dry. Discharge instructions reviewed with patient at this time. Patient given opportunity to voice concerns/ask questions. Patient discharged at this time and left Emergency Department with steady gait.   

## 2013-09-08 NOTE — ED Provider Notes (Signed)
CSN: [id number]     Arrival date & time 09/08/13  0402 History   First MD Initiated Contact with Patient 09/08/13 0421     Chief Complaint  Patient presents with  . Hip Pain     (Consider location/radiation/quality/duration/timing/severity/associated sxs/prior Treatment) Patient is a 63 y.o. female presenting with hip pain. The history is provided by the patient.  Hip Pain  She has been having pain in her right hip for the last 2 days. Pain is severe and she is having difficulty standing. She states when she first stands up, it is very severe but actually gets better and she stays standing. She rates pain at 10/10. She had fallen about 2 weeks ago and states that she had hip pain at that time but that pain got better before recurring 2 days ago. She denies any additional trauma. She has not taken anything for pain.  Past Medical History  Diagnosis Date  . Migraine   . Hypertension   . Bronchitis   . High blood cholesterol level   . PTSD (post-traumatic stress disorder)   . COPD (chronic obstructive pulmonary disease)    Past Surgical History  Procedure Laterality Date  . Left arm     Family History  Problem Relation Age of Onset  . Heart failure Father   . Hypertension Father   . Stroke Father    History  Substance Use Topics  . Smoking status: Current Every Day Smoker -- 0.50 packs/day for 28 years    Types: Cigarettes  . Smokeless tobacco: Never Used  . Alcohol Use: No   OB History   Grav Para Term Preterm Abortions TAB SAB Ect Mult Living   2 1   1           Review of Systems  All other systems reviewed and are negative.      Allergies  Aspirin  Home Medications   Current Outpatient Rx  Name  Route  Sig  Dispense  Refill  . albuterol (PROVENTIL HFA;VENTOLIN HFA) 108 (90 BASE) MCG/ACT inhaler   Inhalation   Inhale 1-2 puffs into the lungs every 6 (six) hours as needed for wheezing or shortness of breath.   1 Inhaler   0   . albuterol (PROVENTIL) (5  MG/ML) 0.5% nebulizer solution   Nebulization   Take 0.5 mLs (2.5 mg total) by nebulization every 3 (three) hours as needed for wheezing or shortness of breath.   20 mL   12   . butalbital-acetaminophen-caffeine (FIORICET WITH CODEINE) 50-325-40-30 MG per capsule   Oral   Take 1 capsule by mouth every 6 (six) hours as needed for migraine.         Marland Kitchen CALCIUM PO   Oral   Take 1 tablet by mouth daily.         . citalopram (CELEXA) 10 MG tablet   Oral   Take 10 mg by mouth at bedtime.         . diazepam (VALIUM) 5 MG tablet   Oral   Take 1 tablet (5 mg total) by mouth 2 (two) times daily.   15 tablet   0   . ferrous fumarate (HEMOCYTE - 106 MG FE) 325 (106 FE) MG TABS tablet   Oral   Take 1 tablet by mouth daily.         . furosemide (LASIX) 20 MG tablet   Oral   Take 20 mg by mouth daily.         Marland Kitchen  guaiFENesin (MUCINEX) 600 MG 12 hr tablet   Oral   Take 2 tablets (1,200 mg total) by mouth 2 (two) times daily.         . mirtazapine (REMERON) 15 MG tablet   Oral   Take 15 mg by mouth at bedtime.         . Multiple Vitamin (MULTIVITAMIN WITH MINERALS) TABS tablet   Oral   Take 1 tablet by mouth daily.         . potassium chloride (K-DUR) 10 MEQ tablet   Oral   Take 10 mEq by mouth daily.         . propranolol (INDERAL) 80 MG tablet   Oral   Take 80 mg by mouth 2 (two) times daily.         . rosuvastatin (CRESTOR) 10 MG tablet   Oral   Take 10 mg by mouth daily.         Marland Kitchen zinc gluconate 50 MG tablet   Oral   Take 50 mg by mouth daily.          BP 170/83  Pulse 81  Temp(Src) 98.4 F (36.9 C) (Oral)  Resp 22  SpO2 100% Physical Exam  Nursing note and vitals reviewed.  63 year old female, who appears somewhat uncomfortable, but is in no acute distress. Vital signs are significant for tachypnea with respiratory rate of 22, and hypertension with blood pressure 170/83. Oxygen saturation is 100%, which is normal. Head is normocephalic  and atraumatic. PERRLA, EOMI. Oropharynx is clear. Neck is nontender and supple without adenopathy or JVD. Back is nontender and there is no CVA tenderness. Lungs are clear without rales, wheezes, or rhonchi. Chest is nontender. Heart has regular rate and rhythm without murmur. Abdomen is soft, flat, nontender without masses or hepatosplenomegaly and peristalsis is normoactive. Extremities: There is tenderness to palpation over the posterior aspect of the right side of the pelvis. There is no tenderness over the pubic ramus and no tenderness over the hip joint itself. Full passive range of motion is present of the right hip without eliciting pain. The leg is not shortened or rotated. No other extremity injuries seen there is no peripheral edema.. Skin is warm and dry without rash. Neurologic: Mental status is normal, cranial nerves are intact, there are no motor or sensory deficits.  ED Course  Procedures (including critical care time) Imaging Review Dg Hip Complete Right  09/08/2013   CLINICAL DATA:  Pain.  Recent hip strain.  EXAM: RIGHT HIP - COMPLETE 2+ VIEW  COMPARISON:  08/11/2013  FINDINGS: There is no evidence of hip fracture or dislocation. There is no evidence of arthropathy or other focal bone abnormality. There is an incidental synovial inclusion pit. No evidence of pelvic ring or left hip abnormality.  IMPRESSION: Negative.   Electronically Signed   By: Jorje Guild M.D.   On: 09/08/2013 05:08   Ct Pelvis Wo Contrast  09/08/2013   CLINICAL DATA:  Hip pain after fall 1.5 weeks ago  EXAM: CT PELVIS WITHOUT CONTRAST  TECHNIQUE: Multidetector CT imaging of the pelvis was performed following the standard protocol without intravenous contrast.  COMPARISON:  Abdomen pelvis CT 03/20/2009  FINDINGS: No evidence of hip fracture, pelvic ring fracture, or dislocation/diastasis. There is mild marginal spurring around both hip joints, without advanced joint narrowing. No evidence of hip joint  effusion. No evidence of soft tissue inflammation or edema/injury. No intrapelvic traumatic findings.  IMPRESSION: No acute osseous abnormality involving  the pelvis or hips.   Electronically Signed   By: Jorje Guild M.D.   On: 09/08/2013 05:34   Images viewed by me.  MDM   Final diagnoses:  Pain in right hip    Right hip/pelvis pain of uncertain cause. Old records are reviewed and her visit for fall and right hip pain was actually one month ago. I reviewed those x-rays and there is no indication of any fracture at that time. However, it is not uncommon for subtle fractures in the hip to not be visible on initial x-ray, so x-ray will be repeated. She's given a dose of oxycodone-acetaminophen for pain. X-ray showed no evidence of fracture. She is sent for CT scan which also shows no evidence of occult fracture. Pain is significantly improved with oxycodone acetaminophen. Patient has ambulated with assistance and needed only a small amount of assistance. She states she has a walker at home. She is discharged with prescription for oxycodone and acetaminophen and is referred to orthopedics for followup.    Delora Fuel, MD 41/03/01 3143

## 2013-09-08 NOTE — ED Notes (Signed)
Pt. Reports falling a week and half ago, pt. Was evaluated at that time and was dx with a right hip strain. Pt. States that pain started back on Saturday. Pt reports severe right hip pain. Pt. Unable to apply pressure to right leg.

## 2013-09-11 ENCOUNTER — Telehealth: Payer: Self-pay | Admitting: Orthopedic Surgery

## 2013-09-11 NOTE — Telephone Encounter (Signed)
Patient stopped in office inquiring about cost for scheduling appointment in our office as a self-pay patient, for problem of hip pain status post fall.  She was seen in the Emergency Room at [facility] for 2 visits, most recently on 09/15/13.  Notes indicate no evidence of fracture.  Discussed payment options, although patient states she may have Medicaid, effective this month or by next month.  Advised that she will most likely need a referral from the primary care physician which may be designated on the insurance card.  She will therefore wait for the card, and then call to schedule accordingly.  [phone]

## 2013-11-10 ENCOUNTER — Other Ambulatory Visit (HOSPITAL_COMMUNITY): Payer: Self-pay | Admitting: Physician Assistant

## 2013-11-10 DIAGNOSIS — R109 Unspecified abdominal pain: Secondary | ICD-10-CM

## 2013-11-12 ENCOUNTER — Encounter (HOSPITAL_COMMUNITY): Payer: Self-pay

## 2013-11-12 ENCOUNTER — Ambulatory Visit (HOSPITAL_COMMUNITY)
Admission: RE | Admit: 2013-11-12 | Discharge: 2013-11-12 | Disposition: A | Discharge status: Home / Self Care | Payer: Medicaid Other | Source: Ambulatory Visit | Attending: Physician Assistant | Admitting: Physician Assistant

## 2013-11-12 DIAGNOSIS — J449 Chronic obstructive pulmonary disease, unspecified: Secondary | ICD-10-CM | POA: Insufficient documentation

## 2013-11-12 DIAGNOSIS — R109 Unspecified abdominal pain: Secondary | ICD-10-CM | POA: Insufficient documentation

## 2013-11-12 DIAGNOSIS — I10 Essential (primary) hypertension: Secondary | ICD-10-CM | POA: Insufficient documentation

## 2013-11-12 DIAGNOSIS — J4489 Other specified chronic obstructive pulmonary disease: Secondary | ICD-10-CM | POA: Insufficient documentation

## 2013-11-12 DIAGNOSIS — R197 Diarrhea, unspecified: Secondary | ICD-10-CM | POA: Insufficient documentation

## 2013-11-12 MED ORDER — IOHEXOL 300 MG/ML  SOLN
100.0000 mL | Freq: Once | INTRAMUSCULAR | Status: AC | PRN
  Administered 2013-11-12: 100 mL via INTRAVENOUS

## 2014-04-01 ENCOUNTER — Other Ambulatory Visit (HOSPITAL_COMMUNITY): Payer: Self-pay | Admitting: Physician Assistant

## 2014-04-01 DIAGNOSIS — Z1231 Encounter for screening mammogram for malignant neoplasm of breast: Secondary | ICD-10-CM

## 2014-04-06 ENCOUNTER — Ambulatory Visit (INDEPENDENT_AMBULATORY_CARE_PROVIDER_SITE_OTHER): Payer: Medicaid Other | Admitting: Cardiology

## 2014-04-06 ENCOUNTER — Encounter: Payer: Self-pay | Admitting: Cardiology

## 2014-04-06 VITALS — BP 136/84 | HR 74 | Ht 65.0 in | Wt 138.0 lb

## 2014-04-06 DIAGNOSIS — R0789 Other chest pain: Secondary | ICD-10-CM

## 2014-04-06 NOTE — Progress Notes (Signed)
Clinical Summary [name] is a 63 y.o.female seen to today as a new patient for the following medical problems.  1. Chest pain - started approx 2-3 weeks ago - sharp pain, can be left or right chest, 2/10 in severity. Can occur at rest or with exertion. Can feel diaphoretic, can have SOB as well - worst with deep breaths - pain lasts for just a few seconds. Occurs approx every other day. Notes increase in frequency since onset, stable severity. - has noted some DOE over the last few months, reports DOE at approx 1 block. - notes some occas LE edema  CAD risk factors: tobacco, HTN, hyperlipidemia, mother "bad heart" unsure of onset, father "heart troubles" unsure what type.    Past Medical History  Diagnosis Date  . Migraine   . Hypertension   . Bronchitis   . High blood cholesterol level   . PTSD (post-traumatic stress disorder)   . COPD (chronic obstructive pulmonary disease)      Allergies  Allergen Reactions  . Aspirin     REACTION: Stomach upset     Current Outpatient Prescriptions  Medication Sig Dispense Refill  . albuterol (PROVENTIL HFA;VENTOLIN HFA) 108 (90 BASE) MCG/ACT inhaler Inhale 1-2 puffs into the lungs every 6 (six) hours as needed for wheezing or shortness of breath.  1 Inhaler  0  . albuterol (PROVENTIL) (5 MG/ML) 0.5% nebulizer solution Take 0.5 mLs (2.5 mg total) by nebulization every 3 (three) hours as needed for wheezing or shortness of breath.  20 mL  12  . butalbital-acetaminophen-caffeine (FIORICET WITH CODEINE) 50-325-40-30 MG per capsule Take 1 capsule by mouth every 6 (six) hours as needed for migraine.      Marland Kitchen CALCIUM PO Take 1 tablet by mouth daily.      . citalopram (CELEXA) 10 MG tablet Take 10 mg by mouth at bedtime.      . diazepam (VALIUM) 5 MG tablet Take 1 tablet (5 mg total) by mouth 2 (two) times daily.  15 tablet  0  . ferrous fumarate (HEMOCYTE - 106 MG FE) 325 (106 FE) MG TABS tablet Take 1 tablet by mouth daily.      .  furosemide (LASIX) 20 MG tablet Take 20 mg by mouth daily.      Marland Kitchen guaiFENesin (MUCINEX) 600 MG 12 hr tablet Take 2 tablets (1,200 mg total) by mouth 2 (two) times daily.      . mirtazapine (REMERON) 15 MG tablet Take 15 mg by mouth at bedtime.      . Multiple Vitamin (MULTIVITAMIN WITH MINERALS) TABS tablet Take 1 tablet by mouth daily.      Marland Kitchen oxyCODONE-acetaminophen (PERCOCET/ROXICET) 5-325 MG per tablet Take 1 tablet by mouth every 4 (four) hours as needed for severe pain.  20 tablet  0  . potassium chloride (K-DUR) 10 MEQ tablet Take 10 mEq by mouth daily.      . propranolol (INDERAL) 80 MG tablet Take 80 mg by mouth 2 (two) times daily.      . rosuvastatin (CRESTOR) 10 MG tablet Take 10 mg by mouth daily.      Marland Kitchen zinc gluconate 50 MG tablet Take 50 mg by mouth daily.       No current facility-administered medications for this visit.     Past Surgical History  Procedure Laterality Date  . Left arm       Allergies  Allergen Reactions  . Aspirin     REACTION: Stomach upset  Family History  Problem Relation Age of Onset  . Heart failure Father   . Hypertension Father   . Stroke Father      Social History [name] reports that she has been smoking Cigarettes.  She has a 14 pack-year smoking history. She has never used smokeless tobacco. [name] reports that she does not drink alcohol.   Review of Systems CONSTITUTIONAL: No weight loss, fever, chills, weakness or fatigue.  HEENT: Eyes: No visual loss, blurred vision, double vision or yellow sclerae.No hearing loss, sneezing, congestion, runny nose or sore throat.  SKIN: No rash or itching.  CARDIOVASCULAR: per HPI RESPIRATORY: No shortness of breath, cough or sputum.  GASTROINTESTINAL: No anorexia, nausea, vomiting or diarrhea. No abdominal pain or blood.  GENITOURINARY: No burning on urination, no polyuria NEUROLOGICAL: No headache, dizziness, syncope, paralysis, ataxia, numbness or tingling in the  extremities. No change in bowel or bladder control.  MUSCULOSKELETAL: No muscle, back pain, joint pain or stiffness.  LYMPHATICS: No enlarged nodes. No history of splenectomy.  PSYCHIATRIC: No history of depression or anxiety.  ENDOCRINOLOGIC: No reports of sweating, cold or heat intolerance. No polyuria or polydipsia.  Marland Kitchen   Physical Examination p 68 bp 140/82 Wt 183 lbs BMI 29 Gen: resting comfortably, no acute distress HEENT: no scleral icterus, pupils equal round and reactive, no palptable cervical adenopathy,  CV: RRR, no m/r/g, no JVD, no carotid bruits Resp: Clear to auscultation bilaterally GI: abdomen is soft, non-tender, non-distended, normal bowel sounds, no hepatosplenomegaly MSK: extremities are warm, no edema.  Skin: warm, no rash Neuro:  no focal deficits Psych: appropriate affect   Diagnostic Studies EKG: SR, diffuse TWIs precordial leads    Assessment and Plan  1. Chest pain - unclear etiology, she has multiple CAD risk factors and abnormal EKG at baseline. Will obtain exercise cardiolite stress test, hold propanolol on day of stress   F/u 1 month       Cynthia [name], M.D.

## 2014-04-06 NOTE — Patient Instructions (Signed)
Your physician recommends that you schedule a follow-up appointment in: 1 month     Your physician has requested that you have en exercise stress myoview. For further information please visit [email or web address]. Please follow instruction sheet, as given.     PLEASE HOLD PROPRANOLOL TH MORNING OF THE TEST     Thank you for choosing [facility] !

## 2014-04-13 ENCOUNTER — Ambulatory Visit (HOSPITAL_COMMUNITY)
Admission: RE | Admit: 2014-04-13 | Discharge: 2014-04-13 | Disposition: A | Discharge status: Home / Self Care | Payer: Medicaid Other | Source: Ambulatory Visit | Attending: Physician Assistant | Admitting: Physician Assistant

## 2014-04-13 DIAGNOSIS — Z1231 Encounter for screening mammogram for malignant neoplasm of breast: Secondary | ICD-10-CM

## 2014-04-13 LAB — HM MAMMOGRAPHY

## 2014-04-15 ENCOUNTER — Ambulatory Visit (HOSPITAL_COMMUNITY)
Admission: RE | Admit: 2014-04-15 | Discharge: 2014-04-15 | Disposition: A | Discharge status: Home / Self Care | Payer: Medicaid Other | Source: Ambulatory Visit | Attending: Cardiology | Admitting: Cardiology

## 2014-04-15 ENCOUNTER — Encounter (HOSPITAL_COMMUNITY): Payer: Self-pay

## 2014-04-15 ENCOUNTER — Encounter (HOSPITAL_COMMUNITY)
Admission: RE | Admit: 2014-04-15 | Discharge: 2014-04-15 | Disposition: A | Discharge status: Home / Self Care | Payer: Self-pay | Source: Ambulatory Visit | Attending: Cardiology | Admitting: Cardiology

## 2014-04-15 ENCOUNTER — Encounter (HOSPITAL_COMMUNITY)
Admission: RE | Admit: 2014-04-15 | Discharge: 2014-04-15 | Disposition: A | Discharge status: Home / Self Care | Payer: Medicaid Other | Source: Ambulatory Visit | Attending: Cardiology | Admitting: Cardiology

## 2014-04-15 DIAGNOSIS — R0789 Other chest pain: Secondary | ICD-10-CM | POA: Insufficient documentation

## 2014-04-15 DIAGNOSIS — F17200 Nicotine dependence, unspecified, uncomplicated: Secondary | ICD-10-CM | POA: Insufficient documentation

## 2014-04-15 DIAGNOSIS — R079 Chest pain, unspecified: Secondary | ICD-10-CM

## 2014-04-15 DIAGNOSIS — R0600 Dyspnea, unspecified: Secondary | ICD-10-CM | POA: Insufficient documentation

## 2014-04-15 DIAGNOSIS — I10 Essential (primary) hypertension: Secondary | ICD-10-CM | POA: Insufficient documentation

## 2014-04-15 DIAGNOSIS — F411 Generalized anxiety disorder: Secondary | ICD-10-CM

## 2014-04-15 DIAGNOSIS — E785 Hyperlipidemia, unspecified: Secondary | ICD-10-CM | POA: Insufficient documentation

## 2014-04-15 MED ORDER — REGADENOSON 0.4 MG/5ML IV SOLN
INTRAVENOUS | Status: AC
  Administered 2014-04-15: 0.4 mg via INTRAVENOUS
  Filled 2014-04-15: qty 5

## 2014-04-15 MED ORDER — TECHNETIUM TC 99M SESTAMIBI - CARDIOLITE
30.0000 | Freq: Once | INTRAVENOUS | Status: AC | PRN
  Administered 2014-04-15: 12:00:00 30 via INTRAVENOUS

## 2014-04-15 MED ORDER — TECHNETIUM TC 99M SESTAMIBI GENERIC - CARDIOLITE
10.0000 | Freq: Once | INTRAVENOUS | Status: AC | PRN
  Administered 2014-04-15: 10 via INTRAVENOUS

## 2014-04-15 MED ORDER — SODIUM CHLORIDE 0.9 % IJ SOLN
INTRAMUSCULAR | Status: AC
  Administered 2014-04-15: 10 mL via INTRAVENOUS
  Filled 2014-04-15: qty 10

## 2014-04-15 MED ORDER — SODIUM CHLORIDE 0.9 % IJ SOLN
10.0000 mL | INTRAMUSCULAR | Status: DC | PRN
  Administered 2014-04-15: 10 mL via INTRAVENOUS

## 2014-04-15 MED ORDER — REGADENOSON 0.4 MG/5ML IV SOLN
0.4000 mg | Freq: Once | INTRAVENOUS | Status: AC
  Administered 2014-04-15: 0.4 mg via INTRAVENOUS

## 2014-04-15 NOTE — Progress Notes (Signed)
Stress Lab Nurses Notes - [facility] 04/15/2014 Reason for doing test: Chest Pain Type of test: Test Changed stress cardiolite ordered, Gave lexiscan due to Pt being SOB and unable to tolerate treadmill Nurse performing test: Benay Pillow RN Nuclear Medicine Tech: Melburn Hake Echo Tech: Not Applicable MD performing test: Ralene Ok PA Family MD: [facility] Test explained and consent signed: Yes.   IV started: Saline lock started in radiology Symptoms: SOB, fatigue Treatment/Intervention: None Reason test stopped: SOB (Pt then stated she currently had Bronchitis) After recovery IV was: Discontinued via X-ray tech and No redness or edema Patient to return to Nuc. Med at : 1155 Patient discharged: Home Patient's Condition upon discharge was: stable Comments: resolved in recovery. peak BP152/95 HR 106, recovery BP 130/86, HR 87 Varsha Knock M

## 2014-04-30 ENCOUNTER — Emergency Department (HOSPITAL_COMMUNITY)
Admission: EM | Admit: 2014-04-30 | Discharge: 2014-04-30 | Disposition: A | Discharge status: Home / Self Care | Payer: Medicaid Other | Attending: Emergency Medicine | Admitting: Emergency Medicine

## 2014-04-30 ENCOUNTER — Encounter (HOSPITAL_COMMUNITY): Payer: Self-pay | Admitting: Emergency Medicine

## 2014-04-30 DIAGNOSIS — Y929 Unspecified place or not applicable: Secondary | ICD-10-CM | POA: Insufficient documentation

## 2014-04-30 DIAGNOSIS — Z79899 Other long term (current) drug therapy: Secondary | ICD-10-CM | POA: Insufficient documentation

## 2014-04-30 DIAGNOSIS — J449 Chronic obstructive pulmonary disease, unspecified: Secondary | ICD-10-CM | POA: Insufficient documentation

## 2014-04-30 DIAGNOSIS — F4310 Post-traumatic stress disorder, unspecified: Secondary | ICD-10-CM | POA: Insufficient documentation

## 2014-04-30 DIAGNOSIS — Z720 Tobacco use: Secondary | ICD-10-CM | POA: Insufficient documentation

## 2014-04-30 DIAGNOSIS — X58XXXA Exposure to other specified factors, initial encounter: Secondary | ICD-10-CM | POA: Insufficient documentation

## 2014-04-30 DIAGNOSIS — I10 Essential (primary) hypertension: Secondary | ICD-10-CM | POA: Insufficient documentation

## 2014-04-30 DIAGNOSIS — Y9389 Activity, other specified: Secondary | ICD-10-CM | POA: Insufficient documentation

## 2014-04-30 DIAGNOSIS — S161XXA Strain of muscle, fascia and tendon at neck level, initial encounter: Secondary | ICD-10-CM | POA: Insufficient documentation

## 2014-04-30 DIAGNOSIS — Z8679 Personal history of other diseases of the circulatory system: Secondary | ICD-10-CM | POA: Insufficient documentation

## 2014-04-30 MED ORDER — LORAZEPAM 2 MG/ML IJ SOLN
1.0000 mg | Freq: Once | INTRAMUSCULAR | Status: AC
  Administered 2014-04-30: 1 mg via INTRAVENOUS
  Filled 2014-04-30: qty 1

## 2014-04-30 MED ORDER — MORPHINE SULFATE 4 MG/ML IJ SOLN
6.0000 mg | Freq: Once | INTRAMUSCULAR | Status: AC
  Administered 2014-04-30: 6 mg via INTRAVENOUS
  Filled 2014-04-30: qty 2

## 2014-04-30 MED ORDER — OXYCODONE-ACETAMINOPHEN 5-325 MG PO TABS: 1.0000 | ORAL_TABLET | Freq: Four times a day (QID) | ORAL | Status: DC | PRN

## 2014-04-30 MED ORDER — CYCLOBENZAPRINE HCL 10 MG PO TABS: 10.0000 mg | ORAL_TABLET | Freq: Three times a day (TID) | ORAL | Status: DC

## 2014-04-30 NOTE — ED Notes (Signed)
MD at bedside. 

## 2014-04-30 NOTE — Discharge Instructions (Signed)
Follow up with Dr. Aline Brochure next week.

## 2014-04-30 NOTE — ED Provider Notes (Signed)
CSN: [id number]     Arrival date & time 04/30/14  4034 History  This chart was scribed for Cynthia Diego, MD by Ludger Nutting, ED Scribe. This patient was seen in room [facility]14/[facility]14 and the patient's care was started 8:05 AM.    Chief Complaint  Patient presents with  . Neck Pain   Patient is a 63 y.o. female presenting with musculoskeletal pain. The history is provided by the patient (pt comlains of neck muscle pain). No language interpreter was used.  Muscle Pain This is a new problem. The current episode started 2 days ago. The problem occurs constantly. The problem has not changed since onset.Pertinent negatives include no chest pain, no abdominal pain and no headaches.    HPI Comments: Cynthia [name] is a 63 y.o. female who presents to the Emergency Department complaining of constant, unchanged right lateral and posterior neck pain that began 3 days ago. She denies any injury or trauma to the affected area. She reports taking hydrocodone PTA without significant relief. She states her pain is worse with movement and touch. She denies fever, cough, sore throat.   Past Medical History  Diagnosis Date  . Migraine   . Hypertension   . Bronchitis   . High blood cholesterol level   . PTSD (post-traumatic stress disorder)   . COPD (chronic obstructive pulmonary disease)    Past Surgical History  Procedure Laterality Date  . Left arm     Family History  Problem Relation Age of Onset  . Heart failure Father   . Hypertension Father   . Stroke Father    History  Substance Use Topics  . Smoking status: Current Every Day Smoker -- 0.50 packs/day for 28 years    Types: Cigarettes  . Smokeless tobacco: Never Used     Comment: 3 ciggs per day  . Alcohol Use: No   OB History   Grav Para Term Preterm Abortions TAB SAB Ect Mult Living   2 1   1           Review of Systems  Constitutional: Negative for appetite change and fatigue.  HENT: Negative for congestion, ear discharge and  sinus pressure.   Eyes: Negative for discharge.  Respiratory: Negative for cough.   Cardiovascular: Negative for chest pain.  Gastrointestinal: Negative for abdominal pain and diarrhea.  Genitourinary: Negative for frequency and hematuria.  Musculoskeletal: Positive for neck pain. Negative for back pain.  Skin: Negative for rash.  Neurological: Negative for seizures and headaches.  Psychiatric/Behavioral: Negative for hallucinations.      Allergies  Aspirin  Home Medications   Prior to Admission medications   Medication Sig Start Date End Date Taking? Authorizing Provider  budesonide (PULMICORT) 180 MCG/ACT inhaler Inhale 1 puff into the lungs 2 (two) times daily.    Historical Provider, MD  CALCIUM PO Take 1 tablet by mouth daily.    Historical Provider, MD  citalopram (CELEXA) 40 MG tablet Take 40 mg by mouth daily.    Historical Provider, MD  hydrOXYzine (VISTARIL) 25 MG capsule Take 25 mg by mouth 3 (three) times daily as needed.    Historical Provider, MD  IRON PO Take 65 mg by mouth.    Historical Provider, MD  mirtazapine (REMERON) 30 MG tablet Take 30 mg by mouth at bedtime.    Historical Provider, MD  Multiple Vitamin (MULTIVITAMIN WITH MINERALS) TABS tablet Take 1 tablet by mouth daily.    Historical Provider, MD  nitroGLYCERIN (NITROSTAT) 0.4 MG SL  tablet Place 0.4 mg under the tongue every 5 (five) minutes as needed for chest pain.    Historical Provider, MD  oxyCODONE-acetaminophen (PERCOCET/ROXICET) 5-325 MG per tablet Take 1 tablet by mouth every 4 (four) hours as needed for severe pain. 09/14/08   Delora Fuel, MD  propranolol (INDERAL) 80 MG tablet Take 80 mg by mouth 2 (two) times daily.    Historical Provider, MD  zinc gluconate 50 MG tablet Take 50 mg by mouth daily.    Historical Provider, MD   BP 152/94  Pulse 84  Temp(Src) 98.9 F (37.2 C) (Oral)  Resp 16  Ht 5\' 5"  (1.651 m)  Wt 135 lb (61.236 kg)  BMI 22.47 kg/m2  SpO2 96% Physical Exam  Constitutional:  She is oriented to person, place, and time. She appears well-developed.  HENT:  Head: Normocephalic.  Eyes: Conjunctivae and EOM are normal. No scleral icterus.  Neck: Neck supple. No thyromegaly present.  Right sternoclavicular muscle is tender, swollen, and very tight   Cardiovascular: Normal rate and regular rhythm.  Exam reveals no gallop and no friction rub.   No murmur heard. Pulmonary/Chest: No stridor. She has no wheezes. She has no rales. She exhibits no tenderness.  Abdominal: She exhibits no distension. There is no tenderness. There is no rebound.  Musculoskeletal: Normal range of motion. She exhibits no edema.  Lymphadenopathy:    She has no cervical adenopathy.  Neurological: She is oriented to person, place, and time. She exhibits normal muscle tone. Coordination normal.  Skin: No rash noted. No erythema.  Psychiatric: She has a normal mood and affect. Her behavior is normal.    ED Course  Procedures (including critical care time)  DIAGNOSTIC STUDIES: Oxygen Saturation is 96% on RA, adequate by my interpretation.    COORDINATION OF CARE: 8:09 AM Discussed treatment plan with pt at bedside and pt agreed to plan.   Labs Review Labs Reviewed - No data to display  Imaging Review No results found.   EKG Interpretation None      MDM   Final diagnoses:  None   The chart was scribed for me under my direct supervision.  I personally performed the history, physical, and medical decision making and all procedures in the evaluation of this patient.Cynthia Diego, MD 04/30/14 1058

## 2014-04-30 NOTE — ED Notes (Signed)
Pt c/o neck pain that started 3 days ago. Denies injury. Pt took hydrocodone before getting picked up by EMS this morning.

## 2014-05-01 ENCOUNTER — Ambulatory Visit: Payer: Self-pay | Admitting: Cardiology

## 2014-05-11 ENCOUNTER — Encounter (HOSPITAL_COMMUNITY): Payer: Self-pay | Admitting: Emergency Medicine

## 2014-07-28 ENCOUNTER — Encounter: Payer: Self-pay | Admitting: Cardiology

## 2014-07-28 ENCOUNTER — Ambulatory Visit (INDEPENDENT_AMBULATORY_CARE_PROVIDER_SITE_OTHER): Payer: Self-pay | Admitting: Cardiology

## 2014-07-28 VITALS — BP 136/82 | HR 77 | Ht 65.0 in | Wt 130.0 lb

## 2014-07-28 DIAGNOSIS — I10 Essential (primary) hypertension: Secondary | ICD-10-CM

## 2014-07-28 DIAGNOSIS — R0789 Other chest pain: Secondary | ICD-10-CM

## 2014-07-28 NOTE — Patient Instructions (Signed)
Your physician recommends that you schedule a follow-up appointment only if needed      Thank you for choosing [facility] !

## 2014-07-28 NOTE — Progress Notes (Signed)
Clinical Summary [name] is a 64 y.o.female seen today for follow up of the following medical problems.   1. Chest pain  - seen last visit for chest pain - referred for nuclear stress test, no evidence of ischemia - since last visit, reports one isolated episode of sharp left sided chest pain lasting just a few seconds. No other episodes.   2. HTN - does not check bp at home - compliant with meds.She is on propanolol for HTN and migraines.      Past Medical History  Diagnosis Date  . Migraine   . Hypertension   . Bronchitis   . High blood cholesterol level   . PTSD (post-traumatic stress disorder)   . COPD (chronic obstructive pulmonary disease)      Allergies  Allergen Reactions  . Aspirin     REACTION: Stomach upset     Current Outpatient Prescriptions  Medication Sig Dispense Refill  . budesonide (PULMICORT) 180 MCG/ACT inhaler Inhale 1 puff into the lungs 2 (two) times daily.    Marland Kitchen CALCIUM PO Take 1 tablet by mouth daily.    . citalopram (CELEXA) 40 MG tablet Take 40 mg by mouth daily.    . cyclobenzaprine (FLEXERIL) 10 MG tablet Take 1 tablet (10 mg total) by mouth 3 (three) times daily. 30 tablet 0  . hydrOXYzine (VISTARIL) 25 MG capsule Take 25 mg by mouth 3 (three) times daily as needed for anxiety.     . IRON PO Take 65 mg by mouth.    . mirtazapine (REMERON) 30 MG tablet Take 30 mg by mouth at bedtime.    . Multiple Vitamin (MULTIVITAMIN WITH MINERALS) TABS tablet Take 1 tablet by mouth daily.    . nitroGLYCERIN (NITROSTAT) 0.4 MG SL tablet Place 0.4 mg under the tongue every 5 (five) minutes as needed for chest pain.    Marland Kitchen oxyCODONE-acetaminophen (PERCOCET/ROXICET) 5-325 MG per tablet Take 1 tablet by mouth every 6 (six) hours as needed. 30 tablet 0  . propranolol (INDERAL) 80 MG tablet Take 80 mg by mouth 2 (two) times daily.    Marland Kitchen zinc gluconate 50 MG tablet Take 50 mg by mouth daily.     No current facility-administered medications for this  visit.     Past Surgical History  Procedure Laterality Date  . Left arm       Allergies  Allergen Reactions  . Aspirin     REACTION: Stomach upset      Family History  Problem Relation Age of Onset  . Heart failure Father   . Hypertension Father   . Stroke Father      Social History [name] reports that she has been smoking Cigarettes.  She has a 14 pack-year smoking history. She has never used smokeless tobacco. [name] reports that she does not drink alcohol.   Review of Systems CONSTITUTIONAL: No weight loss, fever, chills, weakness or fatigue.  HEENT: Eyes: No visual loss, blurred vision, double vision or yellow sclerae.No hearing loss, sneezing, congestion, runny nose or sore throat.  SKIN: No rash or itching.  CARDIOVASCULAR: per HPI RESPIRATORY: No shortness of breath, cough or sputum.  GASTROINTESTINAL: No anorexia, nausea, vomiting or diarrhea. No abdominal pain or blood.  GENITOURINARY: No burning on urination, no polyuria NEUROLOGICAL: No headache, dizziness, syncope, paralysis, ataxia, numbness or tingling in the extremities. No change in bowel or bladder control.  MUSCULOSKELETAL: No muscle, back pain, joint pain or stiffness.  LYMPHATICS: No enlarged nodes.  No history of splenectomy.  PSYCHIATRIC: No history of depression or anxiety.  ENDOCRINOLOGIC: No reports of sweating, cold or heat intolerance. No polyuria or polydipsia.  Marland Kitchen   Physical Examination p 77 bp 136/82 Wt 130 lbs BMI 22 Gen: resting comfortably, no acute distress HEENT: no scleral icterus, pupils equal round and reactive, no palptable cervical adenopathy,  CV: RRR, no m/r/g, no JVD, no carotid bruits Resp: Clear to auscultation bilaterally GI: abdomen is soft, non-tender, non-distended, normal bowel sounds, no hepatosplenomegaly MSK: extremities are warm, no edema.  Skin: warm, no rash Neuro:  no focal deficits Psych: appropriate affect   Diagnostic Studies 04/2014  MPI IMPRESSION: 1. No reversible ischemia or infarction.  2. Normal left ventricular wall motion.  3. Left ventricular ejection fraction 67%  4. Low-risk stress test findings*.    Assessment and Plan   1. Chest pain - negative nuclear stress test - isolated atypical episode since last visit - no evidence of cardiac etiology, no further cardiac testing planned at this time. Non-cardiac chest pain management per primary  2. HTN - at goal, continue current meds   F/u as needed    Arnoldo Lenis, M.D

## 2014-11-20 ENCOUNTER — Emergency Department (HOSPITAL_COMMUNITY): Payer: Self-pay

## 2014-11-20 ENCOUNTER — Emergency Department (HOSPITAL_COMMUNITY)
Admission: EM | Admit: 2014-11-20 | Discharge: 2014-11-20 | Disposition: A | Discharge status: Home / Self Care | Payer: Self-pay | Attending: Emergency Medicine | Admitting: Emergency Medicine

## 2014-11-20 ENCOUNTER — Encounter (HOSPITAL_COMMUNITY): Payer: Self-pay | Admitting: *Deleted

## 2014-11-20 DIAGNOSIS — S90112A Contusion of left great toe without damage to nail, initial encounter: Secondary | ICD-10-CM | POA: Insufficient documentation

## 2014-11-20 DIAGNOSIS — J449 Chronic obstructive pulmonary disease, unspecified: Secondary | ICD-10-CM | POA: Insufficient documentation

## 2014-11-20 DIAGNOSIS — W1830XA Fall on same level, unspecified, initial encounter: Secondary | ICD-10-CM | POA: Insufficient documentation

## 2014-11-20 DIAGNOSIS — Y9389 Activity, other specified: Secondary | ICD-10-CM | POA: Insufficient documentation

## 2014-11-20 DIAGNOSIS — Y9289 Other specified places as the place of occurrence of the external cause: Secondary | ICD-10-CM | POA: Insufficient documentation

## 2014-11-20 DIAGNOSIS — Y998 Other external cause status: Secondary | ICD-10-CM | POA: Insufficient documentation

## 2014-11-20 DIAGNOSIS — Z8701 Personal history of pneumonia (recurrent): Secondary | ICD-10-CM | POA: Insufficient documentation

## 2014-11-20 DIAGNOSIS — W19XXXA Unspecified fall, initial encounter: Secondary | ICD-10-CM

## 2014-11-20 DIAGNOSIS — I509 Heart failure, unspecified: Secondary | ICD-10-CM | POA: Insufficient documentation

## 2014-11-20 DIAGNOSIS — I10 Essential (primary) hypertension: Secondary | ICD-10-CM | POA: Insufficient documentation

## 2014-11-20 DIAGNOSIS — Z79899 Other long term (current) drug therapy: Secondary | ICD-10-CM | POA: Insufficient documentation

## 2014-11-20 DIAGNOSIS — Z8659 Personal history of other mental and behavioral disorders: Secondary | ICD-10-CM | POA: Insufficient documentation

## 2014-11-20 DIAGNOSIS — Z720 Tobacco use: Secondary | ICD-10-CM | POA: Insufficient documentation

## 2014-11-20 DIAGNOSIS — Z7951 Long term (current) use of inhaled steroids: Secondary | ICD-10-CM | POA: Insufficient documentation

## 2014-11-20 HISTORY — DX: Pneumonia, unspecified organism: J18.9

## 2014-11-20 MED ORDER — TRAMADOL HCL 50 MG PO TABS
50.0000 mg | ORAL_TABLET | Freq: Once | ORAL | Status: AC
  Administered 2014-11-20: 50 mg via ORAL
  Filled 2014-11-20: qty 1

## 2014-11-20 NOTE — ED Provider Notes (Signed)
  Face-to-face evaluation   History: Injury, left great toe and fall secondary to poor balance. No other injuries.   Physical exam: Alert patient who appears older than stated age. Left great toe has mild ecchymosis in the IP area without swelling or deformity.  Medical screening examination/treatment/procedure(s) were conducted as a shared visit with non-physician practitioner(s) and myself.  I personally evaluated the patient during the encounter  Daleen Bo, MD 11/21/14 1452

## 2014-11-20 NOTE — ED Provider Notes (Signed)
CSN: [id number]     Arrival date & time 11/20/14  1914 History   First MD Initiated Contact with Patient 11/20/14 1952     Chief Complaint  Patient presents with  . Fall     (Consider location/radiation/quality/duration/timing/severity/associated sxs/prior Treatment) Patient is a 64 y.o. female presenting with fall.  Fall This is a new problem. The current episode started today. The symptoms are aggravated by standing and walking.   Cynthia [name] is a 64 y.o. female who presents to the ED with pain to the left foot. She states that she was sitting on the side of her bed and lost her balance and fell. States that she always has poor balance.  She caught her left big toe on something and now it is very painful. She denies any other injuries.   Past Medical History  Diagnosis Date  . Migraine   . Hypertension   . Bronchitis   . High blood cholesterol level   . PTSD (post-traumatic stress disorder)   . COPD (chronic obstructive pulmonary disease)   . Pneumonia   . CHF (congestive heart failure)    Past Surgical History  Procedure Laterality Date  . Left arm    . Tonsillectomy     Family History  Problem Relation Age of Onset  . Heart failure Father   . Hypertension Father   . Stroke Father    History  Substance Use Topics  . Smoking status: Current Every Day Smoker -- 0.25 packs/day for 28 years    Types: Cigarettes    Start date: 09/29/1972  . Smokeless tobacco: Never Used     Comment: 3 ciggs per day  . Alcohol Use: No   OB History    Gravida Para Term Preterm AB TAB SAB Ectopic Multiple Living   '2 1   1          '$ Review of Systems Negative except as stated in HPI   Allergies  Aspirin  Home Medications   Prior to Admission medications   Medication Sig Start Date End Date Taking? Authorizing Provider  budesonide (PULMICORT) 180 MCG/ACT inhaler Inhale 1 puff into the lungs 2 (two) times daily.   Yes Historical Provider, MD  CALCIUM PO Take 1 tablet by  mouth daily.   Yes Historical Provider, MD  clorazepate (TRANXENE) 7.5 MG tablet Take 7.5 mg by mouth 2 (two) times daily as needed for anxiety.   Yes Historical Provider, MD  ferrous sulfate 325 (65 FE) MG tablet Take 325 mg by mouth daily with breakfast.   Yes Historical Provider, MD  FLUoxetine (PROZAC) 40 MG capsule Take 40 mg by mouth daily.   Yes Historical Provider, MD  Multiple Vitamin (MULTIVITAMIN WITH MINERALS) TABS tablet Take 1 tablet by mouth daily.   Yes Historical Provider, MD  nitroGLYCERIN (NITROSTAT) 0.4 MG SL tablet Place 0.4 mg under the tongue every 5 (five) minutes as needed for chest pain.   Yes Historical Provider, MD  propranolol (INDERAL) 80 MG tablet Take 80 mg by mouth 2 (two) times daily.   Yes Historical Provider, MD  simvastatin (ZOCOR) 20 MG tablet Take 20 mg by mouth at bedtime. 11/16/14  Yes Historical Provider, MD  traZODone (DESYREL) 100 MG tablet Take 50-100 mg by mouth at bedtime as needed for sleep.    Yes Historical Provider, MD  zinc gluconate 50 MG tablet Take 50 mg by mouth daily.   Yes Historical Provider, MD   BP 143/80 mmHg  Pulse 75  Temp(Src) 98.3 F (36.8 C) (Oral)  Resp 18  Wt 116 lb (52.617 kg)  SpO2 100% Physical Exam  Constitutional: She is oriented to person, place, and time. She appears well-developed and well-nourished.  HENT:  Head: Normocephalic.  Eyes: Conjunctivae and EOM are normal.  Neck: Neck supple.  Cardiovascular: Normal rate.   Pulmonary/Chest: Effort normal.  Musculoskeletal:       Left foot: There is decreased range of motion, tenderness and swelling. There is no laceration.       Feet:  Pedal pulses 2+ bilateral, adequate circulation, good touch sensation.   Neurological: She is alert and oriented to person, place, and time. No cranial nerve deficit.  Skin: Skin is warm and dry.  Psychiatric: She has a normal mood and affect. Her behavior is normal.  Nursing note and vitals reviewed.   ED Course  Procedures  (including critical care time) Labs Review Labs Reviewed - No data to display  Imaging Review Dg Foot Complete Left  11/20/2014   CLINICAL DATA:  Status post fall, with diffuse left foot pain, particularly at the left great toe. Initial encounter.  EXAM: LEFT FOOT - COMPLETE 3+ VIEW  COMPARISON:  None.  FINDINGS: There is no evidence of fracture or dislocation. The joint spaces are preserved. There is no evidence of talar subluxation; the subtalar joint is unremarkable in appearance.  No significant soft tissue abnormalities are seen.  IMPRESSION: No evidence of fracture or dislocation.   Electronically Signed   By: Garald Balding M.D.   On: 11/20/2014 21:06    MDM  64 y.o. female with pain and swelling of the left great toe s/p fall. Stable for d/c without fracture, dislocation or neurovascular compromise. Buddy tape, post op shoe, pain management.  Discussed with the patient and all questioned fully answered. She will return if any problems arise.  Final diagnoses:  Contusion of left great toe without damage to nail, initial encounter       [facility], NP 11/21/14 5465  Daleen Bo, MD 11/21/14 1452

## 2014-11-20 NOTE — Discharge Instructions (Signed)
Take tylenol for pain, apply ice pack to the area, elevate, follow up with your doctor. Return here as needed.   Buddy Taping of Toes We have taped your toes together to keep them from moving. This is called "buddy taping" since we used a part of your own body to keep the injured part still. We placed soft padding between your toes to keep them from rubbing against each other. Buddy taping will help with healing and to reduce pain. Keep your toes buddy taped together for as long as directed by your caregiver. HOME CARE INSTRUCTIONS   Raise your injured area above the level of your heart while sitting or lying down. Prop it up with pillows.  An ice pack used every twenty minutes, while awake, for the first one to two days may be helpful. Put ice in a plastic bag and put a towel between the bag and your skin.  Watch for signs that the taping is too tight. These signs may be:  Numbness of your taped toes.  Coolness of your taped toes.  Color change in the area beyond the tape.  Increased pain.  If you have any of these signs, loosen or rewrap the tape. If you need to loosen or rewrap the buddy tape, make sure you use the padding again. SEEK IMMEDIATE MEDICAL CARE IF:   You have worse pain, swelling, inflammation (soreness), drainage or bleeding after you rewrap the tape.  Any new problems occur. MAKE SURE YOU:   Understand these instructions.  Will watch your condition.  Will get help right away if you are not doing well or get worse. Document Released: 03/30/2004 Document Revised: 09/18/2011 Document Reviewed: 06/23/2008 [facility] Patient Information 2015 [place], [place]. This information is not intended to replace advice given to you by your health care provider. Make sure you discuss any questions you have with your health care provider.  Contusion A contusion is a deep bruise. Contusions are the result of an injury that caused bleeding under the skin. The contusion may turn blue,  purple, or yellow. Minor injuries will give you a painless contusion, but more severe contusions may stay painful and swollen for a few weeks.  CAUSES  A contusion is usually caused by a blow, trauma, or direct force to an area of the body. SYMPTOMS   Swelling and redness of the injured area.  Bruising of the injured area.  Tenderness and soreness of the injured area.  Pain. DIAGNOSIS  The diagnosis can be made by taking a history and physical exam. An X-ray, CT scan, or MRI may be needed to determine if there were any associated injuries, such as fractures. TREATMENT  Specific treatment will depend on what area of the body was injured. In general, the best treatment for a contusion is resting, icing, elevating, and applying cold compresses to the injured area. Over-the-counter medicines may also be recommended for pain control. Ask your caregiver what the best treatment is for your contusion. HOME CARE INSTRUCTIONS   Put ice on the injured area.  Put ice in a plastic bag.  Place a towel between your skin and the bag.  Leave the ice on for 15-20 minutes, 3-4 times a day, or as directed by your health care provider.  Only take over-the-counter or prescription medicines for pain, discomfort, or fever as directed by your caregiver. Your caregiver may recommend avoiding anti-inflammatory medicines (aspirin, ibuprofen, and naproxen) for 48 hours because these medicines may increase bruising.  Rest the injured area.  If possible, elevate the injured area to reduce swelling. SEEK IMMEDIATE MEDICAL CARE IF:   You have increased bruising or swelling.  You have pain that is getting worse.  Your swelling or pain is not relieved with medicines. MAKE SURE YOU:   Understand these instructions.  Will watch your condition.  Will get help right away if you are not doing well or get worse. Document Released: 04/05/2005 Document Revised: 07/01/2013 Document Reviewed: 05/01/2011 [facility]  Patient Information 2015 [place], [place]. This information is not intended to replace advice given to you by your health care provider. Make sure you discuss any questions you have with your health care provider.

## 2014-11-20 NOTE — ED Notes (Addendum)
Pt was sitting on side of bed and fell off onto floor "my balance is not good"  Pain lt great toe

## 2014-12-09 ENCOUNTER — Other Ambulatory Visit (HOSPITAL_COMMUNITY): Payer: Self-pay | Admitting: Physician Assistant

## 2014-12-09 ENCOUNTER — Ambulatory Visit (HOSPITAL_COMMUNITY)
Admission: RE | Admit: 2014-12-09 | Discharge: 2014-12-09 | Disposition: A | Discharge status: Home / Self Care | Payer: Self-pay | Source: Ambulatory Visit | Attending: Physician Assistant | Admitting: Physician Assistant

## 2014-12-09 DIAGNOSIS — R630 Anorexia: Secondary | ICD-10-CM | POA: Insufficient documentation

## 2014-12-09 DIAGNOSIS — R634 Abnormal weight loss: Secondary | ICD-10-CM | POA: Insufficient documentation

## 2014-12-09 DIAGNOSIS — F17200 Nicotine dependence, unspecified, uncomplicated: Secondary | ICD-10-CM

## 2014-12-17 LAB — FECAL OCCULT BLOOD, GUAIAC: FECAL OCCULT BLD: NEGATIVE

## 2015-01-15 ENCOUNTER — Other Ambulatory Visit: Payer: Self-pay

## 2015-01-15 ENCOUNTER — Ambulatory Visit (INDEPENDENT_AMBULATORY_CARE_PROVIDER_SITE_OTHER): Payer: Self-pay | Admitting: Gastroenterology

## 2015-01-15 ENCOUNTER — Encounter: Payer: Self-pay | Admitting: Gastroenterology

## 2015-01-15 VITALS — BP 124/75 | HR 78 | Temp 98.2°F | Ht 65.0 in | Wt 113.8 lb

## 2015-01-15 DIAGNOSIS — B182 Chronic viral hepatitis C: Secondary | ICD-10-CM

## 2015-01-15 DIAGNOSIS — Z800 Family history of malignant neoplasm of digestive organs: Secondary | ICD-10-CM

## 2015-01-15 DIAGNOSIS — R1314 Dysphagia, pharyngoesophageal phase: Secondary | ICD-10-CM

## 2015-01-15 DIAGNOSIS — D649 Anemia, unspecified: Secondary | ICD-10-CM

## 2015-01-15 DIAGNOSIS — R634 Abnormal weight loss: Secondary | ICD-10-CM

## 2015-01-15 DIAGNOSIS — Z8619 Personal history of other infectious and parasitic diseases: Secondary | ICD-10-CM | POA: Insufficient documentation

## 2015-01-15 MED ORDER — OMEPRAZOLE 20 MG PO CPDR: 20.0000 mg | DELAYED_RELEASE_CAPSULE | Freq: Every day | ORAL | Status: DC

## 2015-01-15 NOTE — Assessment & Plan Note (Signed)
Normocytic anemia with Hgb 11. Check iron, TIBC, ferritin. Colonoscopy/EGD planned. No overt signs of GI bleeding.

## 2015-01-15 NOTE — Progress Notes (Signed)
Primary Care Physician:  [facility] Primary Gastroenterologist:  Dr. Oneida Alar   Chief Complaint  Patient presents with  . Hepatitis C    HPI:   Cynthia [name] is a 64 y.o. female presenting today at the request of Soyla Dryer, PA secondary to Hep C with positive RNA. No genotype completed.   History of smoking crack cocaine. No IV drug use. No history of blood transfusions. No tattoos.   No prior colonoscopy or upper endoscopy. No rectal bleeding. Sometimes lower abdominal discomfort associated with constipation. "stays constipated". BM usually 2-3 times per week. Uses prune juice. Doesn't feel productive. Doesn't have a good appetite. Noted weight loss of about 20 lbs in last 2 months. Documented weight in Jan 2016 130, now 113. States under stress. Notes solid food dysphagia for past several months. No pill dysphagia. Intermittent heartburn.   Past Medical History  Diagnosis Date  . Migraine   . Hypertension   . Bronchitis   . High blood cholesterol level   . PTSD (post-traumatic stress disorder)   . COPD (chronic obstructive pulmonary disease)   . Pneumonia   . CHF (congestive heart failure)     Past Surgical History  Procedure Laterality Date  . Left arm    . Tonsillectomy      Current Outpatient Prescriptions  Medication Sig Dispense Refill  . albuterol (PROVENTIL) (2.5 MG/3ML) 0.083% nebulizer solution Take 2.5 mg by nebulization every 6 (six) hours as needed for wheezing or shortness of breath.    . budesonide (PULMICORT) 180 MCG/ACT inhaler Inhale 1 puff into the lungs 2 (two) times daily.    Marland Kitchen CALCIUM PO Take 1 tablet by mouth daily.    . clorazepate (TRANXENE) 7.5 MG tablet Take 7.5 mg by mouth 2 (two) times daily as needed for anxiety.    . ferrous sulfate 325 (65 FE) MG tablet Take 325 mg by mouth daily with breakfast.    . FLUoxetine (PROZAC) 40 MG capsule Take 40 mg by mouth daily.    Marland Kitchen LORazepam (ATIVAN) 0.5 MG tablet Take 0.5 mg by mouth  as needed for anxiety.    . Multiple Vitamin (MULTIVITAMIN WITH MINERALS) TABS tablet Take 1 tablet by mouth daily.    . nitroGLYCERIN (NITROSTAT) 0.4 MG SL tablet Place 0.4 mg under the tongue every 5 (five) minutes as needed for chest pain.    Marland Kitchen propranolol (INDERAL) 80 MG tablet Take 80 mg by mouth 2 (two) times daily.    . simvastatin (ZOCOR) 20 MG tablet Take 20 mg by mouth at bedtime.  4  . traZODone (DESYREL) 100 MG tablet Take 50-100 mg by mouth at bedtime as needed for sleep.     Marland Kitchen zinc gluconate 50 MG tablet Take 50 mg by mouth daily.     No current facility-administered medications for this visit.    Allergies as of 01/15/2015 - Review Complete 01/15/2015  Allergen Reaction Noted  . Aspirin  11/11/2008    Family History  Problem Relation Age of Onset  . Heart failure Father   . Hypertension Father   . Stroke Father   . Colon cancer Father     History   Social History  . Marital Status: Married    Spouse Name: N/A  . Number of Children: N/A  . Years of Education: N/A   Occupational History  . Not on file.   Social History Main Topics  . Smoking status: Current Every Day Smoker -- 0.25 packs/day  for 28 years    Types: Cigarettes    Start date: 09/29/1972  . Smokeless tobacco: Never Used     Comment: 3 ciggs per day  . Alcohol Use: No  . Drug Use: No  . Sexual Activity: Not on file   Other Topics Concern  . Not on file   Social History Narrative    Review of Systems: Gen: see HPI CV: Denies chest pain, heart palpitations, peripheral edema, syncope.  Resp: +SOb, +DOE at times  GI: see HPI GU : Denies urinary burning, urinary frequency, urinary hesitancy MS: +joint pain Derm: Denies rash, itching, dry skin Psych: +depression.anxiety Heme: Denies bruising, bleeding, and enlarged lymph nodes.  Physical Exam: BP 124/75 mmHg  Pulse 78  Temp(Src) 98.2 F (36.8 C)  Ht '5\' 5"'$  (1.651 m)  Wt 113 lb 12.8 oz (51.619 kg)  BMI 18.94 kg/m2 General:   Alert  and oriented. Tearful. ' Head:  Normocephalic and atraumatic. Eyes:  Without icterus, sclera clear and conjunctiva pink.  Ears:  Normal auditory acuity. Nose:  No deformity, discharge,  or lesions. Mouth:  No deformity or lesions, oral mucosa pink.  Lungs:  Clear to auscultation bilaterally. No wheezes, rales, or rhonchi. No distress.  Heart:  S1, S2 present without murmurs appreciated.  Abdomen:  +BS, soft, non-tender and non-distended. No HSM noted. No guarding or rebound. No masses appreciated.  Rectal:  Deferred  Msk:  Symmetrical without gross deformities. Normal posture. Extremities:  Without  edema. Neurologic:  Alert and  oriented x4;  grossly normal neurologically. Psych:  Alert and cooperative. Tearful but pleasant.   Labs May 2016:  Hep C antibody positive with RNA log 5.33.  Hgb 11, Hct 35, normocytic LFTs normal

## 2015-01-15 NOTE — Assessment & Plan Note (Signed)
Noted family history of colon cancer in father, unknown age. No rectal bleeding noted but complains of chronic constipation and associated mild abdominal discomfort. Start Miralax one capful each evening. Needs initial screening colonoscopy.   Proceed with colonoscopy with Dr. Oneida Alar in the near future. The risks, benefits, and alternatives have been discussed in detail with the patient. They state understanding and desire to proceed.  PROPOFOL due to polypharmacy

## 2015-01-15 NOTE — Patient Instructions (Signed)
Please have ultrasound and labs done.   For constipation: take 1 capful of Miralax each evening to have a bowel movement. This is over the counter.   For reflux: take Prilosec one capsule each morning, 30 minutes before breakfast. I have sent a prescription.  We have scheduled you for a colonoscopy, upper endoscopy, and dilation if needed with Dr. Oneida Alar.   Further recommendations to follow!

## 2015-01-15 NOTE — Assessment & Plan Note (Signed)
Likely multifactorial. Work-up in progress for treatment of Hep C. Colonoscopy/EGD/dilation planned. May need further testing depending on results of work-up.

## 2015-01-15 NOTE — Assessment & Plan Note (Signed)
64 year old female with chronic Hep C, treatment-naive, unknown genotype. No known liver disease. Needs genotype, elastography, and further recommendations to follow. Will review labs. Will need Hep A and B vaccination as not immune. Obtaining cone assistance currently.

## 2015-01-15 NOTE — Assessment & Plan Note (Signed)
Solid food dysphagia for several months in the setting of GERD. Start Prilosec once daily. Query uncontrolled GERD, web, ring, stricture. Less likely malignancy. Weight loss concerning as well.   Proceed with upper endoscopy and dilation in the near future with Dr. Oneida Alar. The risks, benefits, and alternatives have been discussed in detail with patient. They have stated understanding and desire to proceed.  PROPOFOL due to polypharmacy Prilosec sent to pharmacy

## 2015-01-25 ENCOUNTER — Ambulatory Visit (HOSPITAL_COMMUNITY)
Admission: RE | Admit: 2015-01-25 | Discharge: 2015-01-25 | Disposition: A | Discharge status: Home / Self Care | Payer: Self-pay | Source: Ambulatory Visit | Attending: Gastroenterology | Admitting: Gastroenterology

## 2015-01-25 DIAGNOSIS — K760 Fatty (change of) liver, not elsewhere classified: Secondary | ICD-10-CM | POA: Insufficient documentation

## 2015-01-25 DIAGNOSIS — B182 Chronic viral hepatitis C: Secondary | ICD-10-CM | POA: Insufficient documentation

## 2015-02-02 NOTE — Patient Instructions (Signed)
[place]  02/02/2015     '@PREFPERIOPPHARMACY'$ @   Your procedure is scheduled on  02/09/2015   Report to [facility] at  38  A.M.  Call this number if you have problems the morning of surgery:  [phone]   Remember:  Do not eat food or drink liquids after midnight.  Take these medicines the morning of surgery with A SIP OF WATER  Tranxene, prozac, ativan, prilosec, propranolol. Take your nubulizer and your inhaler before you come.   Do not wear jewelry, make-up or nail polish.  Do not wear lotions, powders, or perfumes.    Do not shave 48 hours prior to surgery.  Men may shave face and neck.  Do not bring valuables to the hospital.  [facility] is not responsible for any belongings or valuables.  Contacts, dentures or bridgework may not be worn into surgery.  Leave your suitcase in the car.  After surgery it may be brought to your room.  For patients admitted to the hospital, discharge time will be determined by your treatment team.  Patients discharged the day of surgery will not be allowed to drive home.   Name and phone number of your driver:   Family Special instructions:  Follow your prep and diet instructions given to you by Dr Nona Dell office.  Please read over the following fact sheets that you were given. Pain Booklet, Coughing and Deep Breathing, Surgical Site Infection Prevention, Anesthesia Post-op Instructions and Care and Recovery After Surgery      Esophagogastroduodenoscopy Esophagogastroduodenoscopy (EGD) is a procedure to examine the lining of the esophagus, stomach, and first part of the small intestine (duodenum). A long, flexible, lighted tube with a camera attached (endoscope) is inserted down the throat to view these organs. This procedure is done to detect problems or abnormalities, such as inflammation, bleeding, ulcers, or growths, in order to treat them. The procedure lasts about 5-20 minutes. It is usually an outpatient  procedure, but it may need to be performed in emergency cases in the hospital. LET YOUR CAREGIVER KNOW ABOUT:   Allergies to food or medicine.  All medicines you are taking, including vitamins, herbs, eyedrops, and over-the-counter medicines and creams.  Use of steroids (by mouth or creams).  Previous problems you or members of your family have had with the use of anesthetics.  Any blood disorders you have.  Previous surgeries you have had.  Other health problems you have.  Possibility of pregnancy, if this applies. RISKS AND COMPLICATIONS  Generally, EGD is a safe procedure. However, as with any procedure, complications can occur. Possible complications include:  Infection.  Bleeding.  Tearing (perforation) of the esophagus, stomach, or duodenum.  Difficulty breathing or not being able to breath.  Excessive sweating.  Spasms of the larynx.  Slowed heartbeat.  Low blood pressure. BEFORE THE PROCEDURE  Do not eat or drink anything for 6-8 hours before the procedure or as directed by your caregiver.  Ask your caregiver about changing or stopping your regular medicines.  If you wear dentures, be prepared to remove them before the procedure.  Arrange for someone to drive you home after the procedure. PROCEDURE   A vein will be accessed to give medicines and fluids. A medicine to relax you (sedative) and a pain reliever will be given through that access into the vein.  A numbing medicine (local anesthetic) may be sprayed on your throat for comfort and to stop  you from gagging or coughing.  A mouth guard may be placed in your mouth to protect your teeth and to keep you from biting on the endoscope.  You will be asked to lie on your left side.  The endoscope is inserted down your throat and into the esophagus, stomach, and duodenum.  Air is put through the endoscope to allow your caregiver to view the lining of your esophagus clearly.  The esophagus, stomach, and  duodenum is then examined. During the exam, your caregiver may:  Remove tissue to be examined under a microscope (biopsy) for inflammation, infection, or other medical problems.  Remove growths.  Remove objects (foreign bodies) that are stuck.  Treat any bleeding with medicines or other devices that stop tissues from bleeding (hot cautery, clipping devices).  Widen (dilate) or stretch narrowed areas of the esophagus and stomach.  The endoscope will then be withdrawn. AFTER THE PROCEDURE  You will be taken to a recovery area to be monitored. You will be able to go home once you are stable and alert.  Do not eat or drink anything until the local anesthetic and numbing medicines have worn off. You may choke.  It is normal to feel bloated, have pain with swallowing, or have a sore throat for a short time. This will wear off.  Your caregiver should be able to discuss his or her findings with you. It will take longer to discuss the test results if any biopsies were taken. Document Released: 10/27/2004 Document Revised: 11/10/2013 Document Reviewed: 05/29/2012 [facility] Patient Information 2015 [place], [place]. This information is not intended to replace advice given to you by your health care provider. Make sure you discuss any questions you have with your health care provider. Esophageal Dilatation The esophagus is the long, narrow tube which carries food and liquid from the mouth to the stomach. Esophageal dilatation is the technique used to stretch a blocked or narrowed portion of the esophagus. This procedure is used when a part of the esophagus has become so narrow that it becomes difficult, painful or even impossible to swallow. This is generally an uncomplicated form of treatment. When this is not successful, chest surgery may be required. This is a much more extensive form of treatment with a longer recovery time. CAUSES  Some of the more common causes of blockage or strictures of the  esophagus are:  Narrowing from longstanding inflammation (soreness and redness) of the lower esophagus. This comes from the constant exposure of the lower esophagus to the acid which bubbles up from the stomach. Over time this causes scarring and narrowing of the lower esophagus.  Hiatal hernia in which a small part of the stomach bulges (herniates) up through the diaphragm. This can cause a gradual narrowing of the end of the esophagus.  Schatzki ring is a narrow ring of benign (non-cancerous) fibrous tissue which constricts the lower esophagus. The reason for this is not known.  Scleroderma is a connective tissue disorder that affects the esophagus and makes swallowing difficult.  Achalasia is an absence of nerves to the lower esophagus and to the esophageal sphincter. This is the circular muscle between the stomach and esophagus that relaxes to allow food into the stomach. After swallowing, it contracts to keep food in the stomach. This absence of nerves may be congenital (present since birth). This can cause irregular spasms of the lower esophageal muscle. This spasm does not open up to allow food and fluid through. The result is a persistent blockage with  subsequent slow trickling of the esophageal contents into the stomach.  Strictures may develop from swallowing materials which damage the esophagus. Some examples are strong acids or alkalis such as lye.  Growths such as benign (non-cancerous) and malignant (cancerous) tumors can block the esophagus.  Hereditary (present since birth) causes. DIAGNOSIS  Your caregiver often suspects this problem by taking a medical history. They will also do a physical exam. They can then prove their suspicions using X-rays and endoscopy. Endoscopy is an exam in which a tube like a small, flexible telescope is used to look at your esophagus.  TREATMENT There are different stretching (dilating) techniques that can be used. Simple bougie dilatation may be done  in the office. This usually takes only a couple minutes. A numbing (anesthetic) spray of the throat is used. Endoscopy, when done, is done in an endoscopy suite under mild sedation. When fluoroscopy is used, the procedure is performed in X-ray. Other techniques require a little longer time. Recovery is usually quick. There is no waiting time to begin eating and drinking to test success of the treatment. Following are some of the methods used. Narrowing of the esophagus is treated by making it bigger. Commonly this is a mechanical problem which can be treated with stretching. This can be done in different ways. Your caregiver will discuss these with you. Some of the means used are:  A series of graduated (increasing thickness) flexible dilators can be used. These are weighted tubes passed through the esophagus into the stomach. The tubes used become progressively larger until the desired stretched size is reached. Graduated dilators are a simple and quick way of opening the esophagus. No visualization is required.  Another method is the use of endoscopy to place a flexible wire across the stricture. The endoscope is removed and the wire left in place. A dilator with a hole through it from end to end is guided down the esophagus and across the stricture. One or more of these dilators are passed over the wire. At the end of the exam, the wire is removed. This type of treatment may be performed in the X-ray department under fluoroscopy. An advantage of this procedure is the examiner is visualizing the end opening in the esophagus.  Stretching of the esophagus may be done using balloons. Deflated balloons are placed through the endoscope and across the stricture. This type of balloon dilatation is often done at the time of endoscopy or fluoroscopy. Flexible endoscopy allows the examiner to directly view the stricture. A balloon is inserted in the deflated form into the area of narrowing. It is then inflated with  air to a certain pressure that is preset for a given circumference. When inflated, it becomes sausage shaped, stretched, and makes the stricture larger.  Achalasia requires a longer, larger balloon-type dilator. This is frequently done under X-ray control. In this situation, the spastic muscle fibers in the lower esophagus are stretched. All of the above procedures make the passage of food and water into the stomach easier. They also make it easier for stomach contents to reflux back into the esophagus. Special medications may be used following the procedure to help prevent further stricturing. Proton-pump inhibitor medications are good at decreasing the amount of acid in the stomach juice. When stomach juice refluxes into the esophagus, the juice is no longer as acidic and is less likely to burn or scar the esophagus. RISKS AND COMPLICATIONS Esophageal dilatation is usually performed effectively and without problems. Some complications that can  occur are:  A small amount of bleeding almost always happens where the stretching takes place. If this is too excessive it may require more aggressive treatment.  An uncommon complication is perforation (making a hole) of the esophagus. The esophagus is thin. It is easy to make a hole in it. If this happens, an operation may be necessary to repair this.  A small, undetected perforation could lead to an infection in the chest. This can be very serious. HOME CARE INSTRUCTIONS   If you received sedation for your procedure, do not drive, make important decisions, or perform any activities requiring your full coordination. Do not drink alcohol, take sedatives, or use any mind altering chemicals unless instructed by your caregiver.  You may use throat lozenges or warm salt water gargles if you have throat discomfort.  You can begin eating and drinking normally on return home unless instructed otherwise. Do not purposely try to force large chunks of food down to  test the benefits of your procedure.  Mild discomfort can be eased with sips of ice water.  Medications for discomfort may or may not be needed. SEEK IMMEDIATE MEDICAL CARE IF:   You begin vomiting up blood.  You develop black, tarry stools.  You develop chills or an unexplained temperature of over 101F (38.3C)  You develop chest or abdominal pain.  You develop shortness of breath, or feel light-headed or faint.  Your swallowing is becoming more painful, difficult, or you are unable to swallow. MAKE SURE YOU:   Understand these instructions.  Will watch your condition.  Will get help right away if you are not doing well or get worse. Document Released: 08/17/2005 Document Revised: 11/10/2013 Document Reviewed: 10/04/2005 [facility] Patient Information 2015 [place], [place]. This information is not intended to replace advice given to you by your health care provider. Make sure you discuss any questions you have with your health care provider. Colonoscopy A colonoscopy is an exam to look at the entire large intestine (colon). This exam can help find problems such as tumors, polyps, inflammation, and areas of bleeding. The exam takes about 1 hour.  LET [facility] CARE PROVIDER KNOW ABOUT:   Any allergies you have.  All medicines you are taking, including vitamins, herbs, eye drops, creams, and over-the-counter medicines.  Previous problems you or members of your family have had with the use of anesthetics.  Any blood disorders you have.  Previous surgeries you have had.  Medical conditions you have. RISKS AND COMPLICATIONS  Generally, this is a safe procedure. However, as with any procedure, complications can occur. Possible complications include:  Bleeding.  Tearing or rupture of the colon wall.  Reaction to medicines given during the exam.  Infection (rare). BEFORE THE PROCEDURE   Ask your health care provider about changing or stopping your regular  medicines.  You may be prescribed an oral bowel prep. This involves drinking a large amount of medicated liquid, starting the day before your procedure. The liquid will cause you to have multiple loose stools until your stool is almost clear or light green. This cleans out your colon in preparation for the procedure.  Do not eat or drink anything else once you have started the bowel prep, unless your health care provider tells you it is safe to do so.  Arrange for someone to drive you home after the procedure. PROCEDURE   You will be given medicine to help you relax (sedative).  You will lie on your side with your knees  bent.  A long, flexible tube with a light and camera on the end (colonoscope) will be inserted through the rectum and into the colon. The camera sends video back to a computer screen as it moves through the colon. The colonoscope also releases carbon dioxide gas to inflate the colon. This helps your health care provider see the area better.  During the exam, your health care provider may take a small tissue sample (biopsy) to be examined under a microscope if any abnormalities are found.  The exam is finished when the entire colon has been viewed. AFTER THE PROCEDURE   Do not drive for 24 hours after the exam.  You may have a small amount of blood in your stool.  You may pass moderate amounts of gas and have mild abdominal cramping or bloating. This is caused by the gas used to inflate your colon during the exam.  Ask when your test results will be ready and how you will get your results. Make sure you get your test results. Document Released: 06/23/2000 Document Revised: 04/16/2013 Document Reviewed: 03/03/2013 [facility] Patient Information 2015 [place], [place]. This information is not intended to replace advice given to you by your health care provider. Make sure you discuss any questions you have with your health care provider. PATIENT  INSTRUCTIONS POST-ANESTHESIA  IMMEDIATELY FOLLOWING SURGERY:  Do not drive or operate machinery for the first twenty four hours after surgery.  Do not make any important decisions for twenty four hours after surgery or while taking narcotic pain medications or sedatives.  If you develop intractable nausea and vomiting or a severe headache please notify your doctor immediately.  FOLLOW-UP:  Please make an appointment with your surgeon as instructed. You do not need to follow up with anesthesia unless specifically instructed to do so.  WOUND CARE INSTRUCTIONS (if applicable):  Keep a dry clean dressing on the anesthesia/puncture wound site if there is drainage.  Once the wound has quit draining you may leave it open to air.  Generally you should leave the bandage intact for twenty four hours unless there is drainage.  If the epidural site drains for more than 36-48 hours please call the anesthesia department.  QUESTIONS?:  Please feel free to call your physician or the hospital operator if you have any questions, and they will be happy to assist you.

## 2015-02-03 ENCOUNTER — Other Ambulatory Visit: Payer: Self-pay

## 2015-02-03 ENCOUNTER — Encounter (HOSPITAL_COMMUNITY): Payer: Self-pay

## 2015-02-03 ENCOUNTER — Encounter (HOSPITAL_COMMUNITY)
Admission: RE | Admit: 2015-02-03 | Discharge: 2015-02-03 | Disposition: A | Discharge status: Home / Self Care | Payer: Self-pay | Source: Ambulatory Visit | Attending: Gastroenterology | Admitting: Gastroenterology

## 2015-02-03 DIAGNOSIS — Z800 Family history of malignant neoplasm of digestive organs: Secondary | ICD-10-CM | POA: Insufficient documentation

## 2015-02-03 DIAGNOSIS — Z01818 Encounter for other preprocedural examination: Secondary | ICD-10-CM | POA: Insufficient documentation

## 2015-02-03 DIAGNOSIS — D649 Anemia, unspecified: Secondary | ICD-10-CM | POA: Insufficient documentation

## 2015-02-03 DIAGNOSIS — R1310 Dysphagia, unspecified: Secondary | ICD-10-CM | POA: Insufficient documentation

## 2015-02-03 HISTORY — DX: Angina pectoris, unspecified: I20.9

## 2015-02-03 HISTORY — DX: Gastro-esophageal reflux disease without esophagitis: K21.9

## 2015-02-03 LAB — CBC WITH DIFFERENTIAL/PLATELET
BASOS ABS: 0 10*3/uL (ref 0.0–0.1)
Basophils Relative: 0 % (ref 0–1)
Eosinophils Absolute: 0.2 10*3/uL (ref 0.0–0.7)
Eosinophils Relative: 2 % (ref 0–5)
HEMATOCRIT: 30.1 % — AB (ref 36.0–46.0)
HEMOGLOBIN: 9.8 g/dL — AB (ref 12.0–15.0)
LYMPHS ABS: 1.7 10*3/uL (ref 0.7–4.0)
Lymphocytes Relative: 19 % (ref 12–46)
MCH: 27.1 pg (ref 26.0–34.0)
MCHC: 32.6 g/dL (ref 30.0–36.0)
MCV: 83.1 fL (ref 78.0–100.0)
Monocytes Absolute: 0.5 10*3/uL (ref 0.1–1.0)
Monocytes Relative: 6 % (ref 3–12)
NEUTROS ABS: 6.5 10*3/uL (ref 1.7–7.7)
Neutrophils Relative %: 73 % (ref 43–77)
PLATELETS: 358 10*3/uL (ref 150–400)
RBC: 3.62 MIL/uL — ABNORMAL LOW (ref 3.87–5.11)
RDW: 16.5 % — ABNORMAL HIGH (ref 11.5–15.5)
WBC: 8.9 10*3/uL (ref 4.0–10.5)

## 2015-02-03 LAB — BASIC METABOLIC PANEL
Anion gap: 7 (ref 5–15)
BUN: 14 mg/dL (ref 6–20)
CO2: 30 mmol/L (ref 22–32)
CREATININE: 0.78 mg/dL (ref 0.44–1.00)
Calcium: 9.5 mg/dL (ref 8.9–10.3)
Chloride: 103 mmol/L (ref 101–111)
Glucose, Bld: 94 mg/dL (ref 65–99)
Potassium: 4.3 mmol/L (ref 3.5–5.1)
Sodium: 140 mmol/L (ref 135–145)

## 2015-02-03 NOTE — Progress Notes (Signed)
REVIEWED-NO ADDITIONAL RECOMMENDATIONS. 

## 2015-02-03 NOTE — Pre-Procedure Instructions (Signed)
Patient given information to sign up for my chart at home. 

## 2015-02-09 ENCOUNTER — Ambulatory Visit (HOSPITAL_COMMUNITY): Payer: Self-pay | Admitting: Anesthesiology

## 2015-02-09 ENCOUNTER — Encounter (HOSPITAL_COMMUNITY)
Admission: RE | Disposition: A | Discharge status: Home / Self Care | Payer: Self-pay | Source: Ambulatory Visit | Attending: Gastroenterology

## 2015-02-09 ENCOUNTER — Encounter (HOSPITAL_COMMUNITY): Payer: Self-pay

## 2015-02-09 ENCOUNTER — Ambulatory Visit (HOSPITAL_COMMUNITY)
Admission: RE | Admit: 2015-02-09 | Discharge: 2015-02-09 | Disposition: A | Discharge status: Home / Self Care | Payer: Self-pay | Source: Ambulatory Visit | Attending: Gastroenterology | Admitting: Gastroenterology

## 2015-02-09 DIAGNOSIS — D649 Anemia, unspecified: Secondary | ICD-10-CM | POA: Insufficient documentation

## 2015-02-09 DIAGNOSIS — K5900 Constipation, unspecified: Secondary | ICD-10-CM | POA: Insufficient documentation

## 2015-02-09 DIAGNOSIS — R634 Abnormal weight loss: Secondary | ICD-10-CM | POA: Insufficient documentation

## 2015-02-09 DIAGNOSIS — J40 Bronchitis, not specified as acute or chronic: Secondary | ICD-10-CM | POA: Insufficient documentation

## 2015-02-09 DIAGNOSIS — K2950 Unspecified chronic gastritis without bleeding: Secondary | ICD-10-CM | POA: Insufficient documentation

## 2015-02-09 DIAGNOSIS — K648 Other hemorrhoids: Secondary | ICD-10-CM | POA: Insufficient documentation

## 2015-02-09 DIAGNOSIS — K222 Esophageal obstruction: Secondary | ICD-10-CM

## 2015-02-09 DIAGNOSIS — K2970 Gastritis, unspecified, without bleeding: Secondary | ICD-10-CM

## 2015-02-09 DIAGNOSIS — F17210 Nicotine dependence, cigarettes, uncomplicated: Secondary | ICD-10-CM | POA: Insufficient documentation

## 2015-02-09 DIAGNOSIS — F4310 Post-traumatic stress disorder, unspecified: Secondary | ICD-10-CM | POA: Insufficient documentation

## 2015-02-09 DIAGNOSIS — R1310 Dysphagia, unspecified: Secondary | ICD-10-CM | POA: Insufficient documentation

## 2015-02-09 DIAGNOSIS — B9681 Helicobacter pylori [H. pylori] as the cause of diseases classified elsewhere: Secondary | ICD-10-CM | POA: Insufficient documentation

## 2015-02-09 DIAGNOSIS — K921 Melena: Secondary | ICD-10-CM

## 2015-02-09 DIAGNOSIS — I10 Essential (primary) hypertension: Secondary | ICD-10-CM | POA: Insufficient documentation

## 2015-02-09 DIAGNOSIS — Z800 Family history of malignant neoplasm of digestive organs: Secondary | ICD-10-CM | POA: Insufficient documentation

## 2015-02-09 DIAGNOSIS — R109 Unspecified abdominal pain: Secondary | ICD-10-CM | POA: Insufficient documentation

## 2015-02-09 DIAGNOSIS — J449 Chronic obstructive pulmonary disease, unspecified: Secondary | ICD-10-CM | POA: Insufficient documentation

## 2015-02-09 HISTORY — PX: BIOPSY: SHX5522

## 2015-02-09 HISTORY — PX: ESOPHAGOGASTRODUODENOSCOPY (EGD) WITH PROPOFOL: SHX5813

## 2015-02-09 HISTORY — PX: COLONOSCOPY WITH PROPOFOL: SHX5780

## 2015-02-09 HISTORY — PX: SAVORY DILATION: SHX5439

## 2015-02-09 LAB — RAPID URINE DRUG SCREEN, HOSP PERFORMED
Amphetamines: NOT DETECTED
BARBITURATES: NOT DETECTED
BENZODIAZEPINES: POSITIVE — AB
COCAINE: NOT DETECTED
Opiates: NOT DETECTED
TETRAHYDROCANNABINOL: NOT DETECTED

## 2015-02-09 SURGERY — COLONOSCOPY WITH PROPOFOL
Anesthesia: Monitor Anesthesia Care

## 2015-02-09 MED ORDER — FENTANYL CITRATE (PF) 100 MCG/2ML IJ SOLN
25.0000 ug | INTRAMUSCULAR | Status: AC
  Administered 2015-02-09 (×2): 25 ug via INTRAVENOUS

## 2015-02-09 MED ORDER — FENTANYL CITRATE (PF) 100 MCG/2ML IJ SOLN: 25.0000 ug | INTRAMUSCULAR | Status: DC | PRN

## 2015-02-09 MED ORDER — ONDANSETRON HCL 4 MG/2ML IJ SOLN: 4.0000 mg | Freq: Once | INTRAMUSCULAR | Status: DC | PRN

## 2015-02-09 MED ORDER — WATER FOR IRRIGATION, STERILE IR SOLN
Status: DC | PRN
  Administered 2015-02-09: 1000 mL

## 2015-02-09 MED ORDER — LIDOCAINE VISCOUS 2 % MT SOLN
15.0000 mL | Freq: Once | OROMUCOSAL | Status: AC
  Administered 2015-02-09: 15 mL via OROMUCOSAL

## 2015-02-09 MED ORDER — LIDOCAINE VISCOUS 2 % MT SOLN
OROMUCOSAL | Status: AC
  Filled 2015-02-09: qty 15

## 2015-02-09 MED ORDER — LACTATED RINGERS IV SOLN
INTRAVENOUS | Status: DC
  Administered 2015-02-09: 11:00:00 via INTRAVENOUS

## 2015-02-09 MED ORDER — MIDAZOLAM HCL 2 MG/2ML IJ SOLN
INTRAMUSCULAR | Status: AC
  Filled 2015-02-09: qty 2

## 2015-02-09 MED ORDER — GLYCOPYRROLATE 0.2 MG/ML IJ SOLN
0.2000 mg | Freq: Once | INTRAMUSCULAR | Status: AC
  Administered 2015-02-09: 0.2 mg via INTRAVENOUS

## 2015-02-09 MED ORDER — MINERAL OIL LIGHT 100 % EX OIL
TOPICAL_OIL | CUTANEOUS | Status: DC | PRN
  Administered 2015-02-09: 1 via TOPICAL

## 2015-02-09 MED ORDER — FENTANYL CITRATE (PF) 100 MCG/2ML IJ SOLN
INTRAMUSCULAR | Status: AC
  Filled 2015-02-09: qty 2

## 2015-02-09 MED ORDER — PROPOFOL 10 MG/ML IV BOLUS
INTRAVENOUS | Status: DC | PRN
  Administered 2015-02-09: 10 mg via INTRAVENOUS

## 2015-02-09 MED ORDER — PROPOFOL INFUSION 10 MG/ML OPTIME
INTRAVENOUS | Status: DC | PRN
  Administered 2015-02-09: 12:00:00 via INTRAVENOUS
  Administered 2015-02-09: 100 ug/kg/min via INTRAVENOUS

## 2015-02-09 MED ORDER — MIDAZOLAM HCL 2 MG/2ML IJ SOLN
1.0000 mg | INTRAMUSCULAR | Status: DC | PRN
  Administered 2015-02-09: 2 mg via INTRAVENOUS

## 2015-02-09 MED ORDER — ONDANSETRON HCL 4 MG/2ML IJ SOLN
INTRAMUSCULAR | Status: AC
  Filled 2015-02-09: qty 2

## 2015-02-09 MED ORDER — ONDANSETRON HCL 4 MG/2ML IJ SOLN
4.0000 mg | Freq: Once | INTRAMUSCULAR | Status: AC
  Administered 2015-02-09: 4 mg via INTRAVENOUS

## 2015-02-09 MED ORDER — MIDAZOLAM HCL 5 MG/5ML IJ SOLN
INTRAMUSCULAR | Status: DC | PRN
  Administered 2015-02-09: 2 mg via INTRAVENOUS

## 2015-02-09 MED ORDER — GLYCOPYRROLATE 0.2 MG/ML IJ SOLN
INTRAMUSCULAR | Status: AC
  Filled 2015-02-09: qty 1

## 2015-02-09 MED ORDER — PROPOFOL 10 MG/ML IV BOLUS
INTRAVENOUS | Status: AC
  Filled 2015-02-09: qty 20

## 2015-02-09 MED ORDER — MIDAZOLAM HCL 2 MG/2ML IJ SOLN
INTRAMUSCULAR | Status: AC
  Filled 2015-02-09: qty 4

## 2015-02-09 SURGICAL SUPPLY — 27 items
BLOCK BITE 60FR ADLT L/F BLUE (MISCELLANEOUS) ×4 IMPLANT
ELECT REM PT RETURN 9FT ADLT (ELECTROSURGICAL)
ELECTRODE REM PT RTRN 9FT ADLT (ELECTROSURGICAL) IMPLANT
FCP BXJMBJMB 240X2.8X (CUTTING FORCEPS)
FLOOR PAD 36X40 (MISCELLANEOUS) ×4
FORCEP RJ3 GP 1.8X160 W-NEEDLE (CUTTING FORCEPS) IMPLANT
FORCEPS BIOP RAD 4 LRG CAP 4 (CUTTING FORCEPS) ×4 IMPLANT
FORCEPS BIOP RJ4 240 W/NDL (CUTTING FORCEPS)
FORCEPS BXJMBJMB 240X2.8X (CUTTING FORCEPS) IMPLANT
FORMALIN 10 PREFIL 20ML (MISCELLANEOUS) ×4 IMPLANT
INJECTOR/SNARE I SNARE (MISCELLANEOUS) IMPLANT
KIT ENDO PROCEDURE PEN (KITS) ×4 IMPLANT
MANIFOLD NEPTUNE II (INSTRUMENTS) ×4 IMPLANT
NEEDLE SCLEROTHERAPY 25GX240 (NEEDLE) IMPLANT
OVERTUBE ENDOCUFF GREEN (MISCELLANEOUS) ×4 IMPLANT
PAD FLOOR 36X40 (MISCELLANEOUS) ×2 IMPLANT
PROBE APC STR FIRE (PROBE) IMPLANT
PROBE INJECTION GOLD (MISCELLANEOUS)
PROBE INJECTION GOLD 7FR (MISCELLANEOUS) IMPLANT
SNARE ROTATE MED OVAL 20MM (MISCELLANEOUS) IMPLANT
SNARE SHORT THROW 13M SML OVAL (MISCELLANEOUS) IMPLANT
SYR 50ML LL SCALE MARK (SYRINGE) IMPLANT
SYR INFLATION 60ML (SYRINGE) IMPLANT
TRAP SPECIMEN MUCOUS 40CC (MISCELLANEOUS) IMPLANT
TUBING ENDO SMARTCAP PENTAX (MISCELLANEOUS) ×4 IMPLANT
TUBING IRRIGATION ENDOGATOR (MISCELLANEOUS) ×4 IMPLANT
WATER STERILE IRR 1000ML POUR (IV SOLUTION) ×4 IMPLANT

## 2015-02-09 NOTE — Addendum Note (Signed)
Addendum  created 02/09/15 1504 by Charmaine Downs, CRNA   Modules edited: Anesthesia Medication Administration, Charges VN, Notes Section   Notes Section:  File: [id number]

## 2015-02-09 NOTE — H&P (Signed)
Primary Care Physician:  PROVIDER NOT IN SYSTEM Primary Gastroenterologist:  Dr. Oneida Alar  Pre-Procedure History & Physical: HPI:  Cynthia [name] is a 64 y.o. female here for DYSPHAGIA/abd pain/ANEMIA.  Past Medical History  Diagnosis Date  . Migraine   . Hypertension   . Bronchitis   . High blood cholesterol level   . PTSD (post-traumatic stress disorder)   . COPD (chronic obstructive pulmonary disease)   . Pneumonia   . CHF (congestive heart failure)   . GERD (gastroesophageal reflux disease)   . Angina pectoris     Past Surgical History  Procedure Laterality Date  . Left arm      ? nerve repaired, " it was pinched".  . Tonsillectomy      Prior to Admission medications   Medication Sig Start Date End Date Taking? Authorizing Provider  albuterol (PROVENTIL) (2.5 MG/3ML) 0.083% nebulizer solution Take 2.5 mg by nebulization every 6 (six) hours as needed for wheezing or shortness of breath.   Yes Historical Provider, MD  budesonide (PULMICORT) 180 MCG/ACT inhaler Inhale 1 puff into the lungs 2 (two) times daily.   Yes Historical Provider, MD  CALCIUM PO Take 1 tablet by mouth daily.   Yes Historical Provider, MD  clorazepate (TRANXENE) 7.5 MG tablet Take 7.5 mg by mouth 2 (two) times daily as needed for anxiety.   Yes Historical Provider, MD  ferrous sulfate 325 (65 FE) MG tablet Take 325 mg by mouth daily with breakfast.   Yes Historical Provider, MD  FLUoxetine (PROZAC) 40 MG capsule Take 40 mg by mouth daily.   Yes Historical Provider, MD  LORazepam (ATIVAN) 0.5 MG tablet Take 0.5 mg by mouth as needed for anxiety.   Yes Historical Provider, MD  Multiple Vitamin (MULTIVITAMIN WITH MINERALS) TABS tablet Take 1 tablet by mouth daily.   Yes Historical Provider, MD  nitroGLYCERIN (NITROSTAT) 0.4 MG SL tablet Place 0.4 mg under the tongue every 5 (five) minutes as needed for chest pain.   Yes Historical Provider, MD  omeprazole (PRILOSEC) 20 MG capsule Take 1 capsule (20 mg  total) by mouth daily. 01/15/15  Yes Orvil Feil, NP  propranolol (INDERAL) 80 MG tablet Take 80 mg by mouth 2 (two) times daily.   Yes Historical Provider, MD  simvastatin (ZOCOR) 20 MG tablet Take 20 mg by mouth at bedtime. 11/16/14  Yes Historical Provider, MD  traZODone (DESYREL) 100 MG tablet Take 50-100 mg by mouth at bedtime as needed for sleep.    Yes Historical Provider, MD  zinc gluconate 50 MG tablet Take 50 mg by mouth daily.   Yes Historical Provider, MD    Allergies as of 01/15/2015 - Review Complete 01/15/2015  Allergen Reaction Noted  . Aspirin  11/11/2008    Family History  Problem Relation Age of Onset  . Heart failure Father   . Hypertension Father   . Stroke Father   . Colon cancer Father     History   Social History  . Marital Status: Married    Spouse Name: N/A  . Number of Children: N/A  . Years of Education: N/A   Occupational History  . Not on file.   Social History Main Topics  . Smoking status: Current Every Day Smoker -- 0.25 packs/day for 28 years    Types: Cigarettes    Start date: 09/29/1972  . Smokeless tobacco: Never Used     Comment: 3 ciggs per day  . Alcohol Use: No  . Drug Use:  Yes    Special: "Crack" cocaine     Comment: hx of smoking crack cocaine last 5-6 years ago  . Sexual Activity: No   Other Topics Concern  . Not on file   Social History Narrative    Review of Systems: See HPI, otherwise negative ROS   Physical Exam: BP 156/87 mmHg  Resp 15  Ht '5\' 5"'$  (1.651 m)  Wt 113 lb (51.256 kg)  BMI 18.80 kg/m2  SpO2 100% General:   Alert,  pleasant and cooperative in NAD Head:  Normocephalic and atraumatic. Neck:  Supple; Lungs:  Clear throughout to auscultation.    Heart:  Regular rate and rhythm. Abdomen:  Soft, nontender and nondistended. Normal bowel sounds, without guarding, and without rebound.   Neurologic:  Alert and  oriented x4;  grossly normal neurologically.  Impression/Plan:   DYSPHAGIA/abd  pain/ANEMIA  PLAN:  TCS/EGD/DIL TODAY

## 2015-02-09 NOTE — Progress Notes (Signed)
Quick Note:  PT called and said she went and had the labs done today. ______

## 2015-02-09 NOTE — Transfer of Care (Signed)
Immediate Anesthesia Transfer of Care Note  Patient: Cynthia [name]  Procedure(s) Performed: Procedure(s) with comments: COLONOSCOPY WITH PROPOFOL (N/A) - [place] time in 1141   time out 1154  total time 13 minutes procedure 1 ESOPHAGOGASTRODUODENOSCOPY (EGD) WITH PROPOFOL (N/A) SAVORY DILATION 12.21m, 129m [id number]/A) GASTRIC BIOPSIES   Patient Location: PACU  Anesthesia Type:MAC  Level of Consciousness: awake, oriented and patient cooperative  Airway & Oxygen Therapy: Patient Spontanous Breathing and Patient connected to nasal cannula oxygen  Post-op Assessment: Report given to RN and Post -op Vital signs reviewed and stable  Post vital signs: Reviewed and stable  Last Vitals:  Filed Vitals:   02/09/15 1115  BP: 159/93  Resp: 10    Complications: No apparent anesthesia complications

## 2015-02-09 NOTE — Anesthesia Preprocedure Evaluation (Signed)
Anesthesia Evaluation  Patient identified by MRN, date of birth, ID band Patient awake    Reviewed: Allergy & Precautions, NPO status , Patient's Chart, lab work & pertinent test results  Airway Mallampati: II  TM Distance: >3 FB Neck ROM: Full    Dental  (+) Edentulous Upper, Poor Dentition, Missing, Chipped, Dental Advisory Given,    Pulmonary pneumonia -, resolved, COPDCurrent Smoker,  breath sounds clear to auscultation        Cardiovascular hypertension, Pt. on medications + angina +CHF Rhythm:Regular Rate:Normal     Neuro/Psych  Headaches, PSYCHIATRIC DISORDERS (PTSD)  Neuromuscular disease    GI/Hepatic GERD-  ,(+) Hepatitis -, C  Endo/Other    Renal/GU      Musculoskeletal   Abdominal   Peds  Hematology  (+) anemia ,   Anesthesia Other Findings   Reproductive/Obstetrics                             Anesthesia Physical Anesthesia Plan  ASA: III  Anesthesia Plan: MAC   Post-op Pain Management:    Induction: Intravenous  Airway Management Planned: Simple Face Mask  Additional Equipment:   Intra-op Plan:   Post-operative Plan:   Informed Consent: I have reviewed the patients History and Physical, chart, labs and discussed the procedure including the risks, benefits and alternatives for the proposed anesthesia with the patient or authorized representative who has indicated his/her understanding and acceptance.     Plan Discussed with:   Anesthesia Plan Comments:         Anesthesia Quick Evaluation

## 2015-02-09 NOTE — Anesthesia Postprocedure Evaluation (Signed)
  Anesthesia Post-op Note  Patient: Cynthia [name]  Procedure(s) Performed: Procedure(s) with comments: COLONOSCOPY WITH PROPOFOL (N/A) - Cecum time in 1141   time out 1154  total time 13 minutes procedure 1 ESOPHAGOGASTRODUODENOSCOPY (EGD) WITH PROPOFOL (N/A) SAVORY DILATION 12.35m, 186m 1512m53m97m/A) GASTRIC BIOPSIES   Patient Location: PACU  Anesthesia Type:MAC  Level of Consciousness: awake, alert , oriented and patient cooperative  Airway and Oxygen Therapy: Patient Spontanous Breathing and Patient connected to nasal cannula oxygen  Post-op Pain: none  Post-op Assessment: Post-op Vital signs reviewed, Patient's Cardiovascular Status Stable, Respiratory Function Stable, Patent Airway and No signs of Nausea or vomiting              Post-op Vital Signs: Reviewed and stable  Last Vitals:  Filed Vitals:   02/09/15 1115  BP: 159/93  Resp: 10    Complications: No apparent anesthesia complications

## 2015-02-09 NOTE — Op Note (Signed)
[place], [id number]   ENDOSCOPY PROCEDURE REPORT  PATIENT: Cynthia [name], Cynthia [name]  MR#: [id number] BIRTHDATE: 12-31-1950 , 12  yrs. old GENDER: female  ENDOSCOPIST: Danie Binder, MD REFERRED KF:[id number] Muse, PA PROCEDURE DATE: 02/17/2015 PROCEDURE:   EGD w/ biopsy and Savary dilation of esophagus  INDICATIONS:ANOREXIA, WEIGHT LOSS, POS HEP C. CT ABD/PELVIS MAY 2015: NAIAP MEDICATIONS: Monitored anesthesia care TOPICAL ANESTHETIC:   Viscous Xylocaine ASA CLASS:  DESCRIPTION OF PROCEDURE:     Physical exam was performed.  Informed consent was obtained from the patient after explaining the benefits, risks, and alternatives to the procedure.  The patient was connected to the monitor and placed in the left lateral position.  Continuous oxygen was provided by nasal cannula and IV medicine administered through an indwelling cannula.  After administration of sedation, the patients esophagus was intubated and the     endoscope was advanced under direct visualization to the second portion of the duodenum.  The scope was removed slowly by carefully examining the color, texture, anatomy, and integrity of the mucosa on the way out.  The patient was recovered in endoscopy and discharged home in satisfactory condition.  Estimated blood loss is zero unless otherwise noted in this procedure report.     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 The stricture was traversable. DILATED TO 17 MM WITH SAVARY DILATOR-NO HEME, MINIMAL RESISTANCE.  STOMACH: Mild non-erosive gastritis (inflammation) was found in the gastric antrum.  Multiple biopsies were performed using cold forceps.   DUODENUM: The duodenal mucosa showed no abnormalities in the bulb and 2nd part of the duodenum. COMPLICATIONS: There were no immediate complications.  ENDOSCOPIC IMPRESSION: 1.   Stricture at the gastroesophageal junction 2.   MILD Non-erosive  gastritis  RECOMMENDATIONS: DRINK WATER TO KEEP YOUR URINE LIGHT YELLOW. FOLLOW A HIGH FIBER DIET. CONTINUE OMEPRAZOLE once daily 30 MINUTES PRIOR TO BREAKFAST. TAKE MIRALAX EVERY MONWEDFRI. AWAIT BIOPSY RESULTS. Follow up in 4 mos. Next colonoscopy in 10 years.  REPEAT EXAM: eSigned:  Danie Binder, MD 02/17/2015 4:42 PM  CPT CODES: ICD CODES:  The ICD and CPT codes recommended by this software are interpretations from the data that the clinical staff has captured with the software.  The verification of the translation of this report to the ICD and CPT codes and modifiers is the sole responsibility of the health care institution and practicing physician where this report was generated.  [place]. will not be held responsible for the validity of the ICD and CPT codes included on this report.  AMA assumes no liability for data contained or not contained herein. CPT is a Designer, television/film set of the [employer].

## 2015-02-09 NOTE — Discharge Instructions (Signed)
NO SOURCE FOR YOUR WEIGHT LOSS WAS IDENTIFIED. You DID NOT HAVE ANY POLYPS. YOU HAVE SMALL INTERNAL HEMORRHOIDS, WHICH CAN CAUSE RECTAL BLEEDING. YOU HAVE GASTRITIS. I STRETCHED YOUR ESOPHAGUS DUE A STRICTURE. YOU HAVE GASTRITIS. I BIOPSIED YOUR STOMACH. YOUR SYMPTOMS AR MOST LIKELY DUE TO ICONSTIPATION AND GASTRITIS.    DRINK WATER TO KEEP YOUR URINE LIGHT YELLOW.  FOLLOW A HIGH FIBER DIET. AVOID ITEMS THAT CAUSE BLOATING. SEE INFO BELOW.  CONTINUE OMEPRAZOLE once daily 30 MINUTES PRIOR TO BREAKFAST.  TAKE MIRALAX EVERY MONWEDFRI.  YOUR BIOPSY RESULTS WILL BE AVAILABLE IN MY CHART AFTER AUG 4 AND MY OFFICE WILL CONTACT YOU IN 10-14 DAYS WITH YOUR RESULTS.   Follow up in 4 mos.  Next colonoscopy in 10 years.    ENDOSCOPY Care After Read the instructions outlined below and refer to this sheet in the next week. These discharge instructions provide you with general information on caring for yourself after you leave the hospital. While your treatment has been planned according to the most current medical practices available, unavoidable complications occasionally occur. If you have any problems or questions after discharge, call DR. Tabby Beaston, [phone].  ACTIVITY  You may resume your regular activity, but move at a slower pace for the next 24 hours.   Take frequent rest periods for the next 24 hours.   Walking will help get rid of the air and reduce the bloated feeling in your belly (abdomen).   No driving for 24 hours (because of the medicine (anesthesia) used during the test).   You may shower.   Do not sign any important legal documents or operate any machinery for 24 hours (because of the anesthesia used during the test).    NUTRITION  Drink plenty of fluids.   You may resume your normal diet as instructed by your doctor.   Begin with a light meal and progress to your normal diet. Heavy or fried foods are harder to digest and may make you feel sick to your stomach  (nauseated).   Avoid alcoholic beverages for 24 hours or as instructed.    MEDICATIONS  You may resume your normal medications.   WHAT YOU CAN EXPECT TODAY  Some feelings of bloating in the abdomen.   Passage of more gas than usual.   Spotting of blood in your stool or on the toilet paper  .  IF YOU HAD POLYPS REMOVED DURING THE ENDOSCOPY:  Eat a soft diet IF YOU HAVE NAUSEA, BLOATING, ABDOMINAL PAIN, OR VOMITING.    FINDING OUT THE RESULTS OF YOUR TEST Not all test results are available during your visit. DR. Oneida Alar WILL CALL YOU WITHIN 14 DAYS OF YOUR PROCEDUE WITH YOUR RESULTS. Do not assume everything is normal if you have not heard from DR. Shaunessy Dobratz, CALL HER OFFICE AT [phone].  SEEK IMMEDIATE MEDICAL ATTENTION AND CALL THE OFFICE: [phone] IF:  You have more than a spotting of blood in your stool.   Your belly is swollen (abdominal distention).   You are nauseated or vomiting.   You have a temperature over 101F.   You have abdominal pain or discomfort that is severe or gets worse throughout the day.   Low-Fat Diet BREADS, CEREALS, PASTA, RICE, DRIED PEAS, AND BEANS These products are high in carbohydrates and most are low in fat. Therefore, they can be increased in the diet as substitutes for fatty foods. They too, however, contain calories and should not be eaten in excess. Cereals can be eaten for snacks as  well as for breakfast.   FRUITS AND VEGETABLES It is good to eat fruits and vegetables. Besides being sources of fiber, both are rich in vitamins and some minerals. They help you get the daily allowances of these nutrients. Fruits and vegetables can be used for snacks and desserts.  MEATS Limit lean meat, chicken, [place], and fish to no more than 6 ounces per day. Beef, Pork, and Lamb Use lean cuts of beef, pork, and lamb. Lean cuts include:  Extra-lean ground beef.  Arm roast.  Sirloin tip.  Center-cut ham.  Round steak.  Loin chops.  Rump  roast.  Tenderloin.  Trim all fat off the outside of meats before cooking. It is not necessary to severely decrease the intake of red meat, but lean choices should be made. Lean meat is rich in protein and contains a highly absorbable form of iron. Premenopausal women, in particular, should avoid reducing lean red meat because this could increase the risk for low red blood cells (iron-deficiency anemia).  Chicken and [place] These are good sources of protein. The fat of poultry can be reduced by removing the skin and underlying fat layers before cooking. Chicken and [place] can be substituted for lean red meat in the diet. Poultry should not be fried or covered with high-fat sauces. Fish and Shellfish Fish is a good source of protein. Shellfish contain cholesterol, but they usually are low in saturated fatty acids. The preparation of fish is important. Like chicken and [place], they should not be fried or covered with high-fat sauces. EGGS Egg whites contain no fat or cholesterol. They can be eaten often. Try 1 to 2 egg whites instead of whole eggs in recipes or use egg substitutes that do not contain yolk. MILK AND DAIRY PRODUCTS Use skim or 1% milk instead of 2% or whole milk. Decrease whole milk, natural, and processed cheeses. Use nonfat or low-fat (2%) cottage cheese or low-fat cheeses made from vegetable oils. Choose nonfat or low-fat (1 to 2%) yogurt. Experiment with evaporated skim milk in recipes that call for heavy cream. Substitute low-fat yogurt or low-fat cottage cheese for sour cream in dips and salad dressings. Have at least 2 servings of low-fat dairy products, such as 2 glasses of skim (or 1%) milk each day to help get your daily calcium intake. FATS AND OILS Reduce the total intake of fats, especially saturated fat. Butterfat, lard, and beef fats are high in saturated fat and cholesterol. These should be avoided as much as possible. Vegetable fats do not contain cholesterol, but certain  vegetable fats, such as coconut oil, palm oil, and palm kernel oil are very high in saturated fats. These should be limited. These fats are often used in bakery goods, processed foods, popcorn, oils, and nondairy creamers. Vegetable shortenings and some peanut butters contain hydrogenated oils, which are also saturated fats. Read the labels on these foods and check for saturated vegetable oils. Unsaturated vegetable oils and fats do not raise blood cholesterol. However, they should be limited because they are fats and are high in calories. Total fat should still be limited to 30% of your daily caloric intake. Desirable liquid vegetable oils are corn oil, cottonseed oil, olive oil, canola oil, safflower oil, soybean oil, and sunflower oil. Peanut oil is not as good, but small amounts are acceptable. Buy a heart-healthy tub margarine that has no partially hydrogenated oils in the ingredients. Mayonnaise and salad dressings often are made from unsaturated fats, but they should also be limited because  of their high calorie and fat content. Seeds, nuts, peanut butter, olives, and avocados are high in fat, but the fat is mainly the unsaturated type. These foods should be limited mainly to avoid excess calories and fat. OTHER EATING TIPS Snacks  Most sweets should be limited as snacks. They tend to be rich in calories and fats, and their caloric content outweighs their nutritional value. Some good choices in snacks are graham crackers, melba toast, soda crackers, bagels (no egg), English muffins, fruits, and vegetables. These snacks are preferable to snack crackers, [place] fries, TORTILLA CHIPS, and POTATO chips. Popcorn should be air-popped or cooked in small amounts of liquid vegetable oil. Desserts Eat fruit, low-fat yogurt, and fruit ices instead of pastries, cake, and cookies. Sherbet, angel food cake, gelatin dessert, frozen low-fat yogurt, or other frozen products that do not contain saturated fat (pure fruit  juice bars, frozen ice pops) are also acceptable.  COOKING METHODS Choose those methods that use little or no fat. They include: Poaching.  Braising.  Steaming.  Grilling.  Baking.  Stir-frying.  Broiling.  Microwaving.  Foods can be cooked in a nonstick pan without added fat, or use a nonfat cooking spray in regular cookware. Limit fried foods and avoid frying in saturated fat. Add moisture to lean meats by using water, broth, cooking wines, and other nonfat or low-fat sauces along with the cooking methods mentioned above. Soups and stews should be chilled after cooking. The fat that forms on top after a few hours in the refrigerator should be skimmed off. When preparing meals, avoid using excess salt. Salt can contribute to raising blood pressure in some people.  EATING AWAY FROM HOME Order entres, potatoes, and vegetables without sauces or butter. When meat exceeds the size of a deck of cards (3 to 4 ounces), the rest can be taken home for another meal. Choose vegetable or fruit salads and ask for low-calorie salad dressings to be served on the side. Use dressings sparingly. Limit high-fat toppings, such as bacon, crumbled eggs, cheese, sunflower seeds, and olives. Ask for heart-healthy tub margarine instead of butter.  High-Fiber Diet A high-fiber diet changes your normal diet to include more whole grains, legumes, fruits, and vegetables. Changes in the diet involve replacing refined carbohydrates with unrefined foods. The calorie level of the diet is essentially unchanged. The Dietary Reference Intake (recommended amount) for adult males is 38 grams per day. For adult females, it is 25 grams per day. Pregnant and lactating women should consume 28 grams of fiber per day. Fiber is the intact part of a plant that is not broken down during digestion. Functional fiber is fiber that has been isolated from the plant to provide a beneficial effect in the body. PURPOSE  Increase stool bulk.    Ease and regulate bowel movements.   Lower cholesterol.  REDUCE RISK OF COLON CANCER   INDICATIONS THAT YOU NEED MORE FIBER  Constipation and hemorrhoids.   Uncomplicated diverticulosis (intestine condition) and irritable bowel syndrome.   Weight management.   As a protective measure against hardening of the arteries (atherosclerosis), diabetes, and cancer.   GUIDELINES FOR INCREASING FIBER IN THE DIET  Start adding fiber to the diet slowly. A gradual increase of about 5 more grams (2 slices of whole-wheat bread, 2 servings of most fruits or vegetables, or 1 bowl of high-fiber cereal) per day is best. Too rapid an increase in fiber may result in constipation, flatulence, and bloating.   Drink enough water and fluids  to keep your urine clear or pale yellow. Water, juice, or caffeine-free drinks are recommended. Not drinking enough fluid may cause constipation.   Eat a variety of high-fiber foods rather than one type of fiber.   Try to increase your intake of fiber through using high-fiber foods rather than fiber pills or supplements that contain small amounts of fiber.   The goal is to change the types of food eaten. Do not supplement your present diet with high-fiber foods, but replace foods in your present diet.   INCLUDE A VARIETY OF FIBER SOURCES  Replace refined and processed grains with whole grains, canned fruits with fresh fruits, and incorporate other fiber sources. White rice, white breads, and most bakery goods contain little or no fiber.   Brown whole-grain rice, buckwheat oats, and many fruits and vegetables are all good sources of fiber. These include: broccoli, Brussels sprouts, cabbage, cauliflower, beets, sweet potatoes, white potatoes (skin on), carrots, tomatoes, eggplant, squash, berries, fresh fruits, and dried fruits.   Cereals appear to be the richest source of fiber. Cereal fiber is found in whole grains and bran. Bran is the fiber-rich outer coat of  cereal grain, which is largely removed in refining. In whole-grain cereals, the bran remains. In breakfast cereals, the largest amount of fiber is found in those with "bran" in their names. The fiber content is sometimes indicated on the label.   You may need to include additional fruits and vegetables each day.   In baking, for 1 cup white flour, you may use the following substitutions:   1 cup whole-wheat flour minus 2 tablespoons.   1/2 cup white flour plus 1/2 cup whole-wheat flour.    SYMPTOMS Common symptoms of GERD are heartburn (burning in your chest). This is worse when lying down or bending over. It may also cause belching, or difficulty swallowing, and indigestion. Some of the things which make GERD worse are:  Increased weight pushes on stomach making acid rise more easily.   Smoking markedly increases acid production.   Alcohol decreases lower esophageal sphincter pressure (valve between stomach and esophagus), allowing acid from stomach into esophagus.   Late evening meals and going to bed with a full stomach increases pressure.   Anything that causes an increase in acid production.    HOME CARE INSTRUCTIONS  Try to achieve and maintain an ideal body weight.   Avoid drinking alcoholic beverages.   DO NOT smokE.   Do not wear tight clothing around your chest or stomach.   Eat smaller meals and eat more frequently. This keeps your stomach from getting too full. Eat slowly.   Do not lie down for 2 or 3 hours after eating. Do not eat or drink anything 1 to 2 hours before going to bed.   Avoid caffeine beverages (colas, coffee, cocoa, tea), fatty foods, citrus fruits and all other foods and drinks that contain acid and that seem to increase the problems.   Avoid bending over, especially after eating OR STRAINING. Anything that increases the pressure in your belly increases the amount of acid that may be pushed up into your esophagus.   Gastritis  Gastritis is an  inflammation (the body's way of reacting to injury and/or infection) of the stomach. It is often caused by viral or bacterial (germ) infections. It can also be caused BY ASPIRIN, BC/GOODY POWDER'S, (IBUPROFEN) MOTRIN, OR ALEVE (NAPROXEN), chemicals (including alcohol), SPICY FOODS, and medications. This illness may be associated with generalized malaise (feeling tired, not well),  UPPER ABDOMINAL STOMACH cramps, and fever. One common bacterial cause of gastritis is an organism known as H. Pylori. This can be treated with antibiotics.   Hemorrhoids Hemorrhoids are dilated (enlarged) veins around the rectum. Sometimes clots will form in the veins. This makes them swollen and painful. These are called thrombosed hemorrhoids. Causes of hemorrhoids include:  Constipation.   Straining to have a bowel movement.   HEAVY LIFTING HOME CARE INSTRUCTIONS  Eat a well balanced diet and drink 6 to 8 glasses of water every day to avoid constipation. You may also use a bulk laxative.   Avoid straining to have bowel movements.   Keep anal area dry and clean.   Do not use a donut shaped pillow or sit on the toilet for long periods. This increases blood pooling and pain.   Move your bowels when your body has the urge; this will require less straining and will decrease pain and pressure.

## 2015-02-09 NOTE — Anesthesia Postprocedure Evaluation (Signed)
  Anesthesia Post-op Note  Patient: Cynthia [name]  Procedure(s) Performed: Procedure(s) with comments: COLONOSCOPY WITH PROPOFOL (N/A) - Cecum time in 1141   time out 1154  total time 13 minutes procedure 1 ESOPHAGOGASTRODUODENOSCOPY (EGD) WITH PROPOFOL (N/A) SAVORY DILATION 12.14m, 159m 1550m35m50m/A) GASTRIC BIOPSIES   Patient Location: PACU  Anesthesia Type:MAC  Level of Consciousness: awake, alert , oriented and patient cooperative  Airway and Oxygen Therapy: Patient Spontanous Breathing  Post-op Pain: none  Post-op Assessment: Post-op Vital signs reviewed, Patient's Cardiovascular Status Stable, Respiratory Function Stable and Patent Airway              Post-op Vital Signs: Reviewed and stable  Last Vitals:  Filed Vitals:   02/09/15 1304  BP: 149/67  Pulse: 78  Temp: 36.4 C  Resp: 10    Complications: No apparent anesthesia complications

## 2015-02-09 NOTE — Progress Notes (Signed)
Quick Note:  Metavir score F2 some F3. Patient never completed Hep C RNA with genotype. Also needs to complete iron, ferritin, TIBC. We can't submit for treatment until genotype completed. ______

## 2015-02-09 NOTE — Op Note (Signed)
[facility] [address] [place], [address]   COLONOSCOPY PROCEDURE REPORT  PATIENT: Cynthia [name], Cynthia [name]  MR#: [id number] BIRTHDATE: 1950/10/29 , 73  yrs. old GENDER: female ENDOSCOPIST: Danie Binder, MD REFERRED WN:[id number] Muse, PA PROCEDURE DATE:  2015-03-08 PROCEDURE:   Colonoscopy, screening INDICATIONS:WEIGHT LOSS, RECTAL BLEEDING, CONSTIPATION. MEDICATIONS: Monitored anesthesia care  DESCRIPTION OF PROCEDURE:    Physical exam was performed.  Informed consent was obtained from the patient after explaining the benefits, risks, and alternatives to procedure.  The patient was connected to monitor and placed in left lateral position. Continuous oxygen was provided by nasal cannula and IV medicine administered through an indwelling cannula.  After administration of sedation and rectal exam, the patients rectum was intubated and the     colonoscope was advanced under direct visualization to the ileum.  The scope was removed slowly by carefully examining the color, texture, anatomy, and integrity mucosa on the way out.  The patient was recovered in endoscopy and discharged home in satisfactory condition. Estimated blood loss is zero unless otherwise noted in this procedure report.    COLON FINDINGS: The examined terminal ileum appeared to be normal. , The colonic mucosa appeared normal.  , and Small internal hemorrhoids were found.  PREP QUALITY: good.  CECAL W/D TIME: 13       minutes COMPLICATIONS: None  ENDOSCOPIC IMPRESSION: 1.   The examined terminal ileum appeared to be normal 2.   The colonic mucosa appeared normal 3.   Small internal hemorrhoids  RECOMMENDATIONS: DRINK WATER TO KEEP YOUR URINE LIGHT YELLOW. FOLLOW A HIGH FIBER DIET. CONTINUE OMEPRAZOLE once daily 30 MINUTES PRIOR TO BREAKFAST. TAKE MIRALAX EVERY MONWEDFRI. AWAIT BIOPSY RESULTS. Follow up in 4 mos. Next colonoscopy in 10  years.     _______________________________ eSignedDanie Binder, MD 03/08/2015 4:28 PM   CPT CODES: ICD CODES:  The ICD and CPT codes recommended by this software are interpretations from the data that the clinical staff has captured with the software.  The verification of the translation of this report to the ICD and CPT codes and modifiers is the sole responsibility of the health care institution and practicing physician where this report was generated.  [employer]. will not be held responsible for the validity of the ICD and CPT codes included on this report.  AMA assumes no liability for data contained or not contained herein. CPT is a Designer, television/film set of the [employer].

## 2015-02-09 NOTE — Progress Notes (Signed)
Quick Note:  LMOM to call. ______ 

## 2015-02-10 ENCOUNTER — Encounter (HOSPITAL_COMMUNITY): Payer: Self-pay | Admitting: Gastroenterology

## 2015-02-10 NOTE — Progress Notes (Signed)
Quick Note:  Looks like pt did not have the labs done that was ordered at office visit. I called and told pt if she will come by the office I will leave the orders at front for her to do and she said she would come by.  Orders at front. ______

## 2015-02-11 ENCOUNTER — Telehealth: Payer: Self-pay | Admitting: Gastroenterology

## 2015-02-11 LAB — IRON AND TIBC
%SAT: 17 % — ABNORMAL LOW (ref 20–55)
IRON: 37 ug/dL — AB (ref 42–145)
TIBC: 221 ug/dL — ABNORMAL LOW (ref 250–470)
UIBC: 184 ug/dL (ref 125–400)

## 2015-02-11 LAB — FERRITIN: Ferritin: 276 ng/mL (ref 10–291)

## 2015-02-11 MED ORDER — OMEPRAZOLE 20 MG PO CPDR: 20.0000 mg | DELAYED_RELEASE_CAPSULE | Freq: Two times a day (BID) | ORAL | Status: DC

## 2015-02-11 MED ORDER — CLARITHROMYCIN 500 MG PO TABS: ORAL_TABLET | ORAL | Status: DC

## 2015-02-11 MED ORDER — AMOXICILLIN 500 MG PO TABS: ORAL_TABLET | ORAL | Status: DC

## 2015-02-11 NOTE — Telephone Encounter (Signed)
Pt and husband are aware. °

## 2015-02-11 NOTE — Telephone Encounter (Signed)
PLEASE CALL PT. Her stomach Bx showed H. Pylori infection. IT WILL CAUSE WEIGHT LOSS AND ABDOMINAL PAIN.  IF IT GOES UNTREATED IT CAN LEAD TO STOMACH CANCER. She needs AMOXICILLIN 500 mg 2 po BID for 10 days and Biaxin 500 mg po bid for 10 days. INCREASE Omeprazole 20 mg BID for 3 MOS  then 1 po 30 MINS PRIOR TO BREAKFAST FOR AT LEAST THE NEXT YEAR. DO NOT TAKE ZOCOR WHILE TAKING THE ANTIBIOTICS. The meds CAN cause nausea, vomiting, abd cramps, loose stools, black colored stools, and A metallic taste in her mouth. USE CARNATION INSTANT BREAKFAST, BOOST, OR ENSURE THREE TIMES A DAY TO MAINTAIN WEIGHT AND NUTRITION.

## 2015-02-15 NOTE — Progress Notes (Signed)
Quick Note:  Likely anemia of chronic disease. Ferritin is normal. HCV genotype is pending. ______

## 2015-02-16 NOTE — Progress Notes (Signed)
Quick Note:  Pt is aware of results. ______ 

## 2015-02-17 LAB — HEPATITIS C GENOTYPE: HCV GENOTYPE: 1

## 2015-02-17 LAB — HEPATITIS C RNA QUANTITATIVE
HCV QUANT LOG: 5.76 {Log} — AB (ref <1.18)
HCV QUANT: 580758 [IU]/mL — AB (ref <15)

## 2015-02-27 ENCOUNTER — Encounter: Payer: Self-pay | Admitting: Student

## 2015-02-27 ENCOUNTER — Encounter (HOSPITAL_COMMUNITY): Payer: Self-pay

## 2015-02-27 DIAGNOSIS — F411 Generalized anxiety disorder: Secondary | ICD-10-CM | POA: Insufficient documentation

## 2015-02-27 DIAGNOSIS — F419 Anxiety disorder, unspecified: Secondary | ICD-10-CM | POA: Insufficient documentation

## 2015-03-30 NOTE — Progress Notes (Signed)
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  We can proceed with Harvoni X 12 weeks.  I will send to [facility]. I don't think she has insurance. How do I need to fill this out? ______

## 2015-03-31 ENCOUNTER — Emergency Department (HOSPITAL_COMMUNITY)
Admission: EM | Admit: 2015-03-31 | Discharge: 2015-03-31 | Disposition: A | Discharge status: Home / Self Care | Payer: Self-pay | Attending: Emergency Medicine | Admitting: Emergency Medicine

## 2015-03-31 ENCOUNTER — Encounter (HOSPITAL_COMMUNITY): Payer: Self-pay | Admitting: Emergency Medicine

## 2015-03-31 DIAGNOSIS — R110 Nausea: Secondary | ICD-10-CM | POA: Insufficient documentation

## 2015-03-31 DIAGNOSIS — Z79899 Other long term (current) drug therapy: Secondary | ICD-10-CM | POA: Insufficient documentation

## 2015-03-31 DIAGNOSIS — I10 Essential (primary) hypertension: Secondary | ICD-10-CM | POA: Insufficient documentation

## 2015-03-31 DIAGNOSIS — Z7951 Long term (current) use of inhaled steroids: Secondary | ICD-10-CM | POA: Insufficient documentation

## 2015-03-31 DIAGNOSIS — M1990 Unspecified osteoarthritis, unspecified site: Secondary | ICD-10-CM | POA: Insufficient documentation

## 2015-03-31 DIAGNOSIS — I509 Heart failure, unspecified: Secondary | ICD-10-CM | POA: Insufficient documentation

## 2015-03-31 DIAGNOSIS — F419 Anxiety disorder, unspecified: Secondary | ICD-10-CM | POA: Insufficient documentation

## 2015-03-31 DIAGNOSIS — J449 Chronic obstructive pulmonary disease, unspecified: Secondary | ICD-10-CM | POA: Insufficient documentation

## 2015-03-31 DIAGNOSIS — K219 Gastro-esophageal reflux disease without esophagitis: Secondary | ICD-10-CM | POA: Insufficient documentation

## 2015-03-31 DIAGNOSIS — F329 Major depressive disorder, single episode, unspecified: Secondary | ICD-10-CM | POA: Insufficient documentation

## 2015-03-31 DIAGNOSIS — E780 Pure hypercholesterolemia: Secondary | ICD-10-CM | POA: Insufficient documentation

## 2015-03-31 DIAGNOSIS — G43909 Migraine, unspecified, not intractable, without status migrainosus: Secondary | ICD-10-CM | POA: Insufficient documentation

## 2015-03-31 DIAGNOSIS — Z720 Tobacco use: Secondary | ICD-10-CM | POA: Insufficient documentation

## 2015-03-31 DIAGNOSIS — Z8701 Personal history of pneumonia (recurrent): Secondary | ICD-10-CM | POA: Insufficient documentation

## 2015-03-31 MED ORDER — DEXAMETHASONE SODIUM PHOSPHATE 10 MG/ML IJ SOLN
10.0000 mg | Freq: Once | INTRAMUSCULAR | Status: AC
  Administered 2015-03-31: 10 mg via INTRAVENOUS
  Filled 2015-03-31: qty 1

## 2015-03-31 MED ORDER — DIPHENHYDRAMINE HCL 50 MG/ML IJ SOLN
25.0000 mg | Freq: Once | INTRAMUSCULAR | Status: AC
  Administered 2015-03-31: 25 mg via INTRAVENOUS
  Filled 2015-03-31: qty 1

## 2015-03-31 MED ORDER — KETOROLAC TROMETHAMINE 30 MG/ML IJ SOLN
30.0000 mg | Freq: Once | INTRAMUSCULAR | Status: AC
  Administered 2015-03-31: 30 mg via INTRAVENOUS
  Filled 2015-03-31: qty 1

## 2015-03-31 MED ORDER — METOCLOPRAMIDE HCL 5 MG/ML IJ SOLN
10.0000 mg | Freq: Once | INTRAMUSCULAR | Status: AC
Start: 2015-03-31 — End: 2015-03-31
  Administered 2015-03-31: 10 mg via INTRAVENOUS
  Filled 2015-03-31: qty 2

## 2015-03-31 MED ORDER — HYDROCODONE-ACETAMINOPHEN 5-325 MG PO TABS: 1.0000 | ORAL_TABLET | Freq: Four times a day (QID) | ORAL | Status: DC | PRN

## 2015-03-31 MED ORDER — SODIUM CHLORIDE 0.9 % IV BOLUS (SEPSIS)
1000.0000 mL | Freq: Once | INTRAVENOUS | Status: AC
  Administered 2015-03-31: 1000 mL via INTRAVENOUS

## 2015-03-31 NOTE — ED Notes (Signed)
Pt report having a constant migraine headache for 3 days. Pt reports "light bothers me", but denies bothersome with auditory stimuli. Pt denies nausea and vomiting. Pt has been taking Goody powder at home without relief.

## 2015-03-31 NOTE — ED Notes (Signed)
Having migraine for last 3 days.  History of migraines.  Rates pain 10/10.  Took Goody powder this am with no relief.

## 2015-03-31 NOTE — Discharge Instructions (Signed)
Follow-up with your primary Dr. to discuss your prescription medications.   Migraine Headache A migraine headache is an intense, throbbing pain on one or both sides of your head. A migraine can last for 30 minutes to several hours. CAUSES  The exact cause of a migraine headache is not always known. However, a migraine may be caused when nerves in the brain become irritated and release chemicals that cause inflammation. This causes pain. Certain things may also trigger migraines, such as:  Alcohol.  Smoking.  Stress.  Menstruation.  Aged cheeses.  Foods or drinks that contain nitrates, glutamate, aspartame, or tyramine.  Lack of sleep.  Chocolate.  Caffeine.  Hunger.  Physical exertion.  Fatigue.  Medicines used to treat chest pain (nitroglycerine), birth control pills, estrogen, and some blood pressure medicines. SIGNS AND SYMPTOMS  Pain on one or both sides of your head.  Pulsating or throbbing pain.  Severe pain that prevents daily activities.  Pain that is aggravated by any physical activity.  Nausea, vomiting, or both.  Dizziness.  Pain with exposure to bright lights, loud noises, or activity.  General sensitivity to bright lights, loud noises, or smells. Before you get a migraine, you may get warning signs that a migraine is coming (aura). An aura may include:  Seeing flashing lights.  Seeing bright spots, halos, or zigzag lines.  Having tunnel vision or blurred vision.  Having feelings of numbness or tingling.  Having trouble talking.  Having muscle weakness. DIAGNOSIS  A migraine headache is often diagnosed based on:  Symptoms.  Physical exam.  A CT scan or MRI of your head. These imaging tests cannot diagnose migraines, but they can help rule out other causes of headaches. TREATMENT Medicines may be given for pain and nausea. Medicines can also be given to help prevent recurrent migraines.  HOME CARE INSTRUCTIONS  Only take  over-the-counter or prescription medicines for pain or discomfort as directed by your health care provider. The use of long-term narcotics is not recommended.  Lie down in a dark, quiet room when you have a migraine.  Keep a journal to find out what may trigger your migraine headaches. For example, write down:  What you eat and drink.  How much sleep you get.  Any change to your diet or medicines.  Limit alcohol consumption.  Quit smoking if you smoke.  Get 7-9 hours of sleep, or as recommended by your health care provider.  Limit stress.  Keep lights dim if bright lights bother you and make your migraines worse. SEEK IMMEDIATE MEDICAL CARE IF:   Your migraine becomes severe.  You have a fever.  You have a stiff neck.  You have vision loss.  You have muscular weakness or loss of muscle control.  You start losing your balance or have trouble walking.  You feel faint or pass out.  You have severe symptoms that are different from your first symptoms. MAKE SURE YOU:   Understand these instructions.  Will watch your condition.  Will get help right away if you are not doing well or get worse. Document Released: 06/26/2005 Document Revised: 11/10/2013 Document Reviewed: 03/03/2013 [facility] Patient Information 2015 [place], [place]. This information is not intended to replace advice given to you by your health care provider. Make sure you discuss any questions you have with your health care provider.

## 2015-03-31 NOTE — ED Notes (Signed)
Patient with no complaints at this time. Respirations even and unlabored. Skin warm/dry. Discharge instructions reviewed with patient at this time. Patient given opportunity to voice concerns/ask questions. IV removed per policy and band-aid applied to site. Patient discharged at this time and left Emergency Department with steady gait.  

## 2015-03-31 NOTE — ED Provider Notes (Signed)
CSN: [id number]     Arrival date & time 03/31/15  1458 History   First MD Initiated Contact with Patient 03/31/15 1525     Chief Complaint  Patient presents with  . Migraine     (Consider location/radiation/quality/duration/timing/severity/associated sxs/prior Treatment) HPI Comments: Patient is a 64 year old female with history of hypertension, migraines, PTSD. She presents for evaluation of headache that has been worsening for the past 3 days. She denies any injury or trauma. She denies any blurry vision but does feel nauseated but has not vomited. She tells me she usually has hydrocodone prescribed by her primary doctor, however this was recently stopped.  Patient is a 64 y.o. female presenting with migraines. The history is provided by the patient.  Migraine This is a recurrent problem. The current episode started 2 days ago. The problem occurs constantly. The problem has been gradually worsening. Nothing aggravates the symptoms. Nothing relieves the symptoms. She has tried nothing for the symptoms.    Past Medical History  Diagnosis Date  . Migraine   . Hypertension   . Bronchitis   . High blood cholesterol level   . PTSD (post-traumatic stress disorder)   . COPD (chronic obstructive pulmonary disease)   . Pneumonia   . CHF (congestive heart failure)   . GERD (gastroesophageal reflux disease)   . Angina pectoris   . Arthritis   . Bronchitis   . Migraines   . Anxiety   . Depression    Past Surgical History  Procedure Laterality Date  . Left arm      ? nerve repaired, " it was pinched".  . Tonsillectomy    . Colonoscopy with propofol N/A 02/09/2015    Procedure: COLONOSCOPY WITH PROPOFOL;  Surgeon: Danie Binder, MD;  Location: [facility];  Service: Endoscopy;  Laterality: N/A;  Cecum time in 1141   time out 1154  total time 13 minutes procedure 1  . Esophagogastroduodenoscopy (egd) with propofol N/A 02/09/2015    Procedure: ESOPHAGOGASTRODUODENOSCOPY (EGD) WITH PROPOFOL;   Surgeon: Danie Binder, MD;  Location: [facility];  Service: Endoscopy;  Laterality: N/A;  . Savory dilation N/A 02/09/2015    Procedure: SAVORY DILATION 12.75m, 17m [id number]: SandDanie Binder;  Location: [facility];  Service: Endoscopy;  Laterality: N/A;  . Esophageal biopsy  02/09/2015    Procedure: GASTRIC BIOPSIES ;  Surgeon: SandDanie Binder;  Location: [facility];  Service: Endoscopy;;  . Left elbow surgery Left    Family History  Problem Relation Age of Onset  . Heart failure Father   . Hypertension Father   . Stroke Father   . Colon cancer Father   . Cancer Father   . Cancer Mother   . Seizures Sister    Social History  Substance Use Topics  . Smoking status: Current Every Day Smoker    Types: Cigarettes    Start date: 09/29/1972  . Smokeless tobacco: Never Used     Comment: 3 ciggs per day  . Alcohol Use: No   OB History    Gravida Para Term Preterm AB TAB SAB Ectopic Multiple Living   '2 1   1          '$ Review of Systems  All other systems reviewed and are negative.     Allergies  Aspirin  Home Medications   Prior to Admission medications   Medication Sig Start Date End Date Taking? Authorizing Provider  albuterol (PROVENTIL) (2.5 MG/3ML) 0.083% nebulizer solution Take  2.5 mg by nebulization every 6 (six) hours as needed for wheezing or shortness of breath.    Historical Provider, MD  amoxicillin (AMOXIL) 500 MG tablet 2 PO BID FOR 10 DAYS 02/11/15   Danie Binder, MD  budesonide (PULMICORT) 180 MCG/ACT inhaler Inhale 1 puff into the lungs 2 (two) times daily.    Historical Provider, MD  budesonide (PULMICORT) 180 MCG/ACT inhaler Inhale 1 puff into the lungs daily.    Historical Provider, MD  budesonide (PULMICORT) 180 MCG/ACT inhaler Inhale 1 puff into the lungs daily.    Historical Provider, MD  CALCIUM PO Take 1 tablet by mouth daily.    Historical Provider, MD  clarithromycin (BIAXIN) 500 MG tablet 1 PO BID FOR 10 DAYS. 02/11/15   Danie Binder, MD   clorazepate (TRANXENE) 7.5 MG tablet Take 7.5 mg by mouth 2 (two) times daily as needed for anxiety.    Historical Provider, MD  ferrous sulfate 325 (65 FE) MG tablet Take 325 mg by mouth daily with breakfast.    Historical Provider, MD  FLUoxetine (PROZAC) 40 MG capsule Take 40 mg by mouth daily.    Historical Provider, MD  FLUoxetine (PROZAC) 40 MG capsule Take 40 mg by mouth daily.    Historical Provider, MD  FLUoxetine (PROZAC) 40 MG capsule Take 80 mg by mouth daily.    Historical Provider, MD  LORazepam (ATIVAN) 0.5 MG tablet Take 0.5 mg by mouth as needed for anxiety.    Historical Provider, MD  Multiple Vitamin (MULTIVITAMIN WITH MINERALS) TABS tablet Take 1 tablet by mouth daily.    Historical Provider, MD  nitroGLYCERIN (NITROSTAT) 0.4 MG SL tablet Place 0.4 mg under the tongue every 5 (five) minutes as needed for chest pain.    Historical Provider, MD  omeprazole (PRILOSEC) 20 MG capsule Take 1 capsule (20 mg total) by mouth 2 (two) times daily before a meal. FOR 3 MOS THEN ONCE DAILY 02/11/15   Danie Binder, MD  oxyCODONE-acetaminophen (ROXICET) 5-325 MG/5ML solution Take 5 mLs by mouth every 6 (six) hours as needed for severe pain.    Historical Provider, MD  propranolol (INDERAL) 80 MG tablet Take 80 mg by mouth 2 (two) times daily.    Historical Provider, MD  simvastatin (ZOCOR) 20 MG tablet Take 20 mg by mouth at bedtime. 11/16/14   Historical Provider, MD  traZODone (DESYREL) 100 MG tablet Take 50-100 mg by mouth at bedtime as needed for sleep.     Historical Provider, MD  traZODone (DESYREL) 50 MG tablet Take 25 mg by mouth at bedtime as needed for sleep.    Historical Provider, MD  zinc gluconate 50 MG tablet Take 50 mg by mouth daily.    Historical Provider, MD   BP 187/105 mmHg  Pulse 72  Temp(Src) 98.3 F (36.8 C) (Oral)  Resp 22  Ht '5\' 5"'$  (1.651 m)  Wt 113 lb (51.256 kg)  BMI 18.80 kg/m2  SpO2 100% Physical Exam  Constitutional: She is oriented to person, place, and  time. She appears well-developed and well-nourished. No distress.  HENT:  Head: Normocephalic and atraumatic.  Eyes: EOM are normal. Pupils are equal, round, and reactive to light.  There is no papilledema on funduscopic exam  Neck: Normal range of motion. Neck supple.  Cardiovascular: Normal rate and regular rhythm.  Exam reveals no gallop and no friction rub.   No murmur heard. Pulmonary/Chest: Effort normal and breath sounds normal. No respiratory distress. She has no wheezes.  Abdominal: Soft. Bowel sounds are normal. She exhibits no distension. There is no tenderness.  Musculoskeletal: Normal range of motion.  Neurological: She is alert and oriented to person, place, and time. No cranial nerve deficit. She exhibits normal muscle tone. Coordination normal.  Skin: Skin is warm and dry. She is not diaphoretic.  Nursing note and vitals reviewed.   ED Course  Procedures (including critical care time) Labs Review Labs Reviewed - No data to display  Imaging Review No results found. I have personally reviewed and evaluated these images and lab results as part of my medical decision-making.   EKG Interpretation None      MDM   Final diagnoses:  None    Patient presents with complaints of migraine headache. She describes this as similar to her typical migraine, however is worse than normal. Her neurologic exam is nonfocal and she is feeling better with medications in the ER. I do not feel as though CT or LP is indicated and believe she is appropriate for discharge. She understands to return if symptoms worsen or change.    Veryl Speak, MD 03/31/15 [phone]

## 2015-04-08 NOTE — Progress Notes (Signed)
Quick Note:  Do I need to send in prescription or wait? ______

## 2015-04-12 ENCOUNTER — Other Ambulatory Visit: Payer: Self-pay | Admitting: Physician Assistant

## 2015-04-12 LAB — CBC
HCT: 32.1 % — ABNORMAL LOW (ref 36.0–46.0)
HEMOGLOBIN: 10.3 g/dL — AB (ref 12.0–15.0)
MCH: 27.2 pg (ref 26.0–34.0)
MCHC: 32.1 g/dL (ref 30.0–36.0)
MCV: 84.9 fL (ref 78.0–100.0)
MPV: 9 fL (ref 8.6–12.4)
Platelets: 358 10*3/uL (ref 150–400)
RBC: 3.78 MIL/uL — ABNORMAL LOW (ref 3.87–5.11)
RDW: 17.6 % — ABNORMAL HIGH (ref 11.5–15.5)
WBC: 8.1 10*3/uL (ref 4.0–10.5)

## 2015-04-12 LAB — COMPREHENSIVE METABOLIC PANEL
ALBUMIN: 4.2 g/dL (ref 3.6–5.1)
ALK PHOS: 68 U/L (ref 33–130)
ALT: 22 U/L (ref 6–29)
AST: 23 U/L (ref 10–35)
BILIRUBIN TOTAL: 0.2 mg/dL (ref 0.2–1.2)
BUN: 13 mg/dL (ref 7–25)
CO2: 27 mmol/L (ref 20–31)
Calcium: 9.8 mg/dL (ref 8.6–10.4)
Chloride: 104 mmol/L (ref 98–110)
Creat: 0.84 mg/dL (ref 0.50–0.99)
Glucose, Bld: 88 mg/dL (ref 65–99)
POTASSIUM: 3.9 mmol/L (ref 3.5–5.3)
Sodium: 138 mmol/L (ref 135–146)
TOTAL PROTEIN: 7.6 g/dL (ref 6.1–8.1)

## 2015-04-12 LAB — LIPID PANEL
CHOLESTEROL: 251 mg/dL — AB (ref 125–200)
HDL: 63 mg/dL (ref >=46)
LDL Cholesterol: 166 mg/dL — ABNORMAL HIGH (ref <130)
TRIGLYCERIDES: 110 mg/dL (ref <150)
Total CHOL/HDL Ratio: 4 Ratio (ref <=5.0)
VLDL: 22 mg/dL (ref <30)

## 2015-04-13 ENCOUNTER — Encounter: Payer: Self-pay | Admitting: Physician Assistant

## 2015-04-13 ENCOUNTER — Ambulatory Visit: Payer: Self-pay | Admitting: Physician Assistant

## 2015-04-13 VITALS — BP 124/70 | HR 74 | Temp 97.9°F | Ht 64.0 in | Wt 117.0 lb

## 2015-04-13 DIAGNOSIS — G44229 Chronic tension-type headache, not intractable: Secondary | ICD-10-CM | POA: Insufficient documentation

## 2015-04-13 DIAGNOSIS — D649 Anemia, unspecified: Secondary | ICD-10-CM

## 2015-04-13 DIAGNOSIS — E785 Hyperlipidemia, unspecified: Secondary | ICD-10-CM

## 2015-04-13 DIAGNOSIS — B182 Chronic viral hepatitis C: Secondary | ICD-10-CM

## 2015-04-13 DIAGNOSIS — I10 Essential (primary) hypertension: Secondary | ICD-10-CM

## 2015-04-13 DIAGNOSIS — F17200 Nicotine dependence, unspecified, uncomplicated: Secondary | ICD-10-CM

## 2015-04-13 DIAGNOSIS — J449 Chronic obstructive pulmonary disease, unspecified: Secondary | ICD-10-CM | POA: Insufficient documentation

## 2015-04-13 MED ORDER — BUDESONIDE 180 MCG/ACT IN AEPB: 2.0000 | INHALATION_SPRAY | Freq: Two times a day (BID) | RESPIRATORY_TRACT | Status: DC

## 2015-04-13 MED ORDER — BUTALBITAL-APAP-CAFFEINE 50-325-40 MG PO TABS: 1.0000 | ORAL_TABLET | Freq: Three times a day (TID) | ORAL | Status: DC | PRN

## 2015-04-13 NOTE — Progress Notes (Signed)
BP 124/70 mmHg  Pulse 74  Temp(Src) 97.9 F (36.6 C)  Ht '5\' 4"'$  (1.626 m)  Wt 117 lb (53.071 kg)  BMI 20.07 kg/m2  SpO2 98%   Subjective:    Patient ID: [name], female    DOB: 26-Mar-1951, 64 y.o.   MRN: [id number]  HPI: Cynthia [name] is a 64 y.o. female presenting on 04/13/2015 for Hypertension; Hyperlipidemia; Headache; Anemia; and COPD   HPI   Chief Complaint  Patient presents with  . Hypertension  . Hyperlipidemia  . Headache    no recent changes. still sees pyschiatrist for anxiety & ptsd which contribute to her chronic HA  . Anemia    pt taking iron with h/h improving  . COPD    having bit of sob recently. using nebs which helps. she is still smoking     Relevant past medical, surgical, family and social history reviewed and updated as indicated. Interim medical history since our last visit reviewed. Allergies and medications reviewed and updated.  Review of Systems  Constitutional: Negative for activity change and appetite change.  Respiratory: Positive for shortness of breath. Negative for cough.   Gastrointestinal: Negative for abdominal pain.  Genitourinary: Negative for dysuria.  Skin: Negative for rash.  Neurological: Positive for headaches (no recent changes).  Psychiatric/Behavioral: Negative for suicidal ideas.    Per HPI unless specifically indicated above     Objective:    BP 124/70 mmHg  Pulse 74  Temp(Src) 97.9 F (36.6 C)  Ht '5\' 4"'$  (1.626 m)  Wt 117 lb (53.071 kg)  BMI 20.07 kg/m2  SpO2 98%  Wt Readings from Last 3 Encounters:  04/13/15 117 lb (53.071 kg)  03/31/15 113 lb (51.256 kg)  01/07/15 114 lb 11.2 oz (52.028 kg)    Physical Exam  Constitutional: She is oriented to person, place, and time.  HENT:  Head: Normocephalic and atraumatic.  Neck: Neck supple.  Cardiovascular: Normal rate and regular rhythm.   Pulmonary/Chest: Effort normal and breath sounds normal.  Abdominal: Soft. Bowel sounds are normal. She  exhibits no mass. There is no tenderness.  Neurological: She is alert and oriented to person, place, and time.  Skin: Skin is warm and dry.    Results for orders placed or performed in visit on 04/12/15  CBC  Result Value Ref Range   WBC 8.1 4.0 - 10.5 K/uL   RBC 3.78 (L) 3.87 - 5.11 MIL/uL   Hemoglobin 10.3 (L) 12.0 - 15.0 g/dL   HCT 32.1 (L) 36.0 - 46.0 %   MCV 84.9 78.0 - 100.0 fL   MCH 27.2 26.0 - 34.0 pg   MCHC 32.1 30.0 - 36.0 g/dL   RDW 17.6 (H) 11.5 - 15.5 %   Platelets 358 150 - 400 K/uL   MPV 9.0 8.6 - 12.4 fL  Comprehensive metabolic panel  Result Value Ref Range   Sodium 138 135 - 146 mmol/L   Potassium 3.9 3.5 - 5.3 mmol/L   Chloride 104 98 - 110 mmol/L   CO2 27 20 - 31 mmol/L   Glucose, Bld 88 65 - 99 mg/dL   BUN 13 7 - 25 mg/dL   Creat 0.84 0.50 - 0.99 mg/dL   Total Bilirubin 0.2 0.2 - 1.2 mg/dL   Alkaline Phosphatase 68 33 - 130 U/L   AST 23 10 - 35 U/L   ALT 22 6 - 29 U/L   Total Protein 7.6 6.1 - 8.1 g/dL   Albumin 4.2 3.6 -  5.1 g/dL   Calcium 9.8 8.6 - 10.4 mg/dL  Lipid panel  Result Value Ref Range   Cholesterol 251 (H) 125 - 200 mg/dL   Triglycerides 110 <150 mg/dL   HDL 63 >=46 mg/dL   Total CHOL/HDL Ratio 4.0 <=5.0 Ratio   VLDL 22 <30 mg/dL   LDL Cholesterol 166 (H) <130 mg/dL      Assessment & Plan:   htn- Reviewed labs with pt.  She will cont  Current meds. Hyperlipidemia- She will watch lowfat diet and is given handout.   Smoking- Counseled on smoking cessation and it's effects on sob. Copd- Pt will be signed up for [facility] and rx for inhalers sent there HA Hepatitis- Pt is continuing with GI for treatment of Hepatitis Anemia- Pt is to cont with her iron for her anemia   rto for f/u in 3 mo.  rto sooner prn

## 2015-04-13 NOTE — Patient Instructions (Addendum)
Smoking Cessation Quitting smoking is important to your health and has many advantages. However, it is not always easy to quit since nicotine is a very addictive drug. Oftentimes, people try 3 times or more before being able to quit. This document explains the best ways for you to prepare to quit smoking. Quitting takes hard work and a lot of effort, but you can do it. ADVANTAGES OF QUITTING SMOKING  You will live longer, feel better, and live better.  Your body will feel the impact of quitting smoking almost immediately.  Within 20 minutes, blood pressure decreases. Your pulse returns to its normal level.  After 8 hours, carbon monoxide levels in the blood return to normal. Your oxygen level increases.  After 24 hours, the chance of having a heart attack starts to decrease. Your breath, hair, and body stop smelling like smoke.  After 48 hours, damaged nerve endings begin to recover. Your sense of taste and smell improve.  After 72 hours, the body is virtually free of nicotine. Your bronchial tubes relax and breathing becomes easier.  After 2 to 12 weeks, lungs can hold more air. Exercise becomes easier and circulation improves.  The risk of having a heart attack, stroke, cancer, or lung disease is greatly reduced.  After 1 year, the risk of coronary heart disease is cut in half.  After 5 years, the risk of stroke falls to the same as a nonsmoker.  After 10 years, the risk of lung cancer is cut in half and the risk of other cancers decreases significantly.  After 15 years, the risk of coronary heart disease drops, usually to the level of a nonsmoker.  If you are pregnant, quitting smoking will improve your chances of having a healthy baby.  The people you live with, especially any children, will be healthier.  You will have extra money to spend on things other than cigarettes. QUESTIONS TO THINK ABOUT BEFORE ATTEMPTING TO QUIT You may want to talk about your answers with your  health care provider.  Why do you want to quit?  If you tried to quit in the past, what helped and what did not?  What will be the most difficult situations for you after you quit? How will you plan to handle them?  Who can help you through the tough times? Your family? Friends? A health care provider?  What pleasures do you get from smoking? What ways can you still get pleasure if you quit? Here are some questions to ask your health care provider:  How can you help me to be successful at quitting?  What medicine do you think would be best for me and how should I take it?  What should I do if I need more help?  What is smoking withdrawal like? How can I get information on withdrawal? GET READY  Set a quit date.  Change your environment by getting rid of all cigarettes, ashtrays, matches, and lighters in your home, car, or work. Do not let people smoke in your home.  Review your past attempts to quit. Think about what worked and what did not. GET SUPPORT AND ENCOURAGEMENT You have a better chance of being successful if you have help. You can get support in many ways.  Tell your family, friends, and coworkers that you are going to quit and need their support. Ask them not to smoke around you.  Get individual, group, or telephone counseling and support. Programs are available at local hospitals and health centers. Call   your local health department for information about programs in your area.  Spiritual beliefs and practices may help some smokers quit.  Download a "quit meter" on your computer to keep track of quit statistics, such as how long you have gone without smoking, cigarettes not smoked, and money saved.  Get a self-help book about quitting smoking and staying off tobacco. [place] yourself from urges to smoke. Talk to someone, go for a walk, or occupy your time with a task.  Change your normal routine. Take a different route to work.  Drink tea instead of coffee. Eat breakfast in a different place.  Reduce your stress. Take a hot bath, exercise, or read a book.  Plan something enjoyable to do every day. Reward yourself for not smoking.  Explore interactive web-based programs that specialize in helping you quit. GET MEDICINE AND USE IT CORRECTLY Medicines can help you stop smoking and decrease the urge to smoke. Combining medicine with the above behavioral methods and support can greatly increase your chances of successfully quitting smoking.  Nicotine replacement therapy helps deliver nicotine to your body without the negative effects and risks of smoking. Nicotine replacement therapy includes nicotine gum, lozenges, inhalers, nasal sprays, and skin patches. Some may be available over-the-counter and others require a prescription.  Antidepressant medicine helps people abstain from smoking, but how this works is unknown. This medicine is available by prescription.  Nicotinic receptor partial agonist medicine simulates the effect of nicotine in your brain. This medicine is available by prescription. Ask your health care provider for advice about which medicines to use and how to use them based on your health history. Your health care provider will tell you what side effects to look out for if you choose to be on a medicine or therapy. Carefully read the information on the package. Do not use any other product containing nicotine while using a nicotine replacement product.  RELAPSE OR DIFFICULT SITUATIONS Most relapses occur within the first 3 months after quitting. Do not be discouraged if you start smoking again. Remember, most people try several times before finally quitting. You may have symptoms of withdrawal because your body is used to nicotine. You may crave cigarettes, be irritable, feel very hungry, cough often, get headaches, or have difficulty concentrating. The withdrawal symptoms are only temporary. They are strongest  when you first quit, but they will go away within 10-14 days. To reduce the chances of relapse, try to:  Avoid drinking alcohol. Drinking lowers your chances of successfully quitting.  Reduce the amount of caffeine you consume. Once you quit smoking, the amount of caffeine in your body increases and can give you symptoms, such as a rapid heartbeat, sweating, and anxiety.  Avoid smokers because they can make you want to smoke.  Do not let weight gain distract you. Many smokers will gain weight when they quit, usually less than 10 pounds. Eat a healthy diet and stay active. You can always lose the weight gained after you quit.  Find ways to improve your mood other than smoking. FOR MORE INFORMATION  www.smokefree.gov  Document Released: 06/20/2001 Document Revised: 11/10/2013 Document Reviewed: 10/05/2011 [facility] Patient Information 2015 [place], [place]. This information is not intended to replace advice given to you by your health care provider. Make sure you discuss any questions you have with your health care provider. Fat and Cholesterol Control Diet Your diet has an affect on your fat and cholesterol levels in your blood and organs. Too  much fat and cholesterol in your blood can affect your:  Heart.  Blood vessels (arteries, veins).  Gallbladder.  Liver.  Pancreas. CONTROL FAT AND CHOLESTEROL WITH DIET Certain foods raise cholesterol and others lower it. It is important to replace bad fats with other types of fat.  Do not eat:  Fatty meats, such as hot dogs and salami.  Stick margarine and some tub margarines that have "partially hydrogenated oils" in them.  Baked goods, such as cookies and crackers that have "partially hydrogenated oils" in them.  Saturated tropical oils, such as coconut and palm oil. Eat the following foods:  Round or loin cuts of red meat.  Chicken (without skin).  Fish.  Veal.  Ground [place] breast.  Shellfish.  Fruit, such as  apples.  Vegetables, such as broccoli, potatoes, and carrots.  Beans, peas, and lentils (legumes).  Grains, such as barley, rice, couscous, and bulgar wheat.  Pasta (without cream sauces). Look for foods that are nonfat, low in fat, and low in cholesterol.  FIND FOODS THAT ARE LOWER IN FAT AND CHOLESTEROL  Find foods with soluble fiber and plant sterols (phytosterol). You should eat 2 grams a day of these foods. These foods include:  Fruits.  Vegetables.  Whole grains.  Dried beans and peas.  Nuts and seeds.  Read package labels. Look for low-saturated fats, trans fat free, low-fat foods.  Choose cheese that have only 2 to 3 grams of saturated fat per ounce.  Use heart-healthy tub margarine that is free of trans fat or partially hydrogenated oil.  Avoid buying baked goods that have partially hydrogenated oils in them. Instead, buy baked goods made with whole grains (whole-wheat or whole oat flour). Avoid baked goods labeled with "flour" or "enriched flour."  Buy non-creamy canned soups with reduced salt and no added fats. PREPARING YOUR FOOD  Broil, bake, steam, or roast foods. Do not fry food.  Use non-stick cooking sprays.  Use lemon or herbs to flavor food instead of using butter or stick margarine.  Use nonfat yogurt, salsa, or low-fat dressings for salads. LOW-SATURATED FAT / LOW-FAT FOOD SUBSTITUTES  Meats / Saturated Fat (g)  Avoid: Steak, marbled (3 oz/85 g) / 11 g.  Choose: Steak, lean (3 oz/85 g) / 4 g.  Avoid: Hamburger (3 oz/85 g) / 7 g.  Choose: Hamburger, lean (3 oz/85 g) / 5 g.  Avoid: Ham (3 oz/85 g) / 6 g.  Choose: Ham, lean cut (3 oz/85 g) / 2.4 g.  Avoid: Chicken, with skin, dark meat (3 oz/85 g) / 4 g.  Choose: Chicken, skin removed, dark meat (3 oz/85 g) / 2 g.  Avoid: Chicken, with skin, light meat (3 oz/85 g) / 2.5 g.  Choose: Chicken, skin removed, light meat (3 oz/85 g) / 1 g. Dairy / Saturated Fat (g)  Avoid: Whole milk (1  cup) / 5 g.  Choose: Low-fat milk, 2% (1 cup) / 3 g.  Choose: Low-fat milk, 1% (1 cup) / 1.5 g.  Choose: Skim milk (1 cup) / 0.3 g.  Avoid: Hard cheese (1 oz/28 g) / 6 g.  Choose: Skim milk cheese (1 oz/28 g) / 2 to 3 g.  Avoid: Cottage cheese, 4% fat (1 cup) / 6.5 g.  Choose: Low-fat cottage cheese, 1% fat (1 cup) / 1.5 g.  Avoid: Ice cream (1 cup) / 9 g.  Choose: Sherbet (1 cup) / 2.5 g.  Choose: Nonfat frozen yogurt (1 cup) / 0.3 g.  Choose: Frozen  fruit bar / trace.  Avoid: Whipped cream (1 tbs) / 3.5 g.  Choose: Nondairy whipped topping (1 tbs) / 1 g. Condiments / Saturated Fat (g)  Avoid: Mayonnaise (1 tbs) / 2 g.  Choose: Low-fat mayonnaise (1 tbs) / 1 g.  Avoid: Butter (1 tbs) / 7 g.  Choose: Extra light margarine (1 tbs) / 1 g.  Avoid: Coconut oil (1 tbs) / 11.8 g.  Choose: Olive oil (1 tbs) / 1.8 g.  Choose: Corn oil (1 tbs) / 1.7 g.  Choose: Safflower oil (1 tbs) / 1.2 g.  Choose: Sunflower oil (1 tbs) / 1.4 g.  Choose: Soybean oil (1 tbs) / 2.4 g .  Choose: Canola oil (1 tbs) / 1 g. Document Released: 12/26/2011 Document Revised: 02/26/2013 Document Reviewed: 09/25/2013 [facility] Patient Information 2015 [place], [place]. This information is not intended to replace advice given to you by your health care provider. Make sure you discuss any questions you have with your health care provider.

## 2015-06-18 ENCOUNTER — Ambulatory Visit: Payer: Self-pay | Admitting: Gastroenterology

## 2015-07-01 ENCOUNTER — Other Ambulatory Visit: Payer: Self-pay | Admitting: Physician Assistant

## 2015-07-08 ENCOUNTER — Other Ambulatory Visit: Payer: Self-pay | Admitting: Physician Assistant

## 2015-07-08 MED ORDER — BUTALBITAL-APAP-CAFFEINE 50-325-40 MG PO TABS: 1.0000 | ORAL_TABLET | Freq: Four times a day (QID) | ORAL | Status: DC | PRN

## 2015-07-14 ENCOUNTER — Encounter: Payer: Self-pay | Admitting: Physician Assistant

## 2015-07-14 ENCOUNTER — Ambulatory Visit: Payer: Self-pay | Admitting: Physician Assistant

## 2015-07-14 ENCOUNTER — Other Ambulatory Visit: Payer: Self-pay | Admitting: Physician Assistant

## 2015-07-14 VITALS — BP 148/82 | HR 65 | Temp 97.3°F | Ht 64.0 in | Wt 129.4 lb

## 2015-07-14 DIAGNOSIS — E876 Hypokalemia: Secondary | ICD-10-CM

## 2015-07-14 DIAGNOSIS — I10 Essential (primary) hypertension: Secondary | ICD-10-CM

## 2015-07-14 DIAGNOSIS — D649 Anemia, unspecified: Secondary | ICD-10-CM

## 2015-07-14 DIAGNOSIS — J449 Chronic obstructive pulmonary disease, unspecified: Secondary | ICD-10-CM | POA: Insufficient documentation

## 2015-07-14 DIAGNOSIS — B182 Chronic viral hepatitis C: Secondary | ICD-10-CM

## 2015-07-14 DIAGNOSIS — E785 Hyperlipidemia, unspecified: Secondary | ICD-10-CM

## 2015-07-14 LAB — LIPID PANEL
CHOL/HDL RATIO: 3.1 ratio (ref <=5.0)
Cholesterol: 213 mg/dL — ABNORMAL HIGH (ref 125–200)
HDL: 68 mg/dL (ref >=46)
LDL Cholesterol: 126 mg/dL (ref <130)
TRIGLYCERIDES: 94 mg/dL (ref <150)
VLDL: 19 mg/dL (ref <30)

## 2015-07-14 LAB — COMPLETE METABOLIC PANEL WITH GFR
ALT: 28 U/L (ref 6–29)
AST: 24 U/L (ref 10–35)
Albumin: 3.8 g/dL (ref 3.6–5.1)
Alkaline Phosphatase: 56 U/L (ref 33–130)
BILIRUBIN TOTAL: 0.2 mg/dL (ref 0.2–1.2)
BUN: 9 mg/dL (ref 7–25)
CO2: 27 mmol/L (ref 20–31)
Calcium: 9 mg/dL (ref 8.6–10.4)
Chloride: 107 mmol/L (ref 98–110)
Creat: 0.85 mg/dL (ref 0.50–0.99)
GFR, EST NON AFRICAN AMERICAN: 74 mL/min (ref >=60)
GFR, Est African American: 85 mL/min (ref >=60)
GLUCOSE: 117 mg/dL — AB (ref 65–99)
Potassium: 3.4 mmol/L — ABNORMAL LOW (ref 3.5–5.3)
Sodium: 141 mmol/L (ref 135–146)
TOTAL PROTEIN: 6.6 g/dL (ref 6.1–8.1)

## 2015-07-14 LAB — HEMOGLOBIN: HEMOGLOBIN: 10.5 g/dL — AB (ref 12.0–15.0)

## 2015-07-14 LAB — HEMATOCRIT: HCT: 33.5 % — ABNORMAL LOW (ref 36.0–46.0)

## 2015-07-14 NOTE — Progress Notes (Signed)
BP 148/82 mmHg  Pulse 65  Temp(Src) 97.3 F (36.3 C)  Ht '5\' 4"'$  (1.626 m)  Wt 129 lb 6.4 oz (58.695 kg)  BMI 22.20 kg/m2  SpO2 98%   Subjective:    Patient ID: Cynthia [name], female    DOB: 04-20-1951, 65 y.o.   MRN: [id number]  HPI: Cynthia [name] is a 65 y.o. female presenting on 07/14/2015 for Hypertension and Hyperlipidemia   HPI Chief Complaint  Patient presents with  . Hypertension    pt has not taken her BP med this morning  . Hyperlipidemia    Pt is trying to quit smoking for new year.  She hasn't smoked since 07/10/15.  Pt still going to [facility] care.  Pt reports some blood on stool 2 days last week.  She was a little bit constipated.  EGD and colonoscopy reports from 2016 reviewed.  Relevant past medical, surgical, family and social history reviewed and updated as indicated. Interim medical history since our last visit reviewed. Allergies and medications reviewed and updated.  Current outpatient prescriptions:  .  albuterol (PROVENTIL) (2.5 MG/3ML) 0.083% nebulizer solution, Take 2.5 mg by nebulization every 6 (six) hours as needed for wheezing or shortness of breath., Disp: , Rfl:  .  amLODipine (NORVASC) 5 MG tablet, Take 5 mg by mouth daily., Disp: , Rfl:  .  butalbital-acetaminophen-caffeine (FIORICET) 50-325-40 MG tablet, Take 1-2 tablets by mouth every 8 (eight) hours as needed for headache., Disp: 20 tablet, Rfl: 0 .  CALCIUM PO, Take 1 tablet by mouth daily., Disp: , Rfl:  .  clorazepate (TRANXENE) 7.5 MG tablet, Take 7.5 mg by mouth 2 (two) times daily as needed for anxiety., Disp: , Rfl:  .  ferrous sulfate 325 (65 FE) MG tablet, Take 325 mg by mouth daily with breakfast., Disp: , Rfl:  .  FLUoxetine (PROZAC) 40 MG capsule, Take 40 mg by mouth daily. , Disp: , Rfl:  .  LORazepam (ATIVAN) 0.5 MG tablet, Take 0.5 mg by mouth every 12 (twelve) hours as needed for anxiety. , Disp: , Rfl:  .  mirtazapine (REMERON) 15 MG tablet, Take 15 mg by  mouth at bedtime., Disp: , Rfl:  .  Multiple Vitamin (MULTIVITAMIN WITH MINERALS) TABS tablet, Take 1 tablet by mouth daily., Disp: , Rfl:  .  nitroGLYCERIN (NITROSTAT) 0.4 MG SL tablet, Place 0.4 mg under the tongue every 5 (five) minutes as needed for chest pain., Disp: , Rfl:  .  omeprazole (PRILOSEC) 20 MG capsule, Take 1 capsule (20 mg total) by mouth 2 (two) times daily before a meal. FOR 3 MOS THEN ONCE DAILY, Disp: 60 capsule, Rfl: 11 .  propranolol (INDERAL) 80 MG tablet, Take 80 mg by mouth 2 (two) times daily., Disp: , Rfl:  .  simvastatin (ZOCOR) 20 MG tablet, TAKE 1 TABLET BY MOUTH EVERY NIGHT AT BEDTIME FOR CHOLESTEROL, Disp: 30 tablet, Rfl: 2 .  traZODone (DESYREL) 50 MG tablet, Take 25 mg by mouth at bedtime as needed for sleep., Disp: , Rfl:  .  zinc gluconate 50 MG tablet, Take 50 mg by mouth daily., Disp: , Rfl:  .  budesonide (PULMICORT) 180 MCG/ACT inhaler, Inhale 2 puffs into the lungs 2 (two) times daily. (Patient not taking: Reported on 07/14/2015), Disp: 3 Inhaler, Rfl: 3   Review of Systems  Constitutional: Negative for fever, chills, diaphoresis, appetite change, fatigue and unexpected weight change.  HENT: Positive for sneezing. Negative for congestion, dental problem, drooling,  ear pain, facial swelling, hearing loss, mouth sores, sore throat, trouble swallowing and voice change.   Eyes: Negative for pain, discharge, redness, itching and visual disturbance.  Respiratory: Positive for cough and shortness of breath. Negative for choking and wheezing.   Cardiovascular: Negative for chest pain, palpitations and leg swelling.  Gastrointestinal: Positive for abdominal pain. Negative for vomiting, diarrhea, constipation and blood in stool.  Endocrine: Negative for cold intolerance, heat intolerance and polydipsia.  Genitourinary: Negative for dysuria, hematuria and decreased urine volume.  Musculoskeletal: Positive for arthralgias. Negative for back pain and gait problem.   Skin: Negative for rash.  Allergic/Immunologic: Negative for environmental allergies.  Neurological: Positive for headaches. Negative for seizures, syncope and light-headedness.  Hematological: Negative for adenopathy.  Psychiatric/Behavioral: Negative for suicidal ideas, dysphoric mood and agitation. The patient is not nervous/anxious.     Per HPI unless specifically indicated above     Objective:    BP 148/82 mmHg  Pulse 65  Temp(Src) 97.3 F (36.3 C)  Ht '5\' 4"'$  (1.626 m)  Wt 129 lb 6.4 oz (58.695 kg)  BMI 22.20 kg/m2  SpO2 98%  Wt Readings from Last 3 Encounters:  07/14/15 129 lb 6.4 oz (58.695 kg)  04/13/15 117 lb (53.071 kg)  03/31/15 113 lb (51.256 kg)    Physical Exam  Constitutional: She is oriented to person, place, and time. She appears well-developed and well-nourished.  HENT:  Head: Normocephalic and atraumatic.  Neck: Neck supple.  Cardiovascular: Normal rate and regular rhythm.   Pulmonary/Chest: Effort normal and breath sounds normal.  Abdominal: Soft. Bowel sounds are normal. She exhibits no mass. There is no tenderness.  Musculoskeletal: She exhibits no edema.  Lymphadenopathy:    She has no cervical adenopathy.  Neurological: She is alert and oriented to person, place, and time.  Skin: Skin is warm and dry.  Psychiatric: She has a normal mood and affect. Her behavior is normal.  Vitals reviewed.       Assessment & Plan:   Encounter Diagnoses  Name Primary?  . Essential hypertension, benign Yes  . Hyperlipidemia   . Chronic hepatitis C without hepatic coma ([place])   . Anemia, unspecified anemia type   . Chronic obstructive pulmonary disease, unspecified COPD type ([place])     -Pt needs to contact GI to f/u on her Hep C treatment.  She doesn't know when her [facility] discount expires so she is given another application. -check labs this week to check lipids and anemia.  Will call with results -get pt pulmicort from [facility] -continue current meds. Pt  counseled to make sure she takes her bp med every day -counseled on constipation and gave reading information -f/u 3 mo. RTO sooner prn

## 2015-07-14 NOTE — Patient Instructions (Signed)

## 2015-07-16 ENCOUNTER — Ambulatory Visit: Payer: Self-pay | Admitting: Gastroenterology

## 2015-07-19 ENCOUNTER — Ambulatory Visit: Payer: Self-pay | Admitting: Gastroenterology

## 2015-07-22 ENCOUNTER — Other Ambulatory Visit: Payer: Self-pay | Admitting: Physician Assistant

## 2015-07-22 MED ORDER — PROPRANOLOL HCL 80 MG PO TABS: 80.0000 mg | ORAL_TABLET | Freq: Two times a day (BID) | ORAL | Status: DC

## 2015-08-02 ENCOUNTER — Other Ambulatory Visit: Payer: Self-pay | Admitting: Physician Assistant

## 2015-08-02 ENCOUNTER — Ambulatory Visit (INDEPENDENT_AMBULATORY_CARE_PROVIDER_SITE_OTHER): Payer: Self-pay | Admitting: Gastroenterology

## 2015-08-02 ENCOUNTER — Encounter: Payer: Self-pay | Admitting: Gastroenterology

## 2015-08-02 VITALS — BP 143/79 | HR 75 | Temp 97.9°F | Ht 64.0 in | Wt 130.8 lb

## 2015-08-02 DIAGNOSIS — B182 Chronic viral hepatitis C: Secondary | ICD-10-CM

## 2015-08-02 DIAGNOSIS — B1920 Unspecified viral hepatitis C without hepatic coma: Secondary | ICD-10-CM

## 2015-08-02 DIAGNOSIS — K2970 Gastritis, unspecified, without bleeding: Secondary | ICD-10-CM | POA: Insufficient documentation

## 2015-08-02 MED ORDER — ALBUTEROL SULFATE (2.5 MG/3ML) 0.083% IN NEBU: 2.5000 mg | INHALATION_SOLUTION | Freq: Four times a day (QID) | RESPIRATORY_TRACT | Status: DC | PRN

## 2015-08-02 MED ORDER — AMLODIPINE BESYLATE 5 MG PO TABS: 5.0000 mg | ORAL_TABLET | Freq: Every day | ORAL | Status: DC

## 2015-08-02 MED ORDER — BUDESONIDE 180 MCG/ACT IN AEPB: 2.0000 | INHALATION_SPRAY | Freq: Two times a day (BID) | RESPIRATORY_TRACT | Status: DC

## 2015-08-02 MED ORDER — OMEPRAZOLE 20 MG PO CPDR: 20.0000 mg | DELAYED_RELEASE_CAPSULE | Freq: Every day | ORAL | Status: DC

## 2015-08-02 NOTE — Progress Notes (Signed)
Please let patient know that we need to reduce her omeprazole to once daily dosing. When she starts her Harvoni she will only be able to take omeprazole '20mg'$  daily (not twice per day) and she will have to take at same time with the [place]. VERY IMPORTANT OR IT CAN MAKE THE HARVONI LESS EFFECTIVE.

## 2015-08-02 NOTE — Progress Notes (Signed)
Pt is aware.  

## 2015-08-02 NOTE — Progress Notes (Signed)
Primary Care Physician: Soyla Dryer, PA-C  Primary Gastroenterologist:  Barney Drain, MD   Chief Complaint  Patient presents with  . Follow-up    HPI: Cynthia [name] is a 64 y.o. female here for follow-up. She has a history of hepatitis C. Underwent EGD and colonoscopy back in August for anorexia, weight loss, rectal bleeding. 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 Treated with amoxicillin, Biaxin, PPI twice a day. She had small internal hemorrhoids but otherwise unremarkable colonoscopy. Next colonoscopy due in 10 years.  Ultrasound with elastography showed diffuse hepatic steatosis and Metavir fibrosis score F2 and some F3. Last HCV RNA was in August 2016 with 408144 international units per mL. Genotype 1.  Prilosec BID. Yesterday, some heartburn. Some upper abdominal pain with meals not bad. Weight up 15 pounds since we last saw her. She has some left hip pain since a fall several months back. Needs to see Dr. Aline Brochure but hasn't done it yet. BM ok with prune juice as needed. No melena, brbpr. She is ready to get started on HCV treatment. She never completed patient assistance forms last fall.   Current Outpatient Prescriptions  Medication Sig Dispense Refill  . amLODipine (NORVASC) 5 MG tablet Take 5 mg by mouth daily.    . budesonide (PULMICORT) 180 MCG/ACT inhaler Inhale 2 puffs into the lungs 2 (two) times daily. 3 Inhaler 3  . butalbital-acetaminophen-caffeine (FIORICET) 50-325-40 MG tablet Take 1-2 tablets by mouth every 8 (eight) hours as needed for headache. 20 tablet 0  . CALCIUM PO Take 1 tablet by mouth daily.    . ferrous sulfate 325 (65 FE) MG tablet Take 325 mg by mouth daily with breakfast. Reported on 08/02/2015    . FLUoxetine (PROZAC) 40 MG capsule Take 40 mg by mouth daily.     Marland Kitchen LORazepam (ATIVAN) 0.5 MG tablet Take 0.5 mg by mouth  every 12 (twelve) hours as needed for anxiety.     . mirtazapine (REMERON) 15 MG tablet Take 15 mg by mouth at bedtime.    . Multiple Vitamin (MULTIVITAMIN WITH MINERALS) TABS tablet Take 1 tablet by mouth daily.    . nitroGLYCERIN (NITROSTAT) 0.4 MG SL tablet Place 0.4 mg under the tongue every 5 (five) minutes as needed for chest pain.    Marland Kitchen omeprazole (PRILOSEC) 20 MG capsule Take 1 capsule (20 mg total) by mouth 2 (two) times daily before a meal. FOR 3 MOS THEN ONCE DAILY 60 capsule 11  . propranolol (INDERAL) 80 MG tablet Take 1 tablet (80 mg total) by mouth 2 (two) times daily. 180 tablet 1  . simvastatin (ZOCOR) 20 MG tablet TAKE 1 TABLET BY MOUTH EVERY NIGHT AT BEDTIME FOR CHOLESTEROL 30 tablet 2  . traZODone (DESYREL) 50 MG tablet Take 25 mg by mouth at bedtime as needed for sleep.    Marland Kitchen zinc gluconate 50 MG tablet Take 50 mg by mouth daily. Reported on 08/02/2015    . albuterol (PROVENTIL) (2.5 MG/3ML) 0.083% nebulizer solution Take 2.5 mg by nebulization every 6 (six) hours as needed for wheezing or shortness of breath.     No current facility-administered medications for this visit.    Allergies as of 08/02/2015 - Review Complete 08/02/2015  Allergen Reaction Noted  . Aspirin  11/11/2008   Past Medical History  Diagnosis Date  . Migraine   . Hypertension   .  Bronchitis   . High blood cholesterol level   . PTSD (post-traumatic stress disorder)   . COPD (chronic obstructive pulmonary disease) ([place])   . Pneumonia   . CHF (congestive heart failure) ([place])   . GERD (gastroesophageal reflux disease)   . Angina pectoris ([place])   . Arthritis   . Bronchitis   . Migraines   . Anxiety   . Depression   . HCV (hepatitis C virus)    Past Surgical History  Procedure Laterality Date  . Left arm      ? nerve repaired, " it was pinched".  . Tonsillectomy    . Colonoscopy with propofol N/A 02/09/2015    SLF: 1. the examined terminal ileum appeared to be normal 2. the colonic mucosa  appeared normal 3. small internal hemorrhoids  . Esophagogastroduodenoscopy (egd) with propofol N/A 02/09/2015    SLF: 1. stricture at the gastroesophageal junction 2. mild non-erosive gastritis   . Savory dilation N/A 02/09/2015    Procedure: SAVORY DILATION 12.69m, 135m [id number]: SandDanie Binder;  Location: [facility];  Service: Endoscopy;  Laterality: N/A;  . Esophageal biopsy  02/09/2015    Procedure: GASTRIC BIOPSIES ;  Surgeon: SandDanie Binder;  Location: [facility];  Service: Endoscopy;;  . Left elbow surgery Left    Family History  Problem Relation Age of Onset  . Heart failure Father   . Hypertension Father   . Stroke Father   . Colon cancer Father     greater than age 25  79Cancer Father   . Cancer Mother   . Seizures Sister    Social History   Social History  . Marital Status: Married    Spouse Name: N/A  . Number of Children: N/A  . Years of Education: N/A   Social History Main Topics  . Smoking status: Former Smoker -- 0.25 packs/day for 42 years    Types: Cigarettes    Start date: 09/29/1972    Quit date: 07/11/2015  . Smokeless tobacco: Never Used     Comment: 3 ciggs per day  . Alcohol Use: No  . Drug Use: Yes    Special: "Crack" cocaine     Comment: hx of smoking crack cocaine last 5-6 years ago  . Sexual Activity: No   Other Topics Concern  . None   Social History Narrative     ROS:  General: Negative for anorexia, weight loss, fever, chills, fatigue, weakness. ENT: Negative for hoarseness, difficulty swallowing , nasal congestion. CV: Negative for chest pain, angina, palpitations, dyspnea on exertion, peripheral edema.  Respiratory: Negative for dyspnea at rest, dyspnea on exertion, cough, sputum, wheezing.  GI: See history of present illness. GU:  Negative for dysuria, hematuria, urinary incontinence, urinary frequency, nocturnal urination.  Endo: Negative for unusual weight change.    Physical Examination:   BP 143/79 mmHg  Pulse  75  Temp(Src) 97.9 F (36.6 C) (Oral)  Ht '5\' 4"'$  (1.626 m)  Wt 130 lb 12.8 oz (59.33 kg)  BMI 22.44 kg/m2  General: Well-nourished, well-developed in no acute distress.  Eyes: No icterus. Mouth: Oropharyngeal mucosa moist and pink , no lesions erythema or exudate. Lungs: Clear to auscultation bilaterally.  Heart: Regular rate and rhythm, no murmurs rubs or gallops.  Abdomen: Bowel sounds are normal, nontender, nondistended, no hepatosplenomegaly or masses, no abdominal bruits or hernia , no rebound or guarding.   Extremities: No lower extremity edema. No clubbing or deformities. Neuro: Alert and  oriented x 4   Skin: Warm and dry, no jaundice.   Psych: Alert and cooperative, normal mood and affect.  Labs:  Lab Results  Component Value Date   CREATININE 0.85 07/14/2015   BUN 9 07/14/2015   NA 141 07/14/2015   K 3.4* 07/14/2015   CL 107 07/14/2015   CO2 27 07/14/2015   Lab Results  Component Value Date   ALT 28 07/14/2015   AST 24 07/14/2015   ALKPHOS 56 07/14/2015   BILITOT 0.2 07/14/2015   Lab Results  Component Value Date   WBC 8.1 04/12/2015   HGB 10.5* 07/14/2015   HCT 33.5* 07/14/2015   MCV 84.9 04/12/2015   PLT 358 04/12/2015   Lab Results  Component Value Date   INR 1.27 01/11/2013    Imaging Studies: No results found.

## 2015-08-02 NOTE — Progress Notes (Signed)
cc'ed to pcp °

## 2015-08-02 NOTE — Assessment & Plan Note (Signed)
HCV, genotype 1, treatment naive. F2/some F3 on elastography. She will require 12 weeks of therapy of Harvoni once daily.   We will have to get her down to once daily omeprazole prior to starting Harvoni and she will need to take omeprazole at the same time of her Harvoni.  We discussed compliance with medication, and labs. She is aware to notify our office prior to any medication additions either RX or OTC.

## 2015-08-02 NOTE — Assessment & Plan Note (Signed)
Some mild pp abdominal pain. Anorexia has improved. Her weight has gone up 15 pounds. Continue to monitor for now. If ongoing, we could consider checking for persistent H.pylori but she will have to come off of PPI therapy to do it.

## 2015-08-02 NOTE — Patient Instructions (Signed)
1. Please complete Harvoni patient assistance forms so we can get you approved for treatment for Hep C. 2. [facility] know we need an additional lab done for Hep C approval. They will make arrangements for you to have labs done.  3. Read over the Harvoni information booklet provided today. It is very important that you take the medication EVERY DAY AT THE SAME TIME for 12 weeks. LET us KNOW IF YOUR MEDICATIONS CHANGE AND DO NOT START ANY NEW MEDICATIONS BY PRESCRIPTION OR OVER THE COUNTER WITHOUT APPROVING WITH [place] FIRST.

## 2015-08-03 LAB — PROTIME-INR
INR: 1 (ref <1.50)
Prothrombin Time: 13.3 seconds (ref 11.6–15.2)

## 2015-08-04 ENCOUNTER — Other Ambulatory Visit: Payer: Self-pay | Admitting: Physician Assistant

## 2015-08-04 MED ORDER — BUTALBITAL-APAP-CAFFEINE 50-325-40 MG PO TABS: 1.0000 | ORAL_TABLET | Freq: Three times a day (TID) | ORAL | Status: DC | PRN

## 2015-08-05 NOTE — Progress Notes (Signed)
Quick Note:  INR normal. Proceed with Harvoni, patient given patient assistance forms at time of OV. ______

## 2015-08-18 ENCOUNTER — Telehealth: Payer: Self-pay

## 2015-08-18 NOTE — Telephone Encounter (Signed)
Noted. Ane we need to remind patient of the following:  When she starts her Harvoni she will only be able to take omeprazole '20mg'$  daily (not twice per day) and she will have to take at same time with the [place]. VERY IMPORTANT OR IT CAN MAKE THE HARVONI LESS EFFECTIVE.

## 2015-08-18 NOTE — Telephone Encounter (Signed)
Pt came by the office and said she did not have money to buy the [facility] and she wants to wait until she gets her disability. Per Almyra Free, I told pt that she needs to bring in her paperwork for the Patient Assistance for the [facility] and she will not have to pay anything for it. Almyra Free has given her the paperwork twice, and she needs to complete and return. She said she will try to get it back to the office this afternoon.

## 2015-08-18 NOTE — Telephone Encounter (Signed)
Noted  

## 2015-08-23 ENCOUNTER — Other Ambulatory Visit: Payer: Self-pay | Admitting: Physician Assistant

## 2015-08-23 MED ORDER — NITROGLYCERIN 0.4 MG SL SUBL: 0.4000 mg | SUBLINGUAL_TABLET | SUBLINGUAL | Status: DC | PRN

## 2015-09-06 ENCOUNTER — Other Ambulatory Visit: Payer: Self-pay | Admitting: Physician Assistant

## 2015-09-06 MED ORDER — BUTALBITAL-APAP-CAFFEINE 50-325-40 MG PO TABS: 1.0000 | ORAL_TABLET | Freq: Three times a day (TID) | ORAL | Status: DC | PRN

## 2015-09-07 NOTE — Progress Notes (Signed)
Quick Note:    noted  ______

## 2015-09-28 ENCOUNTER — Other Ambulatory Visit: Payer: Self-pay | Admitting: Physician Assistant

## 2015-09-28 MED ORDER — AMLODIPINE BESYLATE 5 MG PO TABS: 5.0000 mg | ORAL_TABLET | Freq: Every day | ORAL | Status: DC

## 2015-09-29 NOTE — Telephone Encounter (Signed)
Forms completed. Would advise patient to come in for ov prior to starting medication, once we have medication on hand.

## 2015-09-29 NOTE — Telephone Encounter (Signed)
Pt is aware.  

## 2015-09-29 NOTE — Telephone Encounter (Signed)
Forms have been faxed to the pt assistance co.

## 2015-09-29 NOTE — Telephone Encounter (Signed)
Received fax from pt assistance for harvoni. Pt has been pre-qualified for the program but they need their rx form filled out, signed and returned to them within 30 days. I have filled out my part of the form and placed it on [facility] desk.

## 2015-10-05 ENCOUNTER — Other Ambulatory Visit: Payer: Self-pay | Admitting: Physician Assistant

## 2015-10-05 MED ORDER — BUTALBITAL-APAP-CAFFEINE 50-325-40 MG PO TABS: 1.0000 | ORAL_TABLET | Freq: Three times a day (TID) | ORAL | Status: DC | PRN

## 2015-10-05 NOTE — Telephone Encounter (Signed)
MADE APPOINTMENT AND CALLED PATIENT

## 2015-10-05 NOTE — Telephone Encounter (Signed)
pts harvoni will be delivered on 10/07/15. Pt needs ov. Magda Paganini said it was ok to put her in an urgent spot next week. Please schedule pt.

## 2015-10-07 ENCOUNTER — Other Ambulatory Visit: Payer: Self-pay | Admitting: Physician Assistant

## 2015-10-12 ENCOUNTER — Other Ambulatory Visit: Payer: Self-pay | Admitting: Physician Assistant

## 2015-10-12 ENCOUNTER — Ambulatory Visit (HOSPITAL_COMMUNITY)
Admission: RE | Admit: 2015-10-12 | Discharge: 2015-10-12 | Disposition: A | Discharge status: Home / Self Care | Payer: Self-pay | Source: Ambulatory Visit | Attending: Physician Assistant | Admitting: Physician Assistant

## 2015-10-12 ENCOUNTER — Ambulatory Visit: Payer: Self-pay | Admitting: Physician Assistant

## 2015-10-12 ENCOUNTER — Encounter: Payer: Self-pay | Admitting: Physician Assistant

## 2015-10-12 VITALS — BP 136/80 | HR 78 | Temp 97.7°F | Ht 64.0 in | Wt 142.9 lb

## 2015-10-12 DIAGNOSIS — I10 Essential (primary) hypertension: Secondary | ICD-10-CM

## 2015-10-12 DIAGNOSIS — E785 Hyperlipidemia, unspecified: Secondary | ICD-10-CM

## 2015-10-12 DIAGNOSIS — M25551 Pain in right hip: Secondary | ICD-10-CM | POA: Insufficient documentation

## 2015-10-12 DIAGNOSIS — F411 Generalized anxiety disorder: Secondary | ICD-10-CM

## 2015-10-12 DIAGNOSIS — B182 Chronic viral hepatitis C: Secondary | ICD-10-CM

## 2015-10-12 DIAGNOSIS — D649 Anemia, unspecified: Secondary | ICD-10-CM

## 2015-10-12 LAB — COMPLETE METABOLIC PANEL WITH GFR
ALT: 31 U/L — AB (ref 6–29)
AST: 27 U/L (ref 10–35)
Albumin: 4.1 g/dL (ref 3.6–5.1)
Alkaline Phosphatase: 68 U/L (ref 33–130)
BUN: 8 mg/dL (ref 7–25)
CALCIUM: 9.3 mg/dL (ref 8.6–10.4)
CHLORIDE: 106 mmol/L (ref 98–110)
CO2: 25 mmol/L (ref 20–31)
Creat: 0.77 mg/dL (ref 0.50–0.99)
GFR, Est Non African American: 82 mL/min (ref >=60)
Glucose, Bld: 114 mg/dL — ABNORMAL HIGH (ref 65–99)
POTASSIUM: 4 mmol/L (ref 3.5–5.3)
Sodium: 141 mmol/L (ref 135–146)
Total Bilirubin: 0.3 mg/dL (ref 0.2–1.2)
Total Protein: 7.1 g/dL (ref 6.1–8.1)

## 2015-10-12 LAB — HEMOGLOBIN: Hemoglobin: 11.3 g/dL — ABNORMAL LOW (ref 11.7–15.5)

## 2015-10-12 LAB — HEMATOCRIT: HCT: 35.1 % (ref 35.0–45.0)

## 2015-10-12 NOTE — Progress Notes (Signed)
BP 136/80 mmHg  Pulse 78  Temp(Src) 97.7 F (36.5 C)  Ht '5\' 4"'$  (1.626 m)  Wt 142 lb 14.4 oz (64.819 kg)  BMI 24.52 kg/m2  SpO2 99%   Subjective:    Patient ID: Cynthia [name], female    DOB: 1951/04/22, 65 y.o.   MRN: [id number]  HPI: Cynthia [name] is a 66 y.o. female presenting on 10/12/2015 for Hypertension; Hyperlipidemia; and Anemia   HPI   Pt is still smoking.  At last OV she had been almost a week without but she started back.  She is still trying to quit.   Pt has appt with GI tomorrow- she hopes to get started on harvoni for hepatitis C.  She is still going to [facility].    Pt has gained some weight.  She is eating better.    Pt complains of R hip pain.  Says it's been bothering her since she fell 2 years ago although she has not mentioned this at previous appointments.  Relevant past medical, surgical, family and social history reviewed and updated as indicated. Interim medical history since our last visit reviewed. Allergies and medications reviewed and updated.   Current outpatient prescriptions:  .  albuterol (PROVENTIL) (2.5 MG/3ML) 0.083% nebulizer solution, Take 3 mLs (2.5 mg total) by nebulization every 6 (six) hours as needed for wheezing or shortness of breath., Disp: 150 mL, Rfl: 1 .  amLODipine (NORVASC) 5 MG tablet, Take 1 tablet (5 mg total) by mouth daily., Disp: 30 tablet, Rfl: 4 .  budesonide (PULMICORT) 180 MCG/ACT inhaler, Inhale 2 puffs into the lungs 2 (two) times daily., Disp: 3 Inhaler, Rfl: 2 .  butalbital-acetaminophen-caffeine (FIORICET) 50-325-40 MG tablet, Take 1-2 tablets by mouth every 8 (eight) hours as needed for headache., Disp: 20 tablet, Rfl: 0 .  CALCIUM PO, Take 1 tablet by mouth daily., Disp: , Rfl:  .  ferrous sulfate 325 (65 FE) MG tablet, Take 325 mg by mouth daily with breakfast. Reported on 08/02/2015, Disp: , Rfl:  .  FLUoxetine (PROZAC) 40 MG capsule, Take 40 mg by mouth daily. , Disp: , Rfl:  .  LORazepam  (ATIVAN) 0.5 MG tablet, Take 0.5 mg by mouth every 12 (twelve) hours as needed for anxiety. , Disp: , Rfl:  .  mirtazapine (REMERON) 15 MG tablet, Take 15 mg by mouth at bedtime., Disp: , Rfl:  .  Multiple Vitamin (MULTIVITAMIN WITH MINERALS) TABS tablet, Take 1 tablet by mouth daily., Disp: , Rfl:  .  nitroGLYCERIN (NITROSTAT) 0.4 MG SL tablet, Place 1 tablet (0.4 mg total) under the tongue every 5 (five) minutes as needed for chest pain., Disp: 25 tablet, Rfl: 6 .  omeprazole (PRILOSEC) 20 MG capsule, Take 1 capsule (20 mg total) by mouth daily., Disp: 90 capsule, Rfl: 1 .  propranolol (INDERAL) 80 MG tablet, Take 1 tablet (80 mg total) by mouth 2 (two) times daily., Disp: 180 tablet, Rfl: 1 .  simvastatin (ZOCOR) 20 MG tablet, TAKE 1 TABLET BY MOUTH EVERY NIGHT AT BEDTIME FOR CHOLESTEROL, Disp: 30 tablet, Rfl: 5 .  traZODone (DESYREL) 50 MG tablet, Take 25 mg by mouth at bedtime as needed for sleep., Disp: , Rfl:  .  zinc gluconate 50 MG tablet, Take 50 mg by mouth daily. Reported on 08/02/2015, Disp: , Rfl:    Review of Systems  Constitutional: Positive for diaphoresis, appetite change and unexpected weight change. Negative for fever, chills and fatigue.  HENT: Negative for congestion,  dental problem, drooling, ear pain, facial swelling, hearing loss, mouth sores, sneezing, sore throat, trouble swallowing and voice change.   Eyes: Negative for pain, discharge, redness, itching and visual disturbance.  Respiratory: Positive for cough, shortness of breath and wheezing. Negative for choking.   Cardiovascular: Negative for chest pain, palpitations and leg swelling.  Gastrointestinal: Positive for abdominal pain and constipation. Negative for vomiting, diarrhea and blood in stool.  Endocrine: Negative for cold intolerance, heat intolerance and polydipsia.  Genitourinary: Negative for dysuria, hematuria and decreased urine volume.  Musculoskeletal: Positive for arthralgias. Negative for back pain  and gait problem.  Skin: Negative for rash.  Allergic/Immunologic: Negative for environmental allergies.  Neurological: Negative for seizures, syncope, light-headedness and headaches.  Hematological: Negative for adenopathy.  Psychiatric/Behavioral: Positive for dysphoric mood and agitation. Negative for suicidal ideas. The patient is not nervous/anxious.     Per HPI unless specifically indicated above     Objective:    BP 136/80 mmHg  Pulse 78  Temp(Src) 97.7 F (36.5 C)  Ht '5\' 4"'$  (1.626 m)  Wt 142 lb 14.4 oz (64.819 kg)  BMI 24.52 kg/m2  SpO2 99%  Wt Readings from Last 3 Encounters:  10/12/15 142 lb 14.4 oz (64.819 kg)  08/02/15 130 lb 12.8 oz (59.33 kg)  07/14/15 129 lb 6.4 oz (58.695 kg)    Physical Exam  Constitutional: She is oriented to person, place, and time. She appears well-developed and well-nourished.  HENT:  Head: Normocephalic and atraumatic.  Neck: Neck supple.  Cardiovascular: Normal rate and regular rhythm.   Pulmonary/Chest: Effort normal and breath sounds normal.  Abdominal: Soft. Bowel sounds are normal. She exhibits no mass. There is no hepatosplenomegaly. There is no tenderness.  Musculoskeletal: She exhibits no edema.  Lymphadenopathy:    She has no cervical adenopathy.  Neurological: She is alert and oriented to person, place, and time.  Skin: Skin is warm and dry.  Psychiatric: She has a normal mood and affect. Her behavior is normal.  Vitals reviewed.       Assessment & Plan:   Encounter Diagnoses  Name Primary?  . Essential hypertension, benign Yes  . Anemia, unspecified anemia type   . Right hip pain   . Chronic hepatitis C without hepatic coma ([place])   . Hyperlipidemia   . Anxiety state     -Check h/h, cmp today.  No lipid panel today- pt on 6 month cycle for that. -Xray hip- pt says she still has [facility]. -continue with [facility] for mental health -continue with GI for hepatitis -mammogram will be due in October this  year -counseled on smoking cessation -F/u 3 months.  RTO sooner prn

## 2015-10-13 ENCOUNTER — Telehealth: Payer: Self-pay | Admitting: Gastroenterology

## 2015-10-13 ENCOUNTER — Ambulatory Visit (INDEPENDENT_AMBULATORY_CARE_PROVIDER_SITE_OTHER): Payer: Self-pay | Admitting: Gastroenterology

## 2015-10-13 ENCOUNTER — Encounter: Payer: Self-pay | Admitting: Gastroenterology

## 2015-10-13 VITALS — BP 123/73 | HR 77 | Temp 97.9°F | Ht 64.0 in | Wt 139.0 lb

## 2015-10-13 DIAGNOSIS — B182 Chronic viral hepatitis C: Secondary | ICD-10-CM

## 2015-10-13 DIAGNOSIS — K219 Gastro-esophageal reflux disease without esophagitis: Secondary | ICD-10-CM | POA: Insufficient documentation

## 2015-10-13 NOTE — Assessment & Plan Note (Signed)
Doing very well on omeprazole 20 mg daily. She has history of H. pylori gastritis, in the future we need to determine whether or not it was eradicated. We will wait until after she has completed Harvoni therapy. She will have to come off of PPI therapy for at least 2 weeks prior to H. pylori stool antigen or breath test.

## 2015-10-13 NOTE — Patient Instructions (Signed)
1. Start Harvoni tomorrow morning. Please take it at the same time every day. Do not skip doses. Do not lose any of the medication.  2. You should TAKE HARVONI AT THE SAME TIME YOU TAKE THE OMEPRAZOLE. . Take omeprazole only once in the morning. If you have issues with your reflux, call me. 3. DO NOT START ANY NEW MEDICATIONS, EITHER OVER THE COUNTER OR PRESCRIPTION WITHOUT CONSULTING ME. SOME MEDICATIONS CAN CAUSE THE HARVONI NOT TO WORK. 4. You will have labs in four weeks. You will come back to see me in five weeks. Call with any questions or concerns.

## 2015-10-13 NOTE — Assessment & Plan Note (Signed)
HCV, genotype 1, treatment nave. F2/some F3. Plans for 12 weeks of therapy, Harvoni once daily. Current medication list appropriate without any evidence of interactions. Patient has been told to take omeprazole at the same time as Harvoni. She has been advised that if any of her medications changes, especially increased dosage, new medications, new over-the-counter medications she should let us know first to make sure there is no drug drug interactions.  She will have labs in 4 weeks, including CBC, CMET, HCV RNA quantitative. Return office visit with me in 5 weeks.  Patient complains of right hip pain. Have asked her to contact PCP Soyla Dryer PA-C) regarding this. I will also send her PCP message via EPIC per patient's request.

## 2015-10-13 NOTE — Telephone Encounter (Signed)
-----   Message from Soyla Dryer, [place] sent at 10/13/2015 12:44 PM EDT ----- Hi! Thanks for treating her hepatitis.  Planning to just have her take some IBU or naproxen for her hip.   Thanks. shannon  ----- Message -----    From: Mahala Menghini, PA-C    Sent: 10/13/2015  12:18 PM      To: Soyla Dryer, PA-C, Mahala Menghini, PA-C  Hi [place]! I saw this nice patient today. We are starting her HCV treatment tomorrow morning. She will be on Harvoni for 12 weeks. She voiced concerns about her right hip pain. I recommended she touch base with you for any potential pain management. I will need to know if any of her medications change as some do interfere with the Harvoni. Just giving you a heads up! Have a great day!  Magda Paganini

## 2015-10-13 NOTE — Progress Notes (Signed)
Primary Care Physician: Soyla Dryer, PA-C  Primary Gastroenterologist:  Barney Drain, MD   Chief Complaint  Patient presents with  . Follow-up    HPI: Cynthia [name] is a 65 y.o. female here For follow-up of hepatitis C, GERD. She was last seen in January. She has hepatitis C, genotype 1, treatment nave. F2/some F3 scores. Plans for Harvoni once daily for 12 weeks. She was fortunate enough to get medication through patient assistance program. Patient also has history of chronic GERD, H. pylori gastritis status post treatment but still needs to be checked for eradication in the future. Unable to do previously because she was symptomatic with GERD, postprandial abdominal pain and cannot come off of PPI therapy. In fact we had increased her dose temporarily.  She presents today to get started on Harvoni. We discussed all of her questions. She is now on omeprazole once daily and is doing well. Denies any GI symptoms. Utilize aspirin juice for constipation. Biggest concern is of chronic right hip pain for more than 2 years. X-rays yesterday showed degenerative changes.    Current Outpatient Prescriptions  Medication Sig Dispense Refill  . albuterol (PROVENTIL) (2.5 MG/3ML) 0.083% nebulizer solution Take 3 mLs (2.5 mg total) by nebulization every 6 (six) hours as needed for wheezing or shortness of breath. 150 mL 1  . amLODipine (NORVASC) 5 MG tablet Take 1 tablet (5 mg total) by mouth daily. 30 tablet 4  . budesonide (PULMICORT) 180 MCG/ACT inhaler Inhale 2 puffs into the lungs 2 (two) times daily. 3 Inhaler 2  . butalbital-acetaminophen-caffeine (FIORICET) 50-325-40 MG tablet Take 1-2 tablets by mouth every 8 (eight) hours as needed for headache. 20 tablet 0  . CALCIUM PO Take 1 tablet by mouth daily.    . ferrous sulfate 325 (65 FE) MG tablet Take 325 mg by mouth daily with breakfast. Reported on 08/02/2015    . FLUoxetine (PROZAC) 40 MG capsule Take 40 mg by mouth daily.     Marland Kitchen  LORazepam (ATIVAN) 0.5 MG tablet Take 0.5 mg by mouth every 12 (twelve) hours as needed for anxiety.     . mirtazapine (REMERON) 15 MG tablet Take 15 mg by mouth at bedtime.    . Multiple Vitamin (MULTIVITAMIN WITH MINERALS) TABS tablet Take 1 tablet by mouth daily.    . nitroGLYCERIN (NITROSTAT) 0.4 MG SL tablet Place 1 tablet (0.4 mg total) under the tongue every 5 (five) minutes as needed for chest pain. 25 tablet 6  . omeprazole (PRILOSEC) 20 MG capsule Take 1 capsule (20 mg total) by mouth daily. 90 capsule 1  . propranolol (INDERAL) 80 MG tablet Take 1 tablet (80 mg total) by mouth 2 (two) times daily. 180 tablet 1  . simvastatin (ZOCOR) 20 MG tablet TAKE 1 TABLET BY MOUTH EVERY NIGHT AT BEDTIME FOR CHOLESTEROL 30 tablet 5  . traZODone (DESYREL) 50 MG tablet Take 25 mg by mouth at bedtime as needed for sleep.    Marland Kitchen zinc gluconate 50 MG tablet Take 50 mg by mouth daily. Reported on 08/02/2015     No current facility-administered medications for this visit.    Allergies as of 10/13/2015 - Review Complete 10/12/2015  Allergen Reaction Noted  . Aspirin  11/11/2008    ROS:  General: Negative for anorexia, weight loss, fever, chills, fatigue, weakness. ENT: Negative for hoarseness, difficulty swallowing , nasal congestion. CV: Negative for chest pain, angina, palpitations, dyspnea on exertion, peripheral edema.  Respiratory: Negative for dyspnea at  rest, dyspnea on exertion, cough, sputum, wheezing.  GI: See history of present illness. GU:  Negative for dysuria, hematuria, urinary incontinence, urinary frequency, nocturnal urination.  Endo: Negative for unusual weight change.    Physical Examination:   BP 123/73 mmHg  Pulse 77  Temp(Src) 97.9 F (36.6 C) (Oral)  Ht '5\' 4"'$  (1.626 m)  Wt 139 lb (63.05 kg)  BMI 23.85 kg/m2  General: Well-nourished, well-developed in no acute distress.  Eyes: No icterus. Mouth: Oropharyngeal mucosa moist and pink , no lesions erythema or  exudate. Lungs: Clear to auscultation bilaterally.  Heart: Regular rate and rhythm, no murmurs rubs or gallops.  Abdomen: Bowel sounds are normal, nontender, nondistended, no hepatosplenomegaly or masses, no abdominal bruits or hernia , no rebound or guarding.   Extremities: No lower extremity edema. No clubbing or deformities. Neuro: Alert and oriented x 4   Skin: Warm and dry, no jaundice.   Psych: Alert and cooperative, normal mood and affect.  Labs:  Lab Results  Component Value Date   WBC 8.1 04/12/2015   HGB 11.3* 10/12/2015   HCT 35.1 10/12/2015   MCV 84.9 04/12/2015   PLT 358 04/12/2015   Lab Results  Component Value Date   CREATININE 0.77 10/12/2015   BUN 8 10/12/2015   NA 141 10/12/2015   K 4.0 10/12/2015   CL 106 10/12/2015   CO2 25 10/12/2015   Lab Results  Component Value Date   ALT 31* 10/12/2015   AST 27 10/12/2015   ALKPHOS 68 10/12/2015   BILITOT 0.3 10/12/2015   Lab Results  Component Value Date   INR 1.00 08/02/2015   INR 1.27 01/11/2013    Imaging Studies: Dg Hip Unilat With Pelvis 2-3 Views Right  10/12/2015  CLINICAL DATA:  Lateral right hip pain.  Fall 2 years ago. EXAM: DG HIP (WITH OR WITHOUT PELVIS) 2-3V RIGHT COMPARISON:  None. FINDINGS: Slight joint space narrowing and early spurring, symmetric bilaterally. SI joints are symmetric and unremarkable. No acute bony abnormality. Specifically, no fracture, subluxation, or dislocation. Soft tissues are intact. IMPRESSION: Early degenerative changes bilaterally.  No acute bony abnormality. Electronically Signed   By: Rolm Baptise M.D.   On: 10/12/2015 11:35

## 2015-10-13 NOTE — Progress Notes (Signed)
cc'ed to pcp °

## 2015-10-14 ENCOUNTER — Telehealth: Payer: Self-pay | Admitting: Gastroenterology

## 2015-10-14 NOTE — Telephone Encounter (Signed)
Noted  

## 2015-10-14 NOTE — Telephone Encounter (Signed)
Pt wanted to know if she could take tylenol or advil. Please call [phone]

## 2015-10-14 NOTE — Telephone Encounter (Signed)
I spoke to pt and she wanted to take something for her hip pain.  Almyra Free checked with Neil Crouch, PA and I told pt she could take either Tylenol or Ibuprofen. She is on Harvoni.  Pt is aware.

## 2015-10-28 ENCOUNTER — Telehealth: Payer: Self-pay

## 2015-10-28 NOTE — Telephone Encounter (Signed)
pts harvoni has arrived today. I have called the pt and informed her that it was here. She is aware that she needs to pick it up before she runs out and she is also aware that we are closed on Friday afternoons.   Routing to [facility] for [employer]

## 2015-10-29 NOTE — Telephone Encounter (Signed)
noted 

## 2015-11-01 ENCOUNTER — Ambulatory Visit: Payer: Self-pay | Admitting: Physician Assistant

## 2015-11-01 VITALS — BP 152/82

## 2015-11-01 DIAGNOSIS — I10 Essential (primary) hypertension: Secondary | ICD-10-CM

## 2015-11-01 NOTE — Patient Instructions (Addendum)
Pt was sent from [facility] with systolic BP 128. Checked BP on L arm with regular sized cuff 152/82. PA stated bp was improved and pt was to take meds daily and to f/u as scheduled.

## 2015-11-03 ENCOUNTER — Other Ambulatory Visit: Payer: Self-pay

## 2015-11-03 ENCOUNTER — Other Ambulatory Visit: Payer: Self-pay | Admitting: Gastroenterology

## 2015-11-03 DIAGNOSIS — D649 Anemia, unspecified: Secondary | ICD-10-CM

## 2015-11-03 DIAGNOSIS — B182 Chronic viral hepatitis C: Secondary | ICD-10-CM

## 2015-11-08 ENCOUNTER — Other Ambulatory Visit: Payer: Self-pay | Admitting: Physician Assistant

## 2015-11-08 MED ORDER — BUTALBITAL-APAP-CAFFEINE 50-325-40 MG PO TABS: 1.0000 | ORAL_TABLET | Freq: Three times a day (TID) | ORAL | Status: DC | PRN

## 2015-11-09 ENCOUNTER — Ambulatory Visit (INDEPENDENT_AMBULATORY_CARE_PROVIDER_SITE_OTHER): Payer: Self-pay | Admitting: Orthopedic Surgery

## 2015-11-09 ENCOUNTER — Other Ambulatory Visit: Payer: Self-pay | Admitting: Physician Assistant

## 2015-11-09 VITALS — BP 143/90 | Ht 64.0 in | Wt 139.0 lb

## 2015-11-09 DIAGNOSIS — D649 Anemia, unspecified: Secondary | ICD-10-CM

## 2015-11-09 DIAGNOSIS — M5441 Lumbago with sciatica, right side: Secondary | ICD-10-CM

## 2015-11-09 DIAGNOSIS — B182 Chronic viral hepatitis C: Secondary | ICD-10-CM

## 2015-11-09 MED ORDER — PREDNISONE 10 MG (48) PO TBPK: ORAL_TABLET | Freq: Every day | ORAL | Status: DC

## 2015-11-09 NOTE — Patient Instructions (Addendum)
Call aph therapy dept to schedule therapy visits [phone]

## 2015-11-09 NOTE — Progress Notes (Signed)
Patient ID: Cynthia [name], female   DOB: [birth year], 65 y.o.   MRN: [id number]  CC: right hip pain  HPI Cynthia [name] is a 65 y.o. female.  C/o right hip pain   Pain is near and in right iliac crest and groin as well as right lower back; she has radiation into the posterior part of the right leg  She has had pain for several months  Severe pain  Severe ache  No injury   No prior treatment other than tylenol  Review of Systems Review of Systems  Constitutional: Negative for chills and unexpected weight change.  Gastrointestinal: Negative for diarrhea and constipation.  Genitourinary: Negative for dysuria, frequency, enuresis and difficulty urinating.  Neurological: Positive for weakness and numbness.    Past Medical History  Diagnosis Date  . Migraine   . Hypertension   . Bronchitis   . High blood cholesterol level   . PTSD (post-traumatic stress disorder)   . COPD (chronic obstructive pulmonary disease) ([place])   . Pneumonia   . CHF (congestive heart failure) ([place])   . GERD (gastroesophageal reflux disease)   . Angina pectoris ([place])   . Arthritis   . Bronchitis   . Migraines   . Anxiety   . Depression   . HCV (hepatitis C virus)     Past Surgical History  Procedure Laterality Date  . Left arm      ? nerve repaired, " it was pinched".  . Tonsillectomy    . Colonoscopy with propofol N/A 02/09/2015    SLF: 1. the examined terminal ileum appeared to be normal 2. the colonic mucosa appeared normal 3. small internal hemorrhoids  . Esophagogastroduodenoscopy (egd) with propofol N/A 02/09/2015    SLF: 1. stricture at the gastroesophageal junction 2. mild non-erosive gastritis   . Savory dilation N/A 02/09/2015    Procedure: SAVORY DILATION 12.65m, 148m 1550m48m85mSurgeon: SandDanie Binder;  Location: [facility];  Service: Endoscopy;  Laterality: N/A;  . Biopsy  02/09/2015    Procedure: GASTRIC BIOPSIES ;  Surgeon: SandDanie Binder;  Location: [facility];  Service:  Endoscopy;;  . Left elbow surgery Left     Family History  Problem Relation Age of Onset  . Heart failure Father   . Hypertension Father   . Stroke Father   . Colon cancer Father     greater than age 40  52Cancer Father   . Cancer Mother   . Seizures Sister    was reviewed  Social History Social History  Substance Use Topics  . Smoking status: Current Every Day Smoker -- 0.25 packs/day for 42 years    Types: Cigarettes    Start date: 09/29/1972  . Smokeless tobacco: Never Used     Comment: about 2 cigs per day  . Alcohol Use: No    Allergies  Allergen Reactions  . Aspirin     REACTION: Stomach upset    Current Outpatient Prescriptions  Medication Sig Dispense Refill  . albuterol (PROVENTIL) (2.5 MG/3ML) 0.083% nebulizer solution Take 3 mLs (2.5 mg total) by nebulization every 6 (six) hours as needed for wheezing or shortness of breath. 150 mL 1  . amLODipine (NORVASC) 5 MG tablet Take 1 tablet (5 mg total) by mouth daily. 30 tablet 4  . budesonide (PULMICORT) 180 MCG/ACT inhaler Inhale 2 puffs into the lungs 2 (two) times daily. 3 Inhaler 2  . butalbital-acetaminophen-caffeine (FIORICET) 50-325-40 MG tablet Take 1-2 tablets by mouth  every 8 (eight) hours as needed for headache. 20 tablet 0  . FLUoxetine (PROZAC) 10 MG capsule Take 10 mg by mouth daily.    . Ledipasvir-Sofosbuvir (HARVONI) 90-400 MG TABS Take by mouth.    Marland Kitchen LORazepam (ATIVAN) 0.5 MG tablet Take 0.5 mg by mouth every 12 (twelve) hours as needed for anxiety.     . mirtazapine (REMERON) 15 MG tablet Take 15 mg by mouth at bedtime.    . nitroGLYCERIN (NITROSTAT) 0.4 MG SL tablet Place 1 tablet (0.4 mg total) under the tongue every 5 (five) minutes as needed for chest pain. 25 tablet 6  . omeprazole (PRILOSEC) 20 MG capsule Take 1 capsule (20 mg total) by mouth daily. 90 capsule 1  . propranolol (INDERAL) 80 MG tablet Take 1 tablet (80 mg total) by mouth 2 (two) times daily. 180 tablet 1  . simvastatin (ZOCOR)  20 MG tablet TAKE 1 TABLET BY MOUTH EVERY NIGHT AT BEDTIME FOR CHOLESTEROL 30 tablet 5  . traZODone (DESYREL) 50 MG tablet Take 25 mg by mouth at bedtime as needed for sleep.    . predniSONE (STERAPRED UNI-PAK 48 TAB) 10 MG (48) TBPK tablet Take by mouth daily. 48 tablet 1   No current facility-administered medications for this visit.    Physical Exam Physical Exam  Constitutional: She is oriented to person, place, and time. She appears well-developed and well-nourished. No distress.  Cardiovascular: Normal rate and intact distal pulses.   Neurological: She is alert and oriented to person, place, and time. She has normal reflexes. She exhibits normal muscle tone. Coordination normal.  Skin: Skin is warm and dry. No rash noted. She is not diaphoretic. No erythema. No pallor.  Psychiatric: She has a normal mood and affect. Her behavior is normal. Judgment and thought content normal.    Right Hip Exam  Right hip exam is normal.   Tenderness  The patient is experiencing no tenderness.     Range of Motion  The patient has normal right hip ROM.  Muscle Strength  The patient has normal right hip strength.   Left Hip Exam  Left hip exam is normal.  Tenderness  The patient is experiencing no tenderness.     Range of Motion  The patient has normal left hip ROM.  Muscle Strength  The patient has normal left hip strength.    Back Exam   Tenderness  The patient is experiencing tenderness in the lumbar.  Range of Motion  Extension: abnormal  Flexion: abnormal   Muscle Strength  Right Quadriceps:  5/5  Left Quadriceps:  5/5  Right Hamstrings:  5/5  Left Hamstrings:  5/5   Reflexes  Patellar: normal Achilles: normal  Other  Gait: antalgic        Neurologic examination  Reflexes were 2+ and equal  Sensation was normal in both feet and legs  Babinski's tests were down going  Straight leg raise testing was normal except for back pain   The vascular  examination revealed normal dorsalis pedis pulses in both feet and both feet were warm with good capillary refill   Data Reviewed I was able to see the hip xrays and they were normal   Assessment  Encounter Diagnosis  Name Primary?  . Midline low back pain with right-sided sciatica Yes      Plan  Steroid dose back   PT

## 2015-11-10 ENCOUNTER — Telehealth: Payer: Self-pay | Admitting: Orthopedic Surgery

## 2015-11-10 NOTE — Telephone Encounter (Signed)
Scott from [place] called in regards to a prescription that Dr. Aline Brochure was suppose to send to the pharmacy.   Please give him a call @ [phone]

## 2015-11-10 NOTE — Telephone Encounter (Signed)
Spoke with patient, medication sent to [facility] and picked up already

## 2015-11-12 ENCOUNTER — Telehealth: Payer: Self-pay | Admitting: Student

## 2015-11-12 ENCOUNTER — Encounter: Payer: Self-pay | Admitting: Physician Assistant

## 2015-11-12 NOTE — Telephone Encounter (Signed)
-----   Message from Soyla Dryer, [place] sent at 11/10/2015  4:26 PM EDT ----- Pt came by office and asked about physical therapy b/c she said orthopedist recommended it.  We should not need to do anything.  Ortho told pt "Call aph therapy dept to schedule therapy visits [phone]"  Please call pt and remind her of his instructions and give her the number again if she needs it. thanks

## 2015-11-12 NOTE — Progress Notes (Signed)
Called pt to remind her to get lab work done for [employer]. Since her appt with them is coming up next week on 11-17-15. Pt stated she was getting ready to go now.

## 2015-11-12 NOTE — Telephone Encounter (Signed)
Called pt and pt stated she had already scheduled an appointment with physical therapy.

## 2015-11-13 LAB — CBC WITH DIFFERENTIAL/PLATELET
BASOS PCT: 0 %
Basophils Absolute: 0 cells/uL (ref 0–200)
EOS ABS: 0 {cells}/uL — AB (ref 15–500)
EOS PCT: 0 %
HCT: 35.6 % (ref 35.0–45.0)
Hemoglobin: 11.2 g/dL — ABNORMAL LOW (ref 11.7–15.5)
Lymphocytes Relative: 18 %
Lymphs Abs: 2574 cells/uL (ref 850–3900)
MCH: 27.7 pg (ref 27.0–33.0)
MCHC: 31.5 g/dL — ABNORMAL LOW (ref 32.0–36.0)
MCV: 88.1 fL (ref 80.0–100.0)
MONOS PCT: 9 %
MPV: 10 fL (ref 7.5–12.5)
Monocytes Absolute: 1287 cells/uL — ABNORMAL HIGH (ref 200–950)
Neutro Abs: 10439 cells/uL — ABNORMAL HIGH (ref 1500–7800)
Neutrophils Relative %: 73 %
PLATELETS: 283 10*3/uL (ref 140–400)
RBC: 4.04 MIL/uL (ref 3.80–5.10)
RDW: 16.2 % — AB (ref 11.0–15.0)
WBC: 14.3 10*3/uL — AB (ref 3.8–10.8)

## 2015-11-13 LAB — COMPREHENSIVE METABOLIC PANEL
ALK PHOS: 57 U/L (ref 33–130)
ALT: 18 U/L (ref 6–29)
AST: 19 U/L (ref 10–35)
Albumin: 4.1 g/dL (ref 3.6–5.1)
BUN: 12 mg/dL (ref 7–25)
CO2: 29 mmol/L (ref 20–31)
CREATININE: 0.75 mg/dL (ref 0.50–0.99)
Calcium: 9.3 mg/dL (ref 8.6–10.4)
Chloride: 103 mmol/L (ref 98–110)
GLUCOSE: 104 mg/dL — AB (ref 65–99)
POTASSIUM: 4 mmol/L (ref 3.5–5.3)
SODIUM: 141 mmol/L (ref 135–146)
Total Bilirubin: 0.2 mg/dL (ref 0.2–1.2)
Total Protein: 7.2 g/dL (ref 6.1–8.1)

## 2015-11-15 LAB — HEPATITIS C RNA QUANTITATIVE: HCV Quantitative: NOT DETECTED IU/mL (ref <15)

## 2015-11-16 ENCOUNTER — Other Ambulatory Visit: Payer: Self-pay | Admitting: Physician Assistant

## 2015-11-16 ENCOUNTER — Ambulatory Visit (HOSPITAL_COMMUNITY): Payer: Self-pay | Admitting: Physical Therapy

## 2015-11-16 MED ORDER — PROPRANOLOL HCL 80 MG PO TABS: 80.0000 mg | ORAL_TABLET | Freq: Two times a day (BID) | ORAL | Status: DC

## 2015-11-17 ENCOUNTER — Encounter: Payer: Self-pay | Admitting: Gastroenterology

## 2015-11-17 ENCOUNTER — Telehealth: Payer: Self-pay | Admitting: Gastroenterology

## 2015-11-17 ENCOUNTER — Ambulatory Visit (INDEPENDENT_AMBULATORY_CARE_PROVIDER_SITE_OTHER): Payer: Self-pay | Admitting: Gastroenterology

## 2015-11-17 VITALS — BP 154/87 | HR 67 | Temp 97.6°F | Ht 64.0 in | Wt 142.2 lb

## 2015-11-17 DIAGNOSIS — B182 Chronic viral hepatitis C: Secondary | ICD-10-CM

## 2015-11-17 NOTE — Progress Notes (Signed)
Primary Care Physician: Soyla Dryer, PA-C  Primary Gastroenterologist:  Barney Drain, MD   Chief Complaint  Patient presents with  . Follow-up    HPI: Cynthia [name] is a 65 y.o. female here for follow-up of hepatitis C treatment. She started Harvoni on 10/14/2015. Her HCV quantitative test was performed 11/12/2015. HCV not detected. Her LFTs were normal. Creatinine is normal. Anemia stable. White blood cell count of 14,000 in setting of prednisone Dose pack (Started by Dr. Aline Brochure).  Patient feeling well. Appetite is great. She has gained some weight. Denies abdominal pain. Bowel movements are regular. Occasionally uses prune juice. No blood in the stool or melena. Heartburn adequately controlled with Prilosec once daily.  Current Outpatient Prescriptions  Medication Sig Dispense Refill  . albuterol (PROVENTIL) (2.5 MG/3ML) 0.083% nebulizer solution Take 3 mLs (2.5 mg total) by nebulization every 6 (six) hours as needed for wheezing or shortness of breath. 150 mL 1  . amLODipine (NORVASC) 5 MG tablet Take 1 tablet (5 mg total) by mouth daily. 30 tablet 4  . budesonide (PULMICORT) 180 MCG/ACT inhaler Inhale 2 puffs into the lungs 2 (two) times daily. 3 Inhaler 2  . butalbital-acetaminophen-caffeine (FIORICET) 50-325-40 MG tablet Take 1-2 tablets by mouth every 8 (eight) hours as needed for headache. 20 tablet 0  . FLUoxetine (PROZAC) 10 MG capsule Take 10 mg by mouth daily.    . Ledipasvir-Sofosbuvir (HARVONI) 90-400 MG TABS Take by mouth.    Marland Kitchen LORazepam (ATIVAN) 0.5 MG tablet Take 0.5 mg by mouth every 12 (twelve) hours as needed for anxiety.     . mirtazapine (REMERON) 15 MG tablet Take 15 mg by mouth at bedtime.    . nitroGLYCERIN (NITROSTAT) 0.4 MG SL tablet Place 1 tablet (0.4 mg total) under the tongue every 5 (five) minutes as needed for chest pain. 25 tablet 6  . omeprazole (PRILOSEC) 20 MG capsule Take 1 capsule (20 mg total) by mouth daily. 90 capsule 1  .  predniSONE (STERAPRED UNI-PAK 48 TAB) 10 MG (48) TBPK tablet Take by mouth daily. 48 tablet 1  . propranolol (INDERAL) 80 MG tablet Take 1 tablet (80 mg total) by mouth 2 (two) times daily. 180 tablet 3  . simvastatin (ZOCOR) 20 MG tablet TAKE 1 TABLET BY MOUTH EVERY NIGHT AT BEDTIME FOR CHOLESTEROL 30 tablet 5  . traZODone (DESYREL) 50 MG tablet Take 25 mg by mouth at bedtime as needed for sleep.     No current facility-administered medications for this visit.    Allergies as of 11/17/2015 - Review Complete 11/17/2015  Allergen Reaction Noted  . Aspirin  11/11/2008    ROS:  General: Negative for anorexia, weight loss, fever, chills, fatigue, weakness. ENT: Negative for hoarseness, difficulty swallowing , nasal congestion. CV: Negative for chest pain, angina, palpitations, dyspnea on exertion, peripheral edema.  Respiratory: Negative for dyspnea at rest, dyspnea on exertion, cough, sputum, wheezing.  GI: See history of present illness. GU:  Negative for dysuria, hematuria, urinary incontinence, urinary frequency, nocturnal urination.  Endo: Negative for unusual weight change.    Physical Examination:   BP 154/87 mmHg  Pulse 67  Temp(Src) 97.6 F (36.4 C)  Ht '5\' 4"'$  (1.626 m)  Wt 142 lb 3.2 oz (64.501 kg)  BMI 24.40 kg/m2  General: Well-nourished, well-developed in no acute distress.  Eyes: No icterus. Mouth: Oropharyngeal mucosa moist and pink , no lesions erythema or exudate. Lungs: Clear to auscultation bilaterally.  Heart: Regular rate and rhythm,  no murmurs rubs or gallops.  Abdomen: Bowel sounds are normal, nontender, nondistended, no hepatosplenomegaly or masses, no abdominal bruits or hernia , no rebound or guarding.   Extremities: No lower extremity edema. No clubbing or deformities. Neuro: Alert and oriented x 4   Skin: Warm and dry, no jaundice.   Psych: Alert and cooperative, normal mood and affect.  Labs:  Lab Results  Component Value Date   CREATININE 0.75  11/12/2015   BUN 12 11/12/2015   NA 141 11/12/2015   K 4.0 11/12/2015   CL 103 11/12/2015   CO2 29 11/12/2015   Lab Results  Component Value Date   ALT 18 11/12/2015   AST 19 11/12/2015   ALKPHOS 57 11/12/2015   BILITOT 0.2 11/12/2015   Lab Results  Component Value Date   WBC 14.3* 11/12/2015   HGB 11.2* 11/12/2015   HCT 35.6 11/12/2015   MCV 88.1 11/12/2015   PLT 283 11/12/2015   Lab Results  Component Value Date   IRON 37* 02/10/2015   TIBC 221* 02/10/2015   FERRITIN 276 02/10/2015    Imaging Studies: No results found.

## 2015-11-17 NOTE — Telephone Encounter (Signed)
A speciality pharmacy called to make arrangements to deliver medication, It will arrive on Monday 11/22/2015. The lady that called also wanted to know if anything with the patient's medications have changed, etc. I told her [facility] nurse wasn't here today and the other nurse was at lunch. She is to call back in 30 minutes to speak with a nurse.

## 2015-11-17 NOTE — Assessment & Plan Note (Signed)
Has completed one month of HCV treatment. HCV RNA quantitative is negative. LFTs are normal. She has been very compliant with her medications. No issues with side effects. Repeat labs at end of treatment, after June 29.  She also has history of H. pylori, needs to have testing for eradication. We will plan on completing this after her hepatitis C has been completely treated. She'll come back to be seen in 3 months.

## 2015-11-17 NOTE — Progress Notes (Signed)
Quick Note:  Cynthia [name],   Patient see in the office today. HCV quantitative NEGATIVE after 4 weeks of Harvoni. Next labs at end of treatment, 01/06/16 or after to include HCV RNA quantitative, CMET, CBC. ______

## 2015-11-17 NOTE — Progress Notes (Signed)
cc'ed to pcp °

## 2015-11-17 NOTE — Telephone Encounter (Signed)
I spoke with Cynthia [name] at [place] pt assistance and answered all her questions. Pt was seen in the office today.  She is aware medication will be delivered on Monday.

## 2015-11-17 NOTE — Patient Instructions (Addendum)
1. Continue Harvoni once daily. Continue to keep me informed of any new medication changes.  2. We will check your labs again once you complete 12 weeks of Harvoni. You should be done with therapy around 01/06/16. 3. I will see you back in 3 months, once you are done with Harvoni, we will need to check to make sure H.pylori (stomach infection) was cured, this will be done with a special stool test but I want to wait until you are done with Harvoni treatment first.

## 2015-11-18 NOTE — Telephone Encounter (Signed)
Noted  

## 2015-11-30 ENCOUNTER — Ambulatory Visit (HOSPITAL_COMMUNITY): Payer: Self-pay | Attending: Orthopedic Surgery | Admitting: Physical Therapy

## 2015-11-30 DIAGNOSIS — M25552 Pain in left hip: Secondary | ICD-10-CM | POA: Insufficient documentation

## 2015-11-30 DIAGNOSIS — M25551 Pain in right hip: Secondary | ICD-10-CM | POA: Insufficient documentation

## 2015-11-30 DIAGNOSIS — R262 Difficulty in walking, not elsewhere classified: Secondary | ICD-10-CM | POA: Insufficient documentation

## 2015-11-30 DIAGNOSIS — M6281 Muscle weakness (generalized): Secondary | ICD-10-CM | POA: Insufficient documentation

## 2015-11-30 NOTE — Patient Instructions (Signed)
Knee-to-Chest Stretch: Unilateral    With hand behind right knee, pull knee in to chest until a comfortable stretch is felt in lower back and buttocks. Keep back relaxed. Hold 30___ seconds. Repeat _3___ times per set. Do 1____ sets per session. Do2 ____ sessions per day.  http://orth.exer.us/126   Copyright  VHI. All rights reserved.  Isometric Abdominal    Lying on back with knees bent, tighten stomach by pressing elbows down. Now squeeze your buttock together as well  Hold _3___ seconds. Repeat __10__ times per set. Do __2__ sets per session. Do __2__ sessions per day.  http://orth.exer.us/1086   Copyright  VHI. All rights reserved.  Bridging    Slowly raise buttocks from floor, keeping stomach tight. Repeat _10___ times per set. Do _1___ sets per session. Do __2__ sessions per day.  http://orth.exer.us/1096   Copyright  VHI. All rights reserved.  Bent Leg Lift (Hook-Lying)    Tighten stomach and slowly raise right leg ___2_ inches from floor. Keep trunk rigid. Hold __3__ seconds. Repeat _10___ times per set. Do ___1_ sets per session. Do __2__ sessions per day.  http://orth.exer.us/1090   Copyright  VHI. All rights reserved.  Hip Abduction / Adduction: with Knee Flexion (Supine)    With right knee bent, gently lower knee to side and return. Repeat _10___ times per set. Do _1___ sets per session. Do _2___ sessions per day.  http://orth.exer.us/682   Copyright  VHI. All rights reserved.

## 2015-11-30 NOTE — Therapy (Signed)
[facility] [address] [place], [place], [address] Phone: [phone]   Fax:  [phone]  Physical Therapy Evaluation  Patient Details  Name: Cynthia [name] MRN: [id number] Date of Birth: 19-Sep-1950 Referring Provider: Dr. Arther Abbott  Encounter Date: 11/30/2015      PT End of Session - 11/30/15 1202    Visit Number 1   Number of Visits 4   Date for PT Re-Evaluation 12/30/15   Authorization Type no insurance   PT Start Time 1120   PT Stop Time 1200   PT Time Calculation (min) 40 min   Activity Tolerance Patient tolerated treatment well   Behavior During Therapy [facility] for tasks assessed/performed      Past Medical History  Diagnosis Date  . Migraine   . Hypertension   . Bronchitis   . High blood cholesterol level   . PTSD (post-traumatic stress disorder)   . COPD (chronic obstructive pulmonary disease) ([place])   . Pneumonia   . CHF (congestive heart failure) ([place])   . GERD (gastroesophageal reflux disease)   . Angina pectoris ([place])   . Arthritis   . Bronchitis   . Migraines   . Anxiety   . Depression   . HCV (hepatitis C virus)     Past Surgical History  Procedure Laterality Date  . Left arm      ? nerve repaired, " it was pinched".  . Tonsillectomy    . Colonoscopy with propofol N/A 02/09/2015    SLF: 1. the examined terminal ileum appeared to be normal 2. the colonic mucosa appeared normal 3. small internal hemorrhoids  . Esophagogastroduodenoscopy (egd) with propofol N/A 02/09/2015    SLF: 1. stricture at the gastroesophageal junction 2. mild non-erosive gastritis   . Savory dilation N/A 02/09/2015    Procedure: SAVORY DILATION 12.77m, 184m 1552m61m22mSurgeon: SandDanie Binder;  Location: [facility];  Service: Endoscopy;  Laterality: N/A;  . Biopsy  02/09/2015    Procedure: GASTRIC BIOPSIES ;  Surgeon: SandDanie Binder;  Location: [facility];  Service: Endoscopy;;  . Left elbow surgery Left     There were no  vitals filed for this visit.       Subjective Assessment - 11/30/15 1122    Subjective [name] that she has been having pain in her groin and buttock area in both her Right and left side.  The pain increases with walking.   The pain started a couple of months ago after a fall down the steps of her trailer    Pertinent History Hepatitis C; HTN, heart murmer.    How long can you sit comfortably? Pt is able to sit for 20-30 minutes and then her hips begin to bother her.    How long can you stand comfortably? Able to stand for 30 minutes    How long can you walk comfortably? Pt has increased pain afte walking for five minutes    Currently in Pain? Yes   Pain Score 7    Pain Location Hip   Pain Orientation Left  sometime in the right but not at this time   Pain Descriptors / Indicators Aching;Sharp   Pain Type Chronic pain   Pain Radiating Towards inguinal and buttock    Pain Onset More than a month ago   Pain Frequency Constant   Aggravating Factors  walking    Pain Relieving Factors pain medication  [facility] PT Assessment - 11/30/15 0001    Assessment   Medical Diagnosis lumbar radiculopathy   Referring Provider Dr. Arther Abbott   Onset Date/Surgical Date 09/22/15   Next MD Visit after therapy   Prior Therapy none   Precautions   Precautions None   Restrictions   Weight Bearing Restrictions No   Balance Screen   Has the patient fallen in the past 6 months Yes   How many times? 3   Has the patient had a decrease in activity level because of a fear of falling?  Yes   Is the patient reluctant to leave their home because of a fear of falling?  No   Home Ecologist residence   Prior Function   Level of Independence Independent   Leisure watch movies   Cognition   Overall Cognitive Status Within Functional Limits for tasks assessed   Observation/Other Assessments   Focus on Therapeutic Outcomes (FOTO)  45    Posture/Postural Control   Posture/Postural Control Postural limitations   Posture Comments Rt shoulder, iliac crest, Rt PSIS high: RT ASIS low    ROM / Strength   AROM / PROM / Strength Strength   Strength   Strength Assessment Site Ankle;Knee;Hip   Right/Left Hip Right;Left   Right Hip Flexion 4-/5   Right Hip Extension 3-/5   Left Hip Flexion 4-/5   Left Hip Extension 3-/5   Right/Left Knee Right;Left   Right Knee Extension 4/5   Left Knee Extension 4/5   Right/Left Ankle Right;Left   Right Ankle Dorsiflexion 5/5   Left Ankle Dorsiflexion 5/5                   [facility] Adult PT Treatment/Exercise - 11/30/15 0001    Exercises   Exercises Lumbar   Lumbar Exercises: Stretches   Single Knee to Chest Stretch 2 reps;20 seconds   Lumbar Exercises: Supine   Ab Set 10 reps  with glut set    Clam 5 reps   Bent Knee Raise 5 reps   Bridge 5 reps   Manual Therapy   Manual Therapy Muscle Energy Technique   Manual therapy comments for correction of SI dysfunction                 PT Education - 11/30/15 1201    Education provided Yes   Education Details HEP for lumbar stabilization    Person(s) Educated Patient   Methods Explanation   Comprehension Verbalized understanding          PT Short Term Goals - 11/30/15 1207    PT SHORT TERM GOAL #1   Title Pt core strength one half grade to allow pt to be able to roll in bed without inceased pain    Time 2   Period Weeks   Status New   PT SHORT TERM GOAL #2   Title Pt Bilateral back and hip  pain to be no greater than a 4/10 to allow patient to be able to walk for 15 minutes.   Time 2   Period Weeks   Status New   PT SHORT TERM GOAL #3   Title Pt to be able to sit for 45 minutes without having increased pain   Time 2   Period Weeks   Status New   PT SHORT TERM GOAL #4   Title Pt B  LE strength to be increased 1/2 grade to allow pt to come sit to stand  without difficulty.    Time 2   Period Weeks    Status New           PT Long Term Goals - 11/30/15 1216    PT LONG TERM GOAL #1   Title Pt core strength and  bilatereal LE strength to be increased one grade to allow her to ascend and descend 5 steps without increased back or hip pain    Time 4   Period Weeks   Status New   PT LONG TERM GOAL #2   Title Pt back and bilateral hip pain pain to be no greater than a 2/10 to allow pt to be able to complete her housework without having to take breaks.    Time 4   Period Weeks   PT LONG TERM GOAL #3   Title Pt to be able to walk for 20 mintues at a time to allow shopping for groceries and going to comminity events easier    Time 4   Period Weeks   Status New   PT LONG TERM GOAL #4   Title Pt to be independent in an advance HEP to allow pt to be ready for discharge.    Time 4   Period Weeks   Status New               Plan - 11/30/15 1202    Clinical Impression Statement Pt is a 65 yo female who has had radicular symptoms in both LE ever since she fell down some steps.  Ms. [name] has been referred to skiilled physical therapy.  Evaluation demonstrates decrased core strength, decreased activity tolerance, postrual  dysfunction and poor body mechanics.  Ms. [name] wll benefit from skilled therapy to address these issues and improve her pain as well as her functioning level    Rehab Potential Good   PT Frequency 1x / week   PT Duration 4 weeks   PT Treatment/Interventions ADLs/Self Care Home Management;Patient/family education;Therapeutic activities;Therapeutic exercise;Functional mobility training;Manual techniques   PT Next Visit Plan check SI, begin heel raises, functional squats, sit to stand and prone exercises progress to lunges, t-band and wall squats.  Pt has no insurance so attempt to cover as much information in one session as possible.    Consulted and Agree with Plan of Care Patient      Patient will benefit from skilled therapeutic intervention in order to improve  the following deficits and impairments:  Decreased activity tolerance, Decreased strength, Difficulty walking, Postural dysfunction, Pain  Visit Diagnosis: Pain in right hip  Pain in left hip  Difficulty in walking, not elsewhere classified  Muscle weakness (generalized)     Problem List Patient Active Problem List   Diagnosis Date Noted  . GERD (gastroesophageal reflux disease) 10/13/2015  . Gastritis 08/02/2015  . Chronic obstructive pulmonary disease ([place]) 07/14/2015  . Obstructive chronic bronchitis without exacerbation ([place]) 04/13/2015  . Headache, tension type, chronic 04/13/2015  . Anxiety state 02/27/2015  . Dysphagia, pharyngoesophageal phase 01/15/2015  . Loss of weight 01/15/2015  . FH: colon cancer 01/15/2015  . Hepatitis C, chronic ([place]) 01/15/2015  . Anemia 01/15/2015  . Acute respiratory failure with hypoxia ([place]) 01/08/2013  . CAP (community acquired pneumonia) 01/08/2013  . Leukocytosis, unspecified 01/08/2013  . Sinus tachycardia ([place]) 01/08/2013  . Hyperlipidemia 11/26/2008  . UNSPECIFIED ANEMIA 11/26/2008  . TOBACCO USER 11/11/2008  . MIGRAINE HEADACHE 11/11/2008  . Essential hypertension, benign 11/11/2008  . ALLERGIC RHINITIS, SEASONAL 11/11/2008  . BRONCHITIS, CHRONIC  11/11/2008  . ARTHRITIS 11/11/2008  . Lesion of ulnar nerve 12/04/2007  . CARPAL TUNNEL SYNDROME 10/08/2007  . TRIGGER FINGER 06/27/2007    Rayetta Humphrey, PT CLT [phone] 11/30/2015, 12:22 PM  [facility] [address] [place], [place], [address] Phone: [phone]   Fax:  [phone]  Name: NURIYA STUCK MRN: [id number] Date of Birth: 1951-06-14

## 2015-12-07 ENCOUNTER — Ambulatory Visit (HOSPITAL_COMMUNITY): Payer: Self-pay | Admitting: Physical Therapy

## 2015-12-08 ENCOUNTER — Telehealth (HOSPITAL_COMMUNITY): Payer: Self-pay

## 2015-12-08 ENCOUNTER — Ambulatory Visit (HOSPITAL_COMMUNITY): Payer: Self-pay

## 2015-12-08 ENCOUNTER — Encounter (HOSPITAL_COMMUNITY): Payer: Self-pay

## 2015-12-08 ENCOUNTER — Emergency Department (HOSPITAL_COMMUNITY): Payer: Self-pay

## 2015-12-08 ENCOUNTER — Emergency Department (HOSPITAL_COMMUNITY)
Admission: EM | Admit: 2015-12-08 | Discharge: 2015-12-08 | Disposition: A | Discharge status: Home / Self Care | Payer: Self-pay | Attending: Emergency Medicine | Admitting: Emergency Medicine

## 2015-12-08 ENCOUNTER — Other Ambulatory Visit: Payer: Self-pay | Admitting: Physician Assistant

## 2015-12-08 DIAGNOSIS — I110 Hypertensive heart disease with heart failure: Secondary | ICD-10-CM | POA: Insufficient documentation

## 2015-12-08 DIAGNOSIS — Y939 Activity, unspecified: Secondary | ICD-10-CM | POA: Insufficient documentation

## 2015-12-08 DIAGNOSIS — J449 Chronic obstructive pulmonary disease, unspecified: Secondary | ICD-10-CM | POA: Insufficient documentation

## 2015-12-08 DIAGNOSIS — M1990 Unspecified osteoarthritis, unspecified site: Secondary | ICD-10-CM | POA: Insufficient documentation

## 2015-12-08 DIAGNOSIS — I509 Heart failure, unspecified: Secondary | ICD-10-CM | POA: Insufficient documentation

## 2015-12-08 DIAGNOSIS — Z79899 Other long term (current) drug therapy: Secondary | ICD-10-CM | POA: Insufficient documentation

## 2015-12-08 DIAGNOSIS — F329 Major depressive disorder, single episode, unspecified: Secondary | ICD-10-CM | POA: Insufficient documentation

## 2015-12-08 DIAGNOSIS — W109XXA Fall (on) (from) unspecified stairs and steps, initial encounter: Secondary | ICD-10-CM | POA: Insufficient documentation

## 2015-12-08 DIAGNOSIS — Y929 Unspecified place or not applicable: Secondary | ICD-10-CM | POA: Insufficient documentation

## 2015-12-08 DIAGNOSIS — R51 Headache: Secondary | ICD-10-CM | POA: Insufficient documentation

## 2015-12-08 DIAGNOSIS — S93402A Sprain of unspecified ligament of left ankle, initial encounter: Secondary | ICD-10-CM | POA: Insufficient documentation

## 2015-12-08 DIAGNOSIS — F17210 Nicotine dependence, cigarettes, uncomplicated: Secondary | ICD-10-CM | POA: Insufficient documentation

## 2015-12-08 DIAGNOSIS — Y999 Unspecified external cause status: Secondary | ICD-10-CM | POA: Insufficient documentation

## 2015-12-08 MED ORDER — HYDROCODONE-ACETAMINOPHEN 5-325 MG PO TABS: 1.0000 | ORAL_TABLET | ORAL | Status: DC | PRN

## 2015-12-08 NOTE — ED Provider Notes (Signed)
CSN: [id number]     Arrival date & time 12/08/15  1453 History   First MD Initiated Contact with Patient 12/08/15 1533     Chief Complaint  Patient presents with  . Fall     (Consider location/radiation/quality/duration/timing/severity/associated sxs/prior Treatment) Patient is a 65 y.o. female presenting with fall. The history is provided by the patient.  Fall This is a new problem. The current episode started yesterday. The problem has been gradually worsening. Associated symptoms include arthralgias and headaches. Pertinent negatives include no numbness. Associated symptoms comments: Left ankle pain. The symptoms are aggravated by walking and standing. She has tried acetaminophen and rest for the symptoms. The treatment provided no relief.    Past Medical History  Diagnosis Date  . Migraine   . Hypertension   . Bronchitis   . High blood cholesterol level   . PTSD (post-traumatic stress disorder)   . COPD (chronic obstructive pulmonary disease) ([place])   . Pneumonia   . CHF (congestive heart failure) ([place])   . GERD (gastroesophageal reflux disease)   . Angina pectoris ([place])   . Arthritis   . Bronchitis   . Migraines   . Anxiety   . Depression   . HCV (hepatitis C virus)    Past Surgical History  Procedure Laterality Date  . Left arm      ? nerve repaired, " it was pinched".  . Tonsillectomy    . Colonoscopy with propofol N/A 02/09/2015    SLF: 1. the examined terminal ileum appeared to be normal 2. the colonic mucosa appeared normal 3. small internal hemorrhoids  . Esophagogastroduodenoscopy (egd) with propofol N/A 02/09/2015    SLF: 1. stricture at the gastroesophageal junction 2. mild non-erosive gastritis   . Savory dilation N/A 02/09/2015    Procedure: SAVORY DILATION 12.68m, 14m 1541m61m37mSurgeon: SandDanie Binder;  Location[facility];  Service: Endoscopy;  Laterality: N/A;  . Biopsy  02/09/2015    Procedure: GASTRIC BIOPSIES ;  Surgeon: SandDanie Binder;  Location:  [facility];  Service: Endoscopy;;  . Left elbow surgery Left    Family History  Problem Relation Age of Onset  . Heart failure Father   . Hypertension Father   . Stroke Father   . Colon cancer Father     greater than age 75  56Cancer Father   . Cancer Mother   . Seizures Sister    Social History  Substance Use Topics  . Smoking status: Current Every Day Smoker -- 0.25 packs/day for 42 years    Types: Cigarettes    Start date: 09/29/1972  . Smokeless tobacco: Never Used     Comment: about 2 cigs per day  . Alcohol Use: No   OB History    Gravida Para Term Preterm AB TAB SAB Ectopic Multiple Living   '2 1   1          '$ Review of Systems  Musculoskeletal: Positive for arthralgias.  Neurological: Positive for headaches. Negative for numbness.  Psychiatric/Behavioral: The patient is nervous/anxious.   All other systems reviewed and are negative.     Allergies  Aspirin  Home Medications   Prior to Admission medications   Medication Sig Start Date End Date Taking? Authorizing Provider  albuterol (PROVENTIL) (2.5 MG/3ML) 0.083% nebulizer solution Take 3 mLs (2.5 mg total) by nebulization every 6 (six) hours as needed for wheezing or shortness of breath. 08/02/15   ShanSoyla Dryer-C  amLODipine (NORVASC) 5 MG  tablet Take 1 tablet (5 mg total) by mouth daily. 09/28/15   Soyla Dryer, PA-C  budesonide (PULMICORT) 180 MCG/ACT inhaler Inhale 2 puffs into the lungs 2 (two) times daily. 08/02/15   Soyla Dryer, PA-C  butalbital-acetaminophen-caffeine (FIORICET) 50-325-40 MG tablet Take 1-2 tablets by mouth every 8 (eight) hours as needed for headache. 11/08/15 11/07/16  Soyla Dryer, PA-C  FLUoxetine (PROZAC) 10 MG capsule Take 10 mg by mouth daily.    Historical Provider, MD  Ledipasvir-Sofosbuvir (HARVONI) 90-400 MG TABS Take by mouth.    Historical Provider, MD  LORazepam (ATIVAN) 0.5 MG tablet Take 0.5 mg by mouth every 12 (twelve) hours as needed for anxiety.     Historical  Provider, MD  mirtazapine (REMERON) 15 MG tablet Take 15 mg by mouth at bedtime.    Historical Provider, MD  nitroGLYCERIN (NITROSTAT) 0.4 MG SL tablet Place 1 tablet (0.4 mg total) under the tongue every 5 (five) minutes as needed for chest pain. 08/23/15   Soyla Dryer, PA-C  omeprazole (PRILOSEC) 20 MG capsule Take 1 capsule (20 mg total) by mouth daily. 08/02/15   Soyla Dryer, PA-C  predniSONE (STERAPRED UNI-PAK 48 TAB) 10 MG (48) TBPK tablet Take by mouth daily. 11/09/15   Carole Civil, MD  propranolol (INDERAL) 80 MG tablet Take 1 tablet (80 mg total) by mouth 2 (two) times daily. 11/16/15   Soyla Dryer, PA-C  simvastatin (ZOCOR) 20 MG tablet TAKE 1 TABLET BY MOUTH EVERY NIGHT AT BEDTIME FOR CHOLESTEROL 10/07/15   Soyla Dryer, PA-C  traZODone (DESYREL) 50 MG tablet Take 25 mg by mouth at bedtime as needed for sleep.    Historical Provider, MD   BP 141/85 mmHg  Pulse 89  Temp(Src) 97.6 F (36.4 C) (Temporal)  Resp 20  Ht '5\' 4"'$  (1.626 m)  Wt 65.772 kg  BMI 24.88 kg/m2  SpO2 96% Physical Exam  Constitutional: She is oriented to person, place, and time. She appears well-developed and well-nourished.  Non-toxic appearance.  HENT:  Head: Normocephalic.  Right Ear: Tympanic membrane and external ear normal.  Left Ear: Tympanic membrane and external ear normal.  Eyes: EOM and lids are normal. Pupils are equal, round, and reactive to light.  Neck: Normal range of motion. Neck supple. Carotid bruit is not present.  Cardiovascular: Normal rate, regular rhythm, normal heart sounds, intact distal pulses and normal pulses.   Pulmonary/Chest: Breath sounds normal. No respiratory distress.  Abdominal: Soft. Bowel sounds are normal. There is no tenderness. There is no guarding.  Musculoskeletal:       Left knee: Normal.       Left ankle: She exhibits decreased range of motion. Tenderness. Lateral malleolus tenderness found.  Lymphadenopathy:       Head (right side): No  submandibular adenopathy present.       Head (left side): No submandibular adenopathy present.    She has no cervical adenopathy.  Neurological: She is alert and oriented to person, place, and time. She has normal strength. No cranial nerve deficit or sensory deficit.  Skin: Skin is warm and dry.  Psychiatric: She has a normal mood and affect. Her speech is normal.  Nursing note and vitals reviewed.   ED Course  Procedures (including critical care time) Labs Review Labs Reviewed - No data to display  Imaging Review Dg Ankle Complete Left  12/08/2015  CLINICAL DATA:  Fall down steps with ankle pain, initial encounter EXAM: LEFT ANKLE COMPLETE - 3+ VIEW COMPARISON:  None. FINDINGS: Considerable soft  tissue swelling is noted particularly laterally. No acute fracture or dislocation is noted. No other focal abnormality is seen. IMPRESSION: Soft tissue swelling without acute bony abnormality. Electronically Signed   By: Inez Catalina M.D.   On: 12/08/2015 15:16   I have personally reviewed and evaluated these images and lab results as part of my medical decision-making.   EKG Interpretation None      MDM  X-ray of the left ankle is negative for fracture or dislocation. The examination favors left ankle sprain. Patient was fitted with an ankle stirrup splint. Crutches were offered to the patient, however she refuses them at this time. Ice pack also provided. Patient will use Tylenol for mild pain, Norco for more severe pain. Patient in agreement with this discharge plan.    Final diagnoses:  None    **I have reviewed nursing notes, vital signs, and all appropriate lab and imaging results for this patient.Lily Kocher, PA-C 12/08/15 [place], MD 12/08/15 [phone]

## 2015-12-08 NOTE — Telephone Encounter (Signed)
She fell and hurt her foot (Left) will come in on 12/15/15

## 2015-12-08 NOTE — ED Notes (Signed)
Gave patient ice pack.

## 2015-12-08 NOTE — ED Notes (Signed)
Pt reports tripped going down the last step yesterday.  C/O pain in left ankle.

## 2015-12-08 NOTE — Discharge Instructions (Signed)
Please keep your foot elevated above your waist. Apply ice. Please use Tylenol every 4 hours for mild pain, use Norco every 4-6 hours as needed for more severe pain.This medication may cause drowsiness. Please do not drink, drive, or participate in activity that requires concentration while taking this medication. Ankle Sprain An ankle sprain is an injury to the strong, fibrous tissues (ligaments) that hold your ankle bones together.  HOME CARE   Put ice on your ankle for 1-2 days or as told by your doctor.  Put ice in a plastic bag.  Place a towel between your skin and the bag.  Leave the ice on for 15-20 minutes at a time, every 2 hours while you are awake.  Only take medicine as told by your doctor.  Raise (elevate) your injured ankle above the level of your heart as much as possible for 2-3 days.  Use crutches if your doctor tells you to. Slowly put your own weight on the affected ankle. Use the crutches until you can walk without pain.  If you have a plaster splint:  Do not rest it on anything harder than a pillow for 24 hours.  Do not put weight on it.  Do not get it wet.  Take it off to shower or bathe.  If given, use an elastic wrap or support stocking for support. Take the wrap off if your toes lose feeling (numb), tingle, or turn cold or blue.  If you have an air splint:  Add or let out air to make it comfortable.  Take it off at night and to shower and bathe.  Wiggle your toes and move your ankle up and down often while you are wearing it. GET HELP IF:  You have rapidly increasing bruising or puffiness (swelling).  Your toes feel very cold.  You lose feeling in your foot.  Your medicine does not help your pain. GET HELP RIGHT AWAY IF:   Your toes lose feeling (numb) or turn blue.  You have severe pain that is increasing. MAKE SURE YOU:   Understand these instructions.  Will watch your condition.  Will get help right away if you are not doing well  or get worse.   This information is not intended to replace advice given to you by your health care provider. Make sure you discuss any questions you have with your health care provider.   Document Released: 12/13/2007 Document Revised: 07/17/2014 Document Reviewed: 01/08/2012 Elsevier Interactive Patient Education [employer].

## 2015-12-09 ENCOUNTER — Other Ambulatory Visit: Payer: Self-pay | Admitting: Physician Assistant

## 2015-12-13 ENCOUNTER — Other Ambulatory Visit: Payer: Self-pay | Admitting: Physician Assistant

## 2015-12-13 MED ORDER — MOMETASONE FUROATE 100 MCG/ACT IN AERO: 1.0000 | INHALATION_SPRAY | Freq: Two times a day (BID) | RESPIRATORY_TRACT | Status: DC

## 2015-12-15 ENCOUNTER — Ambulatory Visit (HOSPITAL_COMMUNITY): Payer: Self-pay | Attending: Orthopedic Surgery | Admitting: Physical Therapy

## 2015-12-15 DIAGNOSIS — R262 Difficulty in walking, not elsewhere classified: Secondary | ICD-10-CM | POA: Insufficient documentation

## 2015-12-15 DIAGNOSIS — M6281 Muscle weakness (generalized): Secondary | ICD-10-CM | POA: Insufficient documentation

## 2015-12-15 DIAGNOSIS — M25552 Pain in left hip: Secondary | ICD-10-CM | POA: Insufficient documentation

## 2015-12-15 DIAGNOSIS — M25551 Pain in right hip: Secondary | ICD-10-CM | POA: Insufficient documentation

## 2015-12-15 NOTE — Therapy (Signed)
[place], [place], [address] Phone: [phone]   Fax:  [phone]  Physical Therapy Treatment  Patient Details  Name: Cynthia [name] MRN: [id number] Date of Birth: 27-Oct-1950 Referring Provider: Dr. Arther Abbott  Encounter Date: 12/15/2015      PT End of Session - 12/15/15 1156    Visit Number 2   Number of Visits 4   Date for PT Re-Evaluation 12/30/15   Authorization Type no insurance   PT Start Time 1120   PT Stop Time 1205   PT Time Calculation (min) 45 min   Activity Tolerance Patient tolerated treatment well   Behavior During Therapy [facility] for tasks assessed/performed      Past Medical History  Diagnosis Date  . Migraine   . Hypertension   . Bronchitis   . High blood cholesterol level   . PTSD (post-traumatic stress disorder)   . COPD (chronic obstructive pulmonary disease) ([place])   . Pneumonia   . CHF (congestive heart failure) ([place])   . GERD (gastroesophageal reflux disease)   . Angina pectoris ([place])   . Arthritis   . Bronchitis   . Migraines   . Anxiety   . Depression   . HCV (hepatitis C virus)     Past Surgical History  Procedure Laterality Date  . Left arm      ? nerve repaired, " it was pinched".  . Tonsillectomy    . Colonoscopy with propofol N/A 02/09/2015    SLF: 1. the examined terminal ileum appeared to be normal 2. the colonic mucosa appeared normal 3. small internal hemorrhoids  . Esophagogastroduodenoscopy (egd) with propofol N/A 02/09/2015    SLF: 1. stricture at the gastroesophageal junction 2. mild non-erosive gastritis   . Savory dilation N/A 02/09/2015    Procedure: SAVORY DILATION 12.46m, 132m [id number]: SandDanie Binder;  Location: [facility];  Service: Endoscopy;  Laterality: N/A;  . Biopsy  02/09/2015    Procedure: GASTRIC BIOPSIES ;  Surgeon: SandDanie Binder;  Location: [facility];  Service: Endoscopy;;  . Left elbow surgery Left     There were no  vitals filed for this visit.      Subjective Assessment - 12/15/15 1130    Subjective Pt states she fell again after her last appointment here on the 30th and twisted her Lt ankle.  Pt comes today with noted antalgic gait and pain in her ankle with weight bearing.  Pt states she has not been completing her HEP and she is currently having 6/10 pain in her Lt lumbar region.  Pt is having 8/10 pain in her Lt ankle with noted swelling in both ankles.                          [facility] Treatment/Exercise - 12/15/15 0001    Lumbar Exercises: Stretches   Single Knee to Chest Stretch 2 reps;20 seconds   Prone on Elbows Stretch 60 seconds   Lumbar Exercises: Seated   Sit to Stand 10 reps   Sit to Stand Limitations no UE's   Lumbar Exercises: Supine   Ab Set 10 reps   Bent Knee Raise 5 reps   Bridge 10 reps   Lumbar Exercises: Sidelying   Clam 10 reps   Lumbar Exercises: Prone   Straight Leg Raise 10 reps   Manual Therapy   Manual Therapy Muscle Energy Technique  Muscle Energy Technique Not needed this session as in good alignment                PT Education - 12/15/15 1156    Education provided Yes   Education Details reviewed HEP and given copy of intial evaluation/review of goals this session.  Updated HEP with prone hip extension, POE and sit to stands   Person(s) Educated Patient   Methods Explanation;Demonstration;Tactile cues;Verbal cues;Handout   Comprehension Verbalized understanding;Returned demonstration;Verbal cues required;Tactile cues required          PT Short Term Goals - 11/30/15 1207    PT SHORT TERM GOAL #1   Title Pt core strength one half grade to allow pt to be able to roll in bed without inceased pain    Time 2   Period Weeks   Status New   PT SHORT TERM GOAL #2   Title Pt Bilateral back and hip  pain to be no greater than a 4/10 to allow patient to be able to walk for 15 minutes.   Time 2   Period Weeks   Status New   PT  SHORT TERM GOAL #3   Title Pt to be able to sit for 45 minutes without having increased pain   Time 2   Period Weeks   Status New   PT SHORT TERM GOAL #4   Title Pt B  LE strength to be increased 1/2 grade to allow pt to come sit to stand without difficulty.    Time 2   Period Weeks   Status New           PT Long Term Goals - 11/30/15 1216    PT LONG TERM GOAL #1   Title Pt core strength and  bilatereal LE strength to be increased one grade to allow her to ascend and descend 5 steps without increased back or hip pain    Time 4   Period Weeks   Status New   PT LONG TERM GOAL #2   Title Pt back and bilateral hip pain pain to be no greater than a 2/10 to allow pt to be able to complete her housework without having to take breaks.    Time 4   Period Weeks   PT LONG TERM GOAL #3   Title Pt to be able to walk for 20 mintues at a time to allow shopping for groceries and going to comminity events easier    Time 4   Period Weeks   Status New   PT LONG TERM GOAL #4   Title Pt to be independent in an advance HEP to allow pt to be ready for discharge.    Time 4   Period Weeks   Status New               Plan - 12/15/15 1200    Clinical Impression Statement Pt returns today after missing 2 weeks due to another fall.  This time, she twisted her Lt ankle when she got to the bottom step. Pt went to Emergency room and was told she only has a sprain.  Pt is still in noted pain, so no standing exercises were added as were in last sessions plan.  Reviewed initial evaluation goals and HEP, which she was able to recall 4/5.  Upedated HEP to include prone extensions, POE and sit to stand.  Encouraged patient to spend time in prone as this seems to lessen her symtoms.   Rehab Potential  Good   PT Frequency 1x / week   PT Duration 4 weeks   PT Treatment/Interventions ADLs/Self Care Home Management;Patient/family education;Therapeutic activities;Therapeutic exercise;Functional mobility  training;Manual techniques   PT Next Visit Plan check SI and review newly added therex from last session.  If ankle pain is decreased,  begin heel raises, functional squats and  lunges.  Progress to t-band and wall squats.  Pt has no insurance so attempt to cover as much information in one session as possible.    PT Home Exercise Plan updated to include sit to stands, POE and prone hip extensions   Consulted and Agree with Plan of Care Patient      Patient will benefit from skilled therapeutic intervention in order to improve the following deficits and impairments:  Decreased activity tolerance, Decreased strength, Difficulty walking, Postural dysfunction, Pain  Visit Diagnosis: Pain in right hip  Pain in left hip  Difficulty in walking, not elsewhere classified  Muscle weakness (generalized)     Problem List Patient Active Problem List   Diagnosis Date Noted  . GERD (gastroesophageal reflux disease) 10/13/2015  . Gastritis 08/02/2015  . Chronic obstructive pulmonary disease ([place]) 07/14/2015  . Obstructive chronic bronchitis without exacerbation ([place]) 04/13/2015  . Headache, tension type, chronic 04/13/2015  . Anxiety state 02/27/2015  . Dysphagia, pharyngoesophageal phase 01/15/2015  . Loss of weight 01/15/2015  . FH: colon cancer 01/15/2015  . Hepatitis C, chronic ([place]) 01/15/2015  . Anemia 01/15/2015  . Acute respiratory failure with hypoxia ([place]) 01/08/2013  . CAP (community acquired pneumonia) 01/08/2013  . Leukocytosis, unspecified 01/08/2013  . Sinus tachycardia ([place]) 01/08/2013  . Hyperlipidemia 11/26/2008  . UNSPECIFIED ANEMIA 11/26/2008  . TOBACCO USER 11/11/2008  . MIGRAINE HEADACHE 11/11/2008  . Essential hypertension, benign 11/11/2008  . ALLERGIC RHINITIS, SEASONAL 11/11/2008  . BRONCHITIS, CHRONIC 11/11/2008  . ARTHRITIS 11/11/2008  . Lesion of ulnar nerve 12/04/2007  . CARPAL TUNNEL SYNDROME 10/08/2007  . TRIGGER FINGER 06/27/2007    Teena Irani,  PTA/CLT [phone]  12/15/2015, 12:08 PM  [place] [address] [place], [place], [address] Phone: [phone]   Fax:  [phone]  Name: Cynthia [name] MRN: [id number] Date of Birth: October 28, 1950

## 2015-12-15 NOTE — Patient Instructions (Signed)
Functional Quadriceps: Sit to Stand    Sit on edge of chair, feet flat on floor. Stand upright, extending knees fully. Repeat _10___ times per set. Do __2__ sessions per day.    HIP / KNEE: Extension - Prone    Squeeze glutes. Raise leg up. Keep knee straight. _10__ reps per set, __2_ sets per day    On Elbows (Prone)    Rise up on elbows as high as possible, keeping hips on floor. Hold __30__ seconds. Do __2__ sessions per day.

## 2015-12-21 ENCOUNTER — Ambulatory Visit (HOSPITAL_COMMUNITY): Payer: Self-pay

## 2015-12-21 DIAGNOSIS — M25552 Pain in left hip: Secondary | ICD-10-CM

## 2015-12-21 DIAGNOSIS — R262 Difficulty in walking, not elsewhere classified: Secondary | ICD-10-CM

## 2015-12-21 DIAGNOSIS — M6281 Muscle weakness (generalized): Secondary | ICD-10-CM

## 2015-12-21 DIAGNOSIS — M25551 Pain in right hip: Secondary | ICD-10-CM

## 2015-12-21 NOTE — Therapy (Signed)
[place], [place], [address] Phone: [phone]   Fax:  [phone]  Physical Therapy Treatment  Patient Details  Name: Cynthia [name] MRN: [id number] Date of Birth: 01/26/1951 Referring Provider: Dr. Arther Abbott  Encounter Date: 12/21/2015      PT End of Session - 12/21/15 1116    Visit Number 3   Number of Visits 4   Date for PT Re-Evaluation 12/30/15   Authorization Type no insurance   PT Start Time 1035   PT Stop Time 1118   PT Time Calculation (min) 43 min   Activity Tolerance Patient tolerated treatment well   Behavior During Therapy [facility] for tasks assessed/performed      Past Medical History  Diagnosis Date  . Migraine   . Hypertension   . Bronchitis   . High blood cholesterol level   . PTSD (post-traumatic stress disorder)   . COPD (chronic obstructive pulmonary disease) ([place])   . Pneumonia   . CHF (congestive heart failure) ([place])   . GERD (gastroesophageal reflux disease)   . Angina pectoris ([place])   . Arthritis   . Bronchitis   . Migraines   . Anxiety   . Depression   . HCV (hepatitis C virus)     Past Surgical History  Procedure Laterality Date  . Left arm      ? nerve repaired, " it was pinched".  . Tonsillectomy    . Colonoscopy with propofol N/A 02/09/2015    SLF: 1. the examined terminal ileum appeared to be normal 2. the colonic mucosa appeared normal 3. small internal hemorrhoids  . Esophagogastroduodenoscopy (egd) with propofol N/A 02/09/2015    SLF: 1. stricture at the gastroesophageal junction 2. mild non-erosive gastritis   . Savory dilation N/A 02/09/2015    Procedure: SAVORY DILATION 12.12m, 154m [id number]: SandDanie Binder;  Location: [facility];  Service: Endoscopy;  Laterality: N/A;  . Biopsy  02/09/2015    Procedure: GASTRIC BIOPSIES ;  Surgeon: SandDanie Binder;  Location: [facility];  Service: Endoscopy;;  . Left elbow surgery Left     There were no  vitals filed for this visit.      Subjective Assessment - 12/21/15 1038    Subjective Pt stated she has groin pain today, throbbing sometimes sharp pain pain scale 6/10.  Reports compliance with HEP daily.   Pertinent History Hepatitis C; HTN, heart murmer.    Patient Stated Goals Reduce back pain    Currently in Pain? Yes   Pain Score 6    Pain Location Groin   Pain Orientation Right;Left   Pain Descriptors / Indicators Throbbing;Sharp   Pain Type Acute pain   Pain Radiating Towards inguinal and buttock   Pain Onset More than a month ago   Pain Frequency Constant   Aggravating Factors  walking   Pain Relieving Factors tylonel, no longer taking pain meds   Effect of Pain on Daily Activities Tired quickly, unable to stand for prolonged period of time            [facility] Treatment/Exercise - 12/21/15 0001    Lumbar Exercises: Stretches   Prone on Elbows Stretch 1 rep;60 seconds   Lumbar Exercises: Standing   Heel Raises 10 reps   Heel Raises Limitations Toe raises   Functional Squats 10 reps   Functional Squats Limitations cueing for form, utilized chair behind for proper form   Forward Lunge  10 reps   Forward Lunge Limitations Bil LE on 6in step   Other Standing Lumbar Exercises 3D hip excursion x 3 sets   Other Standing Lumbar Exercises Gait training for heel to toe and to increase stride length   Lumbar Exercises: Seated   Sit to Stand 10 reps   Sit to Stand Limitations no UE's   Lumbar Exercises: Supine   Bridge 15 reps   Lumbar Exercises: Prone   Straight Leg Raise 10 reps   Straight Leg Raises Limitations 2 sets   Manual Therapy   Manual Therapy Muscle Energy Technique   Muscle Energy Technique Not needed this session as in good alignment             PT Short Term Goals - 11/30/15 1207    PT SHORT TERM GOAL #1   Title Pt core strength one half grade to allow pt to be able to roll in bed without inceased pain    Time 2   Period Weeks   Status New    PT SHORT TERM GOAL #2   Title Pt Bilateral back and hip  pain to be no greater than a 4/10 to allow patient to be able to walk for 15 minutes.   Time 2   Period Weeks   Status New   PT SHORT TERM GOAL #3   Title Pt to be able to sit for 45 minutes without having increased pain   Time 2   Period Weeks   Status New   PT SHORT TERM GOAL #4   Title Pt B  LE strength to be increased 1/2 grade to allow pt to come sit to stand without difficulty.    Time 2   Period Weeks   Status New           PT Long Term Goals - 11/30/15 1216    PT LONG TERM GOAL #1   Title Pt core strength and  bilatereal LE strength to be increased one grade to allow her to ascend and descend 5 steps without increased back or hip pain    Time 4   Period Weeks   Status New   PT LONG TERM GOAL #2   Title Pt back and bilateral hip pain pain to be no greater than a 2/10 to allow pt to be able to complete her housework without having to take breaks.    Time 4   Period Weeks   PT LONG TERM GOAL #3   Title Pt to be able to walk for 20 mintues at a time to allow shopping for groceries and going to comminity events easier    Time 4   Period Weeks   Status New   PT LONG TERM GOAL #4   Title Pt to be independent in an advance HEP to allow pt to be ready for discharge.    Time 4   Period Weeks   Status New               Plan - 12/21/15 1833    Clinical Impression Statement Pt entered dept today with no reports of ankle pain, did c/o Bil groin pain initially this session. SI checked with good alignment noted, no MET necessary this session.  Session focus on LE strengthing, progressed to CKC for functional strengthening.  Pt able to demonstrate good form wtih majoriyt of exercises with therapist facilitation for proper technqiue.  Gait training complete to improve heel to toe gait mechanics and increase  stride length for more normalized gait mechanics.  End of session pt reports groin pain reduced to 4/10.      Rehab Potential Good   PT Frequency 1x / week   PT Duration 4 weeks   PT Treatment/Interventions ADLs/Self Care Home Management;Patient/family education;Therapeutic activities;Therapeutic exercise;Functional mobility training;Manual techniques   PT Next Visit Plan check SI and review newly added therex from last session.  Progress to t-band and wall squats.  Pt has no insurance so attempt to cover as much information in one session as possible.       Patient will benefit from skilled therapeutic intervention in order to improve the following deficits and impairments:  Decreased activity tolerance, Decreased strength, Difficulty walking, Postural dysfunction, Pain  Visit Diagnosis: Pain in right hip  Pain in left hip  Difficulty in walking, not elsewhere classified  Muscle weakness (generalized)     Problem List Patient Active Problem List   Diagnosis Date Noted  . GERD (gastroesophageal reflux disease) 10/13/2015  . Gastritis 08/02/2015  . Chronic obstructive pulmonary disease ([place]) 07/14/2015  . Obstructive chronic bronchitis without exacerbation ([place]) 04/13/2015  . Headache, tension type, chronic 04/13/2015  . Anxiety state 02/27/2015  . Dysphagia, pharyngoesophageal phase 01/15/2015  . Loss of weight 01/15/2015  . FH: colon cancer 01/15/2015  . Hepatitis C, chronic ([place]) 01/15/2015  . Anemia 01/15/2015  . Acute respiratory failure with hypoxia ([place]) 01/08/2013  . CAP (community acquired pneumonia) 01/08/2013  . Leukocytosis, unspecified 01/08/2013  . Sinus tachycardia ([place]) 01/08/2013  . Hyperlipidemia 11/26/2008  . UNSPECIFIED ANEMIA 11/26/2008  . TOBACCO USER 11/11/2008  . MIGRAINE HEADACHE 11/11/2008  . Essential hypertension, benign 11/11/2008  . ALLERGIC RHINITIS, SEASONAL 11/11/2008  . BRONCHITIS, CHRONIC 11/11/2008  . ARTHRITIS 11/11/2008  . Lesion of ulnar nerve 12/04/2007  . CARPAL TUNNEL SYNDROME 10/08/2007  . TRIGGER FINGER 06/27/2007   Ihor Austin, LPTA; [place]  Aldona Lento 12/21/2015, 6:38 PM  [place], [place], [address] Phone: [phone]   Fax:  [phone]  Name: SRAH AKE MRN: [id number] Date of Birth: Jan 03, 1951

## 2015-12-28 ENCOUNTER — Telehealth (HOSPITAL_COMMUNITY): Payer: Self-pay | Admitting: Physical Therapy

## 2015-12-28 ENCOUNTER — Encounter (HOSPITAL_COMMUNITY): Payer: Self-pay | Admitting: Physical Therapy

## 2015-12-28 NOTE — Telephone Encounter (Signed)
Therapist called pt re missed appointment.  Pt apologized for not calling states that she does not feel well and will be at the 01/04/16 appointment.   Cynthia [name], [place] CLT [phone]

## 2016-01-04 ENCOUNTER — Ambulatory Visit (HOSPITAL_COMMUNITY): Payer: Self-pay

## 2016-01-04 DIAGNOSIS — R262 Difficulty in walking, not elsewhere classified: Secondary | ICD-10-CM

## 2016-01-04 DIAGNOSIS — M6281 Muscle weakness (generalized): Secondary | ICD-10-CM

## 2016-01-04 DIAGNOSIS — M25552 Pain in left hip: Secondary | ICD-10-CM

## 2016-01-04 DIAGNOSIS — M25551 Pain in right hip: Secondary | ICD-10-CM

## 2016-01-04 NOTE — Therapy (Signed)
[place] [facility] [address] [place], [place], [address] Phone: [phone]   Fax:  [phone] PHYSICAL THERAPY DISCHARGE SUMMARY  Visits from Start of Care: 4  Current functional level related to goals / functional outcomes: *see below   Remaining deficits: *see below   Education / Equipment: *see below Plan: Patient agrees to discharge.  Patient goals were not met. Patient is being discharged due to financial reasons.  ?????          Physical Therapy Treatment  Patient Details  Name: Cynthia [name] MRN: [id number] Date of Birth: [birth year] Referring Provider: Arther Abbott  Encounter Date: 01/04/2016      PT End of Session - 01/04/16 1249    Visit Number 4   Number of Visits 4   Date for PT Re-Evaluation 12/30/15   Authorization Type no insurance   PT Start Time 1130   PT Stop Time 1205   PT Time Calculation (min) 35 min   Equipment Utilized During Treatment Gait belt   Activity Tolerance Patient tolerated treatment well   Behavior During Therapy [facility] for tasks assessed/performed      Past Medical History  Diagnosis Date  . Migraine   . Hypertension   . Bronchitis   . High blood cholesterol level   . PTSD (post-traumatic stress disorder)   . COPD (chronic obstructive pulmonary disease) ([place])   . Pneumonia   . CHF (congestive heart failure) ([place])   . GERD (gastroesophageal reflux disease)   . Angina pectoris ([place])   . Arthritis   . Bronchitis   . Migraines   . Anxiety   . Depression   . HCV (hepatitis C virus)     Past Surgical History  Procedure Laterality Date  . Left arm      ? nerve repaired, " it was pinched".  . Tonsillectomy    . Colonoscopy with propofol N/A 02/09/2015    SLF: 1. the examined terminal ileum appeared to be normal 2. the colonic mucosa appeared normal 3. small internal hemorrhoids  . Esophagogastroduodenoscopy (egd) with propofol N/A 02/09/2015    SLF: 1. stricture at the  gastroesophageal junction 2. mild non-erosive gastritis   . Savory dilation N/A 02/09/2015    Procedure: SAVORY DILATION 12.37m, 173m [id number]: SandDanie Binder;  Location: [facility];  Service: Endoscopy;  Laterality: N/A;  . Biopsy  02/09/2015    Procedure: GASTRIC BIOPSIES ;  Surgeon: SandDanie Binder;  Location: [facility];  Service: Endoscopy;;  . Left elbow surgery Left     There were no vitals filed for this visit.      Subjective Assessment - 01/04/16 1131    Subjective Pt reports she has been doing ok, trying to get exercises done, but she is limited by generally not feeling due to some HEP-C mediations. R hip continues to bother her but is feeling somewhat better.    Pertinent History Hepatitis C; HTN, heart murmer.    How long can you sit comfortably? Pt is able to sit for 20-30 minutes and then her hips begin to bother her. (no change since 1MA)    How long can you stand comfortably? Able to stance for about 35 minutes (Able to stand for 30 minutes at eval 1MA)   How long can you walk comfortably? Able to walk abotu 10 minutes (Pt has increased pain after walking for five minutes at eval 1MA)    Patient Stated Goals Reduce back  pain (patient reports that it is better)    Currently in Pain? Yes   Pain Score 4    Pain Location Groin   Pain Orientation Right   Pain Descriptors / Indicators Throbbing   Pain Type Acute pain   Pain Onset More than a month ago   Pain Frequency Intermittent   Aggravating Factors  prolonged standing, walking, squating, bending to floor.    Pain Relieving Factors sitting            [facility] PT Assessment - 01/04/16 0001    Assessment   Medical Diagnosis lumbar radiculopathy   Referring Provider Arther Abbott   Onset Date/Surgical Date 09/22/15   Next MD Visit nothing scheduled at this time   will likely follow up   Prior Therapy none   Balance Screen   Has the patient fallen in the past 6 months Yes   How many times? 1   Has the  patient had a decrease in activity level because of a fear of falling?  --  no change since eval; decreased from baseline.    Is the patient reluctant to leave their home because of a fear of falling?  No   Prior Function   Level of Independence Independent   Posture/Postural Control   Posture Comments R iliac crest, R ASIS lower than contralateral side    Rt shoulder, iliac crest, Rt PSIS high: RT ASIS low at eval   Strength   Right Hip Flexion 4+/5  (4-/5 at eval)    Left Hip Flexion 4+/5  (4-/5 at eval)    Right Knee Flexion 5/5  (not tested at eval)   Right Knee Extension 4+/5  (4/5 at eval)   Left Knee Flexion 4+/5  (not tested at eval)   Left Knee Extension 5/5  (4/5 at eval)    Right Ankle Dorsiflexion 5/5   Left Ankle Dorsiflexion 5/5                     [facility] Adult PT Treatment/Exercise - 01/04/16 0001    Lumbar Exercises: Standing   Other Standing Lumbar Exercises Safety Sit to Stand x10 (HEP Traininig)    Other Standing Lumbar Exercises Marching in place  10x bila t(HEP training)    Lumbar Exercises: Supine   Bridge 10 reps  c pillow squeeze (HEP training)    Lumbar Exercises: Quadruped   Straight Leg Raise 5 reps  bilat (HEP training)                PT Education - 01/04/16 1249    Education provided Yes   Education Details [place] in [place] and [place].           PT Short Term Goals - 11/30/15 1207    PT SHORT TERM GOAL #1   Title Pt core strength one half grade to allow pt to be able to roll in bed without inceased pain    Time 2   Period Weeks   Status New   PT SHORT TERM GOAL #2   Title Pt Bilateral back and hip  pain to be no greater than a 4/10 to allow patient to be able to walk for 15 minutes.   Time 2   Period Weeks   Status New   PT SHORT TERM GOAL #3   Title Pt to be able to sit for 45 minutes without having increased pain   Time 2   Period Weeks   Status  New   PT SHORT TERM GOAL #4   Title Pt  B  LE strength to be increased 1/2 grade to allow pt to come sit to stand without difficulty.    Time 2   Period Weeks   Status New           PT Long Term Goals - 11/30/15 1216    PT LONG TERM GOAL #1   Title Pt core strength and  bilatereal LE strength to be increased one grade to allow her to ascend and descend 5 steps without increased back or hip pain    Time 4   Period Weeks   Status New   PT LONG TERM GOAL #2   Title Pt back and bilateral hip pain pain to be no greater than a 2/10 to allow pt to be able to complete her housework without having to take breaks.    Time 4   Period Weeks   PT LONG TERM GOAL #3   Title Pt to be able to walk for 20 mintues at a time to allow shopping for groceries and going to comminity events easier    Time 4   Period Weeks   Status New   PT LONG TERM GOAL #4   Title Pt to be independent in an advance HEP to allow pt to be ready for discharge.    Time 4   Period Weeks   Status New               Plan - 01/04/16 1251    Clinical Impression Statement Today is patient's l;ast scheduled visit, and DC arrangements are prepared. She has made imporvements in strength, and some improvments in tolerance to functional mobility, but has been mostly ISQ in all other areas. The pattient will be unable to continue with PT at this time due to financial hardship. HEP is updated and reviewed in ful.. Pt encouraged to seek out services from [facility] clinics in area. Progress towards goals met partially.     Rehab Potential Good   PT Frequency 1x / week   PT Duration 4 weeks   PT Treatment/Interventions ADLs/Self Care Home Management;Patient/family education;Therapeutic activities;Therapeutic exercise;Functional mobility training;Manual techniques   PT Home Exercise Plan *see note.    Consulted and Agree with Plan of Care Patient      Patient will benefit from skilled therapeutic intervention in order to improve the following deficits and impairments:   Decreased activity tolerance, Decreased strength, Difficulty walking, Postural dysfunction, Pain  Visit Diagnosis: Pain in right hip  Pain in left hip  Difficulty in walking, not elsewhere classified  Muscle weakness (generalized)     Problem List Patient Active Problem List   Diagnosis Date Noted  . GERD (gastroesophageal reflux disease) 10/13/2015  . Gastritis 08/02/2015  . Chronic obstructive pulmonary disease ([place]) 07/14/2015  . Obstructive chronic bronchitis without exacerbation ([place]) 04/13/2015  . Headache, tension type, chronic 04/13/2015  . Anxiety state 02/27/2015  . Dysphagia, pharyngoesophageal phase 01/15/2015  . Loss of weight 01/15/2015  . FH: colon cancer 01/15/2015  . Hepatitis C, chronic ([place]) 01/15/2015  . Anemia 01/15/2015  . Acute respiratory failure with hypoxia ([place]) 01/08/2013  . CAP (community acquired pneumonia) 01/08/2013  . Leukocytosis, unspecified 01/08/2013  . Sinus tachycardia ([place]) 01/08/2013  . Hyperlipidemia 11/26/2008  . UNSPECIFIED ANEMIA 11/26/2008  . TOBACCO USER 11/11/2008  . MIGRAINE HEADACHE 11/11/2008  . Essential hypertension, benign 11/11/2008  . ALLERGIC RHINITIS, SEASONAL 11/11/2008  . BRONCHITIS, CHRONIC 11/11/2008  .  ARTHRITIS 11/11/2008  . Lesion of ulnar nerve 12/04/2007  . CARPAL TUNNEL SYNDROME 10/08/2007  . TRIGGER FINGER 06/27/2007    12:57 PM, 01/04/2016 Etta Grandchild, PT, DPT Physical Therapist at [facility] [phone] (office)      [place] [address] [place], [place], [address] Phone: [phone]   Fax:  [phone]  Name: Cynthia [name] MRN: [id number] Date of Birth: 1951/06/21

## 2016-01-07 ENCOUNTER — Other Ambulatory Visit: Payer: Self-pay

## 2016-01-07 ENCOUNTER — Other Ambulatory Visit: Payer: Self-pay | Admitting: Gastroenterology

## 2016-01-07 DIAGNOSIS — B182 Chronic viral hepatitis C: Secondary | ICD-10-CM

## 2016-01-10 ENCOUNTER — Other Ambulatory Visit: Payer: Self-pay | Admitting: Physician Assistant

## 2016-01-10 ENCOUNTER — Encounter: Payer: Self-pay | Admitting: Student

## 2016-01-10 NOTE — Progress Notes (Signed)
Pt called requesting refill on Fioricet. Fioricet was last filled on 12-13-15. PA advised she will fill it on 01-12-16, and not sooner. PA advised that she gave patient a month's worth supply and stated she had spoken to patient in the past about only refilling in exactly a month. Patient was reminded of this. Patient was also reminded that Fioricet is a controlled substance and PA is trying to keep her off of it as much as possible. Patient stated that PA did not want to give her her medicine, and patient was then again reminded that PA was willing to give her another refill but that patient had to wait until the 5th of the month. Pt then said "okay, bye" and hung up the phone.

## 2016-01-13 ENCOUNTER — Encounter: Payer: Self-pay | Admitting: Physician Assistant

## 2016-01-13 ENCOUNTER — Ambulatory Visit: Payer: Self-pay | Admitting: Physician Assistant

## 2016-01-13 VITALS — BP 144/76 | HR 84 | Temp 97.5°F | Ht 64.0 in | Wt 145.6 lb

## 2016-01-13 DIAGNOSIS — F411 Generalized anxiety disorder: Secondary | ICD-10-CM

## 2016-01-13 DIAGNOSIS — I10 Essential (primary) hypertension: Secondary | ICD-10-CM

## 2016-01-13 DIAGNOSIS — Z1239 Encounter for other screening for malignant neoplasm of breast: Secondary | ICD-10-CM

## 2016-01-13 DIAGNOSIS — K219 Gastro-esophageal reflux disease without esophagitis: Secondary | ICD-10-CM

## 2016-01-13 DIAGNOSIS — G44229 Chronic tension-type headache, not intractable: Secondary | ICD-10-CM

## 2016-01-13 DIAGNOSIS — E785 Hyperlipidemia, unspecified: Secondary | ICD-10-CM

## 2016-01-13 MED ORDER — AMLODIPINE BESYLATE 10 MG PO TABS: 10.0000 mg | ORAL_TABLET | Freq: Every day | ORAL | Status: DC

## 2016-01-13 NOTE — Progress Notes (Signed)
BP 144/76 mmHg  Pulse 84  Temp(Src) 97.5 F (36.4 C)  Ht '5\' 4"'$  (1.626 m)  Wt 145 lb 9.6 oz (66.044 kg)  BMI 24.98 kg/m2  SpO2 97%   Subjective:    Patient ID: Cynthia [name], female    DOB: Jan 20, 1951, 65 y.o.   MRN: [id number]  HPI: Cynthia [name] is a 65 y.o. female presenting on 01/13/2016 for Hypertension; Hyperlipidemia; Anemia; and Medication Management   HPI   Chief Complaint  Patient presents with  . Hypertension  . Hyperlipidemia  . Anemia  . Medication Management    pt wants to know if she needs to keep taking omeprazole     [facility] [place] [facility]  Pt states no changes or new problems since her last OV  Relevant past medical, surgical, family and social history reviewed and updated as indicated. Interim medical history since our last visit reviewed. Allergies and medications reviewed and updated.  Current outpatient prescriptions:  .  albuterol (PROVENTIL) (2.5 MG/3ML) 0.083% nebulizer solution, Take 3 mLs (2.5 mg total) by nebulization every 6 (six) hours as needed for wheezing or shortness of breath., Disp: 150 mL, Rfl: 1 .  amLODipine (NORVASC) 5 MG tablet, Take 1 tablet (5 mg total) by mouth daily., Disp: 30 tablet, Rfl: 4 .  budesonide (PULMICORT) 180 MCG/ACT inhaler, Inhale 2 puffs into the lungs 2 (two) times daily., Disp: 3 Inhaler, Rfl: 2 .  butalbital-acetaminophen-caffeine (FIORICET, ESGIC) 50-325-40 MG tablet, TAKE 1 TO 2 TABLETS BY MOUTH EVERY 8 HOURS AS NEEDED FOR HEADACHE, Disp: 20 tablet, Rfl: 0 .  FLUoxetine (PROZAC) 10 MG capsule, Take 40 mg by mouth daily. , Disp: , Rfl:  .  LORazepam (ATIVAN) 0.5 MG tablet, Take 0.5 mg by mouth every 12 (twelve) hours as needed for anxiety. , Disp: , Rfl:  .  mirtazapine (REMERON) 15 MG tablet, Take 15 mg by mouth at bedtime., Disp: , Rfl:  .  nitroGLYCERIN (NITROSTAT) 0.4 MG SL tablet, Place 1 tablet (0.4 mg total) under the tongue every 5 (five) minutes as needed for chest  pain., Disp: 25 tablet, Rfl: 6 .  omeprazole (PRILOSEC) 20 MG capsule, Take 1 capsule (20 mg total) by mouth daily., Disp: 90 capsule, Rfl: 1 .  propranolol (INDERAL) 80 MG tablet, Take 1 tablet (80 mg total) by mouth 2 (two) times daily., Disp: 180 tablet, Rfl: 3 .  simvastatin (ZOCOR) 20 MG tablet, TAKE 1 TABLET BY MOUTH EVERY NIGHT AT BEDTIME FOR CHOLESTEROL, Disp: 30 tablet, Rfl: 5 .  traZODone (DESYREL) 50 MG tablet, Take 50 mg by mouth at bedtime as needed for sleep. , Disp: , Rfl:   Review of Systems  Constitutional: Positive for diaphoresis. Negative for fever, chills, appetite change, fatigue and unexpected weight change.  HENT: Positive for sneezing. Negative for congestion, dental problem, drooling, ear pain, facial swelling, hearing loss, mouth sores, sore throat, trouble swallowing and voice change.   Eyes: Negative for pain, discharge, redness, itching and visual disturbance.  Respiratory: Positive for cough and shortness of breath. Negative for choking and wheezing.   Cardiovascular: Negative for chest pain, palpitations and leg swelling.  Gastrointestinal: Negative for vomiting, abdominal pain, diarrhea, constipation and blood in stool.  Endocrine: Negative for cold intolerance, heat intolerance and polydipsia.  Genitourinary: Negative for dysuria, hematuria and decreased urine volume.  Musculoskeletal: Positive for back pain, arthralgias and gait problem.  Skin: Negative for rash.  Allergic/Immunologic: Negative for environmental allergies.  Neurological: Positive for headaches. Negative for seizures, syncope and light-headedness.  Hematological: Negative for adenopathy.  Psychiatric/Behavioral: Positive for dysphoric mood and agitation. Negative for suicidal ideas. The patient is nervous/anxious.     Per HPI unless specifically indicated above     Objective:    BP 144/76 mmHg  Pulse 84  Temp(Src) 97.5 F (36.4 C)  Ht '5\' 4"'$  (1.626 m)  Wt 145 lb 9.6 oz (66.044 kg)   BMI 24.98 kg/m2  SpO2 97%  Wt Readings from Last 3 Encounters:  01/13/16 145 lb 9.6 oz (66.044 kg)  12/08/15 145 lb (65.772 kg)  11/17/15 142 lb 3.2 oz (64.501 kg)    Physical Exam  Constitutional: She is oriented to person, place, and time. She appears well-developed and well-nourished.  HENT:  Head: Normocephalic and atraumatic.  Neck: Neck supple.  Cardiovascular: Normal rate and regular rhythm.   Pulmonary/Chest: Effort normal and breath sounds normal.  Abdominal: Soft. Bowel sounds are normal. She exhibits no mass. There is no hepatosplenomegaly. There is no tenderness.  Musculoskeletal: She exhibits no edema.  Lymphadenopathy:    She has no cervical adenopathy.  Neurological: She is alert and oriented to person, place, and time.  Skin: Skin is warm and dry.  Psychiatric: She has a normal mood and affect. Her behavior is normal.  Vitals reviewed.       Assessment & Plan:   Encounter Diagnoses  Name Primary?  . Essential hypertension, benign Yes  . Chronic tension-type headache, not intractable   . Hyperlipidemia   . Gastroesophageal reflux disease, esophagitis presence not specified   . Anxiety state   . Screening for breast cancer      -Discussed ppi for gerd- since occ symptoms, can continue the omeprazole -Check lipids- LFTs and cbc will be done later this month by GI -Increase amlodipine from '5mg'$  to '10mg'$ .   -will order Screening mammogram -F/u 3 months. RTO sooner prn

## 2016-01-15 LAB — LIPID PANEL
CHOL/HDL RATIO: 3.8 ratio (ref <=5.0)
Cholesterol: 198 mg/dL (ref 125–200)
HDL: 52 mg/dL (ref >=46)
LDL CALC: 120 mg/dL (ref <130)
Triglycerides: 129 mg/dL (ref <150)
VLDL: 26 mg/dL (ref <30)

## 2016-01-18 ENCOUNTER — Other Ambulatory Visit: Payer: Self-pay | Admitting: Physician Assistant

## 2016-01-18 MED ORDER — MOMETASONE FURO-FORMOTEROL FUM 100-5 MCG/ACT IN AERO: 2.0000 | INHALATION_SPRAY | Freq: Two times a day (BID) | RESPIRATORY_TRACT | Status: DC

## 2016-01-27 ENCOUNTER — Ambulatory Visit (HOSPITAL_COMMUNITY)
Admission: RE | Admit: 2016-01-27 | Discharge: 2016-01-27 | Disposition: A | Discharge status: Home / Self Care | Payer: Self-pay | Source: Ambulatory Visit | Attending: Physician Assistant | Admitting: Physician Assistant

## 2016-01-27 ENCOUNTER — Other Ambulatory Visit: Payer: Self-pay | Admitting: Physician Assistant

## 2016-01-27 ENCOUNTER — Encounter (HOSPITAL_COMMUNITY): Payer: Self-pay

## 2016-01-27 DIAGNOSIS — Z1231 Encounter for screening mammogram for malignant neoplasm of breast: Secondary | ICD-10-CM

## 2016-02-14 ENCOUNTER — Other Ambulatory Visit: Payer: Self-pay | Admitting: Physician Assistant

## 2016-02-14 DIAGNOSIS — B182 Chronic viral hepatitis C: Secondary | ICD-10-CM

## 2016-02-17 ENCOUNTER — Ambulatory Visit (INDEPENDENT_AMBULATORY_CARE_PROVIDER_SITE_OTHER): Payer: Self-pay | Admitting: Gastroenterology

## 2016-02-17 ENCOUNTER — Encounter: Payer: Self-pay | Admitting: Gastroenterology

## 2016-02-17 VITALS — BP 136/82 | HR 82 | Temp 98.5°F | Ht 64.0 in | Wt 145.6 lb

## 2016-02-17 DIAGNOSIS — B9681 Helicobacter pylori [H. pylori] as the cause of diseases classified elsewhere: Secondary | ICD-10-CM

## 2016-02-17 DIAGNOSIS — B182 Chronic viral hepatitis C: Secondary | ICD-10-CM

## 2016-02-17 DIAGNOSIS — K2970 Gastritis, unspecified, without bleeding: Secondary | ICD-10-CM

## 2016-02-17 LAB — CBC WITH DIFFERENTIAL/PLATELET
BASOS ABS: 0 {cells}/uL (ref 0–200)
Basophils Relative: 0 %
EOS ABS: 372 {cells}/uL (ref 15–500)
Eosinophils Relative: 4 %
HEMATOCRIT: 35.7 % (ref 35.0–45.0)
Hemoglobin: 11.1 g/dL — ABNORMAL LOW (ref 11.7–15.5)
LYMPHS ABS: 2232 {cells}/uL (ref 850–3900)
LYMPHS PCT: 24 %
MCH: 27.3 pg (ref 27.0–33.0)
MCHC: 31.1 g/dL — AB (ref 32.0–36.0)
MCV: 87.7 fL (ref 80.0–100.0)
MPV: 10.4 fL (ref 7.5–12.5)
Monocytes Absolute: 744 cells/uL (ref 200–950)
Monocytes Relative: 8 %
NEUTROS PCT: 64 %
Neutro Abs: 5952 cells/uL (ref 1500–7800)
Platelets: 326 10*3/uL (ref 140–400)
RBC: 4.07 MIL/uL (ref 3.80–5.10)
RDW: 16 % — AB (ref 11.0–15.0)
WBC: 9.3 10*3/uL (ref 3.8–10.8)

## 2016-02-17 NOTE — Assessment & Plan Note (Signed)
Due for posttreatment HCV RNA quantitative test. She had some F3 score on elastography. Plan to repeat u/s with elastography in December and if persisting F3 score, would consider ongoing surveillance for hepatoma. Return to the office in 6 months.  Of note, patient complained of 4 week history of lower extremity edema. On exam today she had trace edema on the left, trace to 1+ on the right. Unlikely DVT but did discuss option of Doppler study, patient declined. If she has worsening or persistent swelling she will notify the [facility] clinic.

## 2016-02-17 NOTE — Progress Notes (Signed)
cc'ed to pcp °

## 2016-02-17 NOTE — Progress Notes (Signed)
Primary Care Physician: Soyla Dryer, PA-C  Primary Gastroenterologist:  Barney Drain, MD   Chief Complaint  Patient presents with  . Follow-up    states needs blood work    HPI: Cynthia [name] is a 65 y.o. female hereFor follow-up. Completed [place] in 12/2015. HCV not detected after first 4 weeks of therapy. She is going for her labs today including LFTs, CBC to follow-up anemia, HCV RNA quantitative. Overall she feels well. She continues to have back and hip pain followed by Dr. Aline Brochure. 4 week history of lower extremity edema although tells me it is much better today. Denies nausea or vomiting, heartburn. Remains on PPI. No abdominal pain. Weight is stable.  Current Outpatient Prescriptions  Medication Sig Dispense Refill  . albuterol (PROVENTIL) (2.5 MG/3ML) 0.083% nebulizer solution Take 3 mLs (2.5 mg total) by nebulization every 6 (six) hours as needed for wheezing or shortness of breath. 150 mL 1  . amLODipine (NORVASC) 5 MG tablet Take 5 mg by mouth daily.  2  . budesonide (PULMICORT) 180 MCG/ACT inhaler Inhale 2 puffs into the lungs 2 (two) times daily. 3 Inhaler 2  . butalbital-acetaminophen-caffeine (FIORICET, ESGIC) 50-325-40 MG tablet Take 1-2 tablets by mouth every 8 (eight) hours as needed for headache. 20 tablet 0  . FLUoxetine (PROZAC) 10 MG capsule Take 40 mg by mouth daily.     Marland Kitchen LORazepam (ATIVAN) 0.5 MG tablet Take 0.5 mg by mouth every 12 (twelve) hours as needed for anxiety.     . mirtazapine (REMERON) 15 MG tablet Take 15 mg by mouth at bedtime.    . mometasone-formoterol (DULERA) 100-5 MCG/ACT AERO Inhale 2 puffs into the lungs 2 (two) times daily. 3 Inhaler 2  . nitroGLYCERIN (NITROSTAT) 0.4 MG SL tablet Place 1 tablet (0.4 mg total) under the tongue every 5 (five) minutes as needed for chest pain. 25 tablet 6  . omeprazole (PRILOSEC) 20 MG capsule Take 1 capsule (20 mg total) by mouth daily. 90 capsule 1  . propranolol (INDERAL) 80 MG tablet Take 1  tablet (80 mg total) by mouth 2 (two) times daily. 180 tablet 3  . simvastatin (ZOCOR) 20 MG tablet TAKE 1 TABLET BY MOUTH EVERY NIGHT AT BEDTIME FOR CHOLESTEROL 30 tablet 5  . traZODone (DESYREL) 50 MG tablet Take 50 mg by mouth at bedtime as needed for sleep.      No current facility-administered medications for this visit.     Allergies as of 02/17/2016 - Review Complete 02/17/2016  Allergen Reaction Noted  . Aspirin  11/11/2008    ROS:  General: Negative for anorexia, weight loss, fever, chills, fatigue, weakness. ENT: Negative for hoarseness, difficulty swallowing , nasal congestion. CV: Negative for chest pain, angina, palpitations, dyspnea on exertion, complains of recent onset peripheral edema.  Respiratory: Negative for dyspnea at rest, dyspnea on exertion, cough, sputum, wheezing.  GI: See history of present illness. GU:  Negative for dysuria, hematuria, urinary incontinence, urinary frequency, nocturnal urination.  Endo: Negative for unusual weight change.    Physical Examination:   BP 136/82   Pulse 82   Temp 98.5 F (36.9 C) (Oral)   Ht '5\' 4"'$  (1.626 m)   Wt 145 lb 9.6 oz (66 kg)   BMI 24.99 kg/m   General: Well-nourished, well-developed in no acute distress.  Eyes: No icterus. Mouth: Oropharyngeal mucosa moist and pink , no lesions erythema or exudate. Lungs: Clear to auscultation bilaterally.  Heart: Regular rate and rhythm, no murmurs  rubs or gallops.  Abdomen: Bowel sounds are normal, nontender, nondistended, no hepatosplenomegaly or masses, no abdominal bruits or hernia , no rebound or guarding.   Extremities: Trace pedal edema on the left, trace to 1+ pedal edema on the right. Negative Homans sign. No clubbing or deformities. Neuro: Alert and oriented x 4   Skin: Warm and dry, no jaundice.   Psych: Alert and cooperative, normal mood and affect.  Imaging Studies: Ms Digital Screening Tomo Bilateral  Result Date: 02/10/2016 CLINICAL DATA:  Screening. EXAM:  2D DIGITAL SCREENING BILATERAL MAMMOGRAM WITH CAD AND ADJUNCT TOMO COMPARISON:  Previous exam(s). ACR Breast Density Category b: There are scattered areas of fibroglandular density. FINDINGS: There are no findings suspicious for malignancy. Images were processed with CAD. IMPRESSION: No mammographic evidence of malignancy. A result letter of this screening mammogram will be mailed directly to the patient. RECOMMENDATION: Screening mammogram in one year. (Code:SM-B-01Y) BI-RADS CATEGORY  1: Negative. Electronically Signed   By: Ammie Ferrier M.D.   On: 02/10/2016 13:57

## 2016-02-17 NOTE — Assessment & Plan Note (Signed)
Ready to check for eradication now that she is completed HCV treatment. Hold omeprazole for 2 weeks. Leg stool for H. pylori stool antigen. Resume omeprazole after collection.

## 2016-02-17 NOTE — Patient Instructions (Signed)
1. Stop omeprazole for 2 weeks, then collect stool specimen for H.pylori stool antigen.  2. Resume omeprazole once you collect stool specimen.  3. Ultrasound of your liver in 06/2016. 4. Return to the office in 08/2016 for follow up or sooner if needed. 5. If you leg swelling worsens or persists, please [facility] with [facility].

## 2016-02-18 LAB — HEPATIC FUNCTION PANEL
ALBUMIN: 4 g/dL (ref 3.6–5.1)
ALT: 11 U/L (ref 6–29)
AST: 15 U/L (ref 10–35)
Alkaline Phosphatase: 60 U/L (ref 33–130)
BILIRUBIN DIRECT: 0 mg/dL (ref <=0.2)
Indirect Bilirubin: 0.2 mg/dL (ref 0.2–1.2)
TOTAL PROTEIN: 7.2 g/dL (ref 6.1–8.1)
Total Bilirubin: 0.2 mg/dL (ref 0.2–1.2)

## 2016-02-21 LAB — HEPATITIS C RNA QUANTITATIVE: HCV Quantitative: NOT DETECTED IU/mL (ref <15)

## 2016-03-07 NOTE — Progress Notes (Signed)
LFTs normal. HCV negative!, Hgb stable.  Recommend ifobt for mild anemia. Return OV in six months. HCV RNA quantitative in 6 months, final check.

## 2016-03-08 ENCOUNTER — Other Ambulatory Visit: Payer: Self-pay

## 2016-03-08 ENCOUNTER — Other Ambulatory Visit: Payer: Self-pay | Admitting: Physician Assistant

## 2016-03-08 DIAGNOSIS — B182 Chronic viral hepatitis C: Secondary | ICD-10-CM

## 2016-03-08 DIAGNOSIS — K2970 Gastritis, unspecified, without bleeding: Principal | ICD-10-CM

## 2016-03-08 DIAGNOSIS — B9681 Helicobacter pylori [H. pylori] as the cause of diseases classified elsewhere: Secondary | ICD-10-CM

## 2016-03-08 NOTE — Progress Notes (Signed)
Pt is aware. Lab order on file for 09/06/2016 and iFOBT at front for pt to pick up.

## 2016-03-09 LAB — HELICOBACTER PYLORI  SPECIAL ANTIGEN: H. PYLORI Antigen: NOT DETECTED

## 2016-03-10 NOTE — Progress Notes (Signed)
H.pylori eradicated!

## 2016-03-15 ENCOUNTER — Other Ambulatory Visit: Payer: Self-pay | Admitting: Physician Assistant

## 2016-04-03 ENCOUNTER — Other Ambulatory Visit: Payer: Self-pay | Admitting: Physician Assistant

## 2016-04-05 ENCOUNTER — Other Ambulatory Visit: Payer: Self-pay | Admitting: Gastroenterology

## 2016-04-17 ENCOUNTER — Other Ambulatory Visit: Payer: Self-pay | Admitting: Physician Assistant

## 2016-04-18 ENCOUNTER — Ambulatory Visit: Payer: Self-pay | Admitting: Physician Assistant

## 2016-04-19 ENCOUNTER — Encounter: Payer: Self-pay | Admitting: Physician Assistant

## 2016-04-19 ENCOUNTER — Ambulatory Visit: Payer: Self-pay | Admitting: Physician Assistant

## 2016-04-19 VITALS — BP 140/80 | HR 77 | Temp 97.5°F | Ht 64.0 in | Wt 145.6 lb

## 2016-04-19 DIAGNOSIS — E785 Hyperlipidemia, unspecified: Secondary | ICD-10-CM

## 2016-04-19 DIAGNOSIS — F17219 Nicotine dependence, cigarettes, with unspecified nicotine-induced disorders: Secondary | ICD-10-CM

## 2016-04-19 DIAGNOSIS — I10 Essential (primary) hypertension: Secondary | ICD-10-CM

## 2016-04-19 DIAGNOSIS — J449 Chronic obstructive pulmonary disease, unspecified: Secondary | ICD-10-CM

## 2016-04-19 LAB — COMPREHENSIVE METABOLIC PANEL
ALBUMIN: 4.2 g/dL (ref 3.6–5.1)
ALT: 12 U/L (ref 6–29)
AST: 15 U/L (ref 10–35)
Alkaline Phosphatase: 76 U/L (ref 33–130)
BILIRUBIN TOTAL: 0.3 mg/dL (ref 0.2–1.2)
BUN: 7 mg/dL (ref 7–25)
CO2: 25 mmol/L (ref 20–31)
CREATININE: 0.84 mg/dL (ref 0.50–0.99)
Calcium: 9.4 mg/dL (ref 8.6–10.4)
Chloride: 106 mmol/L (ref 98–110)
Glucose, Bld: 120 mg/dL — ABNORMAL HIGH (ref 65–99)
Potassium: 3.9 mmol/L (ref 3.5–5.3)
SODIUM: 141 mmol/L (ref 135–146)
TOTAL PROTEIN: 7.3 g/dL (ref 6.1–8.1)

## 2016-04-19 LAB — LIPID PANEL
Cholesterol: 219 mg/dL — ABNORMAL HIGH (ref 125–200)
HDL: 57 mg/dL (ref >=46)
LDL Cholesterol: 133 mg/dL — ABNORMAL HIGH (ref <130)
Total CHOL/HDL Ratio: 3.8 Ratio (ref <=5.0)
Triglycerides: 147 mg/dL (ref <150)
VLDL: 29 mg/dL (ref <30)

## 2016-04-19 MED ORDER — LOSARTAN POTASSIUM 50 MG PO TABS: 50.0000 mg | ORAL_TABLET | Freq: Every day | ORAL | 1 refills | Status: DC

## 2016-04-19 NOTE — Patient Instructions (Signed)
Check fasting labs 

## 2016-04-19 NOTE — Progress Notes (Signed)
BP 140/80 (BP Location: Left Arm, Patient Position: Sitting, Cuff Size: Normal)   Pulse 77   Temp 97.5 F (36.4 C) (Other (Comment))   Ht '5\' 4"'$  (1.626 m)   Wt 145 lb 9.6 oz (66 kg)   SpO2 98%   BMI 24.99 kg/m    Subjective:    Patient ID: Cynthia [name], female    DOB: July 22, 1950, 65 y.o.   MRN: [id number]  HPI: Cynthia [name] is a 65 y.o. female presenting on 04/19/2016 for Hypertension and Hyperlipidemia   HPI [facility] goes to [facility] for [place]  Pt says she is doing okay.  Says she is doing same as always, nothing new  Relevant past medical, surgical, family and social history reviewed and updated as indicated. Interim medical history since our last visit reviewed. Allergies and medications reviewed and updated.   Current Outpatient Prescriptions:  .  albuterol (PROVENTIL) (2.5 MG/3ML) 0.083% nebulizer solution, Take 3 mLs (2.5 mg total) by nebulization every 6 (six) hours as needed for wheezing or shortness of breath., Disp: 150 mL, Rfl: 1 .  amLODipine (NORVASC) 10 MG tablet, Take 10 mg by mouth daily., Disp: , Rfl:  .  butalbital-acetaminophen-caffeine (FIORICET, ESGIC) 50-325-40 MG tablet, TAKE 1 TO 2 TABLETS BY MOUTH EVERY 8 HOURS AS NEEDED FOR HEADACHE, Disp: 20 tablet, Rfl: 0 .  fenoprofen (NALFON) 600 MG TABS tablet, Take 600 mg by mouth daily., Disp: , Rfl:  .  Ferrous Sulfate (IRON) 325 (65 Fe) MG TABS, Take 1 tablet by mouth daily., Disp: , Rfl:  .  FLUoxetine (PROZAC) 10 MG capsule, Take 40 mg by mouth daily. , Disp: , Rfl:  .  LORazepam (ATIVAN) 0.5 MG tablet, Take 0.5 mg by mouth every 12 (twelve) hours as needed for anxiety. , Disp: , Rfl:  .  mirtazapine (REMERON) 15 MG tablet, Take 15 mg by mouth at bedtime., Disp: , Rfl:  .  mometasone-formoterol (DULERA) 100-5 MCG/ACT AERO, Inhale 2 puffs into the lungs 2 (two) times daily., Disp: 3 Inhaler, Rfl: 2 .  nitroGLYCERIN (NITROSTAT) 0.4 MG SL tablet, Place 1 tablet (0.4 mg total) under the tongue every 5 (five)  minutes as needed for chest pain., Disp: 25 tablet, Rfl: 6 .  omeprazole (PRILOSEC) 20 MG capsule, TAKE 1 CAPSULE BY MOUTH ONCE DAILY BEFORE A MEAL, Disp: 60 capsule, Rfl: 3 .  propranolol (INDERAL) 80 MG tablet, Take 1 tablet (80 mg total) by mouth 2 (two) times daily., Disp: 180 tablet, Rfl: 3 .  simvastatin (ZOCOR) 20 MG tablet, TAKE 1 TABLET BY MOUTH EVERY NIGHT AT BEDTIME FOR CHOLESTEROL, Disp: 30 tablet, Rfl: 4 .  traZODone (DESYREL) 50 MG tablet, Take 50 mg by mouth at bedtime as needed for sleep. , Disp: , Rfl:  .  zinc gluconate 50 MG tablet, Take 50 mg by mouth daily., Disp: , Rfl:    Review of Systems  Respiratory: Positive for cough, shortness of breath and wheezing.   Cardiovascular: Positive for leg swelling.  Gastrointestinal: Negative for constipation.  Musculoskeletal: Positive for arthralgias and back pain.  Neurological: Positive for headaches.  Psychiatric/Behavioral: Positive for agitation and dysphoric mood. The patient is nervous/anxious.     Per HPI unless specifically indicated above     Objective:    BP 140/80 (BP Location: Left Arm, Patient Position: Sitting, Cuff Size: Normal)   Pulse 77   Temp 97.5 F (36.4 C) (Other (Comment))   Ht '5\' 4"'$  (1.626 m)   Wt 145 lb 9.6  oz (66 kg)   SpO2 98%   BMI 24.99 kg/m   Wt Readings from Last 3 Encounters:  04/19/16 145 lb 9.6 oz (66 kg)  02/17/16 145 lb 9.6 oz (66 kg)  01/13/16 145 lb 9.6 oz (66 kg)    Physical Exam  Constitutional: She is oriented to person, place, and time. She appears well-developed and well-nourished.  HENT:  Head: Normocephalic and atraumatic.  Neck: Neck supple.  Cardiovascular: Normal rate and regular rhythm.   Pulmonary/Chest: Effort normal and breath sounds normal.  Abdominal: Soft. Bowel sounds are normal. She exhibits no mass. There is no hepatosplenomegaly. There is no tenderness.  Musculoskeletal: She exhibits no edema.  Lymphadenopathy:    She has no cervical adenopathy.   Neurological: She is alert and oriented to person, place, and time.  Skin: Skin is warm and dry.  Vitals reviewed.       Assessment & Plan:   Encounter Diagnoses  Name Primary?  . Essential hypertension, benign Yes  . Hyperlipidemia, unspecified hyperlipidemia type   . Cigarette nicotine dependence with nicotine-induced disorder   . Chronic obstructive pulmonary disease, unspecified COPD type ([place])     -check lipids- pt to go to lab this week  -Add losartan for bp -pt to continue with [facility] for [facility] issues -counseled pt on smoking cessation -f/u 1 month to recheck bp

## 2016-05-15 ENCOUNTER — Telehealth: Payer: Self-pay | Admitting: Gastroenterology

## 2016-05-15 NOTE — Telephone Encounter (Signed)
Recall for ultrasound 

## 2016-05-16 NOTE — Telephone Encounter (Signed)
Letter mailed

## 2016-05-17 ENCOUNTER — Other Ambulatory Visit: Payer: Self-pay | Admitting: Physician Assistant

## 2016-05-18 ENCOUNTER — Other Ambulatory Visit: Payer: Self-pay | Admitting: Physician Assistant

## 2016-05-18 MED ORDER — BUTALBITAL-APAP-CAFFEINE 50-325-40 MG PO TABS: ORAL_TABLET | ORAL | 0 refills | Status: DC

## 2016-05-24 ENCOUNTER — Ambulatory Visit: Payer: Self-pay | Admitting: Physician Assistant

## 2016-06-06 ENCOUNTER — Encounter: Payer: Self-pay | Admitting: Physician Assistant

## 2016-06-06 ENCOUNTER — Ambulatory Visit: Payer: Self-pay | Admitting: Physician Assistant

## 2016-06-06 VITALS — BP 110/66 | HR 78 | Temp 98.2°F | Ht 64.0 in | Wt 144.0 lb

## 2016-06-06 DIAGNOSIS — F411 Generalized anxiety disorder: Secondary | ICD-10-CM

## 2016-06-06 DIAGNOSIS — D649 Anemia, unspecified: Secondary | ICD-10-CM

## 2016-06-06 DIAGNOSIS — G44229 Chronic tension-type headache, not intractable: Secondary | ICD-10-CM

## 2016-06-06 DIAGNOSIS — E785 Hyperlipidemia, unspecified: Secondary | ICD-10-CM

## 2016-06-06 DIAGNOSIS — F17219 Nicotine dependence, cigarettes, with unspecified nicotine-induced disorders: Secondary | ICD-10-CM

## 2016-06-06 DIAGNOSIS — J449 Chronic obstructive pulmonary disease, unspecified: Secondary | ICD-10-CM

## 2016-06-06 DIAGNOSIS — K219 Gastro-esophageal reflux disease without esophagitis: Secondary | ICD-10-CM

## 2016-06-06 DIAGNOSIS — I10 Essential (primary) hypertension: Secondary | ICD-10-CM

## 2016-06-06 NOTE — Progress Notes (Signed)
BP 110/66 (BP Location: Left Arm, Patient Position: Sitting, Cuff Size: Normal)   Pulse 78   Temp 98.2 F (36.8 C)   Ht '5\' 4"'$  (1.626 m)   Wt 144 lb (65.3 kg)   SpO2 96%   BMI 24.72 kg/m    Subjective:    Patient ID: Cynthia [name], female    DOB: 12/27/1950, 65 y.o.   MRN: [id number]  HPI: Cynthia [name] is a 65 y.o. female presenting on 06/06/2016 for Hypertension   HPI   Pt still going to [facility] for [place] issues  She is still smoking but says she has cut back  Pt wonders about bruise L arm  Relevant past medical, surgical, family and social history reviewed and updated as indicated. Interim medical history since our last visit reviewed. Allergies and medications reviewed and updated.   Current Outpatient Prescriptions:  .  albuterol (PROVENTIL) (2.5 MG/3ML) 0.083% nebulizer solution, Take 3 mLs (2.5 mg total) by nebulization every 6 (six) hours as needed for wheezing or shortness of breath., Disp: 150 mL, Rfl: 1 .  amLODipine (NORVASC) 10 MG tablet, Take 10 mg by mouth daily., Disp: , Rfl:  .  butalbital-acetaminophen-caffeine (FIORICET, ESGIC) 50-325-40 MG tablet, TAKE 1 TO 2 TABLETS BY MOUTH EVERY 8 HOURS AS NEEDED FOR HEADACHE, Disp: 20 tablet, Rfl: 0 .  Ferrous Sulfate (IRON) 325 (65 Fe) MG TABS, Take 1 tablet by mouth daily., Disp: , Rfl:  .  FLUoxetine (PROZAC) 10 MG capsule, Take 40 mg by mouth daily. , Disp: , Rfl:  .  LORazepam (ATIVAN) 0.5 MG tablet, Take 0.5 mg by mouth every 12 (twelve) hours as needed for anxiety. , Disp: , Rfl:  .  losartan (COZAAR) 50 MG tablet, Take 1 tablet (50 mg total) by mouth daily., Disp: 90 tablet, Rfl: 1 .  mirtazapine (REMERON) 15 MG tablet, Take 15 mg by mouth at bedtime., Disp: , Rfl:  .  mometasone-formoterol (DULERA) 100-5 MCG/ACT AERO, Inhale 2 puffs into the lungs 2 (two) times daily., Disp: 3 Inhaler, Rfl: 2 .  nitroGLYCERIN (NITROSTAT) 0.4 MG SL tablet, Place 1 tablet (0.4 mg total) under the tongue every 5 (five)  minutes as needed for chest pain., Disp: 25 tablet, Rfl: 6 .  omeprazole (PRILOSEC) 20 MG capsule, TAKE 1 CAPSULE BY MOUTH ONCE DAILY BEFORE A MEAL, Disp: 60 capsule, Rfl: 3 .  propranolol (INDERAL) 80 MG tablet, Take 1 tablet (80 mg total) by mouth 2 (two) times daily., Disp: 180 tablet, Rfl: 3 .  simvastatin (ZOCOR) 20 MG tablet, TAKE 1 TABLET BY MOUTH EVERY NIGHT AT BEDTIME FOR CHOLESTEROL, Disp: 30 tablet, Rfl: 4 .  traZODone (DESYREL) 50 MG tablet, Take 50-100 mg by mouth at bedtime as needed for sleep. , Disp: , Rfl:  .  zinc gluconate 50 MG tablet, Take 50 mg by mouth daily., Disp: , Rfl:  .  fenoprofen (NALFON) 600 MG TABS tablet, Take 600 mg by mouth daily., Disp: , Rfl:    Review of Systems  Constitutional: Positive for appetite change and diaphoresis. Negative for chills, fatigue, fever and unexpected weight change.  HENT: Positive for congestion and sneezing. Negative for dental problem, drooling, ear pain, facial swelling, hearing loss, mouth sores, sore throat, trouble swallowing and voice change.   Eyes: Negative for pain, discharge, redness, itching and visual disturbance.  Respiratory: Positive for cough, shortness of breath and wheezing. Negative for choking.   Cardiovascular: Negative for chest pain, palpitations and leg swelling.  Gastrointestinal: Negative  for abdominal pain, blood in stool, constipation, diarrhea and vomiting.  Endocrine: Negative for cold intolerance, heat intolerance and polydipsia.  Genitourinary: Positive for decreased urine volume. Negative for dysuria and hematuria.  Musculoskeletal: Negative for arthralgias, back pain and gait problem.  Skin: Negative for rash.  Allergic/Immunologic: Negative for environmental allergies.  Neurological: Positive for headaches. Negative for seizures, syncope and light-headedness.  Hematological: Negative for adenopathy.  Psychiatric/Behavioral: Positive for agitation and dysphoric mood. Negative for suicidal ideas.  The patient is nervous/anxious.     Per HPI unless specifically indicated above     Objective:    BP 110/66 (BP Location: Left Arm, Patient Position: Sitting, Cuff Size: Normal)   Pulse 78   Temp 98.2 F (36.8 C)   Ht '5\' 4"'$  (1.626 m)   Wt 144 lb (65.3 kg)   SpO2 96%   BMI 24.72 kg/m   Wt Readings from Last 3 Encounters:  06/06/16 144 lb (65.3 kg)  04/19/16 145 lb 9.6 oz (66 kg)  02/17/16 145 lb 9.6 oz (66 kg)    Physical Exam  Constitutional: She is oriented to person, place, and time. She appears well-developed and well-nourished.  HENT:  Head: Normocephalic and atraumatic.  Neck: Neck supple.  Cardiovascular: Normal rate and regular rhythm.   Pulmonary/Chest: Effort normal and breath sounds normal.  Abdominal: Soft. Bowel sounds are normal. She exhibits no mass. There is no hepatosplenomegaly. There is no tenderness.  Musculoskeletal: She exhibits no edema.       Left forearm: She exhibits no tenderness, no bony tenderness, no swelling, no edema, no deformity and no laceration.  Small fading bruise L forearm  Lymphadenopathy:    She has no cervical adenopathy.  Neurological: She is alert and oriented to person, place, and time.  Skin: Skin is warm and dry.  Psychiatric: She has a normal mood and affect. Her behavior is normal.  Vitals reviewed.      Results for orders placed or performed in visit on 04/19/16  Lipid Profile  Result Value Ref Range   Cholesterol 219 (H) 125 - 200 mg/dL   Triglycerides 147 <150 mg/dL   HDL 57 >=46 mg/dL   Total CHOL/HDL Ratio 3.8 <=5.0 Ratio   VLDL 29 <30 mg/dL   LDL Cholesterol 133 (H) <130 mg/dL  Comprehensive Metabolic Panel (CMET)  Result Value Ref Range   Sodium 141 135 - 146 mmol/L   Potassium 3.9 3.5 - 5.3 mmol/L   Chloride 106 98 - 110 mmol/L   CO2 25 20 - 31 mmol/L   Glucose, Bld 120 (H) 65 - 99 mg/dL   BUN 7 7 - 25 mg/dL   Creat 0.84 0.50 - 0.99 mg/dL   Total Bilirubin 0.3 0.2 - 1.2 mg/dL   Alkaline Phosphatase  76 33 - 130 U/L   AST 15 10 - 35 U/L   ALT 12 6 - 29 U/L   Total Protein 7.3 6.1 - 8.1 g/dL   Albumin 4.2 3.6 - 5.1 g/dL   Calcium 9.4 8.6 - 10.4 mg/dL      Assessment & Plan:   Encounter Diagnoses  Name Primary?  . Essential hypertension, benign Yes  . Hyperlipidemia, unspecified hyperlipidemia type   . Cigarette nicotine dependence with nicotine-induced disorder   . Chronic obstructive pulmonary disease, unspecified COPD type ([place])   . Chronic tension-type headache, not intractable   . Gastroesophageal reflux disease, esophagitis presence not specified   . Anxiety state   . Anemia, unspecified type     -  Reviewed labs with pt  -re-Counseled lowfat diet -re-counseled Smoking cessation -bp good- continue current meds -F/u 3 months.  RTO sooner prn

## 2016-06-06 NOTE — Patient Instructions (Signed)
Fat and Cholesterol Restricted Diet High levels of fat and cholesterol in your blood may lead to various health problems, such as diseases of the heart, blood vessels, gallbladder, liver, and pancreas. Fats are concentrated sources of energy that come in various forms. Certain types of fat, including saturated fat, may be harmful in excess. Cholesterol is a substance needed by your body in small amounts. Your body makes all the cholesterol it needs. Excess cholesterol comes from the food you eat. When you have high levels of cholesterol and saturated fat in your blood, health problems can develop because the excess fat and cholesterol will gather along the walls of your blood vessels, causing them to narrow. Choosing the right foods will help you control your intake of fat and cholesterol. This will help keep the levels of these substances in your blood within normal limits and reduce your risk of disease. What is my plan? Your health care provider recommends that you:  Limit your fat intake to ______% or less of your total calories per day.  Limit the amount of cholesterol in your diet to less than _________mg per day.  Eat 20-30 grams of fiber each day.  What types of fat should I choose?  Choose healthy fats more often. Choose monounsaturated and polyunsaturated fats, such as olive and canola oil, flaxseeds, walnuts, almonds, and seeds.  Eat more omega-3 fats. Good choices include salmon, mackerel, sardines, tuna, flaxseed oil, and ground flaxseeds. Aim to eat fish at least two times a week.  Limit saturated fats. Saturated fats are primarily found in animal products, such as meats, butter, and cream. Plant sources of saturated fats include palm oil, palm kernel oil, and coconut oil.  Avoid foods with partially hydrogenated oils in them. These contain trans fats. Examples of foods that contain trans fats are stick margarine, some tub margarines, cookies, crackers, and other baked goods. What  general guidelines do I need to follow? These guidelines for healthy eating will help you control your intake of fat and cholesterol:  Check food labels carefully to identify foods with trans fats or high amounts of saturated fat.  Fill one half of your plate with vegetables and green salads.  Fill one fourth of your plate with whole grains. Look for the word "whole" as the first word in the ingredient list.  Fill one fourth of your plate with lean protein foods.  Limit fruit to two servings a day. Choose fruit instead of juice.  Eat more foods that contain fiber, such as apples, broccoli, carrots, beans, peas, and barley.  Eat more home-cooked food and less restaurant, buffet, and fast food.  Limit or avoid alcohol.  Limit foods high in starch and sugar.  Limit fried foods.  Cook foods using methods other than frying. Baking, boiling, grilling, and broiling are all great options.  Lose weight if you are overweight. Losing just 5-10% of your initial body weight can help your overall health and prevent diseases such as diabetes and heart disease.  What foods can I eat? Grains  Whole grains, such as whole wheat or whole grain breads, crackers, cereals, and pasta. Unsweetened oatmeal, bulgur, barley, quinoa, or brown rice. Corn or whole wheat flour tortillas. Vegetables  Fresh or frozen vegetables (raw, steamed, roasted, or grilled). Green salads. Fruits  All fresh, canned (in natural juice), or frozen fruits. Meats and other protein foods  Ground beef (85% or leaner), grass-fed beef, or beef trimmed of fat. Skinless chicken or turkey. Ground chicken or turkey.   Pork trimmed of fat. All fish and seafood. Eggs. Dried beans, peas, or lentils. Unsalted nuts or seeds. Unsalted canned or dry beans. Dairy  Low-fat dairy products, such as skim or 1% milk, 2% or reduced-fat cheeses, low-fat ricotta or cottage cheese, or plain low-fat yo Fats and oils  Tub margarines without trans  fats. Light or reduced-fat mayonnaise and salad dressings. Avocado. Olive, canola, sesame, or safflower oils. Natural peanut or almond butter (choose ones without added sugar and oil). The items listed above may not be a complete list of recommended foods or beverages. Contact your dietitian for more options. Foods to avoid Grains  White bread. White pasta. White rice. Cornbread. Bagels, pastries, and croissants. Crackers that contain trans fat. Vegetables  White potatoes. Corn. Creamed or fried vegetables. Vegetables in a cheese sauce. Fruits  Dried fruits. Canned fruit in light or heavy syrup. Fruit juice. Meats and other protein foods  Fatty cuts of meat. Ribs, chicken wings, bacon, sausage, bologna, salami, chitterlings, fatback, hot dogs, bratwurst, and packaged luncheon meats. Liver and organ meats. Dairy  Whole or 2% milk, cream, half-and-half, and cream cheese. Whole milk cheeses. Whole-fat or sweetened yogurt. Full-fat cheeses. Nondairy creamers and whipped toppings. Processed cheese, cheese spreads, or cheese curds. Beverages  Alcohol. Sweetened drinks (such as sodas, lemonade, and fruit drinks or punches). Fats and oils  Butter, stick margarine, lard, shortening, ghee, or bacon fat. Coconut, palm kernel, or palm oils. Sweets and desserts  Corn syrup, sugars, honey, and molasses. Candy. Jam and jelly. Syrup. Sweetened cereals. Cookies, pies, cakes, donuts, muffins, and ice cream. The items listed above may not be a complete list of foods and beverages to avoid. Contact your dietitian for more information. This information is not intended to replace advice given to you by your health care provider. Make sure you discuss any questions you have with your health care provider. Document Released: 06/26/2005 Document Revised: 07/17/2014 Document Reviewed: 09/24/2013 Elsevier Interactive Patient Education  2017 [employer]  

## 2016-06-19 ENCOUNTER — Other Ambulatory Visit: Payer: Self-pay | Admitting: Physician Assistant

## 2016-06-20 ENCOUNTER — Other Ambulatory Visit: Payer: Self-pay | Admitting: Physician Assistant

## 2016-07-20 ENCOUNTER — Other Ambulatory Visit: Payer: Self-pay | Admitting: Physician Assistant

## 2016-08-04 ENCOUNTER — Other Ambulatory Visit: Payer: Self-pay

## 2016-08-04 DIAGNOSIS — B182 Chronic viral hepatitis C: Secondary | ICD-10-CM

## 2016-08-22 ENCOUNTER — Encounter: Payer: Self-pay | Admitting: Gastroenterology

## 2016-08-22 ENCOUNTER — Ambulatory Visit (INDEPENDENT_AMBULATORY_CARE_PROVIDER_SITE_OTHER): Payer: Self-pay | Admitting: Gastroenterology

## 2016-08-22 VITALS — BP 127/80 | HR 82 | Temp 98.0°F | Ht 64.0 in | Wt 140.8 lb

## 2016-08-22 DIAGNOSIS — B182 Chronic viral hepatitis C: Secondary | ICD-10-CM

## 2016-08-22 DIAGNOSIS — D649 Anemia, unspecified: Secondary | ICD-10-CM

## 2016-08-22 NOTE — Progress Notes (Addendum)
REVIEWED. GIVENS CAPSULE TO COMPLETE ANEMIA EVALUATION. PT HAS HAD ANEMIA SINCE 2010. REFER TO HEMATOLOGY, DX: PERSISTENT ANEMIA.    Primary Care Physician: Soyla Dryer, PA-C  Primary Gastroenterologist:  Barney Drain, MD   Chief Complaint  Patient presents with  . H Pylori    f/u  . Hepatitis C    f/u  . Abdominal Pain    lower abd    HPI: Cynthia [name] is a 66 y.o. female here for f/u. Completed [facility] 12/2015. Due for 24 week SVR testing. H.pylori stool Ag negative in 02/2016. H/O F2/F3 on u/s with elastography. Overall has been doing well. Intermittent pain in left abdomen lasts for only couple minutes at a time. Infrequent. Not associated with meals or BMs or movement. No melena, brbpr, Constipation, diarrhea, nausea, vomiting, heartburn, weight loss.    Current Outpatient Prescriptions  Medication Sig Dispense Refill  . albuterol (PROVENTIL) (2.5 MG/3ML) 0.083% nebulizer solution Take 3 mLs (2.5 mg total) by nebulization every 6 (six) hours as needed for wheezing or shortness of breath. 150 mL 1  . amLODipine (NORVASC) 10 MG tablet Take 10 mg by mouth daily.    Marland Kitchen ascorbic acid (VITAMIN C) 500 MG tablet Take 500 mg by mouth daily.    . butalbital-acetaminophen-caffeine (FIORICET, ESGIC) 50-325-40 MG tablet TAKE 1 TO 2 TABLETS BY MOUTH EVERY 8 HOURS AS NEEDED FOR HEADACHE 20 tablet 0  . fenoprofen (NALFON) 600 MG TABS tablet Take 600 mg by mouth daily.    . Ferrous Sulfate (IRON) 325 (65 Fe) MG TABS Take 1 tablet by mouth daily.    Marland Kitchen FLUoxetine (PROZAC) 10 MG capsule Take 40 mg by mouth daily.     Marland Kitchen LORazepam (ATIVAN) 0.5 MG tablet Take 0.5 mg by mouth every 12 (twelve) hours as needed for anxiety.     . mirtazapine (REMERON) 15 MG tablet Take 15 mg by mouth at bedtime.    . mometasone-formoterol (DULERA) 100-5 MCG/ACT AERO Inhale 2 puffs into the lungs 2 (two) times daily. 3 Inhaler 2  . Multiple Vitamin (MULTIVITAMIN) tablet Take 1 tablet by mouth daily.    .  nitroGLYCERIN (NITROSTAT) 0.4 MG SL tablet Place 1 tablet (0.4 mg total) under the tongue every 5 (five) minutes as needed for chest pain. 25 tablet 6  . omeprazole (PRILOSEC) 20 MG capsule TAKE 1 CAPSULE BY MOUTH ONCE DAILY BEFORE A MEAL 60 capsule 3  . propranolol (INDERAL) 80 MG tablet Take 1 tablet (80 mg total) by mouth 2 (two) times daily. 180 tablet 3  . simvastatin (ZOCOR) 20 MG tablet TAKE 1 TABLET BY MOUTH EVERY NIGHT AT BEDTIME FOR CHOLESTEROL 30 tablet 4  . traZODone (DESYREL) 50 MG tablet Take 50-100 mg by mouth at bedtime as needed for sleep.     Marland Kitchen zinc gluconate 50 MG tablet Take 50 mg by mouth daily.    Marland Kitchen losartan (COZAAR) 50 MG tablet Take 1 tablet (50 mg total) by mouth daily. (Patient not taking: Reported on 08/22/2016) 90 tablet 1   No current facility-administered medications for this visit.     Allergies as of 08/22/2016 - Review Complete 08/22/2016  Allergen Reaction Noted  . Aspirin  11/11/2008    ROS:  General: Negative for anorexia, weight loss, fever, chills, fatigue, weakness. ENT: Negative for hoarseness, difficulty swallowing , nasal congestion. CV: Negative for chest pain, angina, palpitations, dyspnea on exertion, peripheral edema.  Respiratory: Negative for dyspnea at rest, dyspnea on exertion, cough, sputum, wheezing.  GI:  See history of present illness. GU:  Negative for dysuria, hematuria, urinary incontinence, urinary frequency, nocturnal urination.  Endo: Negative for unusual weight change.    Physical Examination:   BP 127/80   Pulse 82   Temp 98 F (36.7 C) (Oral)   Ht '5\' 4"'$  (1.626 m)   Wt 140 lb 12.8 oz (63.9 kg)   BMI 24.17 kg/m   General: Well-nourished, well-developed in no acute distress.  Eyes: No icterus. Mouth: Oropharyngeal mucosa moist and pink , no lesions erythema or exudate. Lungs: Clear to auscultation bilaterally.  Heart: Regular rate and rhythm, no murmurs rubs or gallops.  Abdomen: Bowel sounds are normal, nontender,  nondistended, no hepatosplenomegaly or masses, no abdominal bruits or hernia , no rebound or guarding.   Extremities: No lower extremity edema. No clubbing or deformities. Neuro: Alert and oriented x 4   Skin: Warm and dry, no jaundice.   Psych: Alert and cooperative, normal mood and affect.  Labs:  Lab Results  Component Value Date   ALT 12 04/19/2016   AST 15 04/19/2016   ALKPHOS 76 04/19/2016   BILITOT 0.3 04/19/2016   Lab Results  Component Value Date   CREATININE 0.84 04/19/2016   BUN 7 04/19/2016   NA 141 04/19/2016   K 3.9 04/19/2016   CL 106 04/19/2016   CO2 25 04/19/2016   Lab Results  Component Value Date   WBC 9.3 02/17/2016   HGB 11.1 (L) 02/17/2016   HCT 35.7 02/17/2016   MCV 87.7 02/17/2016   PLT 326 02/17/2016   H.pylori Ag negative 02/2016. Imaging Studies: No results found.

## 2016-08-22 NOTE — Assessment & Plan Note (Signed)
Due for HCV RNA quantitative test, 6 months posttreatment. Previously some F3 score on elastography. Guidelines state that F0, F1, F2 no further follow-up for liver disease needed but for F3, F4 ultrasound every 6 months for hepatoma screening required. At this time we will plan on repeating her u/s with elastography to help determine current F score and assist in determining if hepatoma screening will be needed. Patient in agreement. Patient requested  Patient requested nicotine patches, I deferred her to her PCP for this.

## 2016-08-22 NOTE — Progress Notes (Signed)
CC'ED TO PCP 

## 2016-08-22 NOTE — Assessment & Plan Note (Signed)
Normocytic anemia with hemoglobin 11. Recheck labs.

## 2016-08-22 NOTE — Patient Instructions (Signed)
1. Please have your labs and ultrasound done when you are feeling better. I HAVE INCLUDED ALL NECESSARY LABS ON [facility] SO YOU CAN DISREGARD THE LAB ORDERS WE MAILED TO YOU.

## 2016-08-23 ENCOUNTER — Other Ambulatory Visit: Payer: Self-pay | Admitting: Physician Assistant

## 2016-08-24 ENCOUNTER — Other Ambulatory Visit: Payer: Self-pay | Admitting: Physician Assistant

## 2016-08-24 DIAGNOSIS — B182 Chronic viral hepatitis C: Secondary | ICD-10-CM

## 2016-08-24 DIAGNOSIS — D649 Anemia, unspecified: Secondary | ICD-10-CM

## 2016-08-24 MED ORDER — BUTALBITAL-APAP-CAFFEINE 50-325-40 MG PO TABS: ORAL_TABLET | ORAL | 0 refills | Status: DC

## 2016-08-29 ENCOUNTER — Ambulatory Visit (HOSPITAL_COMMUNITY)
Admission: RE | Admit: 2016-08-29 | Discharge: 2016-08-29 | Disposition: A | Discharge status: Home / Self Care | Payer: Self-pay | Source: Ambulatory Visit | Attending: Gastroenterology | Admitting: Gastroenterology

## 2016-08-29 ENCOUNTER — Other Ambulatory Visit (HOSPITAL_COMMUNITY)
Admission: RE | Admit: 2016-08-29 | Discharge: 2016-08-29 | Disposition: A | Discharge status: Home / Self Care | Payer: Self-pay | Source: Ambulatory Visit | Attending: Physician Assistant | Admitting: Physician Assistant

## 2016-08-29 DIAGNOSIS — D649 Anemia, unspecified: Secondary | ICD-10-CM | POA: Insufficient documentation

## 2016-08-29 DIAGNOSIS — B182 Chronic viral hepatitis C: Secondary | ICD-10-CM | POA: Insufficient documentation

## 2016-08-29 LAB — IRON AND TIBC
Iron: 56 ug/dL (ref 28–170)
SATURATION RATIOS: 19 % (ref 10.4–31.8)
TIBC: 290 ug/dL (ref 250–450)
UIBC: 234 ug/dL

## 2016-08-29 LAB — FERRITIN: FERRITIN: 45 ng/mL (ref 11–307)

## 2016-08-29 LAB — CBC WITH DIFFERENTIAL/PLATELET
BASOS PCT: 0 %
Basophils Absolute: 0 10*3/uL (ref 0.0–0.1)
EOS ABS: 0.4 10*3/uL (ref 0.0–0.7)
EOS PCT: 4 %
HCT: 33.9 % — ABNORMAL LOW (ref 36.0–46.0)
Hemoglobin: 11.1 g/dL — ABNORMAL LOW (ref 12.0–15.0)
LYMPHS ABS: 2 10*3/uL (ref 0.7–4.0)
Lymphocytes Relative: 23 %
MCH: 27.5 pg (ref 26.0–34.0)
MCHC: 32.7 g/dL (ref 30.0–36.0)
MCV: 83.9 fL (ref 78.0–100.0)
Monocytes Absolute: 0.5 10*3/uL (ref 0.1–1.0)
Monocytes Relative: 6 %
Neutro Abs: 5.9 10*3/uL (ref 1.7–7.7)
Neutrophils Relative %: 67 %
PLATELETS: 267 10*3/uL (ref 150–400)
RBC: 4.04 MIL/uL (ref 3.87–5.11)
RDW: 16.9 % — ABNORMAL HIGH (ref 11.5–15.5)
WBC: 8.8 10*3/uL (ref 4.0–10.5)

## 2016-08-30 LAB — HCV RNA QUANT: HCV QUANT: NOT DETECTED [IU]/mL (ref >50)

## 2016-08-31 ENCOUNTER — Other Ambulatory Visit: Payer: Self-pay

## 2016-08-31 DIAGNOSIS — D649 Anemia, unspecified: Secondary | ICD-10-CM

## 2016-08-31 NOTE — Progress Notes (Signed)
Discussed findings with Roosevelt Locks, NP with [facility] who agreed with no need for hepatoma surveillance given low F scores.

## 2016-08-31 NOTE — Progress Notes (Signed)
Liver without cirrhosis. F scores much better. F0/F1. Would not recommend ongoing hepatoma surveillance based on this.

## 2016-08-31 NOTE — Progress Notes (Signed)
Please let patient know her Hgb is slightly low but stable for nearly one year. Her HCV is NEGATIVE at six months post treatment. This indicates sustained response.  Await u/s. Next labs 4 months, ferritin, cbc. Return to office in 4 months after labs to see [facility] only. [facility] has never seen patient in office.

## 2016-09-01 NOTE — Progress Notes (Signed)
PT is aware.

## 2016-09-01 NOTE — Progress Notes (Signed)
Pt is aware. Lab orders on file for June. Forwarding to Manuela Schwartz to nic for OV with Dr. Oneida Alar.

## 2016-09-07 ENCOUNTER — Ambulatory Visit: Payer: Self-pay | Admitting: Physician Assistant

## 2016-09-07 ENCOUNTER — Encounter: Payer: Self-pay | Admitting: Physician Assistant

## 2016-09-07 VITALS — BP 166/80 | HR 70 | Temp 97.5°F | Ht 64.0 in | Wt 146.0 lb

## 2016-09-07 DIAGNOSIS — G44229 Chronic tension-type headache, not intractable: Secondary | ICD-10-CM

## 2016-09-07 DIAGNOSIS — J449 Chronic obstructive pulmonary disease, unspecified: Secondary | ICD-10-CM

## 2016-09-07 DIAGNOSIS — F411 Generalized anxiety disorder: Secondary | ICD-10-CM

## 2016-09-07 DIAGNOSIS — I10 Essential (primary) hypertension: Secondary | ICD-10-CM

## 2016-09-07 DIAGNOSIS — K219 Gastro-esophageal reflux disease without esophagitis: Secondary | ICD-10-CM

## 2016-09-07 DIAGNOSIS — D649 Anemia, unspecified: Secondary | ICD-10-CM

## 2016-09-07 DIAGNOSIS — E785 Hyperlipidemia, unspecified: Secondary | ICD-10-CM

## 2016-09-07 LAB — CBC WITH DIFFERENTIAL/PLATELET
BASOS ABS: 0 {cells}/uL (ref 0–200)
BASOS PCT: 0 %
EOS ABS: 280 {cells}/uL (ref 15–500)
Eosinophils Relative: 4 %
HCT: 33.7 % — ABNORMAL LOW (ref 35.0–45.0)
HEMOGLOBIN: 10.5 g/dL — AB (ref 11.7–15.5)
LYMPHS ABS: 1890 {cells}/uL (ref 850–3900)
Lymphocytes Relative: 27 %
MCH: 26.7 pg — AB (ref 27.0–33.0)
MCHC: 31.2 g/dL — ABNORMAL LOW (ref 32.0–36.0)
MCV: 85.8 fL (ref 80.0–100.0)
MONOS PCT: 8 %
MPV: 9.9 fL (ref 7.5–12.5)
Monocytes Absolute: 560 cells/uL (ref 200–950)
Neutro Abs: 4270 cells/uL (ref 1500–7800)
Neutrophils Relative %: 61 %
PLATELETS: 266 10*3/uL (ref 140–400)
RBC: 3.93 MIL/uL (ref 3.80–5.10)
RDW: 17 % — ABNORMAL HIGH (ref 11.0–15.0)
WBC: 7 10*3/uL (ref 3.8–10.8)

## 2016-09-07 MED ORDER — LOSARTAN POTASSIUM 50 MG PO TABS: 50.0000 mg | ORAL_TABLET | Freq: Every day | ORAL | 1 refills | Status: DC

## 2016-09-07 MED ORDER — AMLODIPINE BESYLATE 10 MG PO TABS: 10.0000 mg | ORAL_TABLET | Freq: Every day | ORAL | 1 refills | Status: DC

## 2016-09-07 MED ORDER — AMLODIPINE BESYLATE 10 MG PO TABS: 10.0000 mg | ORAL_TABLET | Freq: Every day | ORAL | 3 refills | Status: DC

## 2016-09-07 MED ORDER — LOSARTAN POTASSIUM 50 MG PO TABS: 50.0000 mg | ORAL_TABLET | Freq: Every day | ORAL | 3 refills | Status: DC

## 2016-09-07 NOTE — Progress Notes (Signed)
BP (!) 166/80 (BP Location: Left Arm, Patient Position: Sitting, Cuff Size: Normal)   Pulse 70   Temp 97.5 F (36.4 C)   Ht '5\' 4"'$  (1.626 m)   Wt 146 lb (66.2 kg)   SpO2 98%   BMI 25.06 kg/m    Subjective:    Patient ID: Cynthia [name], female    DOB: 05-18-1951, 66 y.o.   MRN: [id number]  HPI: Cynthia [name] is a 66 y.o. female presenting on 09/07/2016 for Hypertension and Hyperlipidemia   HPI   Pt in process of moving to [place].  She says she Won't be moved until April  Pt still going to [facility].   Pt quit smoking about 2 wk ago.  Says she is feeling better.   Pt out of her amlodipine and losartan.   Relevant past medical, surgical, family and social history reviewed and updated as indicated. Interim medical history since our last visit reviewed. Allergies and medications reviewed and updated.   Current Outpatient Prescriptions:  .  albuterol (PROVENTIL) (2.5 MG/3ML) 0.083% nebulizer solution, Take 3 mLs (2.5 mg total) by nebulization every 6 (six) hours as needed for wheezing or shortness of breath., Disp: 150 mL, Rfl: 1 .  Ascorbic Acid (VITAMIN C PO), Take 1 tablet by mouth daily., Disp: , Rfl:  .  butalbital-acetaminophen-caffeine (FIORICET, ESGIC) 50-325-40 MG tablet, TAKE 1 TO 2 TABLETS BY MOUTH EVERY 8 HOURS AS NEEDED FOR HEADACHE, Disp: 20 tablet, Rfl: 0 .  CALCIUM-VITAMIN D PO, Take 1 tablet by mouth daily., Disp: , Rfl:  .  Ferrous Sulfate (IRON) 325 (65 Fe) MG TABS, Take 1 tablet by mouth daily., Disp: , Rfl:  .  FLUoxetine (PROZAC) 10 MG capsule, Take 40 mg by mouth daily. , Disp: , Rfl:  .  LORazepam (ATIVAN) 0.5 MG tablet, Take 0.5 mg by mouth every 12 (twelve) hours as needed for anxiety. , Disp: , Rfl:  .  mirtazapine (REMERON) 15 MG tablet, Take 15 mg by mouth at bedtime., Disp: , Rfl:  .  mometasone-formoterol (DULERA) 100-5 MCG/ACT AERO, Inhale 2 puffs into the lungs 2 (two) times daily., Disp: 3 Inhaler, Rfl: 2 .  Multiple Vitamin  (MULTIVITAMIN) tablet, Take 1 tablet by mouth daily., Disp: , Rfl:  .  NAPROXEN PO, Take 1 tablet by mouth as needed., Disp: , Rfl:  .  nitroGLYCERIN (NITROSTAT) 0.4 MG SL tablet, Place 1 tablet (0.4 mg total) under the tongue every 5 (five) minutes as needed for chest pain., Disp: 25 tablet, Rfl: 6 .  omeprazole (PRILOSEC) 20 MG capsule, TAKE 1 CAPSULE BY MOUTH ONCE DAILY BEFORE A MEAL, Disp: 60 capsule, Rfl: 3 .  propranolol (INDERAL) 80 MG tablet, Take 1 tablet (80 mg total) by mouth 2 (two) times daily., Disp: 180 tablet, Rfl: 3 .  simvastatin (ZOCOR) 20 MG tablet, TAKE 1 TABLET BY MOUTH EVERY NIGHT AT BEDTIME FOR CHOLESTEROL, Disp: 30 tablet, Rfl: 4 .  traZODone (DESYREL) 50 MG tablet, Take 75 mg by mouth at bedtime as needed for sleep. , Disp: , Rfl:  .  zinc gluconate 50 MG tablet, Take 50 mg by mouth daily., Disp: , Rfl:  .  amLODipine (NORVASC) 10 MG tablet, Take 10 mg by mouth daily., Disp: , Rfl:  .  ascorbic acid (VITAMIN C) 500 MG tablet, Take 500 mg by mouth daily., Disp: , Rfl:  .  fenoprofen (NALFON) 600 MG TABS tablet, Take 600 mg by mouth daily., Disp: , Rfl:  .  losartan (COZAAR) 50 MG tablet, Take 50 mg by mouth daily., Disp: , Rfl:    Review of Systems  Constitutional: Positive for diaphoresis and fatigue. Negative for appetite change, chills, fever and unexpected weight change.  HENT: Positive for sneezing. Negative for congestion, dental problem, drooling, ear pain, facial swelling, hearing loss, mouth sores, sore throat, trouble swallowing and voice change.   Eyes: Negative for pain, discharge, redness, itching and visual disturbance.  Respiratory: Positive for cough and shortness of breath. Negative for choking and wheezing.   Cardiovascular: Negative for chest pain, palpitations and leg swelling.  Gastrointestinal: Positive for abdominal pain. Negative for blood in stool, constipation, diarrhea and vomiting.  Endocrine: Positive for cold intolerance. Negative for heat  intolerance and polydipsia.  Genitourinary: Negative for decreased urine volume, dysuria and hematuria.  Musculoskeletal: Positive for arthralgias, back pain and gait problem.  Skin: Negative for rash.  Allergic/Immunologic: Negative for environmental allergies.  Neurological: Positive for headaches. Negative for seizures, syncope and light-headedness.  Hematological: Negative for adenopathy.  Psychiatric/Behavioral: Positive for agitation and dysphoric mood. Negative for suicidal ideas. The patient is nervous/anxious.     Per HPI unless specifically indicated above     Objective:    BP (!) 166/80 (BP Location: Left Arm, Patient Position: Sitting, Cuff Size: Normal)   Pulse 70   Temp 97.5 F (36.4 C)   Ht '5\' 4"'$  (1.626 m)   Wt 146 lb (66.2 kg)   SpO2 98%   BMI 25.06 kg/m   Wt Readings from Last 3 Encounters:  09/07/16 146 lb (66.2 kg)  08/22/16 140 lb 12.8 oz (63.9 kg)  06/06/16 144 lb (65.3 kg)    Physical Exam  Constitutional: She is oriented to person, place, and time. She appears well-developed and well-nourished.  HENT:  Head: Normocephalic and atraumatic.  Neck: Neck supple.  Cardiovascular: Normal rate and regular rhythm.   Pulmonary/Chest: Effort normal and breath sounds normal.  Abdominal: Soft. Bowel sounds are normal. She exhibits no mass. There is no hepatosplenomegaly. There is no tenderness.  Musculoskeletal: She exhibits no edema.  Lymphadenopathy:    She has no cervical adenopathy.  Neurological: She is alert and oriented to person, place, and time.  Skin: Skin is warm and dry.  Psychiatric: She has a normal mood and affect. Her behavior is normal.  Vitals reviewed.   Results for orders placed or performed during the hospital encounter of 08/29/16  HCV RNA quant  Result Value Ref Range   HCV Quantitative HCV Not Detected >50 IU/mL   Test Information Comment   CBC with Differential/Platelet  Result Value Ref Range   WBC 8.8 4.0 - 10.5 K/uL   RBC  4.04 3.87 - 5.11 MIL/uL   Hemoglobin 11.1 (L) 12.0 - 15.0 g/dL   HCT 33.9 (L) 36.0 - 46.0 %   MCV 83.9 78.0 - 100.0 fL   MCH 27.5 26.0 - 34.0 pg   MCHC 32.7 30.0 - 36.0 g/dL   RDW 16.9 (H) 11.5 - 15.5 %   Platelets 267 150 - 400 K/uL   Neutrophils Relative % 67 %   Neutro Abs 5.9 1.7 - 7.7 K/uL   Lymphocytes Relative 23 %   Lymphs Abs 2.0 0.7 - 4.0 K/uL   Monocytes Relative 6 %   Monocytes Absolute 0.5 0.1 - 1.0 K/uL   Eosinophils Relative 4 %   Eosinophils Absolute 0.4 0.0 - 0.7 K/uL   Basophils Relative 0 %   Basophils Absolute 0.0 0.0 - 0.1 K/uL  Ferritin  Result Value Ref Range   Ferritin 45 11 - 307 ng/mL  Iron and TIBC  Result Value Ref Range   Iron 56 28 - 170 ug/dL   TIBC 290 250 - 450 ug/dL   Saturation Ratios 19 10.4 - 31.8 %   UIBC 234 ug/dL      Assessment & Plan:   Encounter Diagnoses  Name Primary?  . Essential hypertension, benign Yes  . Hyperlipidemia, unspecified hyperlipidemia type   . Chronic obstructive pulmonary disease, unspecified COPD type ([place])   . Chronic tension-type headache, not intractable   . Gastroesophageal reflux disease, esophagitis presence not specified   . Anemia, unspecified type   . Anxiety state     -Pt told to notify [facility]ist when she gets moved -Pt counseled to get back on her meds. rx sent to local pharmacy until she can get from [facility] -continue current medications -follow up 3 months.  RTO sooner prn

## 2016-09-08 LAB — FERRITIN: Ferritin: 50 ng/mL (ref 20–288)

## 2016-09-08 LAB — IRON AND TIBC
%SAT: 49 % (ref 11–50)
IRON: 131 ug/dL (ref 45–160)
TIBC: 268 ug/dL (ref 250–450)
UIBC: 137 ug/dL (ref 125–400)

## 2016-09-11 LAB — HEPATITIS C RNA QUANTITATIVE
HCV Quantitative Log: <1.18 Log IU/mL
HCV Quantitative: <15 IU/mL

## 2016-09-18 ENCOUNTER — Other Ambulatory Visit: Payer: Self-pay | Admitting: Physician Assistant

## 2016-09-19 NOTE — Progress Notes (Signed)
Please let patient know her HCV is negative. No need to recheck unless she suspects exposure in the future. Avoid risky behaviors such has illicit drug use, sharing of needles/straws, etc.  She continues to have normocytic anemia but has basically been stable. In past has had some iron deficits. Suspect anemia of chronic disease.  Her last TCS/EGD was in 2016.  Let's check ifobt.

## 2016-09-20 ENCOUNTER — Other Ambulatory Visit: Payer: Self-pay | Admitting: Physician Assistant

## 2016-09-25 ENCOUNTER — Ambulatory Visit (INDEPENDENT_AMBULATORY_CARE_PROVIDER_SITE_OTHER): Payer: Self-pay | Admitting: Gastroenterology

## 2016-09-25 DIAGNOSIS — D649 Anemia, unspecified: Secondary | ICD-10-CM

## 2016-09-25 LAB — IFOBT (OCCULT BLOOD): IMMUNOLOGICAL FECAL OCCULT BLOOD TEST: POSITIVE

## 2016-09-27 NOTE — Progress Notes (Signed)
ifobt positive. See result note.

## 2016-09-28 ENCOUNTER — Other Ambulatory Visit: Payer: Self-pay | Admitting: Physician Assistant

## 2016-10-05 NOTE — Progress Notes (Signed)
LMOM to call.

## 2016-10-05 NOTE — Progress Notes (Signed)
Please let patient know that Dr. Oneida Alar recommends Given's Capsule study for decline in H/H and heme + stool.

## 2016-10-09 NOTE — Progress Notes (Signed)
PT is aware. OK to schedule the Given's Capsule Study.

## 2016-10-10 ENCOUNTER — Other Ambulatory Visit: Payer: Self-pay

## 2016-10-10 ENCOUNTER — Telehealth: Payer: Self-pay

## 2016-10-10 DIAGNOSIS — R195 Other fecal abnormalities: Secondary | ICD-10-CM

## 2016-10-10 DIAGNOSIS — D649 Anemia, unspecified: Secondary | ICD-10-CM

## 2016-10-10 NOTE — Telephone Encounter (Signed)
Opened in error

## 2016-10-19 ENCOUNTER — Encounter (HOSPITAL_COMMUNITY): Payer: Self-pay | Admitting: *Deleted

## 2016-10-19 ENCOUNTER — Ambulatory Visit (HOSPITAL_COMMUNITY)
Admission: RE | Admit: 2016-10-19 | Discharge: 2016-10-19 | Disposition: A | Discharge status: Home / Self Care | Payer: Self-pay | Source: Ambulatory Visit | Attending: Gastroenterology | Admitting: Gastroenterology

## 2016-10-19 ENCOUNTER — Encounter (HOSPITAL_COMMUNITY)
Admission: RE | Disposition: A | Discharge status: Home / Self Care | Payer: Self-pay | Source: Ambulatory Visit | Attending: Gastroenterology

## 2016-10-19 DIAGNOSIS — R195 Other fecal abnormalities: Secondary | ICD-10-CM | POA: Insufficient documentation

## 2016-10-19 DIAGNOSIS — K921 Melena: Secondary | ICD-10-CM

## 2016-10-19 DIAGNOSIS — D500 Iron deficiency anemia secondary to blood loss (chronic): Secondary | ICD-10-CM | POA: Insufficient documentation

## 2016-10-19 DIAGNOSIS — K5520 Angiodysplasia of colon without hemorrhage: Secondary | ICD-10-CM | POA: Insufficient documentation

## 2016-10-19 DIAGNOSIS — D649 Anemia, unspecified: Secondary | ICD-10-CM

## 2016-10-19 HISTORY — PX: GIVENS CAPSULE STUDY: SHX5432

## 2016-10-19 SURGERY — IMAGING PROCEDURE, GI TRACT, INTRALUMINAL, VIA CAPSULE

## 2016-10-19 MED ORDER — SODIUM CHLORIDE 0.9 % IV SOLN: INTRAVENOUS | Status: DC

## 2016-10-22 ENCOUNTER — Other Ambulatory Visit: Payer: Self-pay | Admitting: Physician Assistant

## 2016-10-23 ENCOUNTER — Other Ambulatory Visit: Payer: Self-pay | Admitting: Physician Assistant

## 2016-10-23 ENCOUNTER — Encounter (HOSPITAL_COMMUNITY): Payer: Self-pay | Admitting: Gastroenterology

## 2016-10-30 ENCOUNTER — Encounter (HOSPITAL_COMMUNITY): Payer: Self-pay | Admitting: Gastroenterology

## 2016-11-01 ENCOUNTER — Other Ambulatory Visit: Payer: Self-pay

## 2016-11-01 ENCOUNTER — Telehealth: Payer: Self-pay | Admitting: Gastroenterology

## 2016-11-01 DIAGNOSIS — K5520 Angiodysplasia of colon without hemorrhage: Secondary | ICD-10-CM

## 2016-11-01 NOTE — Telephone Encounter (Signed)
I Called patient YESTERDAY TO DISCUSS RESULTS. SHE WAS DRIVING AND ASKED ME TO CALL BACK IN 30 MINS.I Called patient TO DISCUSS RESULTS. AWARE OF AVMS(RED SPOTS) IN HER SMALL BOWEL. CORRECTLY MOVING SO SHE DID NOT HAVE A LOT OF TIME TO TALK. 1. SPOKE WITH PT REGARDING RESULTS. NO MELENA CURRENTLY. BLEEDING PRECAUTIONS GIVEN. PT VOICED HER UNDERSTANDING.  Plan: CONTINUE IRON PO. MAY NEED IVFE. CBC NEXT WEEK AND EVERY 3 MOS. CONSIDER REFERRAL FOR BALLOON ENTEROSCOPY OR ANGIOGRAPHY/EMBOLIZATION IF ACTIVE BLEEDING SEEN

## 2016-11-01 NOTE — Procedures (Signed)
PATIENT DATA: GASTRIC PASSAGE TIME: 10 m, SB PASSAGE TIME: 3H 39m RESULTS: LIMITED views of gastric mucosa due to retained contents. FREQUENT AVMS SEEN STARTING AT 1:11.  OLD BLOOD/FEW CLOTS AND  AVM SEEN AT 2:40. DISTAL SMALL BOWEL OCCASIONALLY OBSCURED BY BLACK LIQUID IN LUMEN.  LIMITED VIEWS OF THE COLON DUE TO RETAINED CONTENTS. SMALL AMOUNT OF MELENA MIXED WITH GREEN/BROWN STOOL. OTHERWISE No OLD blood or fresh blood in the stomach. NO FRESH BLOOD/ACTIVE BLEEDING SEEN IN colon.  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 SPOKE WITH PT REGARDING RESULTS. NO MELENA CURRENTLY. 2. CONTINUE IRON PO. MAY NEED IVFE. 3. CBC NEXT WEEK AND EVERY 3 MOS. 4. CONSIDER REFERRAL FOR BALLOON ENTEROSCOPY OR ANGIOGRAPHY/EMBOLIZATION IF ACTIVE BLEEDING SEEN

## 2016-11-01 NOTE — Telephone Encounter (Signed)
Pt is aware to continue iron and go to the lab next week. She said she will pick up orders here to take with her to the clinic.  I am leaving them at front desk for pick up.

## 2016-11-02 NOTE — Telephone Encounter (Signed)
Noted  

## 2016-11-02 NOTE — Telephone Encounter (Signed)
Consider referral for balloon enteroscopy or angiography/embolization if active bleeding seen per [facility]

## 2016-11-06 ENCOUNTER — Other Ambulatory Visit (HOSPITAL_COMMUNITY)
Admission: RE | Admit: 2016-11-06 | Discharge: 2016-11-06 | Disposition: A | Discharge status: Home / Self Care | Payer: Self-pay | Source: Ambulatory Visit | Attending: Physician Assistant | Admitting: Physician Assistant

## 2016-11-06 LAB — CBC WITH DIFFERENTIAL/PLATELET
BASOS ABS: 0 10*3/uL (ref 0.0–0.1)
BASOS PCT: 0 %
Eosinophils Absolute: 0.4 10*3/uL (ref 0.0–0.7)
Eosinophils Relative: 5 %
HEMATOCRIT: 32.1 % — AB (ref 36.0–46.0)
HEMOGLOBIN: 10.5 g/dL — AB (ref 12.0–15.0)
LYMPHS PCT: 26 %
Lymphs Abs: 1.8 10*3/uL (ref 0.7–4.0)
MCH: 28 pg (ref 26.0–34.0)
MCHC: 32.7 g/dL (ref 30.0–36.0)
MCV: 85.6 fL (ref 78.0–100.0)
MONOS PCT: 6 %
Monocytes Absolute: 0.4 10*3/uL (ref 0.1–1.0)
NEUTROS ABS: 4.6 10*3/uL (ref 1.7–7.7)
NEUTROS PCT: 63 %
Platelets: 315 10*3/uL (ref 150–400)
RBC: 3.75 MIL/uL — ABNORMAL LOW (ref 3.87–5.11)
RDW: 16.5 % — ABNORMAL HIGH (ref 11.5–15.5)
WBC: 7.2 10*3/uL (ref 4.0–10.5)

## 2016-11-08 ENCOUNTER — Encounter: Payer: Self-pay | Admitting: Gastroenterology

## 2016-11-08 ENCOUNTER — Other Ambulatory Visit: Payer: Self-pay | Admitting: Physician Assistant

## 2016-11-18 ENCOUNTER — Telehealth: Payer: Self-pay | Admitting: Gastroenterology

## 2016-11-18 NOTE — Telephone Encounter (Signed)
PLEASE CALL PT. HER BLOOD COUNT IS STABLE AT 10.5.

## 2016-11-20 NOTE — Telephone Encounter (Signed)
Pt is aware.  

## 2016-11-21 ENCOUNTER — Other Ambulatory Visit: Payer: Self-pay | Admitting: Physician Assistant

## 2016-11-27 ENCOUNTER — Other Ambulatory Visit: Payer: Self-pay

## 2016-11-27 DIAGNOSIS — D649 Anemia, unspecified: Secondary | ICD-10-CM

## 2016-12-05 ENCOUNTER — Other Ambulatory Visit (HOSPITAL_COMMUNITY)
Admission: RE | Admit: 2016-12-05 | Discharge: 2016-12-05 | Disposition: A | Discharge status: Home / Self Care | Payer: Self-pay | Source: Ambulatory Visit | Attending: Physician Assistant | Admitting: Physician Assistant

## 2016-12-05 LAB — COMPREHENSIVE METABOLIC PANEL
ALT: 14 U/L (ref 14–54)
ANION GAP: 7 (ref 5–15)
AST: 17 U/L (ref 15–41)
Albumin: 3.8 g/dL (ref 3.5–5.0)
Alkaline Phosphatase: 68 U/L (ref 38–126)
BILIRUBIN TOTAL: 0.4 mg/dL (ref 0.3–1.2)
BUN: 12 mg/dL (ref 6–20)
CHLORIDE: 107 mmol/L (ref 101–111)
CO2: 26 mmol/L (ref 22–32)
Calcium: 9.3 mg/dL (ref 8.9–10.3)
Creatinine, Ser: 0.94 mg/dL (ref 0.44–1.00)
GFR calc Af Amer: >60 mL/min (ref >60)
Glucose, Bld: 155 mg/dL — ABNORMAL HIGH (ref 65–99)
POTASSIUM: 3.9 mmol/L (ref 3.5–5.1)
Sodium: 140 mmol/L (ref 135–145)
TOTAL PROTEIN: 7.5 g/dL (ref 6.5–8.1)

## 2016-12-05 LAB — LIPID PANEL
CHOL/HDL RATIO: 4.1 ratio
CHOLESTEROL: 240 mg/dL — AB (ref 0–200)
HDL: 59 mg/dL (ref >40)
LDL Cholesterol: 163 mg/dL — ABNORMAL HIGH (ref 0–99)
TRIGLYCERIDES: 90 mg/dL (ref <150)
VLDL: 18 mg/dL (ref 0–40)

## 2016-12-06 ENCOUNTER — Other Ambulatory Visit (HOSPITAL_COMMUNITY)
Admission: RE | Admit: 2016-12-06 | Discharge: 2016-12-06 | Disposition: A | Discharge status: Home / Self Care | Payer: Self-pay | Source: Ambulatory Visit | Attending: Physician Assistant | Admitting: Physician Assistant

## 2016-12-06 LAB — CBC WITH DIFFERENTIAL/PLATELET
BASOS ABS: 0 10*3/uL (ref 0.0–0.1)
BASOS PCT: 0 %
EOS ABS: 0.4 10*3/uL (ref 0.0–0.7)
Eosinophils Relative: 5 %
HCT: 34.4 % — ABNORMAL LOW (ref 36.0–46.0)
Hemoglobin: 11 g/dL — ABNORMAL LOW (ref 12.0–15.0)
Lymphocytes Relative: 29 %
Lymphs Abs: 2.1 10*3/uL (ref 0.7–4.0)
MCH: 27.4 pg (ref 26.0–34.0)
MCHC: 32 g/dL (ref 30.0–36.0)
MCV: 85.8 fL (ref 78.0–100.0)
MONO ABS: 0.5 10*3/uL (ref 0.1–1.0)
Monocytes Relative: 6 %
Neutro Abs: 4.3 10*3/uL (ref 1.7–7.7)
Neutrophils Relative %: 60 %
PLATELETS: 333 10*3/uL (ref 150–400)
RBC: 4.01 MIL/uL (ref 3.87–5.11)
RDW: 16.4 % — AB (ref 11.5–15.5)
WBC: 7.3 10*3/uL (ref 4.0–10.5)

## 2016-12-06 LAB — FERRITIN: FERRITIN: 28 ng/mL (ref 11–307)

## 2016-12-11 NOTE — Progress Notes (Signed)
Please let patient know her ferritin and H/H are stable.   Recheck CBC, ferritin in 3 months. Dx: h/o ida, occult gi bleeding

## 2016-12-12 NOTE — Progress Notes (Signed)
LMOM to call.

## 2016-12-13 ENCOUNTER — Ambulatory Visit: Payer: Self-pay | Admitting: Physician Assistant

## 2016-12-13 ENCOUNTER — Other Ambulatory Visit: Payer: Self-pay

## 2016-12-13 DIAGNOSIS — D649 Anemia, unspecified: Secondary | ICD-10-CM

## 2016-12-13 NOTE — Progress Notes (Signed)
Pt is aware and lab orders on file for 3 months.

## 2016-12-19 ENCOUNTER — Other Ambulatory Visit: Payer: Self-pay | Admitting: Physician Assistant

## 2016-12-19 ENCOUNTER — Encounter: Payer: Self-pay | Admitting: Physician Assistant

## 2016-12-19 ENCOUNTER — Ambulatory Visit: Payer: Self-pay | Admitting: Physician Assistant

## 2016-12-19 VITALS — BP 126/80 | HR 76 | Temp 96.3°F | Ht 64.0 in | Wt 145.5 lb

## 2016-12-19 DIAGNOSIS — I10 Essential (primary) hypertension: Secondary | ICD-10-CM

## 2016-12-19 DIAGNOSIS — E785 Hyperlipidemia, unspecified: Secondary | ICD-10-CM

## 2016-12-19 DIAGNOSIS — J449 Chronic obstructive pulmonary disease, unspecified: Secondary | ICD-10-CM

## 2016-12-19 DIAGNOSIS — D649 Anemia, unspecified: Secondary | ICD-10-CM

## 2016-12-19 DIAGNOSIS — G44229 Chronic tension-type headache, not intractable: Secondary | ICD-10-CM

## 2016-12-19 MED ORDER — BUTALBITAL-APAP-CAFF-COD 50-325-40-30 MG PO CAPS: ORAL_CAPSULE | ORAL | 0 refills | Status: DC

## 2016-12-19 NOTE — Progress Notes (Signed)
BP 126/80 (BP Location: Right Arm, Patient Position: Sitting, Cuff Size: Normal)   Pulse 76   Temp (!) 96.3 F (35.7 C)   Ht 5\' 4"  (1.626 m)   Wt 145 lb 8 oz (66 kg)   SpO2 95%   BMI 24.98 kg/m    Subjective:    Patient ID: Cynthia [name], female    DOB: 16-Jul-1950, 66 y.o.   MRN: [id number]  HPI: Cynthia [name] is a 66 y.o. female presenting on 12/19/2016 for Hypertension and COPD   HPI   Pt is still off cigarettes!  Pt states breathing is getting better   Pt requests refill of butalbital early b/c she is going out of town to see her niece graduation in [place].   Pt has not been watching lowfat diet lately  Relevant past medical, surgical, family and social history reviewed and updated as indicated. Interim medical history since our last visit reviewed. Allergies and medications reviewed and updated.   Current Outpatient Prescriptions:  .  albuterol (PROVENTIL) (2.5 MG/3ML) 0.083% nebulizer solution, INHALE 1 VIAL VIA NEBULIZER EVERY 6 HOURS AS NEEDED FOR WHEEZING AND SHORTNESS OF BREATH, Disp: 150 mL, Rfl: 1 .  amLODipine (NORVASC) 10 MG tablet, Take 1 tablet (10 mg total) by mouth daily., Disp: 90 tablet, Rfl: 3 .  Ascorbic Acid (VITAMIN C PO), Take 1 tablet by mouth daily., Disp: , Rfl:  .  butalbital-acetaminophen-caffeine (FIORICET WITH CODEINE) 50-325-40-30 MG capsule, TAKE 1 TO 2 CAPSULES BY MOUTH EVERY 8 HOURS AS NEEDED FOR HEADACHE, Disp: 20 capsule, Rfl: 0 .  CALCIUM-VITAMIN D PO, Take 1 tablet by mouth daily., Disp: , Rfl:  .  Ferrous Sulfate (IRON) 325 (65 Fe) MG TABS, Take 1 tablet by mouth daily., Disp: , Rfl:  .  FLUoxetine (PROZAC) 10 MG capsule, Take 40 mg by mouth daily. , Disp: , Rfl:  .  LORazepam (ATIVAN) 0.5 MG tablet, Take 0.5 mg by mouth every 12 (twelve) hours as needed for anxiety. , Disp: , Rfl:  .  losartan (COZAAR) 50 MG tablet, Take 1 tablet (50 mg total) by mouth daily., Disp: 90 tablet, Rfl: 3 .  mirtazapine (REMERON) 15 MG  tablet, Take 15 mg by mouth at bedtime., Disp: , Rfl:  .  mometasone-formoterol (DULERA) 100-5 MCG/ACT AERO, Inhale 2 puffs into the lungs 2 (two) times daily., Disp: 3 Inhaler, Rfl: 2 .  Multiple Vitamin (MULTIVITAMIN) tablet, Take 1 tablet by mouth daily., Disp: , Rfl:  .  NAPROXEN PO, Take 1 tablet by mouth as needed., Disp: , Rfl:  .  nitroGLYCERIN (NITROSTAT) 0.4 MG SL tablet, Place 1 tablet (0.4 mg total) under the tongue every 5 (five) minutes as needed for chest pain., Disp: 25 tablet, Rfl: 6 .  omeprazole (PRILOSEC) 20 MG capsule, TAKE 1 Capsule BY MOUTH ONCE DAILY, Disp: 90 capsule, Rfl: 2 .  propranolol (INDERAL) 80 MG tablet, TAKE 1 TABLET BY MOUTH TWICE A DAY, Disp: 180 tablet, Rfl: 2 .  simvastatin (ZOCOR) 20 MG tablet, TAKE 1 TABLET BY MOUTH EVERY NIGHT AT BEDTIME FOR CHOLESTEROL, Disp: 30 tablet, Rfl: 4 .  traZODone (DESYREL) 50 MG tablet, Take 75 mg by mouth at bedtime as needed for sleep. , Disp: , Rfl:  .  zinc gluconate 50 MG tablet, Take 50 mg by mouth daily., Disp: , Rfl:  .  fenoprofen (NALFON) 600 MG TABS tablet, Take 600 mg by mouth daily., Disp: , Rfl:    Review of Systems  Constitutional:  Positive for diaphoresis. Negative for appetite change, chills, fatigue, fever and unexpected weight change.  HENT: Positive for sneezing. Negative for congestion, drooling, ear pain, facial swelling, hearing loss, mouth sores, sore throat, trouble swallowing and voice change.   Eyes: Negative for pain, discharge, redness, itching and visual disturbance.  Respiratory: Positive for cough and wheezing. Negative for choking and shortness of breath.   Cardiovascular: Positive for leg swelling. Negative for chest pain and palpitations.  Gastrointestinal: Positive for abdominal pain. Negative for blood in stool, constipation, diarrhea and vomiting.  Endocrine: Negative for cold intolerance, heat intolerance and polydipsia.  Genitourinary: Negative for decreased urine volume, dysuria and  hematuria.  Musculoskeletal: Positive for arthralgias and back pain. Negative for gait problem.  Skin: Negative for rash.  Allergic/Immunologic: Negative for environmental allergies.  Neurological: Positive for headaches. Negative for seizures, syncope and light-headedness.  Hematological: Negative for adenopathy.  Psychiatric/Behavioral: Positive for dysphoric mood. Negative for agitation and suicidal ideas. The patient is not nervous/anxious.     Per HPI unless specifically indicated above     Objective:    BP 126/80 (BP Location: Right Arm, Patient Position: Sitting, Cuff Size: Normal)   Pulse 76   Temp (!) 96.3 F (35.7 C)   Ht 5\' 4"  (1.626 m)   Wt 145 lb 8 oz (66 kg)   SpO2 95%   BMI 24.98 kg/m   Wt Readings from Last 3 Encounters:  12/19/16 145 lb 8 oz (66 kg)  10/19/16 142 lb (64.4 kg)  09/07/16 146 lb (66.2 kg)    Physical Exam  Constitutional: She is oriented to person, place, and time. She appears well-developed and well-nourished.  HENT:  Head: Normocephalic and atraumatic.  Neck: Neck supple.  Cardiovascular: Normal rate and regular rhythm.   Pulmonary/Chest: Effort normal and breath sounds normal.  Abdominal: Soft. Bowel sounds are normal. She exhibits no mass. There is no hepatosplenomegaly. There is no tenderness.  Musculoskeletal: She exhibits no edema.  Lymphadenopathy:    She has no cervical adenopathy.  Neurological: She is alert and oriented to person, place, and time.  Skin: Skin is warm and dry.  Psychiatric: She has a normal mood and affect. Her behavior is normal.  Vitals reviewed.       Assessment & Plan:   Encounter Diagnoses  Name Primary?  . Essential hypertension, benign Yes  . Hyperlipidemia, unspecified hyperlipidemia type   . Chronic obstructive pulmonary disease, unspecified COPD type ([place])   . Chronic tension-type headache, not intractable   . Anemia, unspecified type     -reviewed labs with pt -counseled pt to follow lowfat  diet. Gave handout -no changes to medications today -follow up 3 months. RTO sooner prn

## 2016-12-19 NOTE — Patient Instructions (Signed)
Fat and Cholesterol Restricted Diet High levels of fat and cholesterol in your blood may lead to various health problems, such as diseases of the heart, blood vessels, gallbladder, liver, and pancreas. Fats are concentrated sources of energy that come in various forms. Certain types of fat, including saturated fat, may be harmful in excess. Cholesterol is a substance needed by your body in small amounts. Your body makes all the cholesterol it needs. Excess cholesterol comes from the food you eat. When you have high levels of cholesterol and saturated fat in your blood, health problems can develop because the excess fat and cholesterol will gather along the walls of your blood vessels, causing them to narrow. Choosing the right foods will help you control your intake of fat and cholesterol. This will help keep the levels of these substances in your blood within normal limits and reduce your risk of disease. What is my plan? Your health care provider recommends that you:  Limit your fat intake to ______% or less of your total calories per day.  Limit the amount of cholesterol in your diet to less than _________mg per day.  Eat 20-30 grams of fiber each day.  What types of fat should I choose?  Choose healthy fats more often. Choose monounsaturated and polyunsaturated fats, such as olive and canola oil, flaxseeds, walnuts, almonds, and seeds.  Eat more omega-3 fats. Good choices include salmon, mackerel, sardines, tuna, flaxseed oil, and ground flaxseeds. Aim to eat fish at least two times a week.  Limit saturated fats. Saturated fats are primarily found in animal products, such as meats, butter, and cream. Plant sources of saturated fats include palm oil, palm kernel oil, and coconut oil.  Avoid foods with partially hydrogenated oils in them. These contain trans fats. Examples of foods that contain trans fats are stick margarine, some tub margarines, cookies, crackers, and other baked goods. What  general guidelines do I need to follow? These guidelines for healthy eating will help you control your intake of fat and cholesterol:  Check food labels carefully to identify foods with trans fats or high amounts of saturated fat.  Fill one half of your plate with vegetables and green salads.  Fill one fourth of your plate with whole grains. Look for the word "whole" as the first word in the ingredient list.  Fill one fourth of your plate with lean protein foods.  Limit fruit to two servings a day. Choose fruit instead of juice.  Eat more foods that contain fiber, such as apples, broccoli, carrots, beans, peas, and barley.  Eat more home-cooked food and less restaurant, buffet, and fast food.  Limit or avoid alcohol.  Limit foods high in starch and sugar.  Limit fried foods.  Cook foods using methods other than frying. Baking, boiling, grilling, and broiling are all great options.  Lose weight if you are overweight. Losing just 5-10% of your initial body weight can help your overall health and prevent diseases such as diabetes and heart disease.  What foods can I eat? Grains  Whole grains, such as whole wheat or whole grain breads, crackers, cereals, and pasta. Unsweetened oatmeal, bulgur, barley, quinoa, or brown rice. Corn or whole wheat flour tortillas. Vegetables  Fresh or frozen vegetables (raw, steamed, roasted, or grilled). Green salads. Fruits  All fresh, canned (in natural juice), or frozen fruits. Meats and other protein foods  Ground beef (85% or leaner), grass-fed beef, or beef trimmed of fat. Skinless chicken or turkey. Ground chicken or turkey.   Pork trimmed of fat. All fish and seafood. Eggs. Dried beans, peas, or lentils. Unsalted nuts or seeds. Unsalted canned or dry beans. Dairy  Low-fat dairy products, such as skim or 1% milk, 2% or reduced-fat cheeses, low-fat ricotta or cottage cheese, or plain low-fat yo Fats and oils  Tub margarines without trans  fats. Light or reduced-fat mayonnaise and salad dressings. Avocado. Olive, canola, sesame, or safflower oils. Natural peanut or almond butter (choose ones without added sugar and oil). The items listed above may not be a complete list of recommended foods or beverages. Contact your dietitian for more options. Foods to avoid Grains  White bread. White pasta. White rice. Cornbread. Bagels, pastries, and croissants. Crackers that contain trans fat. Vegetables  White potatoes. Corn. Creamed or fried vegetables. Vegetables in a cheese sauce. Fruits  Dried fruits. Canned fruit in light or heavy syrup. Fruit juice. Meats and other protein foods  Fatty cuts of meat. Ribs, chicken wings, bacon, sausage, bologna, salami, chitterlings, fatback, hot dogs, bratwurst, and packaged luncheon meats. Liver and organ meats. Dairy  Whole or 2% milk, cream, half-and-half, and cream cheese. Whole milk cheeses. Whole-fat or sweetened yogurt. Full-fat cheeses. Nondairy creamers and whipped toppings. Processed cheese, cheese spreads, or cheese curds. Beverages  Alcohol. Sweetened drinks (such as sodas, lemonade, and fruit drinks or punches). Fats and oils  Butter, stick margarine, lard, shortening, ghee, or bacon fat. Coconut, palm kernel, or palm oils. Sweets and desserts  Corn syrup, sugars, honey, and molasses. Candy. Jam and jelly. Syrup. Sweetened cereals. Cookies, pies, cakes, donuts, muffins, and ice cream. The items listed above may not be a complete list of foods and beverages to avoid. Contact your dietitian for more information. This information is not intended to replace advice given to you by your health care provider. Make sure you discuss any questions you have with your health care provider. Document Released: 06/26/2005 Document Revised: 07/17/2014 Document Reviewed: 09/24/2013 Elsevier Interactive Patient Education  2017 [employer]  

## 2016-12-22 ENCOUNTER — Other Ambulatory Visit: Payer: Self-pay | Admitting: Physician Assistant

## 2017-01-02 ENCOUNTER — Other Ambulatory Visit: Payer: Self-pay | Admitting: Physician Assistant

## 2017-01-02 MED ORDER — LOSARTAN POTASSIUM 50 MG PO TABS: 50.0000 mg | ORAL_TABLET | Freq: Every day | ORAL | 3 refills | Status: DC

## 2017-01-02 MED ORDER — OMEPRAZOLE 20 MG PO CPDR: 20.0000 mg | DELAYED_RELEASE_CAPSULE | Freq: Every day | ORAL | 2 refills | Status: DC

## 2017-01-18 ENCOUNTER — Other Ambulatory Visit: Payer: Self-pay | Admitting: Physician Assistant

## 2017-01-25 ENCOUNTER — Ambulatory Visit (INDEPENDENT_AMBULATORY_CARE_PROVIDER_SITE_OTHER): Payer: Self-pay | Admitting: Gastroenterology

## 2017-01-25 ENCOUNTER — Other Ambulatory Visit: Payer: Self-pay

## 2017-01-25 ENCOUNTER — Encounter: Payer: Self-pay | Admitting: Gastroenterology

## 2017-01-25 DIAGNOSIS — K219 Gastro-esophageal reflux disease without esophagitis: Secondary | ICD-10-CM

## 2017-01-25 DIAGNOSIS — R1314 Dysphagia, pharyngoesophageal phase: Secondary | ICD-10-CM

## 2017-01-25 DIAGNOSIS — B9681 Helicobacter pylori [H. pylori] as the cause of diseases classified elsewhere: Secondary | ICD-10-CM

## 2017-01-25 DIAGNOSIS — I70219 Atherosclerosis of native arteries of extremities with intermittent claudication, unspecified extremity: Secondary | ICD-10-CM

## 2017-01-25 DIAGNOSIS — K5520 Angiodysplasia of colon without hemorrhage: Secondary | ICD-10-CM

## 2017-01-25 DIAGNOSIS — B182 Chronic viral hepatitis C: Secondary | ICD-10-CM

## 2017-01-25 DIAGNOSIS — Q2733 Arteriovenous malformation of digestive system vessel: Secondary | ICD-10-CM

## 2017-01-25 DIAGNOSIS — K2970 Gastritis, unspecified, without bleeding: Secondary | ICD-10-CM

## 2017-01-25 NOTE — Assessment & Plan Note (Signed)
NO BRBPR OR MELENA.  CBC IN LATE AUG/EARLY SEP 2018.

## 2017-01-25 NOTE — Assessment & Plan Note (Signed)
ERADICATION CONFIRMED 2017.

## 2017-01-25 NOTE — Assessment & Plan Note (Signed)
COMPLETE VIROLOGIC RESPONSE.

## 2017-01-25 NOTE — Assessment & Plan Note (Signed)
SYMPTOMS CONTROLLED/RESOLVED.  CONTINUE OMEPRAZOLE.  TAKE 30 MINUTES PRIOR TO YOUR FIRST MEAL.

## 2017-01-25 NOTE — Progress Notes (Addendum)
Subjective:    Patient ID: Cynthia [name], female    DOB: June 13, 1951, 66 y.o.   MRN: [id number]  Soyla Dryer, PA-C  HPI HURTING IN BOTTOM OF ABDOMEN OFF AND ON FOR ABOUT A WEEK. ABOUT THE SAME. FEELS CRAMPY. HAPPENED BEFORE BUT NO DIAGNOSIS. NO RADIATION. BMs: MAYBE ONCE A DAY AND MAY BE 1-2 DAYS. LAST BM THIS AM. THINKS GOT IT ALL OUT. HAD TO TAKE PRUNE JUICE TO MAKE IT MOVE(TUES). PAIN BETTER AFTER A BM. RARE HEARTBURN: 1-2X/WEEK. FEELS SHARP PAIN IN CHEST: NO SMOKING, QUIT 2 MOS AGO. MAY FEEL SOB: ABOUT THE SAME. LEGS MAY GIVE OUT ON HER A LOT. GETS PAIIN IN BUT AND BACK OF THIGHS WHEN SHE WALKS. FELL AND HITHER BOTTOM ON MON. WEIGHT LOSS: NO. APPETITE: GOOD.  PT DENIES FEVER, CHILLS, HEMATOCHEZIA, HEMATEMESIS, nausea, vomiting, melena, diarrhea, CHANGE IN BOWEL IN HABITS, problems swallowing, problems with sedation, OR heartburn or indigestion.  Past Medical History:  Diagnosis Date  . Angina pectoris ([place])   . Anxiety   . Arthritis   . Bronchitis   . Bronchitis   . CHF (congestive heart failure) ([place])   . COPD (chronic obstructive pulmonary disease) ([place])   . Depression   . GERD (gastroesophageal reflux disease)   . HCV (hepatitis C virus)   . High blood cholesterol level   . Hypertension   . Migraine   . Migraines   . Pneumonia   . PTSD (post-traumatic stress disorder)    Past Surgical History:  Procedure Laterality Date  . BIOPSY  02/09/2015   Procedure: GASTRIC BIOPSIES ;  Surgeon: Danie Binder, MD;  Location: [facility];  Service: Endoscopy;;  . COLONOSCOPY WITH PROPOFOL N/A 02/09/2015   SLF: 1. the examined terminal ileum appeared to be normal 2. the colonic mucosa appeared normal 3. small internal hemorrhoids  . ESOPHAGOGASTRODUODENOSCOPY (EGD) WITH PROPOFOL N/A 02/09/2015   SLF: 1. stricture at the gastroesophageal junction 2. mild non-erosive gastritis   . GIVENS CAPSULE STUDY N/A 10/19/2016   Procedure: GIVENS CAPSULE STUDY;  Surgeon: Danie Binder, MD;   Location: [facility] SUITE;  Service: Endoscopy;  Laterality: N/A;  7:30am, pt to arrive at 7:00am  . left arm     ? nerve repaired, " it was pinched".  . left elbow surgery Left   . SAVORY DILATION N/A 02/09/2015   Procedure: SAVORY DILATION 12.80mm, 8mm, 60mm, 20mm;  Surgeon: Danie Binder, MD;  Location: [facility];  Service: Endoscopy;  Laterality: N/A;  . TONSILLECTOMY      Allergies  Allergen Reactions  . Aspirin     REACTION: Stomach upset   Current Outpatient Prescriptions  Medication Sig Dispense Refill  . albuterol (PROVENTIL) (2.5 MG/3ML) 0.083% nebulizer solution INHALE 1 VIAL VIA NEBULIZER EVERY 6 HOURS AS NEEDED FOR WHEEZING AND SHORTNESS OF BREATH 150 mL 1  . amLODipine (NORVASC) 10 MG tablet Take 1 tablet (10 mg total) by mouth daily. 90 tablet 3  . Ascorbic Acid (VITAMIN C PO) Take 1 tablet by mouth daily.    . butalbital-acetaminophen-caffeine (FIORICET WITH CODEINE) 50-325-40-30 MG capsule TAKE 1 TO 2 CAPSULES BY MOUTH EVERY 8 HOURS AS NEEDED FOR HEADACHE 20 capsule 0  . CALCIUM-VITAMIN D PO Take 1 tablet by mouth daily.    . DULERA 100-5 MCG/ACT AERO INHALE 2 PUFFS BY MOUTH TWICE DAILY. RINSE MOUTH AFTER EACH USE 39 g 2  . fenoprofen (NALFON) 600 MG TABS tablet Take 600 mg by mouth daily.    Marland Kitchen  Ferrous Sulfate (IRON) 325 (65 Fe) MG TABS Take 1 tablet by mouth daily.    Marland Kitchen FLUoxetine (PROZAC) 10 MG capsule Take 40 mg by mouth daily.     Marland Kitchen LORazepam (ATIVAN) 0.5 MG tablet Take 0.5 mg by mouth every 12 (twelve) hours as needed for anxiety.     Marland Kitchen losartan (COZAAR) 50 MG tablet TAKE 1 Tablet BY MOUTH EVERY DAY    . losartan (COZAAR) 50 MG tablet Take 1 tablet (50 mg total) by mouth daily.    . mirtazapine (REMERON) 15 MG tablet Take 15 mg by mouth at bedtime.    . Multiple Vitamin (MULTIVITAMIN) tablet Take 1 tablet by mouth daily.    Marland Kitchen NAPROXEN PO Take 1 tablet by mouth as needed.    . nitroGLYCERIN (NITROSTAT) 0.4 MG SL tablet Place 1 tablet (0.4 mg total) under the tongue every  5 (five) minutes as needed for chest pain.    Marland Kitchen omeprazole (PRILOSEC) 20 MG capsule Take 1 capsule (20 mg total) by mouth daily.    . propranolol (INDERAL) 80 MG tablet TAKE 1 TABLET BY MOUTH TWICE A DAY    . simvastatin (ZOCOR) 20 MG tablet TAKE 1 TABLET BY MOUTH EVERY NIGHT AT BEDTIME FOR CHOLESTEROL    . traZODone (DESYREL) 50 MG tablet Take 75 mg by mouth at bedtime as needed for sleep.     Marland Kitchen zinc gluconate 50 MG tablet Take 50 mg by mouth daily.     Review of Systems PER HPI OTHERWISE ALL SYSTEMS ARE NEGATIVE.    Objective:   Physical Exam  Constitutional: She is oriented to person, place, and time. She appears well-developed and well-nourished. No distress.  HENT:  Head: Normocephalic and atraumatic.  Mouth/Throat: Oropharynx is clear and moist. No oropharyngeal exudate.  Eyes: Pupils are equal, round, and reactive to light. No scleral icterus.  Neck: Normal range of motion. Neck supple.  Cardiovascular: Normal rate, regular rhythm and normal heart sounds.   LEFT FEMORAL BRUIT, 2+ PEDAL PULSES BILATERALLY, UNABLE TO DETECT POSTERIOR TIBIAL PULSE BILATERALLY, SLIGHTLY HYPERTROPHIC TOE NAILS BILATERALLY  Pulmonary/Chest: Effort normal and breath sounds normal. No respiratory distress.  Abdominal: Soft. Bowel sounds are normal. She exhibits no distension. There is no tenderness.  NO RENAL BRUITS  Musculoskeletal: She exhibits no edema.  Lymphadenopathy:    She has no cervical adenopathy.  Neurological: She is alert and oriented to person, place, and time.  NO  NEW FOCAL DEFICITS  Psychiatric:  FLAT AFFECT, NL MOOD  Vitals reviewed.     Assessment & Plan:

## 2017-01-25 NOTE — Progress Notes (Signed)
ON RECALL  °

## 2017-01-25 NOTE — Progress Notes (Signed)
cc'ed to pcp °

## 2017-01-25 NOTE — Assessment & Plan Note (Signed)
SYMPTOMS NOT CONTROLLED. STOPPED SMOKING 2 MOS AGO. LOOKING FOR 1.0 ON EACH LEG-NL < 0.6 IS SEVERE.  COMPLETE VASCULAR US. FOLLOW UP IN 6 MOS.

## 2017-01-25 NOTE — Assessment & Plan Note (Signed)
SYMPTOMS CONTROLLED/RESOLVED.  CONTINUE TO MONITOR SYMPTOMS. 

## 2017-01-25 NOTE — Patient Instructions (Signed)
COMPLETE BLOOD COUNT IN LATE AUGUST OR EARLY September.  COMPLETE VASCULAR ULTRASOUND.   PLEASE CALL WITH QUESTIONS OR CONCERNS.  FOLLOW UP IN 6 MOS.

## 2017-02-06 ENCOUNTER — Other Ambulatory Visit: Payer: Self-pay

## 2017-02-06 DIAGNOSIS — D649 Anemia, unspecified: Secondary | ICD-10-CM

## 2017-02-21 ENCOUNTER — Other Ambulatory Visit (HOSPITAL_COMMUNITY)
Admission: RE | Admit: 2017-02-21 | Discharge: 2017-02-21 | Disposition: A | Discharge status: Home / Self Care | Payer: Medicare Other | Source: Ambulatory Visit | Attending: Gastroenterology | Admitting: Gastroenterology

## 2017-02-21 ENCOUNTER — Other Ambulatory Visit: Payer: Self-pay | Admitting: Physician Assistant

## 2017-02-21 DIAGNOSIS — D649 Anemia, unspecified: Secondary | ICD-10-CM | POA: Insufficient documentation

## 2017-02-21 LAB — CBC WITH DIFFERENTIAL/PLATELET
Basophils Absolute: 0 10*3/uL (ref 0.0–0.1)
Basophils Relative: 0 %
EOS ABS: 0.2 10*3/uL (ref 0.0–0.7)
EOS PCT: 3 %
HCT: 32.3 % — ABNORMAL LOW (ref 36.0–46.0)
Hemoglobin: 10.6 g/dL — ABNORMAL LOW (ref 12.0–15.0)
LYMPHS ABS: 2.2 10*3/uL (ref 0.7–4.0)
Lymphocytes Relative: 30 %
MCH: 27.7 pg (ref 26.0–34.0)
MCHC: 32.8 g/dL (ref 30.0–36.0)
MCV: 84.6 fL (ref 78.0–100.0)
MONOS PCT: 9 %
Monocytes Absolute: 0.7 10*3/uL (ref 0.1–1.0)
Neutro Abs: 4.3 10*3/uL (ref 1.7–7.7)
Neutrophils Relative %: 58 %
PLATELETS: 314 10*3/uL (ref 150–400)
RBC: 3.82 MIL/uL — ABNORMAL LOW (ref 3.87–5.11)
RDW: 17.3 % — ABNORMAL HIGH (ref 11.5–15.5)
WBC: 7.5 10*3/uL (ref 4.0–10.5)

## 2017-02-21 LAB — FERRITIN: FERRITIN: 32 ng/mL (ref 11–307)

## 2017-02-24 ENCOUNTER — Emergency Department (HOSPITAL_COMMUNITY)
Admission: EM | Admit: 2017-02-24 | Discharge: 2017-02-24 | Disposition: A | Discharge status: Home / Self Care | Payer: Medicare Other | Attending: Emergency Medicine | Admitting: Emergency Medicine

## 2017-02-24 ENCOUNTER — Encounter (HOSPITAL_COMMUNITY): Payer: Self-pay | Admitting: *Deleted

## 2017-02-24 DIAGNOSIS — M7989 Other specified soft tissue disorders: Secondary | ICD-10-CM

## 2017-02-24 DIAGNOSIS — R2243 Localized swelling, mass and lump, lower limb, bilateral: Secondary | ICD-10-CM | POA: Insufficient documentation

## 2017-02-24 DIAGNOSIS — Z5321 Procedure and treatment not carried out due to patient leaving prior to being seen by health care provider: Secondary | ICD-10-CM | POA: Diagnosis not present

## 2017-02-24 NOTE — ED Notes (Signed)
Went in to see patient, patient not in room.

## 2017-02-24 NOTE — ED Triage Notes (Signed)
Hx Copd Swelling of feet for the last 4 days-  [facility] - no call

## 2017-02-24 NOTE — ED Triage Notes (Signed)
Pt c/o bilateral feet swelling x 4 days. Denies any unusual SOB, pt reports COPD.

## 2017-02-27 ENCOUNTER — Other Ambulatory Visit: Payer: Self-pay | Admitting: Physician Assistant

## 2017-02-27 MED ORDER — BUTALBITAL-APAP-CAFF-COD 50-325-40-30 MG PO CAPS: ORAL_CAPSULE | ORAL | 0 refills | Status: DC

## 2017-02-27 NOTE — Progress Notes (Signed)
Dr. Oneida Alar, you recently saw this patient and wanted labs this month. Her Hgb remains low but stable, ferritin normal. Any additional recommendations?

## 2017-03-05 ENCOUNTER — Other Ambulatory Visit: Payer: Self-pay | Admitting: Physician Assistant

## 2017-03-09 NOTE — Progress Notes (Signed)
Pt is aware.  

## 2017-03-21 ENCOUNTER — Other Ambulatory Visit: Payer: Self-pay | Admitting: Physician Assistant

## 2017-03-21 ENCOUNTER — Ambulatory Visit: Payer: Self-pay | Admitting: Physician Assistant

## 2017-04-06 ENCOUNTER — Other Ambulatory Visit: Payer: Self-pay | Admitting: Physician Assistant

## 2017-04-06 DIAGNOSIS — G43909 Migraine, unspecified, not intractable, without status migrainosus: Secondary | ICD-10-CM | POA: Diagnosis not present

## 2017-04-06 DIAGNOSIS — M130 Polyarthritis, unspecified: Secondary | ICD-10-CM | POA: Diagnosis not present

## 2017-04-06 DIAGNOSIS — R2243 Localized swelling, mass and lump, lower limb, bilateral: Secondary | ICD-10-CM | POA: Diagnosis not present

## 2017-04-09 ENCOUNTER — Other Ambulatory Visit: Payer: Self-pay | Admitting: Physician Assistant

## 2017-04-09 MED ORDER — LOSARTAN POTASSIUM 50 MG PO TABS: 50.0000 mg | ORAL_TABLET | Freq: Every day | ORAL | 0 refills | Status: DC

## 2017-04-09 MED ORDER — MOMETASONE FURO-FORMOTEROL FUM 100-5 MCG/ACT IN AERO: INHALATION_SPRAY | RESPIRATORY_TRACT | 0 refills | Status: DC

## 2017-04-13 ENCOUNTER — Other Ambulatory Visit: Payer: Self-pay | Admitting: Physician Assistant

## 2017-04-16 ENCOUNTER — Other Ambulatory Visit: Payer: Self-pay | Admitting: Physician Assistant

## 2017-04-16 MED ORDER — AMLODIPINE BESYLATE 10 MG PO TABS: 10.0000 mg | ORAL_TABLET | Freq: Every day | ORAL | 0 refills | Status: DC

## 2017-05-02 ENCOUNTER — Ambulatory Visit (INDEPENDENT_AMBULATORY_CARE_PROVIDER_SITE_OTHER): Payer: Medicare Other | Admitting: Family Medicine

## 2017-05-02 ENCOUNTER — Encounter: Payer: Self-pay | Admitting: Family Medicine

## 2017-05-02 VITALS — BP 116/66 | HR 84 | Temp 96.1°F | Resp 20 | Ht 64.0 in | Wt 137.1 lb

## 2017-05-02 DIAGNOSIS — F1911 Other psychoactive substance abuse, in remission: Secondary | ICD-10-CM

## 2017-05-02 DIAGNOSIS — I10 Essential (primary) hypertension: Secondary | ICD-10-CM

## 2017-05-02 DIAGNOSIS — M19041 Primary osteoarthritis, right hand: Secondary | ICD-10-CM

## 2017-05-02 DIAGNOSIS — E559 Vitamin D deficiency, unspecified: Secondary | ICD-10-CM | POA: Diagnosis not present

## 2017-05-02 DIAGNOSIS — M19042 Primary osteoarthritis, left hand: Secondary | ICD-10-CM

## 2017-05-02 DIAGNOSIS — Z720 Tobacco use: Secondary | ICD-10-CM | POA: Diagnosis not present

## 2017-05-02 DIAGNOSIS — Z23 Encounter for immunization: Secondary | ICD-10-CM

## 2017-05-02 DIAGNOSIS — J449 Chronic obstructive pulmonary disease, unspecified: Secondary | ICD-10-CM

## 2017-05-02 DIAGNOSIS — I70219 Atherosclerosis of native arteries of extremities with intermittent claudication, unspecified extremity: Secondary | ICD-10-CM | POA: Diagnosis not present

## 2017-05-02 HISTORY — DX: Other psychoactive substance abuse, in remission: F19.11

## 2017-05-02 MED ORDER — LOSARTAN POTASSIUM 50 MG PO TABS: 50.0000 mg | ORAL_TABLET | Freq: Every day | ORAL | 3 refills | Status: DC

## 2017-05-02 MED ORDER — BUDESONIDE-FORMOTEROL FUMARATE 160-4.5 MCG/ACT IN AERO: 2.0000 | INHALATION_SPRAY | Freq: Two times a day (BID) | RESPIRATORY_TRACT | 11 refills | Status: DC

## 2017-05-02 MED ORDER — AMLODIPINE BESYLATE 10 MG PO TABS: 10.0000 mg | ORAL_TABLET | Freq: Every day | ORAL | 3 refills | Status: DC

## 2017-05-02 MED ORDER — SUMATRIPTAN SUCCINATE 50 MG PO TABS: ORAL_TABLET | ORAL | 6 refills | Status: DC

## 2017-05-02 MED ORDER — SIMVASTATIN 40 MG PO TABS: 40.0000 mg | ORAL_TABLET | Freq: Every day | ORAL | 3 refills | Status: DC

## 2017-05-02 NOTE — Progress Notes (Signed)
Chief Complaint  Patient presents with  . COPD   Patient has COPD.  She states she cannot afford her Dulera.  We are going to prescribe Symbicort for her.  She does have some coughing and sputum production in the morning.  It is mostly white.  She does have shortness of breath with exertion.  She also has cramping in her calves (likely claudication)  She has albuterol by nebulizer that she uses infrequently.  She does have a history of pneumonia last year. I have discussed the multiple health risks associated with cigarette smoking including, but not limited to, cardiovascular disease, lung disease and cancer.  I have strongly recommended that smoking be stopped.  I have reviewed the various methods of quitting including cold [place], classes, nicotine replacements and prescription medications.  I have offered assistance in this difficult process.  The patient is not interested in assistance at this time.  He only smokes 2 cigarettes a day.  She is strongly advised to quit. Patient has a history of GERD, ulcers, GI bleeding.  She is on omeprazole.  Currently is not having any trouble with her bowels or appetite. Patient has a history of PTSD and anxiety.  She is under the care of a psychiatrist.  She is on mirtazapine, fluoxetine, trazodone, and Ativan.  She states that she is stable.  She feels like she needs all of these medications.  She appears sedated on today's examination. Patient has chronic "migraine" headaches.  She gets 20 Fioricet with codeine a month.  I told her I will not give her this prescription especially with the Ativan.  She will try prescription for Imitrex.  If this does not help her migraine she will be referred to neurology.  I explained that it was not safe to give her narcotics while she was taking a benzodiazepine. Patient is up-to-date with her health maintenance.  Pap in 2016, mammogram last year.  Colonoscopy 2016.  Immunizations up-to-date.  She agrees to a pneumonia shot  today. Patient has a prescription for nitroglycerin.  She takes this as needed for chest pain.  She states she takes it about once a month.  She states it only works part of the time.  I reviewed her medical record.  She has been worked up by cardiology.  She does not have any ischemic disease.  I have told her to discontinue the nitroglycerin  Patient Active Problem List   Diagnosis Date Noted  . Tobacco abuse 05/02/2017  . 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 Atherosclerotic peripheral vascular disease with intermittent claudication ([place]) 01/25/2017  . GERD (gastroesophageal reflux disease) 10/13/2015  . Chronic obstructive pulmonary disease ([place]) 07/14/2015  . Obstructive chronic bronchitis without exacerbation ([place]) 04/13/2015  . Headache, tension type, chronic 04/13/2015  . Anxiety state 02/27/2015  . FH: colon cancer 01/15/2015  . Hepatitis C virus infection resolved after antiviral drug therapy 01/15/2015  . Hyperlipidemia 11/26/2008  . UNSPECIFIED ANEMIA 11/26/2008  . MIGRAINE HEADACHE 11/11/2008  . Essential hypertension, benign 11/11/2008  . Osteoarthritis of hands, bilateral 11/11/2008    Outpatient Encounter Prescriptions as of 05/02/2017  Medication Sig  . albuterol (PROVENTIL) (2.5 MG/3ML) 0.083% nebulizer solution INHALE 1 VIAL VIA NEBULIZER EVERY 6 HOURS AS NEEDED FOR WHEEZING AND SHORTNESS OF BREATH  . amLODipine (NORVASC) 10 MG tablet Take 1 tablet (10 mg total) by mouth daily.  . Ascorbic Acid (VITAMIN C PO) Take 1 tablet by mouth daily.  Marland Kitchen CALCIUM-VITAMIN D PO Take 1 tablet  by mouth daily.  . Ferrous Sulfate (IRON) 325 (65 Fe) MG TABS Take 1 tablet by mouth daily.  Marland Kitchen FLUoxetine (PROZAC) 10 MG capsule Take 40 mg by mouth daily.   Marland Kitchen LORazepam (ATIVAN) 0.5 MG tablet Take 0.5 mg by mouth every 12 (twelve) hours as needed for anxiety.   Marland Kitchen losartan (COZAAR) 50 MG tablet Take 1 tablet (50 mg total) by mouth daily.  . mirtazapine (REMERON) 15 MG  tablet Take 15 mg by mouth at bedtime.  . mometasone-formoterol (DULERA) 100-5 MCG/ACT AERO INHALE 2 PUFFS BY MOUTH TWICE DAILY. RINSE MOUTH AFTER EACH USE  . Multiple Vitamin (MULTIVITAMIN) tablet Take 1 tablet by mouth daily.  Marland Kitchen omeprazole (PRILOSEC) 20 MG capsule Take 1 capsule (20 mg total) by mouth daily.  . propranolol (INDERAL) 80 MG tablet TAKE 1 TABLET BY MOUTH TWICE A DAY  . simvastatin (ZOCOR) 40 MG tablet Take 1 tablet (40 mg total) by mouth daily at 6 PM.  . traZODone (DESYREL) 50 MG tablet Take 75 mg by mouth at bedtime as needed for sleep.   . budesonide-formoterol (SYMBICORT) 160-4.5 MCG/ACT inhaler Inhale 2 puffs into the lungs 2 (two) times daily.  . SUMAtriptan (IMITREX) 50 MG tablet Take at beginning of migraine.  Take a second dose in 2 hours if needed   No facility-administered encounter medications on file as of 05/02/2017.     Past Medical History:  Diagnosis Date  . Allergy   . Anemia   . Angina pectoris ([place])   . Anxiety   . Arthritis   . Bronchitis   . Bronchitis   . COPD (chronic obstructive pulmonary disease) ([place])   . Depression   . GERD (gastroesophageal reflux disease)   . HCV (hepatitis C virus)    2018 COMPLETE VIROLOGIC RESPONSE  . High blood cholesterol level   . Hypertension   . Migraine   . Migraines   . Neuromuscular disorder ([place])   . Pneumonia   . PTSD (post-traumatic stress disorder)     Past Surgical History:  Procedure Laterality Date  . BIOPSY  02/09/2015   Procedure: GASTRIC BIOPSIES ;  Surgeon: Danie Binder, MD;  Location: [facility];  Service: Endoscopy;;  . COLONOSCOPY WITH PROPOFOL N/A 02/09/2015   SLF: 1. the examined terminal ileum appeared to be normal 2. the colonic mucosa appeared normal 3. small internal hemorrhoids  . ESOPHAGOGASTRODUODENOSCOPY (EGD) WITH PROPOFOL N/A 02/09/2015   SLF: 1. stricture at the gastroesophageal junction 2. mild non-erosive gastritis   . GIVENS CAPSULE STUDY N/A 10/19/2016   Procedure: GIVENS  CAPSULE STUDY;  Surgeon: Danie Binder, MD;  Location: [facility] SUITE;  Service: Endoscopy;  Laterality: N/A;  7:30am, pt to arrive at 7:00am  . left arm     ? nerve repaired, " it was pinched".  . left elbow surgery Left   . SAVORY DILATION N/A 02/09/2015   Procedure: SAVORY DILATION 12.67mm, 72mm, 34mm, 88mm;  Surgeon: Danie Binder, MD;  Location: [facility];  Service: Endoscopy;  Laterality: N/A;  . TONSILLECTOMY    . TUBAL LIGATION      Social History   Social History  . Marital status: Married    Spouse name: Cheno  . Number of children: 1  . Years of education: N/A   Occupational History  . disability     lung disease / COPD   Social History Main Topics  . Smoking status: Current Every Day Smoker    Packs/day: 0.25  Years: 42.00    Types: Cigarettes    Start date: 09/29/1972  . Smokeless tobacco: Never Used     Comment: about 2 cigs per day  . Alcohol use No  . Drug use: No     Comment: hx of smoking crack cocaine last 5-6 years ago  . Sexual activity: No   Other Topics Concern  . Not on file   Social History Narrative   Married to [employer] for 28 years   Keep her sister - she has seizures/disabled       Family History  Problem Relation Age of Onset  . Heart failure Father   . Hypertension Father   . Stroke Father   . Colon cancer Father        greater than age 22  . Cancer Father   . Dementia Father   . Cancer Mother   . Seizures Sister     Review of Systems  Constitutional: Positive for malaise/fatigue. Negative for chills, fever and weight loss.       Chronic  HENT: Negative for congestion and hearing loss.   Eyes: Negative for blurred vision and pain.  Respiratory: Positive for cough, sputum production and shortness of breath.        Smoker with COPD  Cardiovascular: Positive for chest pain. Negative for leg swelling.       Noncardiac  Gastrointestinal: Negative for abdominal pain, constipation, diarrhea and heartburn.       GERD controlled    Genitourinary: Negative for dysuria and frequency.  Musculoskeletal: Positive for joint pain. Negative for falls and myalgias.       Chronic "arthritis" pain hands and arms mostly  Neurological: Positive for headaches. Negative for dizziness and seizures.       Frequent headaches.  Patient takes 84 Fioricet a month.  Psychiatric/Behavioral: Positive for depression. The patient is nervous/anxious and has insomnia.        PTSD.  Under care of psychiatry    BP 116/66 (BP Location: Right Arm, Patient Position: Sitting, Cuff Size: Normal)   Pulse 84   Temp (!) 96.1 F (35.6 C) (Temporal)   Resp 20   Ht 5\' 4"  (1.626 m)   Wt 137 lb 1.3 oz (62.2 kg)   SpO2 96%   BMI 23.53 kg/m   Physical Exam  Constitutional: She is oriented to person, place, and time. She appears well-developed and well-nourished.  Appears very sleepy.  Eyes half open.  Slow responses.  HENT:  Head: Normocephalic and atraumatic.  Mouth/Throat: Oropharynx is clear and moist.  Eyes: Pupils are equal, round, and reactive to light. Conjunctivae are normal.  Neck: Normal range of motion. Neck supple. No thyromegaly present.  Cardiovascular: Normal rate, regular rhythm and normal heart sounds.   Pulmonary/Chest: Effort normal and breath sounds normal. No respiratory distress.  Musculoskeletal: Normal range of motion. She exhibits no edema.  Lymphadenopathy:    She has no cervical adenopathy.  Neurological: She is alert and oriented to person, place, and time.  Gait normal  Skin: Skin is warm and dry.  Psychiatric: She has a normal mood and affect. Her behavior is normal. Thought content normal.  Nursing note and vitals reviewed. ASSESSMENT/PLAN:  1. Primary osteoarthritis of both hands  2. Tobacco abuse  3. Essential hypertension, benign  4. Atherosclerotic peripheral vascular disease with intermittent claudication ([place])  5. Chronic obstructive pulmonary disease, unspecified COPD type ([place]) - CBC - COMPLETE  METABOLIC PANEL WITH GFR - Lipid panel - VITAMIN D  25 Hydroxy (Vit-D Deficiency, Fractures) - Urinalysis, Routine w reflex microscopic  6. Vitamin D deficiency - VITAMIN D 25 Hydroxy (Vit-D Deficiency, Fractures)  7. Need for pneumococcal vaccination - Pneumococcal conjugate vaccine 13-valent IM   Patient Instructions  Take the imitrex as needed for headache Take at first warning a headache is coming  Take the other medicines as prescribed You do not need nitro, there is no evidence of heart disease  Change the dulera to symbicort  You need to STOP SMOKING  See me in 1-2 months for a PE   Raylene Everts, MD

## 2017-05-02 NOTE — Patient Instructions (Addendum)
Take the imitrex as needed for headache Take at first warning a headache is coming  Take the other medicines as prescribed You do not need nitro, there is no evidence of heart disease  Change the dulera to symbicort  You need to STOP SMOKING  See me in 1-2 months for a PE

## 2017-05-03 LAB — URINALYSIS, ROUTINE W REFLEX MICROSCOPIC
BILIRUBIN URINE: NEGATIVE
Bacteria, UA: NONE SEEN /HPF
GLUCOSE, UA: NEGATIVE
Hgb urine dipstick: NEGATIVE
Ketones, ur: NEGATIVE
Leukocytes, UA: NEGATIVE
NITRITE: NEGATIVE
PROTEIN: NEGATIVE
SPECIFIC GRAVITY, URINE: 1.017 (ref 1.001–1.03)
pH: <=5 (ref 5.0–8.0)

## 2017-05-03 LAB — LIPID PANEL
CHOLESTEROL: 211 mg/dL — AB (ref <200)
HDL: 44 mg/dL — AB (ref >50)
LDL CHOLESTEROL (CALC): 136 mg/dL — AB
Non-HDL Cholesterol (Calc): 167 mg/dL (calc) — ABNORMAL HIGH (ref <130)
TRIGLYCERIDES: 176 mg/dL — AB (ref <150)
Total CHOL/HDL Ratio: 4.8 (calc) (ref <5.0)

## 2017-05-03 LAB — COMPLETE METABOLIC PANEL WITH GFR
AG Ratio: 1.3 (calc) (ref 1.0–2.5)
ALT: 12 U/L (ref 6–29)
AST: 16 U/L (ref 10–35)
Albumin: 4.1 g/dL (ref 3.6–5.1)
Alkaline phosphatase (APISO): 65 U/L (ref 33–130)
BILIRUBIN TOTAL: 0.3 mg/dL (ref 0.2–1.2)
BUN / CREAT RATIO: 9 (calc) (ref 6–22)
BUN: 15 mg/dL (ref 7–25)
CO2: 22 mmol/L (ref 20–32)
Calcium: 9.6 mg/dL (ref 8.6–10.4)
Chloride: 108 mmol/L (ref 98–110)
Creat: 1.59 mg/dL — ABNORMAL HIGH (ref 0.50–0.99)
GFR, EST AFRICAN AMERICAN: 39 mL/min/{1.73_m2} — AB (ref >=60)
GFR, EST NON AFRICAN AMERICAN: 34 mL/min/{1.73_m2} — AB (ref >=60)
GLOBULIN: 3.2 g/dL (ref 1.9–3.7)
GLUCOSE: 96 mg/dL (ref 65–99)
POTASSIUM: 4.1 mmol/L (ref 3.5–5.3)
SODIUM: 140 mmol/L (ref 135–146)
Total Protein: 7.3 g/dL (ref 6.1–8.1)

## 2017-05-03 LAB — CBC
HCT: 31.3 % — ABNORMAL LOW (ref 35.0–45.0)
Hemoglobin: 10.1 g/dL — ABNORMAL LOW (ref 11.7–15.5)
MCH: 27.2 pg (ref 27.0–33.0)
MCHC: 32.3 g/dL (ref 32.0–36.0)
MCV: 84.1 fL (ref 80.0–100.0)
MPV: 10.6 fL (ref 7.5–12.5)
Platelets: 330 10*3/uL (ref 140–400)
RBC: 3.72 10*6/uL — ABNORMAL LOW (ref 3.80–5.10)
RDW: 15.1 % — AB (ref 11.0–15.0)
WBC: 7.8 10*3/uL (ref 3.8–10.8)

## 2017-05-03 LAB — VITAMIN D 25 HYDROXY (VIT D DEFICIENCY, FRACTURES): VIT D 25 HYDROXY: 46 ng/mL (ref 30–100)

## 2017-05-04 ENCOUNTER — Encounter: Payer: Self-pay | Admitting: Family Medicine

## 2017-05-14 ENCOUNTER — Other Ambulatory Visit: Payer: Self-pay | Admitting: Physician Assistant

## 2017-05-16 ENCOUNTER — Other Ambulatory Visit: Payer: Self-pay | Admitting: Family Medicine

## 2017-05-16 NOTE — Telephone Encounter (Signed)
Seen [phone]

## 2017-05-17 ENCOUNTER — Other Ambulatory Visit: Payer: Self-pay

## 2017-05-17 ENCOUNTER — Ambulatory Visit (INDEPENDENT_AMBULATORY_CARE_PROVIDER_SITE_OTHER): Payer: Medicare Other | Admitting: Family Medicine

## 2017-05-17 ENCOUNTER — Encounter: Payer: Self-pay | Admitting: Family Medicine

## 2017-05-17 VITALS — BP 110/76 | HR 76 | Temp 99.0°F | Resp 20 | Ht 64.0 in | Wt 139.1 lb

## 2017-05-17 DIAGNOSIS — R600 Localized edema: Secondary | ICD-10-CM | POA: Diagnosis not present

## 2017-05-17 DIAGNOSIS — J810 Acute pulmonary edema: Secondary | ICD-10-CM | POA: Diagnosis not present

## 2017-05-17 MED ORDER — FUROSEMIDE 20 MG PO TABS: 20.0000 mg | ORAL_TABLET | Freq: Two times a day (BID) | ORAL | 3 refills | Status: DC

## 2017-05-17 MED ORDER — IBUPROFEN 800 MG PO TABS: 800.0000 mg | ORAL_TABLET | Freq: Three times a day (TID) | ORAL | 0 refills | Status: DC | PRN

## 2017-05-17 NOTE — Progress Notes (Signed)
Chief Complaint  Patient presents with  . Foot Swelling    x 2 weeks   Reasonably new patient to me.  She was seen by her initial visit on 05/02/2017.  This is her only visit.  Today she calls in with swelling in her feet and ankles.  She is worked in this afternoon.  She tells me this is been going on since the weekend.  She has had no change in her diet or salt load.  She has no chest pain.  She is more short of breath.  She has known ongoing COPD.  She continues to smoke.  She did have an echocardiogram in 2014 that showed some increased pulmonary pressure.  She has never been diagnosed with heart failure.  She had a Myoview stress test in 2016 that showed no ischemic disease.  She has no known liver or kidney disease.  She states her nutrition is good. Prior to coming to this office she had pedal edema and went to an urgent care center.  I have that record to review.  They thought she was having an arthritis flare, and gave her a steroid Pred pack.  They did blood work.  Rheumatoid factor, uric acid, and ANA test were all negative.  The sed rate was markedly high at 114.  .  She remembers that she felt much better after taking the steroids.  She does not remember if her symptoms this time are the same as last time.  She really does not think she had shortness of breath last time.  Patient Active Problem List   Diagnosis Date Noted  . Tobacco abuse 05/02/2017  . 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 Atherosclerotic peripheral vascular disease with intermittent claudication ([place]) 01/25/2017  . GERD (gastroesophageal reflux disease) 10/13/2015  . Chronic obstructive pulmonary disease ([place]) 07/14/2015  . Obstructive chronic bronchitis without exacerbation ([place]) 04/13/2015  . Headache, tension type, chronic 04/13/2015  . Anxiety state 02/27/2015  . FH: colon cancer 01/15/2015  . Hepatitis C virus infection resolved after  antiviral drug therapy 01/15/2015  . Hyperlipidemia 11/26/2008  . UNSPECIFIED ANEMIA 11/26/2008  . MIGRAINE HEADACHE 11/11/2008  . Essential hypertension, benign 11/11/2008  . Osteoarthritis of hands, bilateral 11/11/2008    Outpatient Encounter Medications as of 05/17/2017  Medication Sig  . albuterol (PROVENTIL) (2.5 MG/3ML) 0.083% nebulizer solution INHALE 1 VIAL VIA NEBULIZER EVERY 6 HOURS AS NEEDED FOR WHEEZING AND SHORTNESS OF BREATH  . amLODipine (NORVASC) 10 MG tablet Take 1 tablet (10 mg total) by mouth daily.  . Ascorbic Acid (VITAMIN C PO) Take 1 tablet by mouth daily.  . budesonide-formoterol (SYMBICORT) 160-4.5 MCG/ACT inhaler Inhale 2 puffs into the lungs 2 (two) times daily.  Marland Kitchen CALCIUM-VITAMIN D PO Take 1 tablet by mouth daily.  . Ferrous Sulfate (IRON) 325 (65 Fe) MG TABS Take 1 tablet by mouth daily.  Marland Kitchen FLUoxetine (PROZAC) 10 MG capsule Take 40 mg by mouth daily.   Marland Kitchen LORazepam (ATIVAN) 0.5 MG tablet Take 0.5 mg by mouth every 12 (twelve) hours as needed for anxiety.   Marland Kitchen losartan (COZAAR) 50 MG tablet Take 1 tablet (50 mg total) by mouth daily.  . mirtazapine (REMERON) 15 MG tablet Take 15 mg by mouth at bedtime.  . mometasone-formoterol (DULERA) 100-5 MCG/ACT AERO INHALE 2 PUFFS BY MOUTH TWICE DAILY. RINSE MOUTH AFTER EACH USE  . Multiple Vitamin (MULTIVITAMIN) tablet Take 1 tablet by mouth daily.  Marland Kitchen  omeprazole (PRILOSEC) 20 MG capsule Take 1 capsule (20 mg total) by mouth daily.  . propranolol (INDERAL) 80 MG tablet TAKE 1 TABLET BY MOUTH TWICE A DAY  . simvastatin (ZOCOR) 20 MG tablet TAKE 1 TABLET BY MOUTH EVERY NIGHT AT BEDTIME FOR CHOLESTEROL  . simvastatin (ZOCOR) 40 MG tablet Take 1 tablet (40 mg total) by mouth daily at 6 PM.  . SUMAtriptan (IMITREX) 50 MG tablet Take at beginning of migraine.  Take a second dose in 2 hours if needed  . traZODone (DESYREL) 50 MG tablet Take 75 mg by mouth at bedtime as needed for sleep.   . furosemide (LASIX) 20 MG tablet Take 1  tablet (20 mg total) 2 (two) times daily by mouth. Morning and noon  . ibuprofen (ADVIL,MOTRIN) 800 MG tablet Take 1 tablet (800 mg total) every 8 (eight) hours as needed by mouth for moderate pain.   No facility-administered encounter medications on file as of 05/17/2017.     No Known Allergies  Review of Systems  Constitutional: Positive for fatigue. Negative for activity change, appetite change and unexpected weight change.  HENT: Negative for congestion, dental problem, postnasal drip and rhinorrhea.   Eyes: Negative for redness and visual disturbance.  Respiratory: Positive for cough and shortness of breath. Negative for wheezing.   Cardiovascular: Positive for leg swelling. Negative for chest pain and palpitations.  Gastrointestinal: Positive for abdominal distention and abdominal pain. Negative for constipation and diarrhea.       States lower abdomen feels full and sore  Genitourinary: Negative for difficulty urinating and frequency.  Musculoskeletal: Positive for arthralgias. Negative for back pain.       Chronic  Neurological: Negative for dizziness and headaches.  Psychiatric/Behavioral: Negative for dysphoric mood and sleep disturbance. The patient is not nervous/anxious.        Under care of psychiatry  Weight is up 2 pounds from last visit  BP 110/76 (BP Location: Left Arm, Patient Position: Sitting, Cuff Size: Normal)   Pulse 76   Temp 99 F (37.2 C) (Oral)   Resp 20   Ht 5\' 4"  (1.626 m)   Wt 139 lb 1.9 oz (63.1 kg)   BMI 23.88 kg/m   Physical Exam  Constitutional: She appears well-developed and well-nourished. She appears distressed.  Appears uncomfortable.  Appears tired.  HENT:  Head: Normocephalic and atraumatic.  Mouth/Throat: Oropharynx is clear and moist.  Eyes: Pupils are equal, round, and reactive to light.  Neck: Normal range of motion. No JVD present.  Cardiovascular: Normal rate, regular rhythm and normal heart sounds.  Pulmonary/Chest: Effort  normal. She has rales.  Bibasilar rales  Abdominal: Soft. Bowel sounds are normal.  Diffuse mild tenderness  Musculoskeletal:  Pitting edema to knees  Lymphadenopathy:    She has no cervical adenopathy.  Skin: No rash noted. No erythema.  Psychiatric: She has a normal mood and affect. Her behavior is normal.  Quiet behavior    ASSESSMENT/PLAN:  1. Pedal edema  - DG Chest 2 View; Future - ECHOCARDIOGRAM COMPLETE; Future - CBC with Differential/Platelet - COMPLETE METABOLIC PANEL WITH GFR - B Nat Peptide  2. Pulmonary edema, acute [facility])  Discussed with patient that this can be a multiple etiologies.  Can be circulation, heart failure.  Can be liver or kidneys are metabolic.  Thinking he structural is in a blockage in her abdomen.  We will need to start with blood work chest x-ray and echocardiogram.  Will follow with abdominal studies if indicated.  Patient Instructions  Need blood work Need chest x ray Need echocardiogram Take the furosemide for fluid This will cause increase in urine frequency Take the ibuprofen for pain Take with food See me in 3-5 days   Raylene Everts, MD

## 2017-05-17 NOTE — Patient Instructions (Signed)
Need blood work Need chest x ray Need echocardiogram Take the furosemide for fluid This will cause increase in urine frequency Take the ibuprofen for pain Take with food See me in 3-5 days

## 2017-05-18 ENCOUNTER — Ambulatory Visit (HOSPITAL_COMMUNITY)
Admission: RE | Admit: 2017-05-18 | Discharge: 2017-05-18 | Disposition: A | Discharge status: Home / Self Care | Payer: Medicare Other | Source: Ambulatory Visit | Attending: Family Medicine | Admitting: Family Medicine

## 2017-05-18 ENCOUNTER — Other Ambulatory Visit (HOSPITAL_COMMUNITY)
Admission: RE | Admit: 2017-05-18 | Discharge: 2017-05-18 | Disposition: A | Discharge status: Home / Self Care | Payer: Medicare Other | Source: Ambulatory Visit | Attending: Family Medicine | Admitting: Family Medicine

## 2017-05-18 DIAGNOSIS — J810 Acute pulmonary edema: Secondary | ICD-10-CM | POA: Diagnosis not present

## 2017-05-18 DIAGNOSIS — R600 Localized edema: Secondary | ICD-10-CM | POA: Diagnosis not present

## 2017-05-18 DIAGNOSIS — I517 Cardiomegaly: Secondary | ICD-10-CM | POA: Diagnosis not present

## 2017-05-18 DIAGNOSIS — I878 Other specified disorders of veins: Secondary | ICD-10-CM | POA: Diagnosis not present

## 2017-05-18 DIAGNOSIS — R079 Chest pain, unspecified: Secondary | ICD-10-CM | POA: Diagnosis not present

## 2017-05-18 LAB — COMPREHENSIVE METABOLIC PANEL
ALBUMIN: 3.8 g/dL (ref 3.5–5.0)
ALK PHOS: 69 U/L (ref 38–126)
ALT: 13 U/L — ABNORMAL LOW (ref 14–54)
AST: 19 U/L (ref 15–41)
Anion gap: 9 (ref 5–15)
BILIRUBIN TOTAL: 0.6 mg/dL (ref 0.3–1.2)
BUN: 8 mg/dL (ref 6–20)
CALCIUM: 9.1 mg/dL (ref 8.9–10.3)
CO2: 23 mmol/L (ref 22–32)
Chloride: 107 mmol/L (ref 101–111)
Creatinine, Ser: 0.97 mg/dL (ref 0.44–1.00)
GFR calc Af Amer: >60 mL/min (ref >60)
GFR calc non Af Amer: >60 mL/min (ref >60)
GLUCOSE: 95 mg/dL (ref 65–99)
POTASSIUM: 3.5 mmol/L (ref 3.5–5.1)
Sodium: 139 mmol/L (ref 135–145)
TOTAL PROTEIN: 7.6 g/dL (ref 6.5–8.1)

## 2017-05-18 LAB — CBC WITH DIFFERENTIAL/PLATELET
BASOS ABS: 0 10*3/uL (ref 0.0–0.1)
BASOS PCT: 0 %
Eosinophils Absolute: 0.2 10*3/uL (ref 0.0–0.7)
Eosinophils Relative: 3 %
HCT: 31.2 % — ABNORMAL LOW (ref 36.0–46.0)
HEMOGLOBIN: 10 g/dL — AB (ref 12.0–15.0)
Lymphocytes Relative: 26 %
Lymphs Abs: 2.2 10*3/uL (ref 0.7–4.0)
MCH: 27.2 pg (ref 26.0–34.0)
MCHC: 32.1 g/dL (ref 30.0–36.0)
MCV: 84.8 fL (ref 78.0–100.0)
Monocytes Absolute: 0.6 10*3/uL (ref 0.1–1.0)
Monocytes Relative: 7 %
NEUTROS ABS: 5.5 10*3/uL (ref 1.7–7.7)
NEUTROS PCT: 64 %
Platelets: 290 10*3/uL (ref 150–400)
RBC: 3.68 MIL/uL — ABNORMAL LOW (ref 3.87–5.11)
RDW: 17.6 % — ABNORMAL HIGH (ref 11.5–15.5)
WBC: 8.6 10*3/uL (ref 4.0–10.5)

## 2017-05-18 LAB — BRAIN NATRIURETIC PEPTIDE: B Natriuretic Peptide: 58 pg/mL (ref 0.0–100.0)

## 2017-05-21 ENCOUNTER — Ambulatory Visit (INDEPENDENT_AMBULATORY_CARE_PROVIDER_SITE_OTHER): Payer: Medicare Other | Admitting: Family Medicine

## 2017-05-21 ENCOUNTER — Encounter: Payer: Self-pay | Admitting: Family Medicine

## 2017-05-21 ENCOUNTER — Other Ambulatory Visit: Payer: Self-pay

## 2017-05-21 VITALS — BP 96/58 | HR 84 | Temp 98.0°F | Resp 18 | Ht 64.0 in | Wt 139.0 lb

## 2017-05-21 DIAGNOSIS — R600 Localized edema: Secondary | ICD-10-CM | POA: Diagnosis not present

## 2017-05-21 DIAGNOSIS — J810 Acute pulmonary edema: Secondary | ICD-10-CM | POA: Diagnosis not present

## 2017-05-21 MED ORDER — SIMVASTATIN 40 MG PO TABS: 40.0000 mg | ORAL_TABLET | Freq: Every day | ORAL | 3 refills | Status: DC

## 2017-05-21 NOTE — Patient Instructions (Addendum)
Take the furosemide every day Cut your amlodipine in half and take 5 mg a day Get the echocardiogram that was ordered See me after testing

## 2017-05-21 NOTE — Progress Notes (Signed)
Chief Complaint  Patient presents with  . Follow-up    edema   Patient is here for follow-up for pedal edema.  She has been taking her furosemide twice a day.  She states her edema is much better.  Her breathing is better as well. I discussed with her her lab tests which were all normal as expected. I discussed with her chest x-ray.  There is some evidence of right heart enlargement/pressure increase.  I explained to her that this is not unusual with COPD.  There was evidence of pulmonary hypertension on her echocardiogram in 2014.  She is scheduled for another echocardiogram.  She will come and see me after this test is completed.  She may need referral for pulmonary medicine/or cardiology depending on her test results.   Patient Active Problem List   Diagnosis Date Noted  . Tobacco abuse 05/02/2017  . 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 Atherosclerotic peripheral vascular disease with intermittent claudication ([place]) 01/25/2017  . GERD (gastroesophageal reflux disease) 10/13/2015  . Chronic obstructive pulmonary disease ([place]) 07/14/2015  . Obstructive chronic bronchitis without exacerbation ([place]) 04/13/2015  . Headache, tension type, chronic 04/13/2015  . Anxiety state 02/27/2015  . FH: colon cancer 01/15/2015  . Hepatitis C virus infection resolved after antiviral drug therapy 01/15/2015  . Hyperlipidemia 11/26/2008  . UNSPECIFIED ANEMIA 11/26/2008  . MIGRAINE HEADACHE 11/11/2008  . Essential hypertension, benign 11/11/2008  . Osteoarthritis of hands, bilateral 11/11/2008    Outpatient Encounter Medications as of 05/21/2017  Medication Sig  . albuterol (PROVENTIL) (2.5 MG/3ML) 0.083% nebulizer solution INHALE 1 VIAL VIA NEBULIZER EVERY 6 HOURS AS NEEDED FOR WHEEZING AND SHORTNESS OF BREATH  . amLODipine (NORVASC) 10 MG tablet Take 1 tablet (10 mg total) by mouth daily.  . Ascorbic Acid (VITAMIN C PO)  Take 1 tablet by mouth daily.  . budesonide-formoterol (SYMBICORT) 160-4.5 MCG/ACT inhaler Inhale 2 puffs into the lungs 2 (two) times daily.  Marland Kitchen CALCIUM-VITAMIN D PO Take 1 tablet by mouth daily.  . Ferrous Sulfate (IRON) 325 (65 Fe) MG TABS Take 1 tablet by mouth daily.  Marland Kitchen FLUoxetine (PROZAC) 10 MG capsule Take 40 mg by mouth daily.   . furosemide (LASIX) 20 MG tablet Take 1 tablet (20 mg total) 2 (two) times daily by mouth. Morning and noon  . ibuprofen (ADVIL,MOTRIN) 800 MG tablet Take 1 tablet (800 mg total) every 8 (eight) hours as needed by mouth for moderate pain.  Marland Kitchen LORazepam (ATIVAN) 0.5 MG tablet Take 0.5 mg by mouth every 12 (twelve) hours as needed for anxiety.   Marland Kitchen losartan (COZAAR) 50 MG tablet Take 1 tablet (50 mg total) by mouth daily.  . mirtazapine (REMERON) 15 MG tablet Take 15 mg by mouth at bedtime.  . mometasone-formoterol (DULERA) 100-5 MCG/ACT AERO INHALE 2 PUFFS BY MOUTH TWICE DAILY. RINSE MOUTH AFTER EACH USE  . Multiple Vitamin (MULTIVITAMIN) tablet Take 1 tablet by mouth daily.  Marland Kitchen omeprazole (PRILOSEC) 20 MG capsule Take 1 capsule (20 mg total) by mouth daily.  . propranolol (INDERAL) 80 MG tablet TAKE 1 TABLET BY MOUTH TWICE A DAY  . simvastatin (ZOCOR) 40 MG tablet Take 1 tablet (40 mg total) daily at 6 PM by mouth.  . SUMAtriptan (IMITREX) 50 MG tablet Take at beginning of migraine.  Take a second dose in 2 hours if needed  . traZODone (DESYREL) 50 MG tablet Take 75 mg by mouth at bedtime as  needed for sleep.   . [DISCONTINUED] simvastatin (ZOCOR) 20 MG tablet TAKE 1 TABLET BY MOUTH EVERY NIGHT AT BEDTIME FOR CHOLESTEROL  . [DISCONTINUED] simvastatin (ZOCOR) 40 MG tablet Take 1 tablet (40 mg total) by mouth daily at 6 PM.   No facility-administered encounter medications on file as of 05/21/2017.     No Known Allergies  Review of Systems  Constitutional: Positive for fatigue. Negative for activity change, appetite change and unexpected weight change.  HENT:  Negative for congestion, dental problem, postnasal drip and rhinorrhea.   Eyes: Negative for redness and visual disturbance.  Respiratory: Negative for cough, shortness of breath and wheezing.   Cardiovascular: Positive for leg swelling. Negative for chest pain and palpitations.       Improved  Gastrointestinal: Negative for abdominal distention, abdominal pain, constipation and diarrhea.  Genitourinary: Negative for difficulty urinating and frequency.  Musculoskeletal: Positive for arthralgias. Negative for back pain.       Chronic  Neurological: Negative for dizziness and headaches.  Psychiatric/Behavioral: Negative for dysphoric mood and sleep disturbance. The patient is not nervous/anxious.        Under care of psychiatry    BP (!) 96/58 (BP Location: Left Arm, Patient Position: Sitting, Cuff Size: Normal)   Pulse 84   Temp 98 F (36.7 C) (Temporal)   Resp 18   Ht 5\' 4"  (1.626 m)   Wt 139 lb 0.6 oz (63.1 kg)   SpO2 94%   BMI 23.87 kg/m   Physical Exam  Constitutional: She appears well-developed and well-nourished. No distress.  Appears tired.  HENT:  Head: Normocephalic and atraumatic.  Mouth/Throat: Oropharynx is clear and moist.  Eyes: Pupils are equal, round, and reactive to light.  Neck: Normal range of motion. No JVD present.  Cardiovascular: Normal rate, regular rhythm and normal heart sounds.  Pulmonary/Chest: Effort normal. She has no rales.  Lungs are clear  Musculoskeletal:  Pitting edema in ankles, trace  Lymphadenopathy:    She has no cervical adenopathy.  Skin: No rash noted. No erythema.  Psychiatric: She has a normal mood and affect. Her behavior is normal.  Quiet behavior    ASSESSMENT/PLAN:  1. Pedal edema Improved  2. Pulmonary edema, acute (HCC) Improved.  Lungs are clear today.  Discussed that patient has lower blood pressure on the Lasix.  I plan to cut her Norvasc in half, follow her blood pressure, and likely decrease her medication for  blood pressure in general.   Patient Instructions  Take the furosemide every day Cut your amlodipine in half and take 5 mg a day Get the echocardiogram that was ordered See me after testing   Cynthia Everts, MD

## 2017-05-22 ENCOUNTER — Ambulatory Visit: Payer: Medicare Other | Admitting: Family Medicine

## 2017-06-07 ENCOUNTER — Ambulatory Visit (HOSPITAL_COMMUNITY): Admission: RE | Admit: 2017-06-07 | Payer: Medicare Other | Source: Ambulatory Visit

## 2017-06-08 ENCOUNTER — Ambulatory Visit (HOSPITAL_COMMUNITY)
Admission: RE | Admit: 2017-06-08 | Discharge: 2017-06-08 | Disposition: A | Discharge status: Home / Self Care | Payer: Medicare Other | Source: Ambulatory Visit | Attending: Family Medicine | Admitting: Family Medicine

## 2017-06-08 DIAGNOSIS — R600 Localized edema: Secondary | ICD-10-CM

## 2017-06-08 DIAGNOSIS — J810 Acute pulmonary edema: Secondary | ICD-10-CM

## 2017-06-08 DIAGNOSIS — I5030 Unspecified diastolic (congestive) heart failure: Secondary | ICD-10-CM | POA: Insufficient documentation

## 2017-06-08 DIAGNOSIS — I051 Rheumatic mitral insufficiency: Secondary | ICD-10-CM | POA: Insufficient documentation

## 2017-06-08 NOTE — Progress Notes (Signed)
*  PRELIMINARY RESULTS* Echocardiogram 2D Echocardiogram has been performed.  Leavy Cella 06/08/2017, 1:55 PM

## 2017-06-13 ENCOUNTER — Ambulatory Visit (INDEPENDENT_AMBULATORY_CARE_PROVIDER_SITE_OTHER): Payer: Medicare Other | Admitting: Family Medicine

## 2017-06-13 ENCOUNTER — Encounter: Payer: Self-pay | Admitting: Family Medicine

## 2017-06-13 ENCOUNTER — Other Ambulatory Visit: Payer: Self-pay

## 2017-06-13 VITALS — BP 110/74 | HR 80 | Temp 97.3°F | Resp 20 | Ht 64.0 in | Wt 137.0 lb

## 2017-06-13 DIAGNOSIS — G8929 Other chronic pain: Secondary | ICD-10-CM

## 2017-06-13 DIAGNOSIS — R400 Somnolence: Secondary | ICD-10-CM | POA: Diagnosis not present

## 2017-06-13 DIAGNOSIS — Z0000 Encounter for general adult medical examination without abnormal findings: Secondary | ICD-10-CM

## 2017-06-13 DIAGNOSIS — M545 Low back pain, unspecified: Secondary | ICD-10-CM

## 2017-06-13 DIAGNOSIS — F1911 Other psychoactive substance abuse, in remission: Secondary | ICD-10-CM | POA: Diagnosis not present

## 2017-06-13 MED ORDER — MELOXICAM 15 MG PO TABS: 15.0000 mg | ORAL_TABLET | Freq: Every day | ORAL | 0 refills | Status: DC

## 2017-06-13 MED ORDER — AMLODIPINE BESYLATE 5 MG PO TABS: 5.0000 mg | ORAL_TABLET | Freq: Every day | ORAL | 3 refills | Status: DC

## 2017-06-13 NOTE — Progress Notes (Signed)
Chief Complaint  Patient presents with  . Annual Exam   Patient is here for an annual health exam. She was recently seen for pedal edema.  Her lab work was unremarkable.  An echocardiogram was negative.  She has responded to Lasix. I wonder whether the edema could be from her amlodipine 10 mg a day.  I am going to reduce the amlodipine. Medication list is reviewed. Patient appears quite sedated today.  She has her eyes half open and slow with her responses.  She has me to repeat myself a couple of times.  She sees Dr. Keturah Shavers for psychiatry.  She is on Ativan 0.5 twice a day.  She is been on this for some time.  Her sedation outweighs the medication list that I am reviewing.  She denies any other drug use.  She does have a history of prior substance abuse.  Because of her sedation I am asking her for a drug screen today.  She is hesitant to put, but agrees. She tells me that she fell 2 months ago.  She landed on her "rump".  She had low back pain ever since.  She states she has taken so much ibuprofen that it is upsetting her stomach.  She would like pain medication.  I told her that I will not give her any controlled substances, but will give her an anti-inflammatory likely to cause less stomach upset.  I am also ordering an x-ray because of her persistent pain.  Patient Active Problem List   Diagnosis Date Noted  . Somnolence, daytime 06/13/2017  . Tobacco abuse 05/02/2017  . Substance abuse in remission ([place]) 05/02/2017  . 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 Atherosclerotic peripheral vascular disease with intermittent claudication ([place]) 01/25/2017  . GERD (gastroesophageal reflux disease) 10/13/2015  . Chronic obstructive pulmonary disease ([place]) 07/14/2015  . Obstructive chronic bronchitis without exacerbation ([place]) 04/13/2015  . Headache, tension type, chronic 04/13/2015  . Anxiety state 02/27/2015  . FH: colon cancer 01/15/2015  . Hepatitis C virus  infection resolved after antiviral drug therapy 01/15/2015  . Hyperlipidemia 11/26/2008  . UNSPECIFIED ANEMIA 11/26/2008  . MIGRAINE HEADACHE 11/11/2008  . Essential hypertension, benign 11/11/2008  . Osteoarthritis of hands, bilateral 11/11/2008    Outpatient Encounter Medications as of 06/13/2017  Medication Sig  . albuterol (PROVENTIL) (2.5 MG/3ML) 0.083% nebulizer solution INHALE 1 VIAL VIA NEBULIZER EVERY 6 HOURS AS NEEDED FOR WHEEZING AND SHORTNESS OF BREATH  . amLODipine (NORVASC) 5 MG tablet Take 1 tablet (5 mg total) by mouth daily.  . Ascorbic Acid (VITAMIN C PO) Take 1 tablet by mouth daily.  . budesonide-formoterol (SYMBICORT) 160-4.5 MCG/ACT inhaler Inhale 2 puffs into the lungs 2 (two) times daily.  Marland Kitchen CALCIUM-VITAMIN D PO Take 1 tablet by mouth daily.  . Ferrous Sulfate (IRON) 325 (65 Fe) MG TABS Take 1 tablet by mouth daily.  Marland Kitchen FLUoxetine (PROZAC) 10 MG capsule Take 40 mg by mouth daily.   . furosemide (LASIX) 20 MG tablet Take 1 tablet (20 mg total) 2 (two) times daily by mouth. Morning and noon  . LORazepam (ATIVAN) 0.5 MG tablet Take 0.5 mg by mouth every 12 (twelve) hours as needed for anxiety.   Marland Kitchen losartan (COZAAR) 50 MG tablet Take 1 tablet (50 mg total) by mouth daily.  . mirtazapine (REMERON) 15 MG tablet Take 15 mg by mouth at bedtime.  . mometasone-formoterol (DULERA) 100-5 MCG/ACT AERO INHALE 2 PUFFS BY MOUTH TWICE DAILY. RINSE MOUTH AFTER [facility]  USE  . Multiple Vitamin (MULTIVITAMIN) tablet Take 1 tablet by mouth daily.  Marland Kitchen omeprazole (PRILOSEC) 20 MG capsule Take 1 capsule (20 mg total) by mouth daily.  . propranolol (INDERAL) 80 MG tablet TAKE 1 TABLET BY MOUTH TWICE A DAY  . simvastatin (ZOCOR) 40 MG tablet Take 1 tablet (40 mg total) daily at 6 PM by mouth.  . SUMAtriptan (IMITREX) 50 MG tablet Take at beginning of migraine.  Take a second dose in 2 hours if needed  . traZODone (DESYREL) 50 MG tablet Take 75 mg by mouth at bedtime as needed for sleep.   .  [DISCONTINUED] amLODipine (NORVASC) 10 MG tablet Take 1 tablet (10 mg total) by mouth daily.  . meloxicam (MOBIC) 15 MG tablet Take 1 tablet (15 mg total) by mouth daily.   No facility-administered encounter medications on file as of 06/13/2017.     No Known Allergies  Review of Systems  Constitutional: Positive for fatigue. Negative for activity change, appetite change and unexpected weight change.  HENT: Negative for congestion, dental problem, postnasal drip and rhinorrhea.   Eyes: Negative for redness and visual disturbance.  Respiratory: Negative for cough, shortness of breath and wheezing.   Cardiovascular: Positive for leg swelling. Negative for chest pain and palpitations.       Improved  Gastrointestinal: Negative for abdominal distention, abdominal pain, constipation and diarrhea.  Genitourinary: Negative for difficulty urinating and frequency.  Musculoskeletal: Positive for arthralgias and back pain.       Chronic joint complaints, new low back pain, no radiation  Neurological: Negative for dizziness and headaches.  Psychiatric/Behavioral: Negative for dysphoric mood and sleep disturbance. The patient is not nervous/anxious.        Under care of psychiatry    BP 110/74 (BP Location: Left Arm, Patient Position: Sitting, Cuff Size: Normal)   Pulse 80   Temp (!) 97.3 F (36.3 C) (Temporal)   Resp 20   Ht 5\' 4"  (1.626 m)   Wt 137 lb (62.1 kg)   SpO2 96%   BMI 23.52 kg/m   Physical Exam  BP 110/74 (BP Location: Left Arm, Patient Position: Sitting, Cuff Size: Normal)   Pulse 80   Temp (!) 97.3 F (36.3 C) (Temporal)   Resp 20   Ht 5\' 4"  (1.626 m)   Wt 137 lb (62.1 kg)   SpO2 96%   BMI 23.52 kg/m   General Appearance:    Alert, cooperative, no distress, appears stated age.  Appears very sleepy, somnolent.  Slow responses.  Asked me to repeat myself.  Eyes half open.  Head:    Normocephalic, without obvious abnormality, atraumatic  Eyes:    PERRL,  conjunctiva/corneas clear, EOM's intact, fundi    benign, both eyes  Ears:    Normal TM's and external ear canals, both ears  Nose:   Nares normal, septum midline, mucosa normal, no drainage    or sinus tenderness  Throat:   Lips, mucosa, and tongue normal;  gums normal, denture plates  Neck:   Supple, symmetrical, trachea midline, no adenopathy;    thyroid:  no enlargement/tenderness/nodules; no carotid   bruit or JVD  Back:     Symmetric, mild thoracic curvature, ROM normal, no CVA tenderness  Lungs:     Clear to auscultation bilaterally, respirations unlabored  Chest Wall:    No tenderness or deformity   Heart:    Regular rate and rhythm, S1 and S2 normal, no murmur, rub   or gallop  Breast  Exam:    No tenderness, masses, or nipple abnormality  Abdomen:     Soft, non-tender, bowel sounds active all four quadrants,    no masses, no organomegaly  Genitalia:    Normal female without lesion, discharge or tenderness  Extremities:   Extremities normal, atraumatic, no cyanosis or edema.  Bilateral hallux valgus  Pulses:   2+ and symmetric all extremities  Skin:   Skin color, texture, turgor normal, no rashes or lesions  Lymph nodes:   Cervical, supraclavicular, and axillary nodes normal  Neurologic:   Normal strength, sensation and reflexes    throughout     ASSESSMENT/PLAN:  1. Substance abuse in remission [facility]) Prior history  2. Somnolence, daytime More than usual today - Drugs of abuse screen w/o alc, rtn urine-sln  3. PE (physical exam), annual No abnormal findings.  Mild scoliosis.  No tenderness lumbar spine, no focal neurologic defect.  4. Chronic bilateral low back pain without sciatica  - DG Lumbar Spine Complete; Future   Patient Instructions  Stop amlodipine 10 mg, this can cause leg swelling Start amlodipine 5 mg hopefully this will help. We will continue to follow your blood pressure. I have ordered a back x-ray.  This is done at [facility]. We are checking a  urine test for the abdominal and back pain, and for your drowsiness Mammogram is due See me in 6 months for follow-up  Stop ibuprofen Take mobic for back pain with food   Raylene Everts, MD

## 2017-06-13 NOTE — Patient Instructions (Addendum)
Stop amlodipine 10 mg, this can cause leg swelling Start amlodipine 5 mg hopefully this will help. We will continue to follow your blood pressure. I have ordered a back x-ray.  This is done at [facility]. We are checking a urine test for the abdominal and back pain, and for your drowsiness Mammogram is due See me in 6 months for follow-up  Stop ibuprofen Take mobic for back pain with food

## 2017-06-14 ENCOUNTER — Encounter: Payer: Self-pay | Admitting: Gastroenterology

## 2017-06-15 LAB — DRUGS OF ABUSE SCREEN W/O ALC, ROUTINE URINE
AMPHETAMINES (1000 ng/mL SCRN): NEGATIVE
BARBITURATES: NEGATIVE
BENZODIAZEPINES: NEGATIVE
COCAINE METABOLITES: NEGATIVE
MARIJUANA MET (50 NG/ML SCRN): NEGATIVE
METHADONE: NEGATIVE
METHAQUALONE: NEGATIVE
OPIATES: NEGATIVE
PHENCYCLIDINE: NEGATIVE
PROPOXYPHENE: NEGATIVE

## 2017-07-04 ENCOUNTER — Other Ambulatory Visit: Payer: Self-pay | Admitting: Family Medicine

## 2017-07-04 ENCOUNTER — Other Ambulatory Visit: Payer: Self-pay | Admitting: Gastroenterology

## 2017-07-04 NOTE — Telephone Encounter (Signed)
Seen 12 [phone] 18

## 2017-07-20 ENCOUNTER — Other Ambulatory Visit: Payer: Self-pay | Admitting: Family Medicine

## 2017-07-20 NOTE — Telephone Encounter (Signed)
Does she need to continue the lasix?

## 2017-07-30 ENCOUNTER — Other Ambulatory Visit: Payer: Self-pay | Admitting: Family Medicine

## 2017-08-03 ENCOUNTER — Other Ambulatory Visit: Payer: Self-pay | Admitting: Family Medicine

## 2017-08-07 ENCOUNTER — Other Ambulatory Visit: Payer: Self-pay | Admitting: Family Medicine

## 2017-08-09 ENCOUNTER — Telehealth: Payer: Self-pay | Admitting: *Deleted

## 2017-08-09 MED ORDER — FUROSEMIDE 20 MG PO TABS: ORAL_TABLET | ORAL | 0 refills | Status: DC

## 2017-08-09 NOTE — Telephone Encounter (Signed)
Patient came in and scheduled an appointment with Dr Meda Coffee on Monday at 10:40. Patient states she needs her furosemide 20mg  to be refilled, patient states her feet are swelling very bad.   [employer].  Patient contact number is [phone]

## 2017-08-09 NOTE — Telephone Encounter (Signed)
May refill 

## 2017-08-13 ENCOUNTER — Other Ambulatory Visit: Payer: Self-pay

## 2017-08-13 ENCOUNTER — Ambulatory Visit: Payer: Medicare Other | Admitting: Family Medicine

## 2017-08-13 ENCOUNTER — Encounter: Payer: Self-pay | Admitting: Family Medicine

## 2017-08-13 VITALS — BP 112/70 | HR 80 | Temp 97.3°F | Resp 18 | Ht 64.0 in | Wt 138.0 lb

## 2017-08-13 DIAGNOSIS — R600 Localized edema: Secondary | ICD-10-CM | POA: Diagnosis not present

## 2017-08-13 DIAGNOSIS — I10 Essential (primary) hypertension: Secondary | ICD-10-CM | POA: Diagnosis not present

## 2017-08-13 MED ORDER — MELOXICAM 15 MG PO TABS: 15.0000 mg | ORAL_TABLET | Freq: Every day | ORAL | 3 refills | Status: DC

## 2017-08-13 MED ORDER — FUROSEMIDE 20 MG PO TABS: ORAL_TABLET | ORAL | 0 refills | Status: DC

## 2017-08-13 NOTE — Patient Instructions (Signed)
Stop the amlodipine Take the increased dose of furosemide ( lasix ) once a day Come back in 2 weeks for a visit to check ankles and blood pressure

## 2017-08-13 NOTE — Progress Notes (Signed)
Chief Complaint  Patient presents with  . Foot Swelling  Patient called for an acute appointment.  She has an increase in her swelling again.  She has been seen for pedal edema 3 times in the last several months.  Her blood work has been normal.  Her echocardiogram has been normal.  At her last visit I tried to decrease her amlodipine from 10 to 5 mg a day.  Apparently she did not comprehend this message and is still been taking 10 mg a day.  Today I am asking her to stop the amlodipine to see whether this is contributing to her pedal edema.  She takes Lasix as needed.  Swelling this time has been present for about 2 weeks.  She discussed the Lasix filled 3 or 4 days ago.  She states that has not yet worked.  She is not restricting the salt in her diet.  She otherwise feels well.  Her blood pressure is well controlled.   Patient Active Problem List   Diagnosis Date Noted  . Somnolence, daytime 06/13/2017  . Tobacco abuse 05/02/2017  . 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 Atherosclerotic peripheral vascular disease with intermittent claudication ([place]) 01/25/2017  . GERD (gastroesophageal reflux disease) 10/13/2015  . Chronic obstructive pulmonary disease ([place]) 07/14/2015  . Obstructive chronic bronchitis without exacerbation ([place]) 04/13/2015  . Headache, tension type, chronic 04/13/2015  . Anxiety state 02/27/2015  . FH: colon cancer 01/15/2015  . Hepatitis C virus infection resolved after antiviral drug therapy 01/15/2015  . Hyperlipidemia 11/26/2008  . UNSPECIFIED ANEMIA 11/26/2008  . MIGRAINE HEADACHE 11/11/2008  . Essential hypertension, benign 11/11/2008  . Osteoarthritis of hands, bilateral 11/11/2008    Outpatient Encounter Medications as of 08/13/2017  Medication Sig  . albuterol (PROVENTIL) (2.5 MG/3ML) 0.083% nebulizer solution INHALE 1 VIAL VIA NEBULIZER EVERY 6 HOURS AS NEEDED FOR WHEEZING AND  SHORTNESS OF BREATH  . amLODipine (NORVASC) 5 MG tablet Take 1 tablet (5 mg total) by mouth daily.  . Ascorbic Acid (VITAMIN C PO) Take 1 tablet by mouth daily.  . budesonide-formoterol (SYMBICORT) 160-4.5 MCG/ACT inhaler Inhale 2 puffs into the lungs 2 (two) times daily.  Marland Kitchen CALCIUM-VITAMIN D PO Take 1 tablet by mouth daily.  . Ferrous Sulfate (IRON) 325 (65 Fe) MG TABS Take 1 tablet by mouth daily.  Marland Kitchen FLUoxetine (PROZAC) 10 MG capsule Take 40 mg by mouth daily.   . furosemide (LASIX) 20 MG tablet TAKE ONE TABLET BY mouth daily  . LORazepam (ATIVAN) 0.5 MG tablet Take 0.5 mg by mouth every 12 (twelve) hours as needed for anxiety.   Marland Kitchen losartan (COZAAR) 50 MG tablet Take 1 tablet (50 mg total) by mouth daily.  . meloxicam (MOBIC) 15 MG tablet Take 1 tablet (15 mg total) by mouth daily.  . mirtazapine (REMERON) 15 MG tablet Take 15 mg by mouth at bedtime.  . mometasone-formoterol (DULERA) 100-5 MCG/ACT AERO INHALE 2 PUFFS BY MOUTH TWICE DAILY. RINSE MOUTH AFTER EACH USE  . Multiple Vitamin (MULTIVITAMIN) tablet Take 1 tablet by mouth daily.  Marland Kitchen omeprazole (PRILOSEC) 20 MG capsule TAKE 1 CAPSULE BY MOUTH ONCE DAILY BEFORE A MEAL  . propranolol (INDERAL) 80 MG tablet TAKE 1 TABLET BY MOUTH TWICE A DAY  . simvastatin (ZOCOR) 40 MG tablet Take 1 tablet (40 mg total) daily at 6 PM by mouth.  . SUMAtriptan (IMITREX) 50 MG tablet TAKE AT BEGINNING OF MIGRAINE. TAKE A SECOND  DOSE IN 2 HOURS IF NEEDED.  Marland Kitchen traZODone (DESYREL) 50 MG tablet Take 75 mg by mouth at bedtime as needed for sleep.    No facility-administered encounter medications on file as of 08/13/2017.     No Known Allergies  Review of Systems  Constitutional: Positive for fatigue. Negative for activity change, appetite change and unexpected weight change.       Chronic complaint  HENT: Negative for congestion, dental problem, postnasal drip and rhinorrhea.   Eyes: Negative for redness and visual disturbance.  Respiratory: Negative for  cough, shortness of breath and wheezing.   Cardiovascular: Positive for leg swelling. Negative for chest pain and palpitations.       Improved  Gastrointestinal: Negative for abdominal distention, abdominal pain, constipation and diarrhea.  Genitourinary: Negative for difficulty urinating and frequency.  Musculoskeletal: Positive for arthralgias. Negative for back pain.       Chronic  Neurological: Negative for dizziness and headaches.  Psychiatric/Behavioral: Positive for dysphoric mood. Negative for sleep disturbance. The patient is not nervous/anxious.        Under care of psychiatry     BP 112/70 (BP Location: Left Arm, Patient Position: Sitting, Cuff Size: Normal)   Pulse 80   Temp (!) 97.3 F (36.3 C) (Temporal)   Resp 18   Ht 5\' 4"  (1.626 m)   Wt 138 lb 0.6 oz (62.6 kg)   SpO2 98%   BMI 23.69 kg/m   Physical Exam  Constitutional: She appears well-developed and well-nourished. No distress.  Appears tired.  HENT:  Head: Normocephalic and atraumatic.  Mouth/Throat: Oropharynx is clear and moist.  Eyes: Pupils are equal, round, and reactive to light.  Neck: Normal range of motion. No JVD present.  Cardiovascular: Normal rate, regular rhythm and normal heart sounds.  Pulmonary/Chest: Effort normal. She has no rales.  Lungs are clear  Musculoskeletal: Normal range of motion. She exhibits edema.  Pitting edema in ankles, 1-2+ with trace pitting to tibial tuberosity.  Feet with bilateral hallux valgus  Lymphadenopathy:    She has no cervical adenopathy.  Skin: No rash noted. No erythema.  Psychiatric: She has a normal mood and affect. Her behavior is normal.  Quiet behavior    ASSESSMENT/PLAN:  1. Pedal edema Likely due to amlodipine.  2. Essential hypertension, benign Controlled   Patient Instructions  Stop the amlodipine Take the increased dose of furosemide ( lasix ) once a day Come back in 2 weeks for a visit to check ankles and blood pressure   Raylene Everts, MD

## 2017-08-24 ENCOUNTER — Other Ambulatory Visit: Payer: Self-pay

## 2017-08-27 ENCOUNTER — Encounter: Payer: Self-pay | Admitting: Family Medicine

## 2017-08-27 ENCOUNTER — Ambulatory Visit: Payer: Medicare Other | Admitting: Family Medicine

## 2017-08-27 ENCOUNTER — Other Ambulatory Visit: Payer: Self-pay

## 2017-08-27 VITALS — BP 168/90 | HR 68 | Temp 97.8°F | Resp 20 | Ht 64.0 in | Wt 138.0 lb

## 2017-08-27 DIAGNOSIS — Z1231 Encounter for screening mammogram for malignant neoplasm of breast: Secondary | ICD-10-CM | POA: Diagnosis not present

## 2017-08-27 DIAGNOSIS — F17210 Nicotine dependence, cigarettes, uncomplicated: Secondary | ICD-10-CM | POA: Diagnosis not present

## 2017-08-27 DIAGNOSIS — Z1239 Encounter for other screening for malignant neoplasm of breast: Secondary | ICD-10-CM

## 2017-08-27 DIAGNOSIS — I10 Essential (primary) hypertension: Secondary | ICD-10-CM | POA: Diagnosis not present

## 2017-08-27 DIAGNOSIS — Z780 Asymptomatic menopausal state: Secondary | ICD-10-CM | POA: Diagnosis not present

## 2017-08-27 MED ORDER — FUROSEMIDE 20 MG PO TABS: ORAL_TABLET | ORAL | 0 refills | Status: DC

## 2017-08-27 MED ORDER — LOSARTAN POTASSIUM 100 MG PO TABS: 100.0000 mg | ORAL_TABLET | Freq: Every day | ORAL | 1 refills | Status: DC

## 2017-08-27 MED ORDER — SUMATRIPTAN SUCCINATE 50 MG PO TABS: ORAL_TABLET | ORAL | 0 refills | Status: DC

## 2017-08-27 NOTE — Progress Notes (Signed)
Chief Complaint  Patient presents with  . Follow-up  here for follow up No new complaint She had a visit from a home health nurse from her insurance company.  They recommend a CT scan of her chest for smoking/early cancer detection.  She only smokes 2 cigarettes a day, now, but upon questioning she states she smoked a pack a day up into a year ago.  She definitely has more than 30 pack-years accumulated.  She has no symptoms, mild shortness of breath from her COPD but no cough, sputum, hemoptysis.  Is stable. Discussed Pap smears.  She is not sure when she had a last.  She thinks it was 2014, which would be 5 years ago.  She does need them every 5 years.  I told her to come back next time for a Pap We also discussed her immunizations.  She states that she never did have the hepatitis B immunizations that were recommended for her.  These were recommended when she was under treatment for hepatitis C.  She states that they were too expensive.  She likewise has not had the shingles shot for the same reason. A bone density was recommended.  Patient will be 67 next month so this is ordered.  She is due for mammogram at the same time.  Her pedal edema has resolved.  I did reduce her Norvasc to 5 mg a day.  Unfortunately her blood pressure is up.  I am going to double her Cozaar to 100 mg a day.  Will check her blood pressure in a couple weeks.  Patient Active Problem List   Diagnosis Date Noted  . Somnolence, daytime 06/13/2017  . Tobacco abuse 05/02/2017  . 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 Atherosclerotic peripheral vascular disease with intermittent claudication ([place]) 01/25/2017  . GERD (gastroesophageal reflux disease) 10/13/2015  . Chronic obstructive pulmonary disease ([place]) 07/14/2015  . Obstructive chronic bronchitis without exacerbation ([place]) 04/13/2015  . Headache, tension type, chronic 04/13/2015  . Anxiety  state 02/27/2015  . FH: colon cancer 01/15/2015  . Hepatitis C virus infection resolved after antiviral drug therapy 01/15/2015  . Hyperlipidemia 11/26/2008  . UNSPECIFIED ANEMIA 11/26/2008  . MIGRAINE HEADACHE 11/11/2008  . Essential hypertension, benign 11/11/2008  . Osteoarthritis of hands, bilateral 11/11/2008    Outpatient Encounter Medications as of 08/27/2017  Medication Sig  . albuterol (PROVENTIL) (2.5 MG/3ML) 0.083% nebulizer solution INHALE 1 VIAL VIA NEBULIZER EVERY 6 HOURS AS NEEDED FOR WHEEZING AND SHORTNESS OF BREATH  . amLODipine (NORVASC) 5 MG tablet Take 1 tablet (5 mg total) by mouth daily.  . Ascorbic Acid (VITAMIN C PO) Take 1 tablet by mouth daily.  . budesonide-formoterol (SYMBICORT) 160-4.5 MCG/ACT inhaler Inhale 2 puffs into the lungs 2 (two) times daily.  Marland Kitchen CALCIUM-VITAMIN D PO Take 1 tablet by mouth daily.  . Ferrous Sulfate (IRON) 325 (65 Fe) MG TABS Take 1 tablet by mouth daily.  . furosemide (LASIX) 20 MG tablet TAKE ONE TABLET BY mouth daily  . LORazepam (ATIVAN) 0.5 MG tablet Take 0.5 mg by mouth every 12 (twelve) hours as needed for anxiety.   Marland Kitchen losartan (COZAAR) 100 MG tablet Take 1 tablet (100 mg total) by mouth daily.  . meloxicam (MOBIC) 15 MG tablet Take 1 tablet (15 mg total) by mouth daily.  . mirtazapine (REMERON) 15 MG tablet Take 15 mg by mouth at bedtime.  . mometasone-formoterol (DULERA) 100-5 MCG/ACT AERO INHALE 2  PUFFS BY MOUTH TWICE DAILY. RINSE MOUTH AFTER EACH USE  . Multiple Vitamin (MULTIVITAMIN) tablet Take 1 tablet by mouth daily.  Marland Kitchen omeprazole (PRILOSEC) 20 MG capsule TAKE 1 CAPSULE BY MOUTH ONCE DAILY BEFORE A MEAL  . propranolol (INDERAL) 80 MG tablet TAKE 1 TABLET BY MOUTH TWICE A DAY  . simvastatin (ZOCOR) 40 MG tablet Take 1 tablet (40 mg total) daily at 6 PM by mouth.  . SUMAtriptan (IMITREX) 50 MG tablet May repeat in 2 hours if headache persists or recurs.  . traZODone (DESYREL) 50 MG tablet Take 75 mg by mouth at bedtime as  needed for sleep.   Marland Kitchen FLUoxetine (PROZAC) 40 MG capsule    No facility-administered encounter medications on file as of 08/27/2017.     No Known Allergies  Review of Systems  Constitutional: Positive for fatigue. Negative for activity change, appetite change and unexpected weight change.       Chronic complaint  HENT: Negative for congestion, dental problem, postnasal drip and rhinorrhea.   Eyes: Negative for redness and visual disturbance.  Respiratory: Negative for cough, shortness of breath and wheezing.   Cardiovascular: Negative for chest pain, palpitations and leg swelling.       Improved  Gastrointestinal: Negative for abdominal distention, abdominal pain, constipation and diarrhea.  Genitourinary: Negative for difficulty urinating and frequency.  Musculoskeletal: Positive for arthralgias and neck pain. Negative for back pain.       Chronic  Neurological: Negative for dizziness and headaches.  Psychiatric/Behavioral: Positive for dysphoric mood. Negative for sleep disturbance. The patient is not nervous/anxious.        Under care of psychiatry    BP (!) 168/90 (BP Location: Left Arm, Patient Position: Sitting, Cuff Size: Normal)   Pulse 68   Temp 97.8 F (36.6 C) (Temporal)   Resp 20   Ht 5\' 4"  (1.626 m)   Wt 138 lb 0.6 oz (62.6 kg)   SpO2 94%   BMI 23.69 kg/m   Physical Exam  Constitutional: She appears well-developed and well-nourished. No distress.  Appears tired.  HENT:  Head: Normocephalic and atraumatic.  Mouth/Throat: Oropharynx is clear and moist.  Eyes: Pupils are equal, round, and reactive to light.  Neck: Normal range of motion. No JVD present.  Cardiovascular: Normal rate, regular rhythm and normal heart sounds.  Pulmonary/Chest: Effort normal. She has no rales.  Lungs are clear  Musculoskeletal: Normal range of motion. She exhibits no edema.  No edema  Lymphadenopathy:    She has no cervical adenopathy.  Skin: No rash noted. No erythema.    Psychiatric: She has a normal mood and affect. Her behavior is normal.  Quiet behavior    ASSESSMENT/PLAN:  1. Screening for breast cancer Ordered - MM Digital Screening; Future  2. Post-menopausal Ordered - DG Bone Density; Future  3. Essential hypertension, benign Not well controlled.  I reduced her Norvasc.  We will increase her Cozaar and follow.  4. Smokes with greater than 30 pack year history Discussed screening.  She desired a CT chest. - CT CHEST LUNG CA SCREEN LOW DOSE W/O CM; Future   Patient Instructions  Come back for a physical examination and Pap smear I am increasing your Cozaar to 100 mg a day.  Take daily I have ordered a CAT scan of your chest, a mammogram, and a bone density test No other changes in medicines Return in 3 months   Raylene Everts, MD

## 2017-08-27 NOTE — Patient Instructions (Signed)
Come back for a physical examination and Pap smear I am increasing your Cozaar to 100 mg a day.  Take daily I have ordered a CAT scan of your chest, a mammogram, and a bone density test No other changes in medicines Return in 3 months

## 2017-09-17 ENCOUNTER — Encounter: Payer: Self-pay | Admitting: Family Medicine

## 2017-09-17 ENCOUNTER — Other Ambulatory Visit: Payer: Self-pay | Admitting: Family Medicine

## 2017-09-19 ENCOUNTER — Telehealth: Payer: Self-pay

## 2017-09-19 NOTE — Telephone Encounter (Signed)
Called and advised patient that CT scan was scheduled, March 26th at 3:00, check in time 2:45 at [facility]. Patient verbalized understanding.

## 2017-10-02 ENCOUNTER — Ambulatory Visit (HOSPITAL_COMMUNITY): Payer: Medicare Other

## 2017-10-04 ENCOUNTER — Other Ambulatory Visit: Payer: Self-pay | Admitting: Physician Assistant

## 2017-10-04 ENCOUNTER — Other Ambulatory Visit: Payer: Self-pay | Admitting: Family Medicine

## 2017-10-10 ENCOUNTER — Other Ambulatory Visit: Payer: Self-pay | Admitting: Family Medicine

## 2017-10-16 ENCOUNTER — Ambulatory Visit (HOSPITAL_COMMUNITY)
Admission: RE | Admit: 2017-10-16 | Discharge: 2017-10-16 | Disposition: A | Discharge status: Home / Self Care | Payer: Medicare Other | Source: Ambulatory Visit | Attending: Family Medicine | Admitting: Family Medicine

## 2017-10-16 DIAGNOSIS — J432 Centrilobular emphysema: Secondary | ICD-10-CM | POA: Diagnosis not present

## 2017-10-16 DIAGNOSIS — R918 Other nonspecific abnormal finding of lung field: Secondary | ICD-10-CM | POA: Insufficient documentation

## 2017-10-16 DIAGNOSIS — N2889 Other specified disorders of kidney and ureter: Secondary | ICD-10-CM | POA: Insufficient documentation

## 2017-10-16 DIAGNOSIS — I700 Atherosclerosis of aorta: Secondary | ICD-10-CM | POA: Diagnosis not present

## 2017-10-16 DIAGNOSIS — J438 Other emphysema: Secondary | ICD-10-CM | POA: Insufficient documentation

## 2017-10-16 DIAGNOSIS — F17210 Nicotine dependence, cigarettes, uncomplicated: Secondary | ICD-10-CM | POA: Diagnosis present

## 2017-10-16 DIAGNOSIS — I2510 Atherosclerotic heart disease of native coronary artery without angina pectoris: Secondary | ICD-10-CM | POA: Insufficient documentation

## 2017-10-16 DIAGNOSIS — J984 Other disorders of lung: Secondary | ICD-10-CM | POA: Diagnosis not present

## 2017-10-16 DIAGNOSIS — Z122 Encounter for screening for malignant neoplasm of respiratory organs: Secondary | ICD-10-CM | POA: Insufficient documentation

## 2017-10-16 DIAGNOSIS — R911 Solitary pulmonary nodule: Secondary | ICD-10-CM | POA: Diagnosis not present

## 2017-10-18 ENCOUNTER — Encounter: Payer: Self-pay | Admitting: Family Medicine

## 2017-10-19 ENCOUNTER — Encounter: Payer: Self-pay | Admitting: Family Medicine

## 2017-10-19 ENCOUNTER — Other Ambulatory Visit: Payer: Self-pay

## 2017-10-19 ENCOUNTER — Ambulatory Visit (INDEPENDENT_AMBULATORY_CARE_PROVIDER_SITE_OTHER): Payer: Medicare Other | Admitting: Family Medicine

## 2017-10-19 VITALS — BP 130/68 | HR 91 | Temp 98.5°F | Resp 12 | Ht 64.0 in | Wt 133.1 lb

## 2017-10-19 DIAGNOSIS — L989 Disorder of the skin and subcutaneous tissue, unspecified: Secondary | ICD-10-CM

## 2017-10-19 MED ORDER — HYDROCODONE-ACETAMINOPHEN 7.5-325 MG PO TABS: 1.0000 | ORAL_TABLET | Freq: Four times a day (QID) | ORAL | 0 refills | Status: DC | PRN

## 2017-10-19 NOTE — Progress Notes (Signed)
Chief Complaint  Patient presents with  . Arm Pain    lower left arm. has been there >1 yr. recently became red and painful   Lump on arm for over a year In last few days it has gotten larger and painful Wishes removal  Patient Active Problem List   Diagnosis Date Noted  . Somnolence, daytime 06/13/2017  . Tobacco abuse 05/02/2017  . Substance abuse in remission ([place]) 05/02/2017  . 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 Atherosclerotic peripheral vascular disease with intermittent claudication ([place]) 01/25/2017  . GERD (gastroesophageal reflux disease) 10/13/2015  . Chronic obstructive pulmonary disease ([place]) 07/14/2015  . Obstructive chronic bronchitis without exacerbation ([place]) 04/13/2015  . Headache, tension type, chronic 04/13/2015  . Anxiety state 02/27/2015  . FH: colon cancer 01/15/2015  . Hepatitis C virus infection resolved after antiviral drug therapy 01/15/2015  . Hyperlipidemia 11/26/2008  . UNSPECIFIED ANEMIA 11/26/2008  . MIGRAINE HEADACHE 11/11/2008  . Essential hypertension, benign 11/11/2008  . Osteoarthritis of hands, bilateral 11/11/2008    Outpatient Encounter Medications as of 10/19/2017  Medication Sig  . albuterol (PROVENTIL) (2.5 MG/3ML) 0.083% nebulizer solution INHALE 1 VIAL VIA NEBULIZER EVERY 6 HOURS AS NEEDED FOR WHEEZING AND SHORTNESS OF BREATH  . amLODipine (NORVASC) 5 MG tablet Take 1 tablet (5 mg total) by mouth daily.  . Ascorbic Acid (VITAMIN C PO) Take 1 tablet by mouth daily.  . budesonide-formoterol (SYMBICORT) 160-4.5 MCG/ACT inhaler Inhale 2 puffs into the lungs 2 (two) times daily.  . busPIRone (BUSPAR) 10 MG tablet Take 10 mg by mouth daily.  Marland Kitchen CALCIUM-VITAMIN D PO Take 1 tablet by mouth daily.  . Ferrous Sulfate (IRON) 325 (65 Fe) MG TABS Take 1 tablet by mouth daily.  Marland Kitchen FLUoxetine (PROZAC) 40 MG capsule Take 40 mg by mouth daily.   . furosemide (LASIX) 20 MG tablet TAKE ONE TABLET BY mouth daily  . LORazepam  (ATIVAN) 0.5 MG tablet Take 0.5 mg by mouth every 12 (twelve) hours as needed for anxiety.   Marland Kitchen losartan (COZAAR) 100 MG tablet Take 1 tablet (100 mg total) by mouth daily.  . meloxicam (MOBIC) 15 MG tablet Take 1 tablet (15 mg total) by mouth daily.  . mirtazapine (REMERON) 15 MG tablet Take 15 mg by mouth at bedtime.  . mometasone-formoterol (DULERA) 100-5 MCG/ACT AERO INHALE 2 PUFFS BY MOUTH TWICE DAILY. RINSE MOUTH AFTER EACH USE  . Multiple Vitamin (MULTIVITAMIN) tablet Take 1 tablet by mouth daily.  Marland Kitchen omeprazole (PRILOSEC) 20 MG capsule TAKE 1 CAPSULE BY MOUTH ONCE DAILY BEFORE A MEAL  . propranolol (INDERAL) 80 MG tablet TAKE 1 TABLET BY MOUTH TWICE A DAY  . simvastatin (ZOCOR) 40 MG tablet Take 1 tablet (40 mg total) daily at 6 PM by mouth.  . SUMAtriptan (IMITREX) 50 MG tablet TAKE AT BEGINNING OF MIGRAINE. TAKE A SECOND DOSE IN 2 HOURS IF NEEDED.  Marland Kitchen traZODone (DESYREL) 50 MG tablet Take 75 mg by mouth at bedtime as needed for sleep.   Marland Kitchen HYDROcodone-acetaminophen (NORCO) 7.5-325 MG tablet Take 1 tablet by mouth every 6 (six) hours as needed for moderate pain.   No facility-administered encounter medications on file as of 10/19/2017.     Allergies  Allergen Reactions  . Asa [Aspirin] Other (See Comments)    REACTION: Stomach upset    Review of Systems  Constitutional: Positive for fatigue. Negative for activity change, appetite change and unexpected weight change.       Chronic complaint  HENT: Negative for congestion, dental problem, postnasal drip and rhinorrhea.   Eyes: Negative for redness and visual disturbance.  Respiratory: Negative for cough, shortness of breath and wheezing.   Cardiovascular: Negative for chest pain, palpitations and leg swelling.  Gastrointestinal: Negative for abdominal distention, abdominal pain, constipation and diarrhea.  Genitourinary: Negative for difficulty urinating and frequency.  Musculoskeletal: Positive for arthralgias and neck pain.  Negative for back pain.       Chronic  Neurological: Negative for dizziness and headaches.  Psychiatric/Behavioral: Positive for dysphoric mood. Negative for sleep disturbance. The patient is not nervous/anxious.        Under care of psychiatry    Physical Exam  Constitutional: She is oriented to person, place, and time. She appears well-developed and well-nourished. No distress.  Neurological: She is alert and oriented to person, place, and time.  Skin: Skin is warm and dry. No rash noted.  Psychiatric: She has a normal mood and affect. Her behavior is normal.  Left forearm: [place], very tender 15 mm sub cut nodule, firm  Time out Consent Area cleansed with betadine Area anesthestized with 4 cc 1% lidocaine Single incision made with 11 blade Area explored with forceps, small pieces or dark blood/hematoma removed No mass identified Closed with sutures #2 interrupted 4-0 ethilon Wound care discussed  BP 130/68   Pulse 91   Temp 98.5 F (36.9 C) (Oral)   Resp 12   Ht 5\' 4"  (1.626 m)   Wt 133 lb 1.9 oz (60.4 kg)   SpO2 95%   BMI 22.85 kg/m     ASSESSMENT/PLAN:  1. Skin lesion of left arm hematoma   Patient Instructions  Go home and put an ice pack on the arm over the wrap I anticipate some bruising  May change the dressing tomorrow and use just a band aid  See me Monday for a wound check  Pain medicine sent to your pharmacy     Raylene Everts, MD

## 2017-10-19 NOTE — Patient Instructions (Addendum)
Go home and put an ice pack on the arm over the wrap I anticipate some bruising  May change the dressing tomorrow and use just a band aid  See me Monday for a wound check  Pain medicine sent to your pharmacy

## 2017-10-22 ENCOUNTER — Other Ambulatory Visit: Payer: Self-pay

## 2017-10-22 ENCOUNTER — Ambulatory Visit (INDEPENDENT_AMBULATORY_CARE_PROVIDER_SITE_OTHER): Payer: Medicare Other | Admitting: Family Medicine

## 2017-10-22 ENCOUNTER — Encounter: Payer: Self-pay | Admitting: Family Medicine

## 2017-10-22 VITALS — BP 132/68 | HR 91 | Temp 98.6°F | Resp 18 | Ht 64.0 in | Wt 133.0 lb

## 2017-10-22 DIAGNOSIS — J069 Acute upper respiratory infection, unspecified: Secondary | ICD-10-CM | POA: Diagnosis not present

## 2017-10-22 DIAGNOSIS — B9789 Other viral agents as the cause of diseases classified elsewhere: Secondary | ICD-10-CM

## 2017-10-22 DIAGNOSIS — J441 Chronic obstructive pulmonary disease with (acute) exacerbation: Secondary | ICD-10-CM | POA: Diagnosis not present

## 2017-10-22 DIAGNOSIS — Z4889 Encounter for other specified surgical aftercare: Secondary | ICD-10-CM

## 2017-10-22 MED ORDER — BENZONATATE 200 MG PO CAPS: 200.0000 mg | ORAL_CAPSULE | Freq: Three times a day (TID) | ORAL | 0 refills | Status: DC | PRN

## 2017-10-22 MED ORDER — PREDNISONE 20 MG PO TABS: 20.0000 mg | ORAL_TABLET | Freq: Two times a day (BID) | ORAL | 0 refills | Status: DC

## 2017-10-22 MED ORDER — ONDANSETRON 8 MG PO TBDP: 8.0000 mg | ORAL_TABLET | Freq: Three times a day (TID) | ORAL | 0 refills | Status: DC | PRN

## 2017-10-22 NOTE — Patient Instructions (Signed)
Rest push fluids Take the prednisone for 5 days Take the cough pill as needed Use zofran as needed nausea / vomiting Call if not improving in 2-3 days  Stitches come out at 7-10 days

## 2017-10-22 NOTE — Progress Notes (Signed)
Chief Complaint  Patient presents with  . skin lesion of left arm    follow arm. has a cold, but husband has been sick  . Cough   Patient is on the schedule for suture check.  Her sutures look great. While here she would like to be seen for a respiratory illness.  She states that she has been sick since Friday.  She has a terrible cough, and is coughing uncontrollably.  She is coughing up green sputum.  She is more short of breath.  She has been using her nebulizer more often.  She states she is also vomiting, several times a day.  She can keep down water.  She is vomiting regardless of her cough.  No diarrhea.  No abdominal pain.  No ear pressure or pain.  Mild postnasal drip.  She states she is having sweats and chills.  She states it came on suddenly and she is feeling very tired and achy.  Because of this influenza test was done, came back negative.  She has underlying significant COPD.  She continues to smoke cigarettes.   Patient Active Problem List   Diagnosis Date Noted  . Somnolence, daytime 06/13/2017  . Tobacco abuse 05/02/2017  . Substance abuse in remission ([place]) 05/02/2017  . 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 Atherosclerotic peripheral vascular disease with intermittent claudication ([place]) 01/25/2017  . GERD (gastroesophageal reflux disease) 10/13/2015  . Chronic obstructive pulmonary disease ([place]) 07/14/2015  . Obstructive chronic bronchitis without exacerbation ([place]) 04/13/2015  . Headache, tension type, chronic 04/13/2015  . Anxiety state 02/27/2015  . FH: colon cancer 01/15/2015  . Hepatitis C virus infection resolved after antiviral drug therapy 01/15/2015  . Hyperlipidemia 11/26/2008  . UNSPECIFIED ANEMIA 11/26/2008  . MIGRAINE HEADACHE 11/11/2008  . Essential hypertension, benign 11/11/2008  . Osteoarthritis of hands, bilateral 11/11/2008    Outpatient Encounter Medications as of 10/22/2017  Medication Sig  . albuterol (PROVENTIL)  (2.5 MG/3ML) 0.083% nebulizer solution INHALE 1 VIAL VIA NEBULIZER EVERY 6 HOURS AS NEEDED FOR WHEEZING AND SHORTNESS OF BREATH  . amLODipine (NORVASC) 5 MG tablet Take 1 tablet (5 mg total) by mouth daily.  . Ascorbic Acid (VITAMIN C PO) Take 1 tablet by mouth daily.  . budesonide-formoterol (SYMBICORT) 160-4.5 MCG/ACT inhaler Inhale 2 puffs into the lungs 2 (two) times daily.  . busPIRone (BUSPAR) 10 MG tablet Take 10 mg by mouth daily.  Marland Kitchen CALCIUM-VITAMIN D PO Take 1 tablet by mouth daily.  . Ferrous Sulfate (IRON) 325 (65 Fe) MG TABS Take 1 tablet by mouth daily.  Marland Kitchen FLUoxetine (PROZAC) 40 MG capsule Take 40 mg by mouth daily.   . furosemide (LASIX) 20 MG tablet TAKE ONE TABLET BY mouth daily  . HYDROcodone-acetaminophen (NORCO) 7.5-325 MG tablet Take 1 tablet by mouth every 6 (six) hours as needed for moderate pain.  Marland Kitchen LORazepam (ATIVAN) 0.5 MG tablet Take 0.5 mg by mouth every 12 (twelve) hours as needed for anxiety.   Marland Kitchen losartan (COZAAR) 100 MG tablet Take 1 tablet (100 mg total) by mouth daily.  . meloxicam (MOBIC) 15 MG tablet Take 1 tablet (15 mg total) by mouth daily.  . mirtazapine (REMERON) 15 MG tablet Take 15 mg by mouth at bedtime.  . mometasone-formoterol (DULERA) 100-5 MCG/ACT AERO INHALE 2 PUFFS BY MOUTH TWICE DAILY. RINSE MOUTH AFTER EACH USE  . Multiple Vitamin (MULTIVITAMIN) tablet Take 1 tablet by mouth daily.  Marland Kitchen omeprazole (PRILOSEC) 20 MG capsule TAKE 1 CAPSULE BY  MOUTH ONCE DAILY BEFORE A MEAL  . propranolol (INDERAL) 80 MG tablet TAKE 1 TABLET BY MOUTH TWICE A DAY  . simvastatin (ZOCOR) 40 MG tablet Take 1 tablet (40 mg total) daily at 6 PM by mouth.  . SUMAtriptan (IMITREX) 50 MG tablet TAKE AT BEGINNING OF MIGRAINE. TAKE A SECOND DOSE IN 2 HOURS IF NEEDED.  Marland Kitchen traZODone (DESYREL) 50 MG tablet Take 75 mg by mouth at bedtime as needed for sleep.   . benzonatate (TESSALON) 200 MG capsule Take 1 capsule (200 mg total) by mouth 3 (three) times daily as needed for cough.  .  ondansetron (ZOFRAN-ODT) 8 MG disintegrating tablet Take 1 tablet (8 mg total) by mouth every 8 (eight) hours as needed for nausea or vomiting.  . predniSONE (DELTASONE) 20 MG tablet Take 1 tablet (20 mg total) by mouth 2 (two) times daily with a meal.   No facility-administered encounter medications on file as of 10/22/2017.     Allergies  Allergen Reactions  . Asa [Aspirin] Other (See Comments)    REACTION: Stomach upset    Review of Systems  Constitutional: Positive for chills, fatigue and fever. Negative for activity change, appetite change and unexpected weight change.  HENT: Positive for postnasal drip and rhinorrhea. Negative for congestion and dental problem.   Eyes: Negative for redness and visual disturbance.  Respiratory: Positive for cough, chest tightness, shortness of breath and wheezing.   Cardiovascular: Negative for chest pain, palpitations and leg swelling.  Gastrointestinal: Positive for nausea and vomiting. Negative for abdominal pain, constipation and diarrhea.  Genitourinary: Negative for difficulty urinating and frequency.  Musculoskeletal: Negative for arthralgias and back pain.  Skin: Positive for wound.  Neurological: Negative for dizziness and headaches.  Psychiatric/Behavioral: Negative for dysphoric mood and sleep disturbance. The patient is not nervous/anxious.     Physical Exam  Constitutional: She appears well-developed and well-nourished. She appears distressed.  Appears tired.  Moderately ill.  Tearful  HENT:  Head: Normocephalic and atraumatic.  Right Ear: External ear normal.  Left Ear: External ear normal.  Nose: Nose normal.  Mouth/Throat: Oropharynx is clear and moist.  Mild nasal congestion, clear rhinorrhea  Eyes: Pupils are equal, round, and reactive to light. EOM are normal.  Neck: Normal range of motion.  Cardiovascular: Normal rate, regular rhythm and normal heart sounds.  Pulmonary/Chest: Effort normal. She has wheezes. She has no  rales.  Scattered inspiratory wheeze.  No rales  Musculoskeletal: Normal range of motion. She exhibits no edema.  No edema  Lymphadenopathy:    She has no cervical adenopathy.  Skin: No rash noted. No erythema.  Incision left forearm clean and dry.  Sutures intact  Psychiatric: Her behavior is normal.  Quiet behavior, distress with illness, tearful    BP 132/68   Pulse 91   Temp 98.6 F (37 C) (Oral)   Resp 18   Ht 5\' 4"  (1.626 m)   Wt 133 lb 0.6 oz (60.3 kg)   SpO2 94%   BMI 22.84 kg/m     ASSESSMENT/PLAN:  1. Suture check Healing  2. COPD with acute exacerbation ([place]) Wheezing present, will give 5 days of prednisone  3. Viral URI with cough Tessalon for cough Zofran for nausea   Patient Instructions  Rest push fluids Take the prednisone for 5 days Take the cough pill as needed Use zofran as needed nausea / vomiting Call if not improving in 2-3 days  Stitches come out at 7-10 days   Raylene Everts,  MD

## 2017-10-30 ENCOUNTER — Other Ambulatory Visit: Payer: Self-pay

## 2017-10-30 ENCOUNTER — Encounter: Payer: Self-pay | Admitting: Family Medicine

## 2017-10-30 ENCOUNTER — Ambulatory Visit (INDEPENDENT_AMBULATORY_CARE_PROVIDER_SITE_OTHER): Payer: Medicare Other | Admitting: Family Medicine

## 2017-10-30 VITALS — BP 104/54 | HR 120 | Temp 97.9°F | Resp 18 | Ht 64.0 in | Wt 133.0 lb

## 2017-10-30 DIAGNOSIS — Z4889 Encounter for other specified surgical aftercare: Secondary | ICD-10-CM | POA: Diagnosis not present

## 2017-10-30 DIAGNOSIS — I10 Essential (primary) hypertension: Secondary | ICD-10-CM | POA: Diagnosis not present

## 2017-10-30 MED ORDER — HYDROCODONE-ACETAMINOPHEN 7.5-325 MG PO TABS: 1.0000 | ORAL_TABLET | Freq: Four times a day (QID) | ORAL | 0 refills | Status: DC | PRN

## 2017-10-30 MED ORDER — PREDNISONE 20 MG PO TABS: 20.0000 mg | ORAL_TABLET | Freq: Two times a day (BID) | ORAL | 0 refills | Status: DC

## 2017-10-30 MED ORDER — PROPRANOLOL HCL 80 MG PO TABS: 80.0000 mg | ORAL_TABLET | Freq: Two times a day (BID) | ORAL | 1 refills | Status: AC

## 2017-10-30 MED ORDER — BENZONATATE 200 MG PO CAPS: 200.0000 mg | ORAL_CAPSULE | Freq: Three times a day (TID) | ORAL | 0 refills | Status: DC | PRN

## 2017-10-30 NOTE — Progress Notes (Signed)
Chief Complaint  Patient presents with  . Cough  . Suture / Staple Removal   Patient is here for suture removal.  No complaints.  The sutures are removed and the wound looks good.  I did put a Steri-Strip across it for stability.  There is no infection redness or tenderness remaining.  No lump palpable.   patient brings in a number of pill bottles that are empty that she would like refills of. She was seen for an exacerbation of COPD and given prednisone and Tessalon.  These helped her.  As soon as she went off the medicine her COPD worsened.  She would like refills of both of these.  She wants a refill the hydrocodone.  I did check the [employer].  She does not take narcotics.  I cautioned her that these are dangerous with her benzodiazepine.  She states she has occasional pain, so I refilled 15 only with precautions.  She needs a refill of her propanolol.  She ran out yesterday.  Told her this explains why she has tachycardia today.  She has rebound tachycardia from sudden stopping of propanolol.  She promises to get this prescription filled today and to take a pill today to get rid of her symptoms.  She wondered why she was feeling anxious  Patient Active Problem List   Diagnosis Date Noted  . Somnolence, daytime 06/13/2017  . Tobacco abuse 05/02/2017  . 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 Atherosclerotic peripheral vascular disease with intermittent claudication ([place]) 01/25/2017  . GERD (gastroesophageal reflux disease) 10/13/2015  . Chronic obstructive pulmonary disease ([place]) 07/14/2015  . Obstructive chronic bronchitis without exacerbation ([place]) 04/13/2015  . Headache, tension type, chronic 04/13/2015  . Anxiety state 02/27/2015  . FH: colon cancer 01/15/2015  . Hepatitis C virus infection resolved after antiviral drug therapy 01/15/2015  . Hyperlipidemia 11/26/2008  . UNSPECIFIED ANEMIA  11/26/2008  . MIGRAINE HEADACHE 11/11/2008  . Essential hypertension, benign 11/11/2008  . Osteoarthritis of hands, bilateral 11/11/2008    Outpatient Encounter Medications as of 10/30/2017  Medication Sig  . albuterol (PROVENTIL) (2.5 MG/3ML) 0.083% nebulizer solution INHALE 1 VIAL VIA NEBULIZER EVERY 6 HOURS AS NEEDED FOR WHEEZING AND SHORTNESS OF BREATH  . amLODipine (NORVASC) 5 MG tablet Take 1 tablet (5 mg total) by mouth daily.  . Ascorbic Acid (VITAMIN C PO) Take 1 tablet by mouth daily.  . benzonatate (TESSALON) 200 MG capsule Take 1 capsule (200 mg total) by mouth 3 (three) times daily as needed for cough.  . budesonide-formoterol (SYMBICORT) 160-4.5 MCG/ACT inhaler Inhale 2 puffs into the lungs 2 (two) times daily.  . busPIRone (BUSPAR) 10 MG tablet Take 10 mg by mouth daily.  Marland Kitchen CALCIUM-VITAMIN D PO Take 1 tablet by mouth daily.  . Ferrous Sulfate (IRON) 325 (65 Fe) MG TABS Take 1 tablet by mouth daily.  Marland Kitchen FLUoxetine (PROZAC) 40 MG capsule Take 40 mg by mouth daily.   . furosemide (LASIX) 20 MG tablet TAKE ONE TABLET BY mouth daily  . HYDROcodone-acetaminophen (NORCO) 7.5-325 MG tablet Take 1 tablet by mouth every 6 (six) hours as needed for moderate pain.  Marland Kitchen LORazepam (ATIVAN) 0.5 MG tablet Take 0.5 mg by mouth every 12 (twelve) hours as needed for anxiety.   Marland Kitchen losartan (COZAAR) 100 MG tablet Take 1 tablet (100 mg total) by mouth daily.  . meloxicam (MOBIC) 15 MG tablet Take 1 tablet (15 mg total) by  mouth daily.  . mirtazapine (REMERON) 15 MG tablet Take 15 mg by mouth at bedtime.  . Multiple Vitamin (MULTIVITAMIN) tablet Take 1 tablet by mouth daily.  Marland Kitchen omeprazole (PRILOSEC) 20 MG capsule TAKE 1 CAPSULE BY MOUTH ONCE DAILY BEFORE A MEAL  . ondansetron (ZOFRAN-ODT) 8 MG disintegrating tablet Take 1 tablet (8 mg total) by mouth every 8 (eight) hours as needed for nausea or vomiting.  . predniSONE (DELTASONE) 20 MG tablet Take 1 tablet (20 mg total) by mouth 2 (two) times daily  with a meal.  . propranolol (INDERAL) 80 MG tablet Take 1 tablet (80 mg total) by mouth 2 (two) times daily.  . simvastatin (ZOCOR) 40 MG tablet Take 1 tablet (40 mg total) daily at 6 PM by mouth.  . SUMAtriptan (IMITREX) 50 MG tablet TAKE AT BEGINNING OF MIGRAINE. TAKE A SECOND DOSE IN 2 HOURS IF NEEDED.  Marland Kitchen traZODone (DESYREL) 50 MG tablet Take 75 mg by mouth at bedtime as needed for sleep.    No facility-administered encounter medications on file as of 10/30/2017.     Allergies  Allergen Reactions  . Asa [Aspirin] Other (See Comments)    REACTION: Stomach upset    Review of Systems  Constitutional: Positive for fatigue. Negative for activity change, appetite change, chills, fever and unexpected weight change.  HENT: Positive for postnasal drip and rhinorrhea. Negative for congestion and dental problem.   Eyes: Negative for redness and visual disturbance.  Respiratory: Positive for cough, chest tightness, shortness of breath and wheezing.   Cardiovascular: Negative for chest pain, palpitations and leg swelling.  Gastrointestinal: Negative for abdominal pain, constipation, diarrhea, nausea and vomiting.  Genitourinary: Negative for difficulty urinating and frequency.  Musculoskeletal: Negative for arthralgias and back pain.  Skin: Positive for wound.  Neurological: Negative for dizziness and headaches.  Psychiatric/Behavioral: Negative for dysphoric mood and sleep disturbance. The patient is not nervous/anxious.     Physical Exam  Constitutional: She appears well-developed and well-nourished. No distress.  Appears tired.  Always seems a bit sedated  HENT:  Head: Normocephalic and atraumatic.  Right Ear: External ear normal.  Left Ear: External ear normal.  Nose: Nose normal.  Mouth/Throat: Oropharynx is clear and moist.  Eyes: Pupils are equal, round, and reactive to light. EOM are normal.  Neck: Normal range of motion.  Cardiovascular: Normal rate, regular rhythm and normal  heart sounds.  Pulmonary/Chest: Effort normal. She has wheezes. She has no rales.  Scattered end expiratory wheeze.  No rales  Musculoskeletal: Normal range of motion. She exhibits no edema.  No edema  Lymphadenopathy:    She has no cervical adenopathy.  Skin: No rash noted. No erythema.  Incision left forearm clean and dry.  Sutures removed without difficulty  Psychiatric: She has a normal mood and affect. Her behavior is normal.  Quiet behavior    BP (!) 104/54   Pulse (!) 120   Temp 97.9 F (36.6 C) (Oral)   Resp 18   Ht 5\' 4"  (1.626 m)   Wt 133 lb 0.6 oz (60.3 kg)   SpO2 91%   BMI 22.84 kg/m    ASSESSMENT/PLAN:  1. Suture check ReMoved  2. Essential hypertension, benign Controlled.  Medicines refilled.   Patient Instructions  Medicine refilled  Call soon to get an appt with new PCP     Raylene Everts, MD

## 2017-10-30 NOTE — Patient Instructions (Signed)
Medicine refilled  Call soon to get an appt with new PCP

## 2017-11-15 ENCOUNTER — Other Ambulatory Visit: Payer: Self-pay | Admitting: Family Medicine

## 2017-11-19 ENCOUNTER — Other Ambulatory Visit: Payer: Self-pay | Admitting: Family Medicine

## 2017-11-30 ENCOUNTER — Other Ambulatory Visit: Payer: Self-pay | Admitting: Family Medicine

## 2017-12-13 ENCOUNTER — Ambulatory Visit: Payer: Medicare Other | Admitting: Family Medicine

## 2018-02-06 DIAGNOSIS — D649 Anemia, unspecified: Secondary | ICD-10-CM | POA: Diagnosis not present

## 2018-02-06 DIAGNOSIS — E782 Mixed hyperlipidemia: Secondary | ICD-10-CM | POA: Diagnosis not present

## 2018-02-06 DIAGNOSIS — J449 Chronic obstructive pulmonary disease, unspecified: Secondary | ICD-10-CM | POA: Diagnosis not present

## 2018-02-06 DIAGNOSIS — K219 Gastro-esophageal reflux disease without esophagitis: Secondary | ICD-10-CM | POA: Diagnosis not present

## 2018-02-06 DIAGNOSIS — I10 Essential (primary) hypertension: Secondary | ICD-10-CM | POA: Diagnosis not present

## 2018-02-09 ENCOUNTER — Other Ambulatory Visit: Payer: Self-pay | Admitting: Family Medicine

## 2018-02-18 DIAGNOSIS — E782 Mixed hyperlipidemia: Secondary | ICD-10-CM | POA: Diagnosis not present

## 2018-02-18 DIAGNOSIS — Z0000 Encounter for general adult medical examination without abnormal findings: Secondary | ICD-10-CM | POA: Diagnosis not present

## 2018-02-18 DIAGNOSIS — I10 Essential (primary) hypertension: Secondary | ICD-10-CM | POA: Diagnosis not present

## 2018-02-18 DIAGNOSIS — D649 Anemia, unspecified: Secondary | ICD-10-CM | POA: Diagnosis not present

## 2018-03-04 DIAGNOSIS — I10 Essential (primary) hypertension: Secondary | ICD-10-CM | POA: Diagnosis not present

## 2018-03-04 DIAGNOSIS — J449 Chronic obstructive pulmonary disease, unspecified: Secondary | ICD-10-CM | POA: Diagnosis not present

## 2018-03-04 DIAGNOSIS — K219 Gastro-esophageal reflux disease without esophagitis: Secondary | ICD-10-CM | POA: Diagnosis not present

## 2018-03-04 DIAGNOSIS — E782 Mixed hyperlipidemia: Secondary | ICD-10-CM | POA: Diagnosis not present

## 2018-03-04 DIAGNOSIS — Z0000 Encounter for general adult medical examination without abnormal findings: Secondary | ICD-10-CM | POA: Diagnosis not present

## 2018-03-14 ENCOUNTER — Other Ambulatory Visit: Payer: Self-pay | Admitting: Family Medicine

## 2018-04-01 DIAGNOSIS — Z23 Encounter for immunization: Secondary | ICD-10-CM | POA: Diagnosis not present

## 2018-04-01 DIAGNOSIS — G43009 Migraine without aura, not intractable, without status migrainosus: Secondary | ICD-10-CM | POA: Diagnosis not present

## 2018-04-01 DIAGNOSIS — Z6822 Body mass index (BMI) 22.0-22.9, adult: Secondary | ICD-10-CM | POA: Diagnosis not present

## 2018-04-01 DIAGNOSIS — E782 Mixed hyperlipidemia: Secondary | ICD-10-CM | POA: Diagnosis not present

## 2018-04-12 DIAGNOSIS — E782 Mixed hyperlipidemia: Secondary | ICD-10-CM | POA: Diagnosis not present

## 2018-04-12 DIAGNOSIS — I10 Essential (primary) hypertension: Secondary | ICD-10-CM | POA: Diagnosis not present

## 2018-04-12 DIAGNOSIS — J449 Chronic obstructive pulmonary disease, unspecified: Secondary | ICD-10-CM | POA: Diagnosis not present

## 2018-04-12 DIAGNOSIS — D649 Anemia, unspecified: Secondary | ICD-10-CM | POA: Diagnosis not present

## 2018-05-10 ENCOUNTER — Other Ambulatory Visit: Payer: Self-pay | Admitting: Nurse Practitioner

## 2018-05-19 DIAGNOSIS — I10 Essential (primary) hypertension: Secondary | ICD-10-CM | POA: Diagnosis not present

## 2018-05-19 DIAGNOSIS — E782 Mixed hyperlipidemia: Secondary | ICD-10-CM | POA: Diagnosis not present

## 2018-06-10 DIAGNOSIS — D649 Anemia, unspecified: Secondary | ICD-10-CM | POA: Diagnosis not present

## 2018-06-10 DIAGNOSIS — I10 Essential (primary) hypertension: Secondary | ICD-10-CM | POA: Diagnosis not present

## 2018-06-10 DIAGNOSIS — E782 Mixed hyperlipidemia: Secondary | ICD-10-CM | POA: Diagnosis not present

## 2018-06-10 DIAGNOSIS — Z1329 Encounter for screening for other suspected endocrine disorder: Secondary | ICD-10-CM | POA: Diagnosis not present

## 2018-06-13 DIAGNOSIS — J449 Chronic obstructive pulmonary disease, unspecified: Secondary | ICD-10-CM | POA: Diagnosis not present

## 2018-06-13 DIAGNOSIS — I10 Essential (primary) hypertension: Secondary | ICD-10-CM | POA: Diagnosis not present

## 2018-06-13 DIAGNOSIS — D649 Anemia, unspecified: Secondary | ICD-10-CM | POA: Diagnosis not present

## 2018-06-13 DIAGNOSIS — K219 Gastro-esophageal reflux disease without esophagitis: Secondary | ICD-10-CM | POA: Diagnosis not present

## 2018-06-13 DIAGNOSIS — E782 Mixed hyperlipidemia: Secondary | ICD-10-CM | POA: Diagnosis not present

## 2018-06-18 DIAGNOSIS — D649 Anemia, unspecified: Secondary | ICD-10-CM | POA: Diagnosis not present

## 2018-06-18 DIAGNOSIS — E782 Mixed hyperlipidemia: Secondary | ICD-10-CM | POA: Diagnosis not present

## 2018-06-18 DIAGNOSIS — J449 Chronic obstructive pulmonary disease, unspecified: Secondary | ICD-10-CM | POA: Diagnosis not present

## 2018-06-18 DIAGNOSIS — K219 Gastro-esophageal reflux disease without esophagitis: Secondary | ICD-10-CM | POA: Diagnosis not present

## 2018-06-18 DIAGNOSIS — I10 Essential (primary) hypertension: Secondary | ICD-10-CM | POA: Diagnosis not present

## 2018-06-20 ENCOUNTER — Other Ambulatory Visit (HOSPITAL_COMMUNITY): Payer: Self-pay | Admitting: Internal Medicine

## 2018-06-20 DIAGNOSIS — Z1231 Encounter for screening mammogram for malignant neoplasm of breast: Secondary | ICD-10-CM

## 2018-07-11 ENCOUNTER — Encounter (HOSPITAL_COMMUNITY): Payer: Self-pay

## 2018-07-11 ENCOUNTER — Ambulatory Visit (HOSPITAL_COMMUNITY)
Admission: RE | Admit: 2018-07-11 | Discharge: 2018-07-11 | Disposition: A | Discharge status: Home / Self Care | Payer: Medicare Other | Source: Ambulatory Visit | Attending: Internal Medicine | Admitting: Internal Medicine

## 2018-07-11 DIAGNOSIS — Z1231 Encounter for screening mammogram for malignant neoplasm of breast: Secondary | ICD-10-CM | POA: Diagnosis not present

## 2018-07-25 DIAGNOSIS — I10 Essential (primary) hypertension: Secondary | ICD-10-CM | POA: Diagnosis not present

## 2018-07-25 DIAGNOSIS — J449 Chronic obstructive pulmonary disease, unspecified: Secondary | ICD-10-CM | POA: Diagnosis not present

## 2018-07-25 DIAGNOSIS — D649 Anemia, unspecified: Secondary | ICD-10-CM | POA: Diagnosis not present

## 2018-07-25 DIAGNOSIS — K219 Gastro-esophageal reflux disease without esophagitis: Secondary | ICD-10-CM | POA: Diagnosis not present

## 2018-07-25 DIAGNOSIS — E782 Mixed hyperlipidemia: Secondary | ICD-10-CM | POA: Diagnosis not present

## 2018-08-07 DIAGNOSIS — G4700 Insomnia, unspecified: Secondary | ICD-10-CM | POA: Diagnosis not present

## 2018-08-07 DIAGNOSIS — I10 Essential (primary) hypertension: Secondary | ICD-10-CM | POA: Diagnosis not present

## 2018-08-07 DIAGNOSIS — G4459 Other complicated headache syndrome: Secondary | ICD-10-CM | POA: Diagnosis not present

## 2018-08-16 DIAGNOSIS — M542 Cervicalgia: Secondary | ICD-10-CM | POA: Diagnosis not present

## 2018-08-16 DIAGNOSIS — I10 Essential (primary) hypertension: Secondary | ICD-10-CM | POA: Diagnosis not present

## 2018-08-16 DIAGNOSIS — M25511 Pain in right shoulder: Secondary | ICD-10-CM | POA: Diagnosis not present

## 2018-08-19 DIAGNOSIS — J449 Chronic obstructive pulmonary disease, unspecified: Secondary | ICD-10-CM | POA: Diagnosis not present

## 2018-08-19 DIAGNOSIS — I10 Essential (primary) hypertension: Secondary | ICD-10-CM | POA: Diagnosis not present

## 2018-08-19 DIAGNOSIS — D649 Anemia, unspecified: Secondary | ICD-10-CM | POA: Diagnosis not present

## 2018-08-19 DIAGNOSIS — K219 Gastro-esophageal reflux disease without esophagitis: Secondary | ICD-10-CM | POA: Diagnosis not present

## 2018-08-19 DIAGNOSIS — E782 Mixed hyperlipidemia: Secondary | ICD-10-CM | POA: Diagnosis not present

## 2018-09-23 ENCOUNTER — Ambulatory Visit (HOSPITAL_COMMUNITY)
Admission: RE | Admit: 2018-09-23 | Discharge: 2018-09-23 | Disposition: A | Discharge status: Home / Self Care | Payer: Medicare Other | Source: Ambulatory Visit | Attending: Neurology | Admitting: Neurology

## 2018-09-23 ENCOUNTER — Other Ambulatory Visit: Payer: Self-pay

## 2018-09-23 ENCOUNTER — Other Ambulatory Visit (HOSPITAL_COMMUNITY): Payer: Self-pay | Admitting: Neurology

## 2018-09-23 DIAGNOSIS — M542 Cervicalgia: Secondary | ICD-10-CM

## 2018-09-23 DIAGNOSIS — M25511 Pain in right shoulder: Secondary | ICD-10-CM | POA: Diagnosis not present

## 2018-09-23 DIAGNOSIS — I10 Essential (primary) hypertension: Secondary | ICD-10-CM | POA: Diagnosis not present

## 2018-09-23 DIAGNOSIS — M47813 Spondylosis without myelopathy or radiculopathy, cervicothoracic region: Secondary | ICD-10-CM | POA: Diagnosis not present

## 2018-09-26 DIAGNOSIS — J449 Chronic obstructive pulmonary disease, unspecified: Secondary | ICD-10-CM | POA: Diagnosis not present

## 2018-09-26 DIAGNOSIS — K219 Gastro-esophageal reflux disease without esophagitis: Secondary | ICD-10-CM | POA: Diagnosis not present

## 2018-09-26 DIAGNOSIS — I10 Essential (primary) hypertension: Secondary | ICD-10-CM | POA: Diagnosis not present

## 2018-09-26 DIAGNOSIS — E782 Mixed hyperlipidemia: Secondary | ICD-10-CM | POA: Diagnosis not present

## 2018-09-26 DIAGNOSIS — D649 Anemia, unspecified: Secondary | ICD-10-CM | POA: Diagnosis not present

## 2018-10-09 DIAGNOSIS — J449 Chronic obstructive pulmonary disease, unspecified: Secondary | ICD-10-CM | POA: Diagnosis not present

## 2018-10-09 DIAGNOSIS — K219 Gastro-esophageal reflux disease without esophagitis: Secondary | ICD-10-CM | POA: Diagnosis not present

## 2018-10-09 DIAGNOSIS — I10 Essential (primary) hypertension: Secondary | ICD-10-CM | POA: Diagnosis not present

## 2018-10-09 DIAGNOSIS — E782 Mixed hyperlipidemia: Secondary | ICD-10-CM | POA: Diagnosis not present

## 2018-10-09 DIAGNOSIS — D649 Anemia, unspecified: Secondary | ICD-10-CM | POA: Diagnosis not present

## 2018-10-17 DIAGNOSIS — E782 Mixed hyperlipidemia: Secondary | ICD-10-CM | POA: Diagnosis not present

## 2018-10-17 DIAGNOSIS — I10 Essential (primary) hypertension: Secondary | ICD-10-CM | POA: Diagnosis not present

## 2018-10-17 DIAGNOSIS — S31821A Laceration without foreign body of left buttock, initial encounter: Secondary | ICD-10-CM | POA: Diagnosis not present

## 2018-10-17 DIAGNOSIS — D649 Anemia, unspecified: Secondary | ICD-10-CM | POA: Diagnosis not present

## 2018-10-17 DIAGNOSIS — Z9181 History of falling: Secondary | ICD-10-CM | POA: Diagnosis not present

## 2018-10-21 DIAGNOSIS — D509 Iron deficiency anemia, unspecified: Secondary | ICD-10-CM | POA: Diagnosis not present

## 2018-10-21 DIAGNOSIS — E782 Mixed hyperlipidemia: Secondary | ICD-10-CM | POA: Diagnosis not present

## 2018-10-21 DIAGNOSIS — J449 Chronic obstructive pulmonary disease, unspecified: Secondary | ICD-10-CM | POA: Diagnosis not present

## 2018-10-21 DIAGNOSIS — R944 Abnormal results of kidney function studies: Secondary | ICD-10-CM | POA: Diagnosis not present

## 2018-10-21 DIAGNOSIS — I10 Essential (primary) hypertension: Secondary | ICD-10-CM | POA: Diagnosis not present

## 2018-10-22 ENCOUNTER — Other Ambulatory Visit: Payer: Self-pay

## 2018-10-22 ENCOUNTER — Emergency Department (HOSPITAL_COMMUNITY): Payer: Medicare Other

## 2018-10-22 ENCOUNTER — Emergency Department (HOSPITAL_COMMUNITY)
Admission: EM | Admit: 2018-10-22 | Discharge: 2018-10-22 | Disposition: A | Discharge status: Home / Self Care | Payer: Medicare Other | Attending: Emergency Medicine | Admitting: Emergency Medicine

## 2018-10-22 ENCOUNTER — Encounter (HOSPITAL_COMMUNITY): Payer: Self-pay | Admitting: *Deleted

## 2018-10-22 DIAGNOSIS — Z79899 Other long term (current) drug therapy: Secondary | ICD-10-CM | POA: Diagnosis not present

## 2018-10-22 DIAGNOSIS — M542 Cervicalgia: Secondary | ICD-10-CM | POA: Insufficient documentation

## 2018-10-22 DIAGNOSIS — M545 Low back pain, unspecified: Secondary | ICD-10-CM

## 2018-10-22 DIAGNOSIS — S0990XA Unspecified injury of head, initial encounter: Secondary | ICD-10-CM | POA: Diagnosis not present

## 2018-10-22 DIAGNOSIS — R079 Chest pain, unspecified: Secondary | ICD-10-CM | POA: Diagnosis not present

## 2018-10-22 DIAGNOSIS — S3993XA Unspecified injury of pelvis, initial encounter: Secondary | ICD-10-CM | POA: Diagnosis not present

## 2018-10-22 DIAGNOSIS — F17210 Nicotine dependence, cigarettes, uncomplicated: Secondary | ICD-10-CM | POA: Insufficient documentation

## 2018-10-22 DIAGNOSIS — I10 Essential (primary) hypertension: Secondary | ICD-10-CM | POA: Insufficient documentation

## 2018-10-22 DIAGNOSIS — S199XXA Unspecified injury of neck, initial encounter: Secondary | ICD-10-CM | POA: Diagnosis not present

## 2018-10-22 DIAGNOSIS — J449 Chronic obstructive pulmonary disease, unspecified: Secondary | ICD-10-CM | POA: Diagnosis not present

## 2018-10-22 DIAGNOSIS — S299XXA Unspecified injury of thorax, initial encounter: Secondary | ICD-10-CM | POA: Diagnosis not present

## 2018-10-22 DIAGNOSIS — R0789 Other chest pain: Secondary | ICD-10-CM | POA: Diagnosis not present

## 2018-10-22 DIAGNOSIS — S3991XA Unspecified injury of abdomen, initial encounter: Secondary | ICD-10-CM | POA: Diagnosis not present

## 2018-10-22 LAB — CBC WITH DIFFERENTIAL/PLATELET
Abs Immature Granulocytes: 0.04 10*3/uL (ref 0.00–0.07)
Basophils Absolute: 0 10*3/uL (ref 0.0–0.1)
Basophils Relative: 0 %
Eosinophils Absolute: 0.4 10*3/uL (ref 0.0–0.5)
Eosinophils Relative: 5 %
HCT: 33.4 % — ABNORMAL LOW (ref 36.0–46.0)
Hemoglobin: 10.6 g/dL — ABNORMAL LOW (ref 12.0–15.0)
Immature Granulocytes: 0 %
Lymphocytes Relative: 26 %
Lymphs Abs: 2.4 10*3/uL (ref 0.7–4.0)
MCH: 28.2 pg (ref 26.0–34.0)
MCHC: 31.7 g/dL (ref 30.0–36.0)
MCV: 88.8 fL (ref 80.0–100.0)
Monocytes Absolute: 0.8 10*3/uL (ref 0.1–1.0)
Monocytes Relative: 8 %
Neutro Abs: 5.5 10*3/uL (ref 1.7–7.7)
Neutrophils Relative %: 61 %
Platelets: 225 10*3/uL (ref 150–400)
RBC: 3.76 MIL/uL — ABNORMAL LOW (ref 3.87–5.11)
RDW: 14.6 % (ref 11.5–15.5)
WBC: 9.2 10*3/uL (ref 4.0–10.5)
nRBC: 0 % (ref 0.0–0.2)

## 2018-10-22 LAB — BASIC METABOLIC PANEL
Anion gap: 5 (ref 5–15)
BUN: 16 mg/dL (ref 8–23)
CO2: 22 mmol/L (ref 22–32)
Calcium: 9.1 mg/dL (ref 8.9–10.3)
Chloride: 112 mmol/L — ABNORMAL HIGH (ref 98–111)
Creatinine, Ser: 0.92 mg/dL (ref 0.44–1.00)
GFR calc Af Amer: >60 mL/min (ref >60)
GFR calc non Af Amer: >60 mL/min (ref >60)
Glucose, Bld: 87 mg/dL (ref 70–99)
Potassium: 3.8 mmol/L (ref 3.5–5.1)
Sodium: 139 mmol/L (ref 135–145)

## 2018-10-22 LAB — TROPONIN I: Troponin I: <0.03 ng/mL (ref <0.03)

## 2018-10-22 MED ORDER — IOHEXOL 300 MG/ML  SOLN
100.0000 mL | Freq: Once | INTRAMUSCULAR | Status: AC | PRN
  Administered 2018-10-22: 100 mL via INTRAVENOUS

## 2018-10-22 MED ORDER — MORPHINE SULFATE (PF) 2 MG/ML IV SOLN
2.0000 mg | INTRAVENOUS | Status: DC | PRN
  Administered 2018-10-22: 2 mg via INTRAVENOUS
  Filled 2018-10-22: qty 1

## 2018-10-22 MED ORDER — METHOCARBAMOL 500 MG PO TABS: 1000.0000 mg | ORAL_TABLET | Freq: Four times a day (QID) | ORAL | 0 refills | Status: DC | PRN

## 2018-10-22 MED ORDER — ONDANSETRON HCL 4 MG/2ML IJ SOLN
4.0000 mg | INTRAMUSCULAR | Status: DC | PRN
  Administered 2018-10-22: 13:00:00 4 mg via INTRAVENOUS
  Filled 2018-10-22: qty 2

## 2018-10-22 MED ORDER — HYDROCODONE-ACETAMINOPHEN 5-325 MG PO TABS: ORAL_TABLET | ORAL | 0 refills | Status: DC

## 2018-10-22 NOTE — Discharge Instructions (Addendum)
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 Either attention on future follow-up studies recommended, or alternatively referral to urology for evaluation and consideration of MRI."  Take the prescriptions as directed.  Apply moist heat or ice to the area(s) of discomfort, for 15 minutes at a time, several times per day for the next few days.  Do not fall asleep on a heating or ice pack.  Call your regular medical doctor today to schedule a follow up appointment this week for this ED visit as well as your CT findings above.  Return to the Emergency Department immediately if worsening.
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
## 2018-10-29 DIAGNOSIS — M542 Cervicalgia: Secondary | ICD-10-CM | POA: Diagnosis not present

## 2018-10-29 DIAGNOSIS — R079 Chest pain, unspecified: Secondary | ICD-10-CM | POA: Diagnosis not present

## 2018-11-05 NOTE — Progress Notes (Signed)
REVIEWED-NO ADDITIONAL RECOMMENDATIONS. 

## 2018-11-14 DIAGNOSIS — Z0000 Encounter for general adult medical examination without abnormal findings: Secondary | ICD-10-CM | POA: Diagnosis not present

## 2018-11-18 DIAGNOSIS — M545 Low back pain: Secondary | ICD-10-CM | POA: Diagnosis not present

## 2018-11-18 DIAGNOSIS — J449 Chronic obstructive pulmonary disease, unspecified: Secondary | ICD-10-CM | POA: Diagnosis not present

## 2018-11-18 DIAGNOSIS — I10 Essential (primary) hypertension: Secondary | ICD-10-CM | POA: Diagnosis not present

## 2018-11-18 DIAGNOSIS — M25511 Pain in right shoulder: Secondary | ICD-10-CM | POA: Diagnosis not present

## 2018-11-18 DIAGNOSIS — E782 Mixed hyperlipidemia: Secondary | ICD-10-CM | POA: Diagnosis not present

## 2018-11-18 DIAGNOSIS — R944 Abnormal results of kidney function studies: Secondary | ICD-10-CM | POA: Diagnosis not present

## 2018-12-10 DIAGNOSIS — I10 Essential (primary) hypertension: Secondary | ICD-10-CM | POA: Diagnosis not present

## 2018-12-10 DIAGNOSIS — R944 Abnormal results of kidney function studies: Secondary | ICD-10-CM | POA: Diagnosis not present

## 2018-12-10 DIAGNOSIS — E782 Mixed hyperlipidemia: Secondary | ICD-10-CM | POA: Diagnosis not present

## 2018-12-10 DIAGNOSIS — D509 Iron deficiency anemia, unspecified: Secondary | ICD-10-CM | POA: Diagnosis not present

## 2018-12-10 DIAGNOSIS — J449 Chronic obstructive pulmonary disease, unspecified: Secondary | ICD-10-CM | POA: Diagnosis not present

## 2019-01-08 ENCOUNTER — Other Ambulatory Visit: Payer: Self-pay | Admitting: Physician Assistant

## 2019-01-09 DIAGNOSIS — R944 Abnormal results of kidney function studies: Secondary | ICD-10-CM | POA: Diagnosis not present

## 2019-01-09 DIAGNOSIS — I10 Essential (primary) hypertension: Secondary | ICD-10-CM | POA: Diagnosis not present

## 2019-01-09 DIAGNOSIS — D509 Iron deficiency anemia, unspecified: Secondary | ICD-10-CM | POA: Diagnosis not present

## 2019-01-09 DIAGNOSIS — E782 Mixed hyperlipidemia: Secondary | ICD-10-CM | POA: Diagnosis not present

## 2019-01-09 DIAGNOSIS — J449 Chronic obstructive pulmonary disease, unspecified: Secondary | ICD-10-CM | POA: Diagnosis not present

## 2019-01-10 ENCOUNTER — Other Ambulatory Visit: Payer: Self-pay | Admitting: Physician Assistant

## 2019-01-13 DIAGNOSIS — M542 Cervicalgia: Secondary | ICD-10-CM | POA: Diagnosis not present

## 2019-01-13 DIAGNOSIS — M25511 Pain in right shoulder: Secondary | ICD-10-CM | POA: Diagnosis not present

## 2019-01-13 DIAGNOSIS — I10 Essential (primary) hypertension: Secondary | ICD-10-CM | POA: Diagnosis not present

## 2019-02-06 ENCOUNTER — Other Ambulatory Visit: Payer: Self-pay

## 2019-02-06 ENCOUNTER — Other Ambulatory Visit: Payer: Medicare Other

## 2019-02-06 DIAGNOSIS — R6889 Other general symptoms and signs: Secondary | ICD-10-CM | POA: Diagnosis not present

## 2019-02-06 DIAGNOSIS — Z20822 Contact with and (suspected) exposure to covid-19: Secondary | ICD-10-CM

## 2019-02-08 LAB — NOVEL CORONAVIRUS, NAA: SARS-CoV-2, NAA: NOT DETECTED

## 2019-02-21 DIAGNOSIS — D509 Iron deficiency anemia, unspecified: Secondary | ICD-10-CM | POA: Diagnosis not present

## 2019-02-21 DIAGNOSIS — D649 Anemia, unspecified: Secondary | ICD-10-CM | POA: Diagnosis not present

## 2019-02-21 DIAGNOSIS — I10 Essential (primary) hypertension: Secondary | ICD-10-CM | POA: Diagnosis not present

## 2019-02-21 DIAGNOSIS — R944 Abnormal results of kidney function studies: Secondary | ICD-10-CM | POA: Diagnosis not present

## 2019-02-21 DIAGNOSIS — E782 Mixed hyperlipidemia: Secondary | ICD-10-CM | POA: Diagnosis not present

## 2019-02-24 ENCOUNTER — Other Ambulatory Visit: Payer: Self-pay

## 2019-02-24 NOTE — Patient Outreach (Signed)
[facility]) Care Management  02/24/2019  CHERISH RUNDE 1951/03/10 [id number]   Medication Adherence call to Mrs. Lattie Corns [facility] Identifiers Verify spoke with patient she is past due on Lisinopril 40 mg patient explain she is taking 1 tablet daily she said she has 4 tablets patient ask if we can call the pharmacy an order this medication pharmacy will have it ready for pt to pick up.Mrs. [name] is showing past due under [place].   [facility] Management Direct Dial [phone]  Fax [phone] [email or web address]

## 2019-02-25 DIAGNOSIS — R944 Abnormal results of kidney function studies: Secondary | ICD-10-CM | POA: Diagnosis not present

## 2019-02-25 DIAGNOSIS — E782 Mixed hyperlipidemia: Secondary | ICD-10-CM | POA: Diagnosis not present

## 2019-02-25 DIAGNOSIS — I10 Essential (primary) hypertension: Secondary | ICD-10-CM | POA: Diagnosis not present

## 2019-02-25 DIAGNOSIS — J449 Chronic obstructive pulmonary disease, unspecified: Secondary | ICD-10-CM | POA: Diagnosis not present

## 2019-02-25 DIAGNOSIS — D509 Iron deficiency anemia, unspecified: Secondary | ICD-10-CM | POA: Diagnosis not present

## 2019-03-11 DIAGNOSIS — I10 Essential (primary) hypertension: Secondary | ICD-10-CM | POA: Diagnosis not present

## 2019-03-11 DIAGNOSIS — J449 Chronic obstructive pulmonary disease, unspecified: Secondary | ICD-10-CM | POA: Diagnosis not present

## 2019-03-11 DIAGNOSIS — E782 Mixed hyperlipidemia: Secondary | ICD-10-CM | POA: Diagnosis not present

## 2019-03-11 DIAGNOSIS — R944 Abnormal results of kidney function studies: Secondary | ICD-10-CM | POA: Diagnosis not present

## 2019-03-11 DIAGNOSIS — D509 Iron deficiency anemia, unspecified: Secondary | ICD-10-CM | POA: Diagnosis not present

## 2019-03-22 ENCOUNTER — Encounter (HOSPITAL_COMMUNITY): Payer: Self-pay | Admitting: Emergency Medicine

## 2019-03-22 ENCOUNTER — Other Ambulatory Visit: Payer: Self-pay

## 2019-03-22 ENCOUNTER — Emergency Department (HOSPITAL_COMMUNITY)
Admission: EM | Admit: 2019-03-22 | Discharge: 2019-03-22 | Disposition: A | Discharge status: Home / Self Care | Payer: Medicare Other | Attending: Emergency Medicine | Admitting: Emergency Medicine

## 2019-03-22 ENCOUNTER — Emergency Department (HOSPITAL_COMMUNITY): Payer: Medicare Other

## 2019-03-22 DIAGNOSIS — S62366A Nondisplaced fracture of neck of fifth metacarpal bone, right hand, initial encounter for closed fracture: Secondary | ICD-10-CM | POA: Insufficient documentation

## 2019-03-22 DIAGNOSIS — J449 Chronic obstructive pulmonary disease, unspecified: Secondary | ICD-10-CM | POA: Insufficient documentation

## 2019-03-22 DIAGNOSIS — W010XXA Fall on same level from slipping, tripping and stumbling without subsequent striking against object, initial encounter: Secondary | ICD-10-CM | POA: Diagnosis not present

## 2019-03-22 DIAGNOSIS — Y999 Unspecified external cause status: Secondary | ICD-10-CM | POA: Insufficient documentation

## 2019-03-22 DIAGNOSIS — S59911A Unspecified injury of right forearm, initial encounter: Secondary | ICD-10-CM | POA: Diagnosis present

## 2019-03-22 DIAGNOSIS — F17210 Nicotine dependence, cigarettes, uncomplicated: Secondary | ICD-10-CM | POA: Insufficient documentation

## 2019-03-22 DIAGNOSIS — Y939 Activity, unspecified: Secondary | ICD-10-CM | POA: Insufficient documentation

## 2019-03-22 DIAGNOSIS — Z791 Long term (current) use of non-steroidal anti-inflammatories (NSAID): Secondary | ICD-10-CM | POA: Diagnosis not present

## 2019-03-22 DIAGNOSIS — I10 Essential (primary) hypertension: Secondary | ICD-10-CM | POA: Insufficient documentation

## 2019-03-22 DIAGNOSIS — Y929 Unspecified place or not applicable: Secondary | ICD-10-CM | POA: Diagnosis not present

## 2019-03-22 DIAGNOSIS — Z79899 Other long term (current) drug therapy: Secondary | ICD-10-CM | POA: Insufficient documentation

## 2019-03-22 DIAGNOSIS — S62306A Unspecified fracture of fifth metacarpal bone, right hand, initial encounter for closed fracture: Secondary | ICD-10-CM | POA: Diagnosis not present

## 2019-03-22 MED ORDER — HYDROCODONE-ACETAMINOPHEN 5-325 MG PO TABS: 1.0000 | ORAL_TABLET | Freq: Four times a day (QID) | ORAL | 0 refills | Status: DC | PRN
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
## 2019-03-22 NOTE — ED Provider Notes (Signed)
[facility] EMERGENCY DEPARTMENT Provider Note   CSN: [id number] Arrival date & time: 03/22/19  1308     History   Chief Complaint Chief Complaint  Patient presents with  . Fall    HPI Cynthia [name] is a 68 y.o. female w PMHx HTN, GERD, hepatitis C, COPD, presenting to the emergency department with complaint of sudden onset of right hand pain after mechanical fall that occurred last night.  She states she tripped and fell on landed on her right hand as it was somewhat twisted.  She has generalized pain to the hand though it is worse to the ulnar aspect.  She has pain with movement of her hand that shoots into her arm.  She endorses associated swelling.  She is left-hand dominant.  No numbness or weakness.  No wounds.  No head trauma or LOC.  She is taking her prescribed meloxicam for symptoms.     The history is provided by the patient.    Past Medical History:  Diagnosis Date  . Allergy   . Anemia   . Angina pectoris ([place])   . Anxiety   . Arthritis   . Bronchitis   . Bronchitis   . COPD (chronic obstructive pulmonary disease) ([place])   . Depression   . GERD (gastroesophageal reflux disease)   . HCV (hepatitis C virus)    2018 COMPLETE VIROLOGIC RESPONSE  . High blood cholesterol level   . Hypertension   . Migraine   . Migraines   . Neuromuscular disorder ([place])   . Pneumonia   . PTSD (post-traumatic stress disorder)   . 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 Somnolence, daytime 06/13/2017  . Tobacco abuse 05/02/2017  . 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 Atherosclerotic peripheral vascular disease with intermittent claudication ([place]) 01/25/2017  . GERD (gastroesophageal reflux disease) 10/13/2015  . Chronic obstructive pulmonary disease ([place]) 07/14/2015  . Obstructive chronic bronchitis without exacerbation ([place]) 04/13/2015   . Headache, tension type, chronic 04/13/2015  . Anxiety state 02/27/2015  . FH: colon cancer 01/15/2015  . Hepatitis C virus infection resolved after antiviral drug therapy 01/15/2015  . Hyperlipidemia 11/26/2008  . UNSPECIFIED ANEMIA 11/26/2008  . MIGRAINE HEADACHE 11/11/2008  . Essential hypertension, benign 11/11/2008  . Osteoarthritis of hands, bilateral 11/11/2008    Past Surgical History:  Procedure Laterality Date  . BIOPSY  02/09/2015   Procedure: GASTRIC BIOPSIES ;  Surgeon: Danie Binder, MD;  Location: [facility] ORS;  Service: Endoscopy;;  . COLONOSCOPY WITH PROPOFOL N/A 02/09/2015   SLF: 1. the examined terminal ileum appeared to be normal 2. the colonic mucosa appeared normal 3. small internal hemorrhoids  . ESOPHAGOGASTRODUODENOSCOPY (EGD) WITH PROPOFOL N/A 02/09/2015   SLF: 1. stricture at the gastroesophageal junction 2. mild non-erosive gastritis   . GIVENS CAPSULE STUDY N/A 10/19/2016   Procedure: GIVENS CAPSULE STUDY;  Surgeon: Danie Binder, MD;  Location: [facility] ENDO SUITE;  Service: Endoscopy;  Laterality: N/A;  7:30am, pt to arrive at 7:00am  . left arm     ? nerve repaired, " it was pinched".  . left elbow surgery Left   . SAVORY DILATION N/A 02/09/2015   Procedure: SAVORY DILATION 12.70mm, 74mm, 58mm, 32mm;  Surgeon: Danie Binder, MD;  Location: [facility] ORS;  Service: Endoscopy;  Laterality: N/A;  . TONSILLECTOMY    . TUBAL LIGATION  OB History    Gravida  2   Para  1   Term      Preterm      AB  1   Living        SAB      TAB      Ectopic      Multiple      Live Births               Home Medications    Prior to Admission medications   Medication Sig Start Date End Date Taking? Authorizing Provider  albuterol (PROVENTIL) (2.5 MG/3ML) 0.083% nebulizer solution INHALE 1 VIAL VIA NEBULIZER EVERY 6 HOURS AS NEEDED FOR WHEEZING AND SHORTNESS OF BREATH 12/25/16   Soyla Dryer, PA-C  amLODipine (NORVASC) 5 MG tablet Take 1 tablet (5 mg total) by  mouth daily. 06/13/17   Raylene Everts, MD  Ascorbic Acid (VITAMIN C PO) Take 1 tablet by mouth daily.    [provider]  benzonatate (TESSALON) 200 MG capsule Take 1 capsule (200 mg total) by mouth 3 (three) times daily as needed for cough. 10/30/17   Raylene Everts, MD  budesonide-formoterol [facility]) 160-4.5 MCG/ACT inhaler Inhale 2 puffs into the lungs 2 (two) times daily. 05/02/17   Raylene Everts, MD  busPIRone (BUSPAR) 10 MG tablet Take 10 mg by mouth daily. 09/05/17   [provider]  CALCIUM-VITAMIN D PO Take 1 tablet by mouth daily.    [provider]  Ferrous Sulfate (IRON) 325 (65 Fe) MG TABS Take 1 tablet by mouth daily.    [provider]  FLUoxetine (PROZAC) 40 MG capsule Take 40 mg by mouth daily.  08/13/17   [provider]  furosemide (LASIX) 20 MG tablet TAKE ONE TABLET BY mouth daily 08/27/17   Raylene Everts, MD  HYDROcodone-acetaminophen (NORCO/VICODIN) 5-325 MG tablet Take 1 tablet by mouth every 6 (six) hours as needed for severe pain. 03/22/19   Keyvon Herter, [place] N, PA-C  LORazepam (ATIVAN) 0.5 MG tablet Take 0.5 mg by mouth every 12 (twelve) hours as needed for anxiety.     [provider]  losartan (COZAAR) 100 MG tablet Take 1 tablet (100 mg total) by mouth daily. 08/27/17   Raylene Everts, MD  meloxicam (MOBIC) 15 MG tablet Take 1 tablet (15 mg total) by mouth daily. 08/13/17   Raylene Everts, MD  methocarbamol (ROBAXIN) 500 MG tablet Take 2 tablets (1,000 mg total) by mouth 4 (four) times daily as needed for muscle spasms (muscle spasm/pain). 10/22/18   Francine Graven, DO  mirtazapine (REMERON) 15 MG tablet Take 15 mg by mouth at bedtime.    [provider]  Multiple Vitamin (MULTIVITAMIN) tablet Take 1 tablet by mouth daily.    [provider]  omeprazole (PRILOSEC) 20 MG capsule TAKE 1 CAPSULE BY MOUTH DAILY BEFORE A MEAL. 05/15/18   Mahala Menghini, PA-C  ondansetron (ZOFRAN-ODT)  8 MG disintegrating tablet Take 1 tablet (8 mg total) by mouth every 8 (eight) hours as needed for nausea or vomiting. 10/22/17   Raylene Everts, MD  predniSONE (DELTASONE) 20 MG tablet Take 1 tablet (20 mg total) by mouth 2 (two) times daily with a meal. 10/30/17   Raylene Everts, MD  propranolol (INDERAL) 80 MG tablet Take 1 tablet (80 mg total) by mouth 2 (two) times daily. 10/30/17   Raylene Everts, MD  simvastatin (ZOCOR) 40 MG tablet Take 1 tablet (40 mg total)  daily at 6 PM by mouth. 05/21/17   Raylene Everts, MD  SUMAtriptan (IMITREX) 50 MG tablet TAKE AT BEGINNING OF MIGRAINE. TAKE A SECOND DOSE IN 2 HOURS IF NEEDED. 10/04/17   Raylene Everts, MD  traZODone (DESYREL) 50 MG tablet Take 75 mg by mouth at bedtime as needed for sleep.     [provider]    Family History Family History  Problem Relation Age of Onset  . Heart failure Father   . Hypertension Father   . Stroke Father   . Colon cancer Father        greater than age 61  . Cancer Father   . Dementia Father   . Cancer Mother   . Seizures Sister     Social History Social History   Tobacco Use  . Smoking status: Current Every Day Smoker    Packs/day: 0.25    Years: 42.00    Pack years: 10.50    Types: Cigarettes    Start date: 09/29/1972  . Smokeless tobacco: Never Used  . Tobacco comment: about 2 cigs per day  Substance Use Topics  . Alcohol use: No    Alcohol/week: 0.0 standard drinks  . Drug use: No    Types: "Crack" cocaine    Comment: hx of smoking crack cocaine last 5-6 years ago     Allergies   Asa [aspirin]   Review of Systems Review of Systems  Musculoskeletal: Positive for arthralgias and joint swelling.  Skin: Negative for wound.  Neurological: Negative for syncope.     Physical Exam Updated Vital Signs BP (!) 168/83   Pulse 69   Temp 98.4 F (36.9 C) (Oral)   Resp 16   Ht 5\' 4"  (1.626 m)   Wt 55.3 kg   SpO2 97%   BMI 20.94 kg/m   Physical Exam  Vitals signs and nursing note reviewed.  Constitutional:      General: She is not in acute distress.    Appearance: She is well-developed.  HENT:     Head: Normocephalic and atraumatic.  Eyes:     Conjunctiva/sclera: Conjunctivae normal.  Cardiovascular:     Rate and Rhythm: Normal rate.  Pulmonary:     Effort: Pulmonary effort is normal.  Musculoskeletal:     Comments: Intact radial pulses. Dorsal right hand with swelling and tenderness over the fifth metacarpal.  Patient is able to actively move her digits without difficulty.  No anatomical snuffbox tenderness.  Patient is able to actively range the wrist without difficulty.  Elbow is nontender with normal range of motion.  No wounds.  Normal distal sensation to the digits.  Neurological:     Mental Status: She is alert.  Psychiatric:        Mood and Affect: Mood normal.        Behavior: Behavior normal.      ED Treatments / Results  Labs (all labs ordered are listed, but only abnormal results are displayed) Labs Reviewed - No data to display  EKG None  Radiology Dg Hand Complete Right  Result Date: 03/22/2019 CLINICAL DATA:  Patient with pain status post fall. EXAM: RIGHT HAND - COMPLETE 3+ VIEW COMPARISON:  None. FINDINGS: Nondisplaced oblique fracture through the distal aspect of the fifth metacarpal. No evidence for associated acute fractures. Swelling over the dorsum of the hand. IMPRESSION: Nondisplaced oblique fracture distal aspect of the fifth metacarpal. Electronically Signed   By: Lovey Newcomer M.D.   On: 03/22/2019 14:03  Procedures Procedures (including critical care time)  Medications Ordered in ED Medications - No data to display   Initial Impression / Assessment and Plan / ED Course  I have reviewed the triage vital signs and the nursing notes.  Pertinent labs & imaging results that were available during my care of the patient were reviewed by me and considered in my medical decision making (see  chart for details).        Patient with right hand pain after mechanical fall that occurred last night.  No head trauma or LOC.  Not on anticoagulation.  X-ray with nondisplaced oblique fracture of the distal fifth metacarpal.  It is closed.  Neurovascularly intact.  Placed in ulnar gutter splint will provide short course of pain medications for acute pain.  Ortho referral provided for follow-up.  Discussed patient's history of substance abuse.  She states she used to be addicted to crack cocaine.  Discussed high addiction profile with pain medication prescription, however patient states she is unable to control her pain at home with her prescribed medications and is aware of the risks.  Patient is agreeable to plan and safe for discharge.  [place] Controlled Substance reporting System queried  Discussed results, findings, treatment and follow up. Patient advised of return precautions. Patient verbalized understanding and agreed with plan.   Final Clinical Impressions(s) / ED Diagnoses   Final diagnoses:  Nondisplaced fracture of neck of fifth metacarpal bone, right hand, initial encounter for closed fracture    ED Discharge Orders         Ordered    HYDROcodone-acetaminophen (NORCO/VICODIN) 5-325 MG tablet  Every 6 hours PRN     03/22/19 1603           Shadaya Marschner, [place] N, PA-C 03/22/19 1720    Fredia Sorrow, MD 04/05/19 [phone]

## 2019-03-22 NOTE — Discharge Instructions (Signed)
Please read instructions below. Keep the splint in place, clean, and dry at all times.  Elevate your hand as much as possible. You can continue taking your meloxicam as prescribed for pain and swelling. You can take hydrocodone every 6 hours as needed for severe pain. Schedule an appointment with the orthopedic specialist in 1 to 2 weeks for repeat x-ray and follow-up on your injury. Return to the ER for new or concerning symptoms.

## 2019-03-26 ENCOUNTER — Telehealth: Payer: Self-pay | Admitting: Orthopaedic Surgery

## 2019-03-26 DIAGNOSIS — S62306S Unspecified fracture of fifth metacarpal bone, right hand, sequela: Secondary | ICD-10-CM | POA: Diagnosis not present

## 2019-03-26 DIAGNOSIS — W19XXXD Unspecified fall, subsequent encounter: Secondary | ICD-10-CM | POA: Diagnosis not present

## 2019-03-26 DIAGNOSIS — M79601 Pain in right arm: Secondary | ICD-10-CM | POA: Diagnosis not present

## 2019-03-26 NOTE — Telephone Encounter (Signed)
Call received this afternoon from patient on voice mail, message left requesting call back regarding "fall, and fractured fingers"; called patient back - reached her voice mail, left message.

## 2019-03-27 ENCOUNTER — Encounter: Payer: Self-pay | Admitting: Orthopaedic Surgery

## 2019-03-27 ENCOUNTER — Ambulatory Visit (INDEPENDENT_AMBULATORY_CARE_PROVIDER_SITE_OTHER): Payer: Medicare Other | Admitting: Orthopaedic Surgery

## 2019-03-27 ENCOUNTER — Other Ambulatory Visit: Payer: Self-pay

## 2019-03-27 VITALS — BP 173/98 | HR 65 | Temp 96.8°F | Ht 64.0 in | Wt 129.0 lb

## 2019-03-27 DIAGNOSIS — S62346A Nondisplaced fracture of base of fifth metacarpal bone, right hand, initial encounter for closed fracture: Secondary | ICD-10-CM | POA: Diagnosis not present

## 2019-03-27 DIAGNOSIS — F17210 Nicotine dependence, cigarettes, uncomplicated: Secondary | ICD-10-CM | POA: Diagnosis not present

## 2019-03-27 NOTE — Patient Instructions (Signed)

## 2019-03-27 NOTE — Telephone Encounter (Signed)
Patient had returned call this morning, 03/27/19, and was given a same-day appointment. Completed as scheduled.

## 2019-03-27 NOTE — Progress Notes (Signed)
Subjective:    Patient ID: Cynthia [name], female    DOB: [birth year], 68 y.o.   MRN: [id number]  HPI She fell on March 20, 2019 and hurt her right hand.  She tripped at home.  She was seen in the ER.  I have reviewed the ER records and the x-rays.  She was placed in a splint.  She has no other injury.  Her pain is controlled.   Review of Systems  Constitutional: Positive for activity change.  Musculoskeletal: Positive for arthralgias and joint swelling.  All other systems reviewed and are negative.  For Review of Systems, all other systems reviewed and are negative.  The following is a summary of the past history medically, past history surgically, known current medicines, social history and family history.  This information is gathered electronically by the computer from prior information and documentation.  I review this each visit and have found including this information at this point in the chart is beneficial and informative.   Past Medical History:  Diagnosis Date  . Allergy   . Anemia   . Angina pectoris ([place])   . Anxiety   . Arthritis   . Bronchitis   . Bronchitis   . COPD (chronic obstructive pulmonary disease) ([place])   . Depression   . GERD (gastroesophageal reflux disease)   . HCV (hepatitis C virus)    2018 COMPLETE VIROLOGIC RESPONSE  . High blood cholesterol level   . Hypertension   . Migraine   . Migraines   . Neuromuscular disorder ([place])   . Pneumonia   . PTSD (post-traumatic stress disorder)   . 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 BIOPSY  02/09/2015   Procedure: GASTRIC BIOPSIES ;  Surgeon: Danie Binder, MD;  Location: [facility] ORS;  Service: Endoscopy;;  . COLONOSCOPY WITH PROPOFOL N/A 02/09/2015   SLF: 1. the examined terminal ileum appeared to be normal 2. the colonic mucosa appeared normal 3. small internal hemorrhoids  . ESOPHAGOGASTRODUODENOSCOPY (EGD) WITH PROPOFOL N/A 02/09/2015   SLF: 1. stricture at the gastroesophageal junction 2. mild non-erosive gastritis   . GIVENS CAPSULE STUDY N/A 10/19/2016   Procedure: GIVENS CAPSULE STUDY;  Surgeon: Danie Binder, MD;  Location: [facility] ENDO SUITE;  Service: Endoscopy;  Laterality: N/A;  7:30am, pt to arrive at 7:00am  . left arm     ? nerve repaired, " it was pinched".  . left elbow surgery Left   . SAVORY DILATION N/A 02/09/2015   Procedure: SAVORY DILATION 12.28mm, 66mm, 82mm, 38mm;  Surgeon: Danie Binder, MD;  Location: [facility] ORS;  Service: Endoscopy;  Laterality: N/A;  . TONSILLECTOMY    . TUBAL LIGATION      Current Outpatient Medications on File Prior to Visit  Medication Sig Dispense Refill  . albuterol (PROVENTIL) (2.5 MG/3ML) 0.083% nebulizer solution INHALE 1 VIAL VIA NEBULIZER EVERY 6 HOURS AS NEEDED FOR WHEEZING AND SHORTNESS OF BREATH 150 mL 1  . amLODipine (NORVASC) 5 MG tablet Take 1 tablet (5 mg total) by mouth daily. 90 tablet 3  . Ascorbic Acid (VITAMIN C PO) Take 1 tablet by mouth daily.    . benzonatate (TESSALON) 200 MG capsule Take 1 capsule (200 mg total) by mouth 3 (three) times daily as needed for cough. 20 capsule 0  . budesonide-formoterol (SYMBICORT) 160-4.5 MCG/ACT inhaler Inhale 2 puffs into the lungs 2 (two) times daily. 1 Inhaler 11  . busPIRone (  BUSPAR) 10 MG tablet Take 10 mg by mouth daily.    Marland Kitchen CALCIUM-VITAMIN D PO Take 1 tablet by mouth daily.    . Ferrous Sulfate (IRON) 325 (65 Fe) MG TABS Take 1 tablet by mouth daily.    Marland Kitchen FLUoxetine (PROZAC) 40 MG capsule Take 40 mg by mouth daily.     . furosemide (LASIX) 20 MG tablet TAKE ONE TABLET BY mouth daily 90 tablet 0  . HYDROcodone-acetaminophen (NORCO/VICODIN) 5-325 MG tablet Take 1 tablet by mouth every 6 (six) hours as needed for severe pain. 6 tablet 0  . LORazepam (ATIVAN) 0.5 MG tablet Take 0.5 mg by mouth every 12 (twelve) hours as needed for anxiety.     Marland Kitchen losartan (COZAAR) 100 MG tablet Take 1 tablet (100 mg total) by mouth daily. 90  tablet 1  . meloxicam (MOBIC) 15 MG tablet Take 1 tablet (15 mg total) by mouth daily. 90 tablet 3  . methocarbamol (ROBAXIN) 500 MG tablet Take 2 tablets (1,000 mg total) by mouth 4 (four) times daily as needed for muscle spasms (muscle spasm/pain). 25 tablet 0  . mirtazapine (REMERON) 15 MG tablet Take 15 mg by mouth at bedtime.    . Multiple Vitamin (MULTIVITAMIN) tablet Take 1 tablet by mouth daily.    Marland Kitchen omeprazole (PRILOSEC) 20 MG capsule TAKE 1 CAPSULE BY MOUTH DAILY BEFORE A MEAL. 90 capsule 3  . ondansetron (ZOFRAN-ODT) 8 MG disintegrating tablet Take 1 tablet (8 mg total) by mouth every 8 (eight) hours as needed for nausea or vomiting. 20 tablet 0  . predniSONE (DELTASONE) 20 MG tablet Take 1 tablet (20 mg total) by mouth 2 (two) times daily with a meal. 10 tablet 0  . propranolol (INDERAL) 80 MG tablet Take 1 tablet (80 mg total) by mouth 2 (two) times daily. 180 tablet 1  . simvastatin (ZOCOR) 40 MG tablet Take 1 tablet (40 mg total) daily at 6 PM by mouth. 90 tablet 3  . SUMAtriptan (IMITREX) 50 MG tablet TAKE AT BEGINNING OF MIGRAINE. TAKE A SECOND DOSE IN 2 HOURS IF NEEDED. 10 tablet 0  . traZODone (DESYREL) 50 MG tablet Take 75 mg by mouth at bedtime as needed for sleep.      No current facility-administered medications on file prior to visit.     Social History   Socioeconomic History  . Marital status: Married    Spouse name: Cheno  . Number of children: 1  . Years of education: Not on file  . Highest education level: Not on file  Occupational History  . Occupation: disability    Comment: lung disease / COPD  Social Needs  . Financial resource strain: Not on file  . Food insecurity    Worry: Not on file    Inability: Not on file  . Transportation needs    Medical: Not on file    Non-medical: Not on file  Tobacco Use  . Smoking status: Current Every Day Smoker    Packs/day: 0.25    Years: 42.00    Pack years: 10.50    Types: Cigarettes    Start date: 09/29/1972   . Smokeless tobacco: Never Used  . Tobacco comment: about 2 cigs per day  Substance and Sexual Activity  . Alcohol use: No    Alcohol/week: 0.0 standard drinks  . Drug use: No    Types: "Crack" cocaine    Comment: hx of smoking crack cocaine last 5-6 years ago  . Sexual activity: Never  Birth control/protection: None  Lifestyle  . Physical activity    Days per week: Not on file    Minutes per session: Not on file  . Stress: Not on file  Relationships  . Social Herbalist on phone: Not on file    Gets together: Not on file    Attends religious service: Not on file    Active member of club or organization: Not on file    Attends meetings of clubs or organizations: Not on file    Relationship status: Not on file  . Intimate partner violence    Fear of current or ex partner: Not on file    Emotionally abused: Not on file    Physically abused: Not on file    Forced sexual activity: Not on file  Other Topics Concern  . Not on file  Social History Narrative   Married to [employer] for 28 years   Keep her sister - she has seizures/disabled    Family History  Problem Relation Age of Onset  . Heart failure Father   . Hypertension Father   . Stroke Father   . Colon cancer Father        greater than age 12  . Cancer Father   . Dementia Father   . Cancer Mother   . Seizures Sister     BP (!) 173/98   Pulse 65   Temp (!) 96.8 F (36 C)   Ht 5\' 4"  (1.626 m)   Wt 129 lb (58.5 kg)   BMI 22.14 kg/m   Body mass index is 22.14 kg/m.     Objective:   Physical Exam Vitals signs reviewed.  Constitutional:      Appearance: She is well-developed.  HENT:     Head: Normocephalic and atraumatic.  Eyes:     Conjunctiva/sclera: Conjunctivae normal.     Pupils: Pupils are equal, round, and reactive to light.  Neck:     Musculoskeletal: Normal range of motion and neck supple.  Cardiovascular:     Rate and Rhythm: Normal rate and regular rhythm.  Pulmonary:      Effort: Pulmonary effort is normal.  Abdominal:     Palpations: Abdomen is soft.  Musculoskeletal:       Hands:  Skin:    General: Skin is warm and dry.  Neurological:     Mental Status: She is alert and oriented to person, place, and time.     Cranial Nerves: No cranial nerve deficit.     Motor: No abnormal muscle tone.     Coordination: Coordination normal.     Deep Tendon Reflexes: Reflexes are normal and symmetric. Reflexes normal.  Psychiatric:        Behavior: Behavior normal.        Thought Content: Thought content normal.        Judgment: Judgment normal.           Assessment & Plan:   Encounter Diagnoses  Name Primary?  . Closed nondisplaced fracture of base of fifth metacarpal bone of right hand, initial encounter Yes  . Nicotine dependence, cigarettes, uncomplicated    I have placed her in a [place] splint and shown her how to use it.  Return Tuesday for brace check.  Call if any problem.  Precautions discussed.   Electronically Signed Sanjuana Kava, MD 9/17/202010:51 AM

## 2019-04-01 ENCOUNTER — Encounter: Payer: Self-pay | Admitting: Orthopaedic Surgery

## 2019-04-01 ENCOUNTER — Ambulatory Visit: Payer: Medicare Other

## 2019-04-01 ENCOUNTER — Other Ambulatory Visit: Payer: Self-pay

## 2019-04-01 ENCOUNTER — Ambulatory Visit (INDEPENDENT_AMBULATORY_CARE_PROVIDER_SITE_OTHER): Payer: Medicare Other | Admitting: Orthopaedic Surgery

## 2019-04-01 VITALS — BP 146/58 | HR 75 | Temp 97.1°F | Ht 64.0 in | Wt 128.0 lb

## 2019-04-01 DIAGNOSIS — S62346D Nondisplaced fracture of base of fifth metacarpal bone, right hand, subsequent encounter for fracture with routine healing: Secondary | ICD-10-CM

## 2019-04-01 NOTE — Progress Notes (Signed)
CC:  My dog ate my splint  Her dog ate her [place] splint.  She has tenderness of the right little finger, no swelling, ROM is good.  NV intact.  X-rays were done of the right hand, reported separately.  Encounter Diagnosis  Name Primary?  . Closed nondisplaced fracture of base of fifth metacarpal bone of right hand with routine healing, subsequent encounter Yes   She is placed in a new ulnar gutter splint.  Return in two weeks.  Xray out of splint then.  Call if any problem.  Precautions discussed.   Electronically Signed Sanjuana Kava, MD 9/22/202011:35 AM

## 2019-04-10 DIAGNOSIS — E782 Mixed hyperlipidemia: Secondary | ICD-10-CM | POA: Diagnosis not present

## 2019-04-10 DIAGNOSIS — I10 Essential (primary) hypertension: Secondary | ICD-10-CM | POA: Diagnosis not present

## 2019-04-10 DIAGNOSIS — D509 Iron deficiency anemia, unspecified: Secondary | ICD-10-CM | POA: Diagnosis not present

## 2019-04-10 DIAGNOSIS — J449 Chronic obstructive pulmonary disease, unspecified: Secondary | ICD-10-CM | POA: Diagnosis not present

## 2019-04-10 DIAGNOSIS — R944 Abnormal results of kidney function studies: Secondary | ICD-10-CM | POA: Diagnosis not present

## 2019-04-15 ENCOUNTER — Ambulatory Visit (INDEPENDENT_AMBULATORY_CARE_PROVIDER_SITE_OTHER): Payer: Medicare Other | Admitting: Orthopaedic Surgery

## 2019-04-15 ENCOUNTER — Other Ambulatory Visit: Payer: Self-pay

## 2019-04-15 ENCOUNTER — Ambulatory Visit: Payer: Medicare Other

## 2019-04-15 ENCOUNTER — Ambulatory Visit: Payer: Medicare Other | Admitting: Orthopaedic Surgery

## 2019-04-15 ENCOUNTER — Encounter: Payer: Self-pay | Admitting: Orthopaedic Surgery

## 2019-04-15 DIAGNOSIS — S62346D Nondisplaced fracture of base of fifth metacarpal bone, right hand, subsequent encounter for fracture with routine healing: Secondary | ICD-10-CM

## 2019-04-15 NOTE — Progress Notes (Signed)
CC:  My hand is better.  She has less pain of the right hand and full motion of the fingers.  X-rays were done of the right hand, reported separately.  Encounter Diagnosis  Name Primary?  . Closed nondisplaced fracture of base of fifth metacarpal bone of right hand with routine healing, subsequent encounter Yes   Return in three weeks.  Use hand more normally.  X-rays on return.  Call if any problem.  Precautions discussed.   Electronically Signed Sanjuana Kava, MD 10/6/202011:44 AM

## 2019-04-22 ENCOUNTER — Telehealth: Payer: Self-pay | Admitting: Cardiology

## 2019-04-22 NOTE — Telephone Encounter (Signed)

## 2019-04-24 ENCOUNTER — Ambulatory Visit: Payer: Medicare Other | Admitting: Cardiology

## 2019-04-24 ENCOUNTER — Encounter: Payer: Self-pay | Admitting: Cardiology

## 2019-04-24 ENCOUNTER — Other Ambulatory Visit: Payer: Self-pay

## 2019-04-24 ENCOUNTER — Encounter: Payer: Self-pay | Admitting: *Deleted

## 2019-04-24 VITALS — BP 132/79 | HR 70 | Ht 64.0 in | Wt 128.2 lb

## 2019-04-24 DIAGNOSIS — Z23 Encounter for immunization: Secondary | ICD-10-CM | POA: Diagnosis not present

## 2019-04-24 DIAGNOSIS — E782 Mixed hyperlipidemia: Secondary | ICD-10-CM

## 2019-04-24 DIAGNOSIS — R079 Chest pain, unspecified: Secondary | ICD-10-CM

## 2019-04-24 DIAGNOSIS — I10 Essential (primary) hypertension: Secondary | ICD-10-CM

## 2019-04-24 NOTE — Progress Notes (Signed)
Clinical Summary Cynthia [name] is a 68 y.o.female seen as new patient, last seen in 2016  1. Chest pain - last seen in 2016. History of chest pain at that time.  04/2014 Nuclear stress test without ischemia.  05/2017 echo LVEF 60-65%, no WMAs, grade I diastolic dysfunction, mild MR 10/2018 CT chest coronary calcifications of LAD and RCA, mild to mod aortic atherosclerosis   - recent chest started about 3 months ago. Sharp pain left sided, can occur at anytime. 7/10 in severity. Feels weak. No other assocaited symptoms. Worst with positon, worst with deep breathing. Lasts a few minutes. Occurs 1-2 times per month. Simialr to pain from a few years ago but more intense - DOE with housework which is chronic but mildly decreased from baseline.  - she has NG which seems to help pain.   CAD risk factors: HTN, HL, +smoker nearly 19 years, mother and father with heart troubles unknown specifics.    2. HTN - appears to be on both lisinopril and losartan  3. Hyperlipidemia - appears to be on both atorva and simva - she reports recent labs with pcp   4. COPD - followed by pcp     Past Medical History:  Diagnosis Date  . Allergy   . Anemia   . Angina pectoris ([place])   . Anxiety   . Arthritis   . Bronchitis   . Bronchitis   . COPD (chronic obstructive pulmonary disease) ([place])   . Depression   . GERD (gastroesophageal reflux disease)   . HCV (hepatitis C virus)    2018 COMPLETE VIROLOGIC RESPONSE  . High blood cholesterol level   . Hypertension   . Migraine   . Migraines   . Neuromuscular disorder ([place])   . Pneumonia   . PTSD (post-traumatic stress disorder)   . 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 Asa [Aspirin] Other (See Comments)    REACTION: Stomach upset     Current Outpatient Medications  Medication Sig Dispense Refill  . albuterol (PROVENTIL) (2.5 MG/3ML) 0.083% nebulizer solution INHALE 1 VIAL VIA NEBULIZER  EVERY 6 HOURS AS NEEDED FOR WHEEZING AND SHORTNESS OF BREATH 150 mL 1  . amLODipine (NORVASC) 5 MG tablet Take 1 tablet (5 mg total) by mouth daily. 90 tablet 3  . Ascorbic Acid (VITAMIN C PO) Take 1 tablet by mouth daily.    . budesonide-formoterol (SYMBICORT) 160-4.5 MCG/ACT inhaler Inhale 2 puffs into the lungs 2 (two) times daily. 1 Inhaler 11  . busPIRone (BUSPAR) 10 MG tablet Take 10 mg by mouth daily.    Marland Kitchen CALCIUM-VITAMIN D PO Take 1 tablet by mouth daily.    . Ferrous Sulfate (IRON) 325 (65 Fe) MG TABS Take 1 tablet by mouth daily.    Marland Kitchen FLUoxetine (PROZAC) 40 MG capsule Take 40 mg by mouth daily.     . furosemide (LASIX) 20 MG tablet TAKE ONE TABLET BY mouth daily (Patient not taking: Reported on 04/01/2019) 90 tablet 0  . HYDROcodone-acetaminophen (NORCO/VICODIN) 5-325 MG tablet Take 1 tablet by mouth every 6 (six) hours as needed for severe pain. 6 tablet 0  . LORazepam (ATIVAN) 0.5 MG tablet Take 0.5 mg by mouth every 12 (twelve) hours as needed for anxiety.     Marland Kitchen losartan (COZAAR) 100 MG tablet Take 1 tablet (100 mg total) by mouth daily. 90 tablet 1  . meloxicam (MOBIC) 15 MG tablet Take 1 tablet (15 mg  total) by mouth daily. 90 tablet 3  . methocarbamol (ROBAXIN) 500 MG tablet Take 2 tablets (1,000 mg total) by mouth 4 (four) times daily as needed for muscle spasms (muscle spasm/pain). 25 tablet 0  . mirtazapine (REMERON) 15 MG tablet Take 15 mg by mouth at bedtime.    . Multiple Vitamin (MULTIVITAMIN) tablet Take 1 tablet by mouth daily.    Marland Kitchen omeprazole (PRILOSEC) 20 MG capsule TAKE 1 CAPSULE BY MOUTH DAILY BEFORE A MEAL. 90 capsule 3  . ondansetron (ZOFRAN-ODT) 8 MG disintegrating tablet Take 1 tablet (8 mg total) by mouth every 8 (eight) hours as needed for nausea or vomiting. 20 tablet 0  . predniSONE (DELTASONE) 20 MG tablet Take 1 tablet (20 mg total) by mouth 2 (two) times daily with a meal. 10 tablet 0  . propranolol (INDERAL) 80 MG tablet Take 1 tablet (80 mg total) by mouth  2 (two) times daily. 180 tablet 1  . simvastatin (ZOCOR) 40 MG tablet Take 1 tablet (40 mg total) daily at 6 PM by mouth. 90 tablet 3  . SUMAtriptan (IMITREX) 50 MG tablet TAKE AT BEGINNING OF MIGRAINE. TAKE A SECOND DOSE IN 2 HOURS IF NEEDED. 10 tablet 0  . traZODone (DESYREL) 50 MG tablet Take 75 mg by mouth at bedtime as needed for sleep.      No current facility-administered medications for this visit.      Past Surgical History:  Procedure Laterality Date  . BIOPSY  02/09/2015   Procedure: GASTRIC BIOPSIES ;  Surgeon: Danie Binder, MD;  Location: [facility];  Service: Endoscopy;;  . COLONOSCOPY WITH PROPOFOL N/A 02/09/2015   SLF: 1. the examined terminal ileum appeared to be normal 2. the colonic mucosa appeared normal 3. small internal hemorrhoids  . ESOPHAGOGASTRODUODENOSCOPY (EGD) WITH PROPOFOL N/A 02/09/2015   SLF: 1. stricture at the gastroesophageal junction 2. mild non-erosive gastritis   . GIVENS CAPSULE STUDY N/A 10/19/2016   Procedure: GIVENS CAPSULE STUDY;  Surgeon: Danie Binder, MD;  Location: [facility];  Service: Endoscopy;  Laterality: N/A;  7:30am, pt to arrive at 7:00am  . left arm     ? nerve repaired, " it was pinched".  . left elbow surgery Left   . SAVORY DILATION N/A 02/09/2015   Procedure: SAVORY DILATION 12.50mm, 37mm, 63mm, 38mm;  Surgeon: Danie Binder, MD;  Location: [facility];  Service: Endoscopy;  Laterality: N/A;  . TONSILLECTOMY    . TUBAL LIGATION       Allergies  Allergen Reactions  . Asa [Aspirin] Other (See Comments)    REACTION: Stomach upset      Family History  Problem Relation Age of Onset  . Heart failure Father   . Hypertension Father   . Stroke Father   . Colon cancer Father        greater than age 19  . Cancer Father   . Dementia Father   . Cancer Mother   . Seizures Sister      Social History Cynthia [name] reports that she has been smoking cigarettes. She started smoking about 46 years ago. She has a 10.50 pack-year smoking  history. She has never used smokeless tobacco. Cynthia [name] reports no history of alcohol use.   Review of Systems CONSTITUTIONAL: No weight loss, fever, chills, weakness or fatigue.  HEENT: Eyes: No visual loss, blurred vision, double vision or yellow sclerae.No hearing loss, sneezing, congestion, runny nose or sore throat.  SKIN: No rash or itching.  CARDIOVASCULAR:  per hpi RESPIRATORY:per hpi GASTROINTESTINAL: No anorexia, nausea, vomiting or diarrhea. No abdominal pain or blood.  GENITOURINARY: No burning on urination, no polyuria NEUROLOGICAL: No headache, dizziness, syncope, paralysis, ataxia, numbness or tingling in the extremities. No change in bowel or bladder control.  MUSCULOSKELETAL: No muscle, back pain, joint pain or stiffness.  LYMPHATICS: No enlarged nodes. No history of splenectomy.  PSYCHIATRIC: No history of depression or anxiety.  ENDOCRINOLOGIC: No reports of sweating, cold or heat intolerance. No polyuria or polydipsia.  Marland Kitchen   Physical Examination Today's Vitals   04/24/19 0832 04/24/19 0840  BP: 124/74 132/79  Pulse: 72 70  SpO2: 99% 98%  Weight: 128 lb 3.2 oz (58.2 kg)   Height: 5\' 4"  (1.626 m)    Body mass index is 22.01 kg/m.  Gen: resting comfortably, no acute distress HEENT: no scleral icterus, pupils equal round and reactive, no palptable cervical adenopathy,  CV: RRR, 2/6 systolic murmur apex, no jvd Resp: Clear to auscultation bilaterally GI: abdomen is soft, non-tender, non-distended, normal bowel sounds, no hepatosplenomegaly MSK: extremities are warm, no edema.  Skin: warm, no rash Neuro:  no focal deficits Psych: appropriate affect   Diagnostic Studies  04/2014 MPI IMPRESSION: 1. No reversible ischemia or infarction.  2. Normal left ventricular wall motion.  3. Left ventricular ejection fraction 67%  4. Low-risk stress test findings*.   05/2017 echo Study Conclusions  - Left ventricle: The cavity size was normal. Wall  thickness was   normal. Systolic function was normal. The estimated ejection   fraction was in the range of 60% to 65%. Wall motion was normal;   there were no regional wall motion abnormalities. Doppler   parameters are consistent with abnormal left ventricular   relaxation (grade 1 diastolic dysfunction). - Aortic valve: Mildly calcified annulus. - Mitral valve: There was mild regurgitation. - Right atrium: Central venous pressure (est): 3 mm Hg. - Atrial septum: No defect or patent foramen ovale was identified. - Tricuspid valve: There was trivial regurgitation. - Pulmonary arteries: PA peak pressure: 20 mm Hg (S). - Pericardium, extracardiac: There was no pericardial effusion.  Impressions:  - Normal LV wall thickness with LVEF 60-65% and grade 1 diastolic   dysfunction. Mild mitral regurgitation. Trivial tricuspid   regurgitation with PASP 20 mmHg.  Assessment and Plan  1. Chest pain - long history of chest pains, negative stress test in 2015 - symptoms more intense since that time, some increased DOE. Somewhat atypical but reportedly improves with prn NG - multiple CAD risk factors, also coronary calcifications noted by recent CT chest as incidental finding - EKG today SR, no acute ischemic changes - plan for lexiscan to further evaluate  2. HTN - at goal. Stop ARB since she is on lisinopril  3. Hyperlipidemia - she has bottles for both simvastatin and atorvastatin, we have stopped her simvastatin  F/u 4 months virtual visit     Arnoldo Lenis, M.D.

## 2019-04-24 NOTE — Patient Instructions (Signed)
Your physician recommends that you schedule a follow-up appointment in: [place] has recommended you make the following change in your medication:   STOP SIMVASTATIN   STOP LOSARTAN   Your physician has requested that you have a lexiscan myoview. For further information please visit [email or web address]. Please follow instruction sheet, as given.  Thank you for choosing [facility]!!

## 2019-04-30 ENCOUNTER — Encounter (HOSPITAL_BASED_OUTPATIENT_CLINIC_OR_DEPARTMENT_OTHER)
Admission: RE | Admit: 2019-04-30 | Discharge: 2019-04-30 | Disposition: A | Discharge status: Home / Self Care | Payer: Medicare Other | Source: Ambulatory Visit | Attending: Cardiology | Admitting: Cardiology

## 2019-04-30 ENCOUNTER — Encounter (HOSPITAL_COMMUNITY)
Admission: RE | Admit: 2019-04-30 | Discharge: 2019-04-30 | Disposition: A | Discharge status: Home / Self Care | Payer: Medicare Other | Source: Ambulatory Visit | Attending: Cardiology | Admitting: Cardiology

## 2019-04-30 ENCOUNTER — Other Ambulatory Visit: Payer: Self-pay

## 2019-04-30 DIAGNOSIS — R079 Chest pain, unspecified: Secondary | ICD-10-CM | POA: Insufficient documentation

## 2019-04-30 LAB — NM MYOCAR MULTI W/SPECT W/WALL MOTION / EF
LV dias vol: 76 mL (ref 46–106)
LV sys vol: 24 mL
Peak HR: 98 {beats}/min
RATE: 0.32
Rest HR: 61 {beats}/min
SDS: 1
SRS: 1
SSS: 2
TID: 1.02

## 2019-04-30 MED ORDER — REGADENOSON 0.4 MG/5ML IV SOLN
INTRAVENOUS | Status: AC
  Administered 2019-04-30: 0.4 mg via INTRAVENOUS
  Filled 2019-04-30: qty 5

## 2019-04-30 MED ORDER — SODIUM CHLORIDE 0.9% FLUSH
INTRAVENOUS | Status: AC
  Administered 2019-04-30: 10 mL via INTRAVENOUS
  Filled 2019-04-30: qty 10

## 2019-04-30 MED ORDER — TECHNETIUM TC 99M TETROFOSMIN IV KIT
10.0000 | PACK | Freq: Once | INTRAVENOUS | Status: AC | PRN
  Administered 2019-04-30: 9 via INTRAVENOUS

## 2019-04-30 MED ORDER — TECHNETIUM TC 99M TETROFOSMIN IV KIT
30.0000 | PACK | Freq: Once | INTRAVENOUS | Status: AC | PRN
  Administered 2019-04-30: 31 via INTRAVENOUS

## 2019-05-02 DIAGNOSIS — I10 Essential (primary) hypertension: Secondary | ICD-10-CM | POA: Diagnosis not present

## 2019-05-02 DIAGNOSIS — M542 Cervicalgia: Secondary | ICD-10-CM | POA: Diagnosis not present

## 2019-05-02 DIAGNOSIS — M25511 Pain in right shoulder: Secondary | ICD-10-CM | POA: Diagnosis not present

## 2019-05-05 ENCOUNTER — Telehealth: Payer: Self-pay | Admitting: *Deleted

## 2019-05-05 NOTE — Telephone Encounter (Signed)
-----   Message from Arnoldo Lenis, MD sent at 05/02/2019  4:04 PM EDT ----- Normal stress test, no signs of any significant blockages   J BrancH MD

## 2019-05-05 NOTE — Telephone Encounter (Signed)
Pt aware - routed to pcp  

## 2019-05-06 ENCOUNTER — Ambulatory Visit: Payer: Medicare Other

## 2019-05-06 ENCOUNTER — Encounter: Payer: Self-pay | Admitting: Orthopaedic Surgery

## 2019-05-06 ENCOUNTER — Ambulatory Visit (INDEPENDENT_AMBULATORY_CARE_PROVIDER_SITE_OTHER): Payer: Medicare Other | Admitting: Orthopaedic Surgery

## 2019-05-06 ENCOUNTER — Other Ambulatory Visit: Payer: Self-pay

## 2019-05-06 DIAGNOSIS — S62346D Nondisplaced fracture of base of fifth metacarpal bone, right hand, subsequent encounter for fracture with routine healing: Secondary | ICD-10-CM

## 2019-05-06 NOTE — Progress Notes (Signed)
CC:  My hand does not hurt  She has no pain of the right hand.  She has full ROM.  NV intact.  Grips normal.  X-rays were done of the right hand, reported separately.  Encounter Diagnosis  Name Primary?  . Closed nondisplaced fracture of base of fifth metacarpal bone of right hand with routine healing, subsequent encounter Yes   Discharge.  Call if any problem.  Precautions discussed.   Electronically Signed Sanjuana Kava, MD 10/27/202010:24 AM

## 2019-05-23 ENCOUNTER — Other Ambulatory Visit: Payer: Self-pay

## 2019-05-23 ENCOUNTER — Encounter: Payer: Self-pay | Admitting: Cardiology

## 2019-05-23 ENCOUNTER — Telehealth (INDEPENDENT_AMBULATORY_CARE_PROVIDER_SITE_OTHER): Payer: Medicare Other | Admitting: Cardiology

## 2019-05-23 VITALS — Ht 64.0 in | Wt 123.0 lb

## 2019-05-23 DIAGNOSIS — R0789 Other chest pain: Secondary | ICD-10-CM | POA: Diagnosis not present

## 2019-05-23 NOTE — Progress Notes (Signed)
Virtual Visit via Telephone Note   This visit type was conducted due to national recommendations for restrictions regarding the COVID-19 Pandemic (e.g. social distancing) in an effort to limit this patient's exposure and mitigate transmission in our community.  Due to her co-morbid illnesses, this patient is at least at moderate risk for complications without adequate follow up.  This format is felt to be most appropriate for this patient at this time.  The patient did not have access to video technology/had technical difficulties with video requiring transitioning to audio format only (telephone).  All issues noted in this document were discussed and addressed.  No physical exam could be performed with this format.  Please refer to the patient's chart for her  consent to telehealth for [facility].   Date:  05/23/2019   ID:  Cynthia [name], Cynthia [name] 11/14/1950, MRN [id number]  Patient Location: Home Provider Location: Office  PCP:  Celene Squibb, MD  Cardiologist:  Carlyle Dolly, MD  Electrophysiologist:  None   Evaluation Performed:  Follow-Up Visit  Chief Complaint:  Chest pain  History of Present Illness:    Cynthia [name] is a 68 y.o. female seen today for a focused visit for recent issues with chest pain and f/u after recent stress test. For more detailed history please refer to prior clinic notes.    1. Chest pain - last seen in 2016. History of chest pain at that time.  04/2014 Nuclear stress test without ischemia.  05/2017 echo LVEF 60-65%, no WMAs, grade I diastolic dysfunction, mild MR 10/2018 CT chest coronary calcifications of LAD and RCA, mild to mod aortic atherosclerosis   - recent chest started about 3 months ago. Sharp pain left sided, can occur at anytime. 7/10 in severity. Feels weak. No other assocaited symptoms. Worst with positon, worst with deep breathing. Lasts a few minutes. Occurs 1-2 times per month. Simialr to pain from a few years ago but more  intense - DOE with housework which is chronic but mildly decreased from baseline.  - she has NG which seems to help pain.   CAD risk factors: HTN, HL, +smoker nearly 23 years, mother and father with heart troubles unknown specifics.    04/2019 nuclear stress: no ischemia - chest pain at times since last visit. Better with NG. Episodes about 1-2 times weeks, less frequent.  - ASA causes stomach upset.   The patient does not have symptoms concerning for COVID-19 infection (fever, chills, cough, or new shortness of breath).    Past Medical History:  Diagnosis Date  . Allergy   . Anemia   . Angina pectoris ([place])   . Anxiety   . Arthritis   . Bronchitis   . Bronchitis   . COPD (chronic obstructive pulmonary disease) ([place])   . Depression   . GERD (gastroesophageal reflux disease)   . HCV (hepatitis C virus)    2018 COMPLETE VIROLOGIC RESPONSE  . High blood cholesterol level   . Hypertension   . Migraine   . Migraines   . Neuromuscular disorder ([place])   . Pneumonia   . PTSD (post-traumatic stress disorder)   . 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 BIOPSY  02/09/2015   Procedure: GASTRIC BIOPSIES ;  Surgeon: Danie Binder, MD;  Location: [facility];  Service: Endoscopy;;  . COLONOSCOPY WITH PROPOFOL N/A 02/09/2015   SLF: 1. the examined terminal ileum appeared to be normal 2. the  colonic mucosa appeared normal 3. small internal hemorrhoids  . ESOPHAGOGASTRODUODENOSCOPY (EGD) WITH PROPOFOL N/A 02/09/2015   SLF: 1. stricture at the gastroesophageal junction 2. mild non-erosive gastritis   . GIVENS CAPSULE STUDY N/A 10/19/2016   Procedure: GIVENS CAPSULE STUDY;  Surgeon: Danie Binder, MD;  [facility];  Service: Endoscopy;  Laterality: N/A;  7:30am, pt to arrive at 7:00am  . left arm     ? nerve repaired, " it was pinched".  . left elbow surgery Left   . SAVORY DILATION N/A 02/09/2015   Procedure: SAVORY  DILATION 12.24mm, 54mm, 38mm, 81mm;  Surgeon: Danie Binder, MD;  Location: [facility];  Service: Endoscopy;  Laterality: N/A;  . TONSILLECTOMY    . TUBAL LIGATION       Current Meds  Medication Sig  . albuterol (PROVENTIL) (2.5 MG/3ML) 0.083% nebulizer solution INHALE 1 VIAL VIA NEBULIZER EVERY 6 HOURS AS NEEDED FOR WHEEZING AND SHORTNESS OF BREATH  . albuterol (VENTOLIN HFA) 108 (90 Base) MCG/ACT inhaler Inhale 1 puff into the lungs every 6 (six) hours as needed.  . Ascorbic Acid (VITAMIN C PO) Take 1 tablet by mouth daily.  Marland Kitchen atorvastatin (LIPITOR) 40 MG tablet Take 1 tablet by mouth daily.  . budesonide-formoterol (SYMBICORT) 160-4.5 MCG/ACT inhaler Inhale 2 puffs into the lungs 2 (two) times daily.  . butalbital-acetaminophen-caffeine (FIORICET) 50-325-40 MG tablet Take 1 tablet by mouth 2 (two) times daily as needed for headache.  Marland Kitchen CALCIUM-VITAMIN D PO Take 1 tablet by mouth daily.  . Ferrous Sulfate (IRON) 325 (65 Fe) MG TABS Take 1 tablet by mouth daily.  Marland Kitchen FLUoxetine (PROZAC) 40 MG capsule Take 40 mg by mouth daily.   . furosemide (LASIX) 20 MG tablet TAKE ONE TABLET BY mouth daily  . lisinopril (ZESTRIL) 40 MG tablet Take 1 tablet by mouth daily.  Marland Kitchen LORazepam (ATIVAN) 0.5 MG tablet Take 0.5 mg by mouth every 12 (twelve) hours as needed for anxiety.   . meloxicam (MOBIC) 15 MG tablet Take 1 tablet (15 mg total) by mouth daily. (Patient taking differently: Take 15 mg by mouth daily as needed. )  . methocarbamol (ROBAXIN) 500 MG tablet Take 2 tablets (1,000 mg total) by mouth 4 (four) times daily as needed for muscle spasms (muscle spasm/pain). (Patient taking differently: Take 500 mg by mouth 2 (two) times daily as needed for muscle spasms (muscle spasm/pain). )  . mirtazapine (REMERON) 15 MG tablet Take 15 mg by mouth at bedtime.  . Multiple Vitamin (MULTIVITAMIN) tablet Take 1 tablet by mouth daily.  Marland Kitchen omeprazole (PRILOSEC) 20 MG capsule TAKE 1 CAPSULE BY MOUTH DAILY BEFORE A MEAL.  Marland Kitchen  propranolol (INDERAL) 80 MG tablet Take 1 tablet (80 mg total) by mouth 2 (two) times daily.  Marland Kitchen tiZANidine (ZANAFLEX) 2 MG tablet Take 1-2 tablets by mouth every 8 (eight) hours as needed.  . traZODone (DESYREL) 50 MG tablet Take 75 mg by mouth at bedtime as needed for sleep.      Allergies:   Asa [aspirin] and Ibuprofen   Social History   Tobacco Use  . Smoking status: Current Every Day Smoker    Packs/day: 0.25    Years: 42.00    Pack years: 10.50    Types: Cigarettes    Start date: 09/29/1972  . Smokeless tobacco: Never Used  . Tobacco comment: about 2 cigs per day  Substance Use Topics  . Alcohol use: No    Alcohol/week: 0.0 standard drinks  . Drug  use: No    Types: "Crack" cocaine    Comment: hx of smoking crack cocaine last 5-6 years ago     Family Hx: The patient's family history includes Cancer in her father and mother; Colon cancer in her father; Dementia in her father; Heart failure in her father; Hypertension in her father; Seizures in her sister; Stroke in her father.  ROS:   Please see the history of present illness.     All other systems reviewed and are negative.   Prior CV studies:   The following studies were reviewed today:   Labs/Other Tests and Data Reviewed:    EKG:  No ECG reviewed.  Recent Labs: 10/22/2018: BUN 16; Creatinine, Ser 0.92; Hemoglobin 10.6; Platelets 225; Potassium 3.8; Sodium 139   Recent Lipid Panel Lab Results  Component Value Date/Time   CHOL 211 (H) 05/02/2017 11:37 AM   TRIG 176 (H) 05/02/2017 11:37 AM   HDL 44 (L) 05/02/2017 11:37 AM   CHOLHDL 4.8 05/02/2017 11:37 AM   LDLCALC 136 (H) 05/02/2017 11:37 AM    Wt Readings from Last 3 Encounters:  05/23/19 123 lb (55.8 kg)  04/24/19 128 lb 3.2 oz (58.2 kg)  04/01/19 128 lb (58.1 kg)     Objective:    Vital Signs:  Ht 5\' 4"  (1.626 m)   Wt 123 lb (55.8 kg)   BMI 21.11 kg/m    Normal affect. Normal speech pattern and tone. Comfortable, no apparent distress. No  audible signs of SOB or wheezing.   ASSESSMENT & PLAN:    1. Chest pain - several year history of chest pains.  - most recent symptoms atypical but doe to multiple CAD risk factors and recent chest CT showing some coronary calcifications we referred for a nuclear stress test that came back without ischemia - symptoms are less frequent, remain atypical. Continue to monitor at this time - she has ASA allergy. I don't see a strong enough indication to use plavix as an alternative as she hasonly had CAD by imaging but has not had clinically relevant disease.   COVID-19 Education: The signs and symptoms of COVID-19 were discussed with the patient and how to seek care for testing (follow up with PCP or arrange E-visit).  The importance of social distancing was discussed today.  Time:   Today, I have spent 10 minutes with the patient with telehealth technology discussing the above problems.     Medication Adjustments/Labs and Tests Ordered: Current medicines are reviewed at length with the patient today.  Concerns regarding medicines are outlined above.   Tests Ordered: No orders of the defined types were placed in this encounter.   Medication Changes: No orders of the defined types were placed in this encounter.   Follow Up:  In Person in 6 month(s)  Signed, Carlyle Dolly, MD  05/23/2019 1:22 PM

## 2019-05-23 NOTE — Patient Instructions (Signed)

## 2019-05-27 DIAGNOSIS — I2510 Atherosclerotic heart disease of native coronary artery without angina pectoris: Secondary | ICD-10-CM | POA: Diagnosis not present

## 2019-05-27 DIAGNOSIS — I739 Peripheral vascular disease, unspecified: Secondary | ICD-10-CM | POA: Diagnosis not present

## 2019-05-27 DIAGNOSIS — G43709 Chronic migraine without aura, not intractable, without status migrainosus: Secondary | ICD-10-CM | POA: Diagnosis not present

## 2019-05-27 DIAGNOSIS — M25511 Pain in right shoulder: Secondary | ICD-10-CM | POA: Diagnosis not present

## 2019-05-27 DIAGNOSIS — J449 Chronic obstructive pulmonary disease, unspecified: Secondary | ICD-10-CM | POA: Diagnosis not present

## 2019-05-29 DIAGNOSIS — I10 Essential (primary) hypertension: Secondary | ICD-10-CM | POA: Diagnosis not present

## 2019-05-29 DIAGNOSIS — E782 Mixed hyperlipidemia: Secondary | ICD-10-CM | POA: Diagnosis not present

## 2019-05-29 DIAGNOSIS — J449 Chronic obstructive pulmonary disease, unspecified: Secondary | ICD-10-CM | POA: Diagnosis not present

## 2019-06-12 DIAGNOSIS — E782 Mixed hyperlipidemia: Secondary | ICD-10-CM | POA: Diagnosis not present

## 2019-06-12 DIAGNOSIS — J449 Chronic obstructive pulmonary disease, unspecified: Secondary | ICD-10-CM | POA: Diagnosis not present

## 2019-06-12 DIAGNOSIS — I10 Essential (primary) hypertension: Secondary | ICD-10-CM | POA: Diagnosis not present

## 2019-06-26 DIAGNOSIS — M542 Cervicalgia: Secondary | ICD-10-CM | POA: Diagnosis not present

## 2019-06-26 DIAGNOSIS — I10 Essential (primary) hypertension: Secondary | ICD-10-CM | POA: Diagnosis not present

## 2019-06-26 DIAGNOSIS — M25511 Pain in right shoulder: Secondary | ICD-10-CM | POA: Diagnosis not present

## 2019-06-30 ENCOUNTER — Other Ambulatory Visit: Payer: Self-pay

## 2019-06-30 ENCOUNTER — Other Ambulatory Visit (HOSPITAL_COMMUNITY): Payer: Self-pay | Admitting: Neurology

## 2019-06-30 ENCOUNTER — Ambulatory Visit (HOSPITAL_COMMUNITY)
Admission: RE | Admit: 2019-06-30 | Discharge: 2019-06-30 | Disposition: A | Discharge status: Home / Self Care | Payer: Medicare Other | Source: Ambulatory Visit | Attending: Neurology | Admitting: Neurology

## 2019-06-30 DIAGNOSIS — M1611 Unilateral primary osteoarthritis, right hip: Secondary | ICD-10-CM | POA: Diagnosis not present

## 2019-06-30 DIAGNOSIS — M25511 Pain in right shoulder: Secondary | ICD-10-CM

## 2019-08-07 DIAGNOSIS — M545 Low back pain: Secondary | ICD-10-CM | POA: Diagnosis not present

## 2019-08-07 DIAGNOSIS — E782 Mixed hyperlipidemia: Secondary | ICD-10-CM | POA: Diagnosis not present

## 2019-08-07 DIAGNOSIS — E7849 Other hyperlipidemia: Secondary | ICD-10-CM | POA: Diagnosis not present

## 2019-08-07 DIAGNOSIS — J449 Chronic obstructive pulmonary disease, unspecified: Secondary | ICD-10-CM | POA: Diagnosis not present

## 2019-08-07 DIAGNOSIS — M25511 Pain in right shoulder: Secondary | ICD-10-CM | POA: Diagnosis not present

## 2019-08-07 DIAGNOSIS — I10 Essential (primary) hypertension: Secondary | ICD-10-CM | POA: Diagnosis not present

## 2019-08-11 DIAGNOSIS — I10 Essential (primary) hypertension: Secondary | ICD-10-CM | POA: Diagnosis not present

## 2019-08-11 DIAGNOSIS — R944 Abnormal results of kidney function studies: Secondary | ICD-10-CM | POA: Diagnosis not present

## 2019-08-11 DIAGNOSIS — E782 Mixed hyperlipidemia: Secondary | ICD-10-CM | POA: Diagnosis not present

## 2019-08-11 DIAGNOSIS — E7849 Other hyperlipidemia: Secondary | ICD-10-CM | POA: Diagnosis not present

## 2019-08-11 DIAGNOSIS — J449 Chronic obstructive pulmonary disease, unspecified: Secondary | ICD-10-CM | POA: Diagnosis not present

## 2019-09-01 DIAGNOSIS — D509 Iron deficiency anemia, unspecified: Secondary | ICD-10-CM | POA: Diagnosis not present

## 2019-09-01 DIAGNOSIS — Z0000 Encounter for general adult medical examination without abnormal findings: Secondary | ICD-10-CM | POA: Diagnosis not present

## 2019-09-01 DIAGNOSIS — F332 Major depressive disorder, recurrent severe without psychotic features: Secondary | ICD-10-CM | POA: Diagnosis not present

## 2019-09-01 DIAGNOSIS — G43709 Chronic migraine without aura, not intractable, without status migrainosus: Secondary | ICD-10-CM | POA: Diagnosis not present

## 2019-09-01 DIAGNOSIS — M25511 Pain in right shoulder: Secondary | ICD-10-CM | POA: Diagnosis not present

## 2019-09-01 DIAGNOSIS — R944 Abnormal results of kidney function studies: Secondary | ICD-10-CM | POA: Diagnosis not present

## 2019-09-01 DIAGNOSIS — Z712 Person consulting for explanation of examination or test findings: Secondary | ICD-10-CM | POA: Diagnosis not present

## 2019-09-01 DIAGNOSIS — L989 Disorder of the skin and subcutaneous tissue, unspecified: Secondary | ICD-10-CM | POA: Diagnosis not present

## 2019-09-01 DIAGNOSIS — E7849 Other hyperlipidemia: Secondary | ICD-10-CM | POA: Diagnosis not present

## 2019-09-04 DIAGNOSIS — K5900 Constipation, unspecified: Secondary | ICD-10-CM | POA: Diagnosis not present

## 2019-09-04 DIAGNOSIS — G4700 Insomnia, unspecified: Secondary | ICD-10-CM | POA: Diagnosis not present

## 2019-09-04 DIAGNOSIS — J449 Chronic obstructive pulmonary disease, unspecified: Secondary | ICD-10-CM | POA: Diagnosis not present

## 2019-09-04 DIAGNOSIS — M25511 Pain in right shoulder: Secondary | ICD-10-CM | POA: Diagnosis not present

## 2019-09-04 DIAGNOSIS — R944 Abnormal results of kidney function studies: Secondary | ICD-10-CM | POA: Diagnosis not present

## 2019-09-04 DIAGNOSIS — D509 Iron deficiency anemia, unspecified: Secondary | ICD-10-CM | POA: Diagnosis not present

## 2019-09-04 DIAGNOSIS — E782 Mixed hyperlipidemia: Secondary | ICD-10-CM | POA: Diagnosis not present

## 2019-09-04 DIAGNOSIS — K219 Gastro-esophageal reflux disease without esophagitis: Secondary | ICD-10-CM | POA: Diagnosis not present

## 2019-09-04 DIAGNOSIS — I10 Essential (primary) hypertension: Secondary | ICD-10-CM | POA: Diagnosis not present

## 2019-09-08 ENCOUNTER — Other Ambulatory Visit (HOSPITAL_COMMUNITY): Payer: Self-pay | Admitting: Internal Medicine

## 2019-09-08 ENCOUNTER — Other Ambulatory Visit: Payer: Self-pay | Admitting: Internal Medicine

## 2019-09-08 DIAGNOSIS — R7401 Elevation of levels of liver transaminase levels: Secondary | ICD-10-CM

## 2019-09-08 DIAGNOSIS — R630 Anorexia: Secondary | ICD-10-CM

## 2019-09-08 DIAGNOSIS — R634 Abnormal weight loss: Secondary | ICD-10-CM

## 2019-09-08 DIAGNOSIS — R109 Unspecified abdominal pain: Secondary | ICD-10-CM

## 2019-09-09 DIAGNOSIS — R944 Abnormal results of kidney function studies: Secondary | ICD-10-CM | POA: Diagnosis not present

## 2019-09-09 DIAGNOSIS — E782 Mixed hyperlipidemia: Secondary | ICD-10-CM | POA: Diagnosis not present

## 2019-09-09 DIAGNOSIS — J449 Chronic obstructive pulmonary disease, unspecified: Secondary | ICD-10-CM | POA: Diagnosis not present

## 2019-09-09 DIAGNOSIS — I10 Essential (primary) hypertension: Secondary | ICD-10-CM | POA: Diagnosis not present

## 2019-09-18 DIAGNOSIS — R944 Abnormal results of kidney function studies: Secondary | ICD-10-CM | POA: Diagnosis not present

## 2019-09-18 DIAGNOSIS — Z712 Person consulting for explanation of examination or test findings: Secondary | ICD-10-CM | POA: Diagnosis not present

## 2019-09-18 DIAGNOSIS — D509 Iron deficiency anemia, unspecified: Secondary | ICD-10-CM | POA: Diagnosis not present

## 2019-09-18 DIAGNOSIS — M25511 Pain in right shoulder: Secondary | ICD-10-CM | POA: Diagnosis not present

## 2019-09-18 DIAGNOSIS — R1010 Upper abdominal pain, unspecified: Secondary | ICD-10-CM | POA: Diagnosis not present

## 2019-09-18 DIAGNOSIS — Z0000 Encounter for general adult medical examination without abnormal findings: Secondary | ICD-10-CM | POA: Diagnosis not present

## 2019-09-18 DIAGNOSIS — K5900 Constipation, unspecified: Secondary | ICD-10-CM | POA: Diagnosis not present

## 2019-09-18 DIAGNOSIS — G43709 Chronic migraine without aura, not intractable, without status migrainosus: Secondary | ICD-10-CM | POA: Diagnosis not present

## 2019-09-18 DIAGNOSIS — R634 Abnormal weight loss: Secondary | ICD-10-CM | POA: Diagnosis not present

## 2019-09-18 DIAGNOSIS — E7849 Other hyperlipidemia: Secondary | ICD-10-CM | POA: Diagnosis not present

## 2019-09-18 DIAGNOSIS — K219 Gastro-esophageal reflux disease without esophagitis: Secondary | ICD-10-CM | POA: Diagnosis not present

## 2019-09-18 DIAGNOSIS — L989 Disorder of the skin and subcutaneous tissue, unspecified: Secondary | ICD-10-CM | POA: Diagnosis not present

## 2019-09-18 DIAGNOSIS — R7401 Elevation of levels of liver transaminase levels: Secondary | ICD-10-CM | POA: Diagnosis not present

## 2019-09-18 DIAGNOSIS — F332 Major depressive disorder, recurrent severe without psychotic features: Secondary | ICD-10-CM | POA: Diagnosis not present

## 2019-09-18 DIAGNOSIS — J449 Chronic obstructive pulmonary disease, unspecified: Secondary | ICD-10-CM | POA: Diagnosis not present

## 2019-09-25 ENCOUNTER — Other Ambulatory Visit: Payer: Self-pay

## 2019-09-25 ENCOUNTER — Ambulatory Visit (HOSPITAL_COMMUNITY)
Admission: RE | Admit: 2019-09-25 | Discharge: 2019-09-25 | Disposition: A | Discharge status: Home / Self Care | Payer: Medicare HMO | Source: Ambulatory Visit | Attending: Internal Medicine | Admitting: Internal Medicine

## 2019-09-25 DIAGNOSIS — R630 Anorexia: Secondary | ICD-10-CM | POA: Insufficient documentation

## 2019-09-25 DIAGNOSIS — R7401 Elevation of levels of liver transaminase levels: Secondary | ICD-10-CM | POA: Insufficient documentation

## 2019-09-25 DIAGNOSIS — R109 Unspecified abdominal pain: Secondary | ICD-10-CM | POA: Diagnosis not present

## 2019-09-25 DIAGNOSIS — R634 Abnormal weight loss: Secondary | ICD-10-CM | POA: Diagnosis not present

## 2019-09-25 LAB — POCT I-STAT CREATININE: Creatinine, Ser: 0.9 mg/dL (ref 0.44–1.00)

## 2019-09-25 MED ORDER — IOHEXOL 300 MG/ML  SOLN
75.0000 mL | Freq: Once | INTRAMUSCULAR | Status: AC | PRN
  Administered 2019-09-25: 75 mL via INTRAVENOUS

## 2019-10-09 DIAGNOSIS — E7849 Other hyperlipidemia: Secondary | ICD-10-CM | POA: Diagnosis not present

## 2019-10-09 DIAGNOSIS — J449 Chronic obstructive pulmonary disease, unspecified: Secondary | ICD-10-CM | POA: Diagnosis not present

## 2019-10-09 DIAGNOSIS — E782 Mixed hyperlipidemia: Secondary | ICD-10-CM | POA: Diagnosis not present

## 2019-10-09 DIAGNOSIS — R944 Abnormal results of kidney function studies: Secondary | ICD-10-CM | POA: Diagnosis not present

## 2019-10-09 DIAGNOSIS — I10 Essential (primary) hypertension: Secondary | ICD-10-CM | POA: Diagnosis not present

## 2019-11-03 DIAGNOSIS — I10 Essential (primary) hypertension: Secondary | ICD-10-CM | POA: Diagnosis not present

## 2019-11-03 DIAGNOSIS — G4489 Other headache syndrome: Secondary | ICD-10-CM | POA: Diagnosis not present

## 2019-11-03 DIAGNOSIS — F321 Major depressive disorder, single episode, moderate: Secondary | ICD-10-CM | POA: Diagnosis not present

## 2019-11-03 DIAGNOSIS — M25519 Pain in unspecified shoulder: Secondary | ICD-10-CM | POA: Diagnosis not present

## 2019-11-03 DIAGNOSIS — M542 Cervicalgia: Secondary | ICD-10-CM | POA: Diagnosis not present

## 2019-11-03 DIAGNOSIS — G4700 Insomnia, unspecified: Secondary | ICD-10-CM | POA: Diagnosis not present

## 2019-11-03 DIAGNOSIS — M25511 Pain in right shoulder: Secondary | ICD-10-CM | POA: Diagnosis not present

## 2019-11-13 DIAGNOSIS — K219 Gastro-esophageal reflux disease without esophagitis: Secondary | ICD-10-CM | POA: Diagnosis not present

## 2019-11-13 DIAGNOSIS — K5900 Constipation, unspecified: Secondary | ICD-10-CM | POA: Diagnosis not present

## 2019-11-13 DIAGNOSIS — D509 Iron deficiency anemia, unspecified: Secondary | ICD-10-CM | POA: Diagnosis not present

## 2019-11-13 DIAGNOSIS — F332 Major depressive disorder, recurrent severe without psychotic features: Secondary | ICD-10-CM | POA: Diagnosis not present

## 2019-11-13 DIAGNOSIS — F331 Major depressive disorder, recurrent, moderate: Secondary | ICD-10-CM | POA: Diagnosis not present

## 2019-11-13 DIAGNOSIS — E782 Mixed hyperlipidemia: Secondary | ICD-10-CM | POA: Diagnosis not present

## 2019-11-13 DIAGNOSIS — M79671 Pain in right foot: Secondary | ICD-10-CM | POA: Diagnosis not present

## 2019-11-13 DIAGNOSIS — E7849 Other hyperlipidemia: Secondary | ICD-10-CM | POA: Diagnosis not present

## 2019-11-13 DIAGNOSIS — R7401 Elevation of levels of liver transaminase levels: Secondary | ICD-10-CM | POA: Diagnosis not present

## 2019-11-13 DIAGNOSIS — Z0000 Encounter for general adult medical examination without abnormal findings: Secondary | ICD-10-CM | POA: Diagnosis not present

## 2019-11-13 DIAGNOSIS — J449 Chronic obstructive pulmonary disease, unspecified: Secondary | ICD-10-CM | POA: Diagnosis not present

## 2019-11-13 DIAGNOSIS — R634 Abnormal weight loss: Secondary | ICD-10-CM | POA: Diagnosis not present

## 2019-11-13 DIAGNOSIS — R1010 Upper abdominal pain, unspecified: Secondary | ICD-10-CM | POA: Diagnosis not present

## 2019-11-13 DIAGNOSIS — E43 Unspecified severe protein-calorie malnutrition: Secondary | ICD-10-CM | POA: Diagnosis not present

## 2019-11-13 DIAGNOSIS — F419 Anxiety disorder, unspecified: Secondary | ICD-10-CM | POA: Diagnosis not present

## 2019-11-13 DIAGNOSIS — F4312 Post-traumatic stress disorder, chronic: Secondary | ICD-10-CM | POA: Diagnosis not present

## 2019-11-13 DIAGNOSIS — G43009 Migraine without aura, not intractable, without status migrainosus: Secondary | ICD-10-CM | POA: Diagnosis not present

## 2019-11-13 DIAGNOSIS — D649 Anemia, unspecified: Secondary | ICD-10-CM | POA: Diagnosis not present

## 2019-11-14 NOTE — Progress Notes (Deleted)
Psychiatric Initial Adult Assessment   Patient Identification: Cynthia [name] MRN:  [id number] Date of Evaluation:  11/14/2019 Referral Source: *** Chief Complaint:   Visit Diagnosis: No diagnosis found.  History of Present Illness:   Cynthia [name] is a 69 y.o. year old female with a history of PTSD, depression, substance use disorder by history, COPD, GERD, migraine, who is referred for     Associated Signs/Symptoms: Depression Symptoms:  {DEPRESSION SYMPTOMS:20000} (Hypo) Manic Symptoms:  {BHH MANIC SYMPTOMS:22872} Anxiety Symptoms:  {BHH ANXIETY SYMPTOMS:22873} Psychotic Symptoms:  {BHH PSYCHOTIC SYMPTOMS:22874} PTSD Symptoms: {BHH PTSD SYMPTOMS:22875}  Past Psychiatric History:  Outpatient:  Psychiatry admission:  Previous suicide attempt:  Past trials of medication:  History of violence:   Previous Psychotropic Medications: {YES/NO:21197}  Substance Abuse History in the last 12 months:  {yes no:314532}  Consequences of Substance Abuse: {BHH CONSEQUENCES OF SUBSTANCE ABUSE:22880}  Past Medical History:  Past Medical History:  Diagnosis Date  . Allergy   . Anemia   . Angina pectoris ([place])   . Anxiety   . Arthritis   . Bronchitis   . Bronchitis   . COPD (chronic obstructive pulmonary disease) ([place])   . Depression   . GERD (gastroesophageal reflux disease)   . HCV (hepatitis C virus)    2018 COMPLETE VIROLOGIC RESPONSE  . High blood cholesterol level   . Hypertension   . Migraine   . Migraines   . Neuromuscular disorder ([place])   . Pneumonia   . PTSD (post-traumatic stress disorder)   . Substance abuse in remission ([place]) 05/02/2017    Past Surgical History:  Procedure Laterality Date  . BIOPSY  02/09/2015   Procedure: GASTRIC BIOPSIES ;  Surgeon: Danie Binder, MD;  Location: [facility];  Service: Endoscopy;;  . COLONOSCOPY WITH PROPOFOL N/A 02/09/2015   SLF: 1. the examined terminal ileum appeared to be normal 2. the colonic mucosa appeared normal 3.  small internal hemorrhoids  . ESOPHAGOGASTRODUODENOSCOPY (EGD) WITH PROPOFOL N/A 02/09/2015   SLF: 1. stricture at the gastroesophageal junction 2. mild non-erosive gastritis   . GIVENS CAPSULE STUDY N/A 10/19/2016   Procedure: GIVENS CAPSULE STUDY;  Surgeon: Danie Binder, MD;  Location[facility];  Service: Endoscopy;  Laterality: N/A;  7:30am, pt to arrive at 7:00am  . left arm     ? nerve repaired, " it was pinched".  . left elbow surgery Left   . SAVORY DILATION N/A 02/09/2015   Procedure: SAVORY DILATION 12.66mm, 24mm, 58mm, 32mm;  Surgeon: Danie Binder, MD;  Location: [facility];  Service: Endoscopy;  Laterality: N/A;  . TONSILLECTOMY    . TUBAL LIGATION      Family Psychiatric History: ***  Family History:  Family History  Problem Relation Age of Onset  . Heart failure Father   . Hypertension Father   . Stroke Father   . Colon cancer Father        greater than age 71  . Cancer Father   . Dementia Father   . Cancer Mother   . Seizures Sister     Social History:   Social History   Socioeconomic History  . Marital status: Married    Spouse name: Cheno  . Number of children: 1  . Years of education: Not on file  . Highest education level: Not on file  Occupational History  . Occupation: disability    Comment: lung disease / COPD  Tobacco Use  . Smoking status: Current Every Day Smoker  Packs/day: 0.25    Years: 42.00    Pack years: 10.50    Types: Cigarettes    Start date: 09/29/1972  . Smokeless tobacco: Never Used  . Tobacco comment: about 2 cigs per day  Substance and Sexual Activity  . Alcohol use: No    Alcohol/week: 0.0 standard drinks  . Drug use: No    Types: "Crack" cocaine    Comment: hx of smoking crack cocaine last 5-6 years ago  . Sexual activity: Never    Birth control/protection: None  Other Topics Concern  . Not on file  Social History Narrative   Married to [employer] for 28 years   Keep her sister - she has seizures/disabled   Social  Determinants of Radio broadcast assistant Strain:   . Difficulty of Paying Living Expenses:   Food Insecurity:   . Worried About Charity fundraiser in the Last Year:   . Arboriculturist in the Last Year:   Transportation Needs:   . Film/video editor (Medical):   Marland Kitchen Lack of Transportation (Non-Medical):   Physical Activity:   . Days of Exercise per Week:   . Minutes of Exercise per Session:   Stress:   . Feeling of Stress :   Social Connections:   . Frequency of Communication with Friends and Family:   . Frequency of Social Gatherings with Friends and Family:   . Attends Religious Services:   . Active Member of Clubs or Organizations:   . Attends Archivist Meetings:   Marland Kitchen Marital Status:     Additional Social History: ***  Allergies:   Allergies  Allergen Reactions  . Asa [Aspirin] Other (See Comments)    REACTION: Stomach upset  . Ibuprofen     Metabolic Disorder Labs: No results found for: HGBA1C, MPG No results found for: PROLACTIN Lab Results  Component Value Date   CHOL 211 (H) 05/02/2017   TRIG 176 (H) 05/02/2017   HDL 44 (L) 05/02/2017   CHOLHDL 4.8 05/02/2017   VLDL 18 12/05/2016   LDLCALC 136 (H) 05/02/2017   LDLCALC 163 (H) 12/05/2016   Lab Results  Component Value Date   TSH 0.891 11/19/2008    Therapeutic Level Labs: No results found for: LITHIUM No results found for: CBMZ No results found for: VALPROATE  Current Medications: Current Outpatient Medications  Medication Sig Dispense Refill  . albuterol (PROVENTIL) (2.5 MG/3ML) 0.083% nebulizer solution INHALE 1 VIAL VIA NEBULIZER EVERY 6 HOURS AS NEEDED FOR WHEEZING AND SHORTNESS OF BREATH 150 mL 1  . albuterol (VENTOLIN HFA) 108 (90 Base) MCG/ACT inhaler Inhale 1 puff into the lungs every 6 (six) hours as needed.    . Ascorbic Acid (VITAMIN C PO) Take 1 tablet by mouth daily.    Marland Kitchen atorvastatin (LIPITOR) 40 MG tablet Take 1 tablet by mouth daily.    . budesonide-formoterol  (SYMBICORT) 160-4.5 MCG/ACT inhaler Inhale 2 puffs into the lungs 2 (two) times daily. 1 Inhaler 11  . butalbital-acetaminophen-caffeine (FIORICET) 50-325-40 MG tablet Take 1 tablet by mouth 2 (two) times daily as needed for headache.    Marland Kitchen CALCIUM-VITAMIN D PO Take 1 tablet by mouth daily.    . Ferrous Sulfate (IRON) 325 (65 Fe) MG TABS Take 1 tablet by mouth daily.    Marland Kitchen FLUoxetine (PROZAC) 40 MG capsule Take 40 mg by mouth daily.     . furosemide (LASIX) 20 MG tablet TAKE ONE TABLET BY mouth daily 90 tablet 0  .  lisinopril (ZESTRIL) 40 MG tablet Take 1 tablet by mouth daily.    Marland Kitchen LORazepam (ATIVAN) 0.5 MG tablet Take 0.5 mg by mouth every 12 (twelve) hours as needed for anxiety.     . meloxicam (MOBIC) 15 MG tablet Take 1 tablet (15 mg total) by mouth daily. (Patient taking differently: Take 15 mg by mouth daily as needed. ) 90 tablet 3  . methocarbamol (ROBAXIN) 500 MG tablet Take 2 tablets (1,000 mg total) by mouth 4 (four) times daily as needed for muscle spasms (muscle spasm/pain). (Patient taking differently: Take 500 mg by mouth 2 (two) times daily as needed for muscle spasms (muscle spasm/pain). ) 25 tablet 0  . mirtazapine (REMERON) 15 MG tablet Take 15 mg by mouth at bedtime.    . Multiple Vitamin (MULTIVITAMIN) tablet Take 1 tablet by mouth daily.    Marland Kitchen omeprazole (PRILOSEC) 20 MG capsule TAKE 1 CAPSULE BY MOUTH DAILY BEFORE A MEAL. 90 capsule 3  . propranolol (INDERAL) 80 MG tablet Take 1 tablet (80 mg total) by mouth 2 (two) times daily. 180 tablet 1  . tiZANidine (ZANAFLEX) 2 MG tablet Take 1-2 tablets by mouth every 8 (eight) hours as needed.    . traZODone (DESYREL) 50 MG tablet Take 75 mg by mouth at bedtime as needed for sleep.      No current facility-administered medications for this visit.    Musculoskeletal: Strength & Muscle Tone: N/A Gait & Station: N/A Patient leans: N/A  Psychiatric Specialty Exam: Review of Systems  There were no vitals taken for this visit.There  is no height or weight on file to calculate BMI.  General Appearance: {Appearance:22683}  Eye Contact:  {BHH EYE CONTACT:22684}  Speech:  Clear and Coherent  Volume:  Normal  Mood:  {BHH MOOD:22306}  Affect:  {Affect (PAA):22687}  Thought Process:  Coherent  Orientation:  Full (Time, Place, and Person)  Thought Content:  Logical  Suicidal Thoughts:  {ST/HT (PAA):22692}  Homicidal Thoughts:  {ST/HT (PAA):22692}  Memory:  Immediate;   Good  Judgement:  {Judgement (PAA):22694}  Insight:  {Insight (PAA):22695}  Psychomotor Activity:  Normal  Concentration:  Concentration: Good and Attention Span: Good  Recall:  Good  Fund of Knowledge:Good  Language: Good  Akathisia:  No  Handed:  Right  AIMS (if indicated):  not done  Assets:  Communication Skills Desire for Improvement  ADL's:  Intact  Cognition: WNL  Sleep:  {BHH GOOD/FAIR/POOR:22877}   Screenings: PHQ2-9     Office Visit from 10/30/2017 in [facility] Office Visit from 10/22/2017 in [facility] Office Visit from 10/19/2017 in [facility] Office Visit from 08/13/2017 in [facility] Office Visit from 05/17/2017 in [facility]  PHQ-2 Total Score  4  2  4   0  0  PHQ-9 Total Score  7  12  10   --  --      Assessment and Plan:   Assessment  Plan  The patient demonstrates the following risk factors for suicide: Chronic risk factors for suicide include: {Chronic Risk Factors for GQQPYPP:50932671}. Acute risk factors for suicide include: {Acute Risk Factors for IWPYKDX:83382505}. Protective factors for this patient include: {Protective Factors for Suicide LZJQ:73419379}. Considering these factors, the overall suicide risk at this point appears to be {Desc; low/moderate/high:110033}. Patient {ACTION; IS/IS KWI:09735329} appropriate for outpatient follow up.   Norman Clay, MD 5/7/202110:16 PM

## 2019-11-15 ENCOUNTER — Telehealth (HOSPITAL_COMMUNITY): Payer: Medicare HMO | Admitting: Psychiatry

## 2019-11-15 ENCOUNTER — Encounter (HOSPITAL_COMMUNITY): Payer: Self-pay | Admitting: Psychiatry

## 2019-11-15 ENCOUNTER — Telehealth (HOSPITAL_COMMUNITY): Payer: Self-pay | Admitting: Psychiatry

## 2019-11-15 ENCOUNTER — Other Ambulatory Visit: Payer: Self-pay

## 2019-11-15 NOTE — Telephone Encounter (Signed)
Sent link for video visit through [facility]. Patient did not sign in. Called the patient for appointment scheduled today. The patient states that she does not know how to do video visit. She states that her husband may be able to help her, although he is not with the patient at this moment. She agrees to reschedule the appointment. Front desk to contact the patient for reschedule.

## 2019-11-17 ENCOUNTER — Encounter (HOSPITAL_COMMUNITY): Payer: Self-pay | Admitting: Psychiatry

## 2019-11-19 ENCOUNTER — Encounter: Payer: Self-pay | Admitting: Gastroenterology

## 2019-11-24 DIAGNOSIS — R7401 Elevation of levels of liver transaminase levels: Secondary | ICD-10-CM | POA: Diagnosis not present

## 2019-11-24 DIAGNOSIS — F331 Major depressive disorder, recurrent, moderate: Secondary | ICD-10-CM | POA: Diagnosis not present

## 2019-11-24 DIAGNOSIS — J449 Chronic obstructive pulmonary disease, unspecified: Secondary | ICD-10-CM | POA: Diagnosis not present

## 2019-11-24 DIAGNOSIS — K219 Gastro-esophageal reflux disease without esophagitis: Secondary | ICD-10-CM | POA: Diagnosis not present

## 2019-11-24 DIAGNOSIS — I10 Essential (primary) hypertension: Secondary | ICD-10-CM | POA: Diagnosis not present

## 2019-11-24 DIAGNOSIS — K5900 Constipation, unspecified: Secondary | ICD-10-CM | POA: Diagnosis not present

## 2019-11-24 DIAGNOSIS — E7849 Other hyperlipidemia: Secondary | ICD-10-CM | POA: Diagnosis not present

## 2019-11-24 DIAGNOSIS — R634 Abnormal weight loss: Secondary | ICD-10-CM | POA: Diagnosis not present

## 2019-11-24 DIAGNOSIS — R1010 Upper abdominal pain, unspecified: Secondary | ICD-10-CM | POA: Diagnosis not present

## 2019-11-25 NOTE — Progress Notes (Deleted)
Psychiatric Initial Adult Assessment   Patient Identification: Cynthia [name] MRN:  [id number] Date of Evaluation:  11/25/2019 Referral Source: *** Chief Complaint:   Visit Diagnosis: No diagnosis found.  History of Present Illness:   Cynthia [name] is a 69 y.o. year old female with a history of PTSD, depression,  substance use disorder by history, COPD, GERD, migraine , who is referred for    Associated Signs/Symptoms: Depression Symptoms:  {DEPRESSION SYMPTOMS:20000} (Hypo) Manic Symptoms:  {BHH MANIC SYMPTOMS:22872} Anxiety Symptoms:  {BHH ANXIETY SYMPTOMS:22873} Psychotic Symptoms:  {BHH PSYCHOTIC SYMPTOMS:22874} PTSD Symptoms: {BHH PTSD SYMPTOMS:22875}  Past Psychiatric History:  Outpatient:  Psychiatry admission:  Previous suicide attempt:  Past trials of medication:  History of violence:   Previous Psychotropic Medications: {YES/NO:21197}  Substance Abuse History in the last 12 months:  {yes no:314532}  Consequences of Substance Abuse: {BHH CONSEQUENCES OF SUBSTANCE ABUSE:22880}  Past Medical History:  Past Medical History:  Diagnosis Date  . Allergy   . Anemia   . Angina pectoris ([place])   . Anxiety   . Arthritis   . Bronchitis   . Bronchitis   . COPD (chronic obstructive pulmonary disease) ([place])   . Depression   . GERD (gastroesophageal reflux disease)   . HCV (hepatitis C virus)    2018 COMPLETE VIROLOGIC RESPONSE  . High blood cholesterol level   . Hypertension   . Migraine   . Migraines   . Neuromuscular disorder ([place])   . Pneumonia   . PTSD (post-traumatic stress disorder)   . Substance abuse in remission ([place]) 05/02/2017    Past Surgical History:  Procedure Laterality Date  . BIOPSY  02/09/2015   Procedure: GASTRIC BIOPSIES ;  Surgeon: Danie Binder, MD;  Location: [facility];  Service: Endoscopy;;  . COLONOSCOPY WITH PROPOFOL N/A 02/09/2015   SLF: 1. the examined terminal ileum appeared to be normal 2. the colonic mucosa appeared normal 3.  small internal hemorrhoids  . ESOPHAGOGASTRODUODENOSCOPY (EGD) WITH PROPOFOL N/A 02/09/2015   SLF: 1. stricture at the gastroesophageal junction 2. mild non-erosive gastritis   . GIVENS CAPSULE STUDY N/A 10/19/2016   Procedure: GIVENS CAPSULE STUDY;  Surgeon: Danie Binder, MD;  [facility];  Service: Endoscopy;  Laterality: N/A;  7:30am, pt to arrive at 7:00am  . left arm     ? nerve repaired, " it was pinched".  . left elbow surgery Left   . SAVORY DILATION N/A 02/09/2015   Procedure: SAVORY DILATION 12.77mm, 1mm, 76mm, 30mm;  Surgeon: Danie Binder, MD;  Location: [facility];  Service: Endoscopy;  Laterality: N/A;  . TONSILLECTOMY    . TUBAL LIGATION      Family Psychiatric History: ***  Family History:  Family History  Problem Relation Age of Onset  . Heart failure Father   . Hypertension Father   . Stroke Father   . Colon cancer Father        greater than age 43  . Cancer Father   . Dementia Father   . Cancer Mother   . Seizures Sister     Social History:   Social History   Socioeconomic History  . Marital status: Married    Spouse name: Cheno  . Number of children: 1  . Years of education: Not on file  . Highest education level: Not on file  Occupational History  . Occupation: disability    Comment: lung disease / COPD  Tobacco Use  . Smoking status: Current Every Day Smoker  Packs/day: 0.25    Years: 42.00    Pack years: 10.50    Types: Cigarettes    Start date: 09/29/1972  . Smokeless tobacco: Never Used  . Tobacco comment: about 2 cigs per day  Substance and Sexual Activity  . Alcohol use: No    Alcohol/week: 0.0 standard drinks  . Drug use: No    Types: "Crack" cocaine    Comment: hx of smoking crack cocaine last 5-6 years ago  . Sexual activity: Never    Birth control/protection: None  Other Topics Concern  . Not on file  Social History Narrative   Married to [employer] for 28 years   Keep her sister - she has seizures/disabled   Social  Determinants of Radio broadcast assistant Strain:   . Difficulty of Paying Living Expenses:   Food Insecurity:   . Worried About Charity fundraiser in the Last Year:   . Arboriculturist in the Last Year:   Transportation Needs:   . Film/video editor (Medical):   Marland Kitchen Lack of Transportation (Non-Medical):   Physical Activity:   . Days of Exercise per Week:   . Minutes of Exercise per Session:   Stress:   . Feeling of Stress :   Social Connections:   . Frequency of Communication with Friends and Family:   . Frequency of Social Gatherings with Friends and Family:   . Attends Religious Services:   . Active Member of Clubs or Organizations:   . Attends Archivist Meetings:   Marland Kitchen Marital Status:     Additional Social History: ***  Allergies:   Allergies  Allergen Reactions  . Asa [Aspirin] Other (See Comments)    REACTION: Stomach upset  . Ibuprofen     Metabolic Disorder Labs: No results found for: HGBA1C, MPG No results found for: PROLACTIN Lab Results  Component Value Date   CHOL 211 (H) 05/02/2017   TRIG 176 (H) 05/02/2017   HDL 44 (L) 05/02/2017   CHOLHDL 4.8 05/02/2017   VLDL 18 12/05/2016   LDLCALC 136 (H) 05/02/2017   LDLCALC 163 (H) 12/05/2016   Lab Results  Component Value Date   TSH 0.891 11/19/2008    Therapeutic Level Labs: No results found for: LITHIUM No results found for: CBMZ No results found for: VALPROATE  Current Medications: Current Outpatient Medications  Medication Sig Dispense Refill  . albuterol (PROVENTIL) (2.5 MG/3ML) 0.083% nebulizer solution INHALE 1 VIAL VIA NEBULIZER EVERY 6 HOURS AS NEEDED FOR WHEEZING AND SHORTNESS OF BREATH 150 mL 1  . albuterol (VENTOLIN HFA) 108 (90 Base) MCG/ACT inhaler Inhale 1 puff into the lungs every 6 (six) hours as needed.    . Ascorbic Acid (VITAMIN C PO) Take 1 tablet by mouth daily.    Marland Kitchen atorvastatin (LIPITOR) 40 MG tablet Take 1 tablet by mouth daily.    . budesonide-formoterol  (SYMBICORT) 160-4.5 MCG/ACT inhaler Inhale 2 puffs into the lungs 2 (two) times daily. 1 Inhaler 11  . butalbital-acetaminophen-caffeine (FIORICET) 50-325-40 MG tablet Take 1 tablet by mouth 2 (two) times daily as needed for headache.    Marland Kitchen CALCIUM-VITAMIN D PO Take 1 tablet by mouth daily.    . Ferrous Sulfate (IRON) 325 (65 Fe) MG TABS Take 1 tablet by mouth daily.    Marland Kitchen FLUoxetine (PROZAC) 40 MG capsule Take 40 mg by mouth daily.     . furosemide (LASIX) 20 MG tablet TAKE ONE TABLET BY mouth daily 90 tablet 0  .  lisinopril (ZESTRIL) 40 MG tablet Take 1 tablet by mouth daily.    Marland Kitchen LORazepam (ATIVAN) 0.5 MG tablet Take 0.5 mg by mouth every 12 (twelve) hours as needed for anxiety.     . meloxicam (MOBIC) 15 MG tablet Take 1 tablet (15 mg total) by mouth daily. (Patient taking differently: Take 15 mg by mouth daily as needed. ) 90 tablet 3  . methocarbamol (ROBAXIN) 500 MG tablet Take 2 tablets (1,000 mg total) by mouth 4 (four) times daily as needed for muscle spasms (muscle spasm/pain). (Patient taking differently: Take 500 mg by mouth 2 (two) times daily as needed for muscle spasms (muscle spasm/pain). ) 25 tablet 0  . mirtazapine (REMERON) 15 MG tablet Take 15 mg by mouth at bedtime.    . Multiple Vitamin (MULTIVITAMIN) tablet Take 1 tablet by mouth daily.    Marland Kitchen omeprazole (PRILOSEC) 20 MG capsule TAKE 1 CAPSULE BY MOUTH DAILY BEFORE A MEAL. 90 capsule 3  . propranolol (INDERAL) 80 MG tablet Take 1 tablet (80 mg total) by mouth 2 (two) times daily. 180 tablet 1  . tiZANidine (ZANAFLEX) 2 MG tablet Take 1-2 tablets by mouth every 8 (eight) hours as needed.    . traZODone (DESYREL) 50 MG tablet Take 75 mg by mouth at bedtime as needed for sleep.      No current facility-administered medications for this visit.    Musculoskeletal: Strength & Muscle Tone: N/A Gait & Station: N/A Patient leans: N/A  Psychiatric Specialty Exam: Review of Systems  There were no vitals taken for this visit.There  is no height or weight on file to calculate BMI.  General Appearance: {Appearance:22683}  Eye Contact:  {BHH EYE CONTACT:22684}  Speech:  Clear and Coherent  Volume:  Normal  Mood:  {BHH MOOD:22306}  Affect:  {Affect (PAA):22687}  Thought Process:  Coherent  Orientation:  Full (Time, Place, and Person)  Thought Content:  Logical  Suicidal Thoughts:  {ST/HT (PAA):22692}  Homicidal Thoughts:  {ST/HT (PAA):22692}  Memory:  Immediate;   Good  Judgement:  {Judgement (PAA):22694}  Insight:  {Insight (PAA):22695}  Psychomotor Activity:  Normal  Concentration:  Concentration: Good and Attention Span: Good  Recall:  Good  Fund of Knowledge:Good  Language: Good  Akathisia:  No  Handed:  Right  AIMS (if indicated):  not done  Assets:  Communication Skills Desire for Improvement  ADL's:  Intact  Cognition: WNL  Sleep:  {BHH GOOD/FAIR/POOR:22877}   Screenings: PHQ2-9     Office Visit from 10/30/2017 in [facility] Office Visit from 10/22/2017 in [facility] Office Visit from 10/19/2017 in [facility] Office Visit from 08/13/2017 in [facility] Office Visit from 05/17/2017 in [facility]  PHQ-2 Total Score  4  2  4   0  0  PHQ-9 Total Score  7  12  10   --  --      Assessment and Plan:  Assessment  Plan  The patient demonstrates the following risk factors for suicide: Chronic risk factors for suicide include: {Chronic Risk Factors for QBHALPF:79024097}. Acute risk factors for suicide include: {Acute Risk Factors for DZHGDJM:42683419}. Protective factors for this patient include: {Protective Factors for Suicide QQIW:97989211}. Considering these factors, the overall suicide risk at this point appears to be {Desc; low/moderate/high:110033}. Patient {ACTION; IS/IS HER:74081448} appropriate for outpatient follow up.    Norman Clay, MD 5/18/20214:15 PM

## 2019-12-01 DIAGNOSIS — L239 Allergic contact dermatitis, unspecified cause: Secondary | ICD-10-CM | POA: Diagnosis not present

## 2019-12-02 ENCOUNTER — Telehealth (HOSPITAL_COMMUNITY): Payer: Self-pay | Admitting: Psychiatry

## 2019-12-02 ENCOUNTER — Other Ambulatory Visit: Payer: Self-pay

## 2019-12-02 ENCOUNTER — Telehealth (HOSPITAL_COMMUNITY): Payer: Medicare HMO | Admitting: Psychiatry

## 2019-12-02 ENCOUNTER — Ambulatory Visit (HOSPITAL_COMMUNITY): Payer: Medicare HMO | Admitting: Psychiatry

## 2019-12-02 NOTE — Telephone Encounter (Signed)
Contacted her for the appointment this morning. She states that she forgot this appointment, and would not have time for it. She understands that this would be counted as no show, and would like it to be rescheduled. Discussed attendance policy.

## 2019-12-09 ENCOUNTER — Ambulatory Visit: Payer: Medicare Other | Admitting: Cardiology

## 2019-12-11 DIAGNOSIS — K219 Gastro-esophageal reflux disease without esophagitis: Secondary | ICD-10-CM | POA: Diagnosis not present

## 2019-12-11 DIAGNOSIS — M79671 Pain in right foot: Secondary | ICD-10-CM | POA: Diagnosis not present

## 2019-12-11 DIAGNOSIS — E43 Unspecified severe protein-calorie malnutrition: Secondary | ICD-10-CM | POA: Diagnosis not present

## 2019-12-11 DIAGNOSIS — R634 Abnormal weight loss: Secondary | ICD-10-CM | POA: Diagnosis not present

## 2019-12-11 DIAGNOSIS — K5900 Constipation, unspecified: Secondary | ICD-10-CM | POA: Diagnosis not present

## 2019-12-11 DIAGNOSIS — R1010 Upper abdominal pain, unspecified: Secondary | ICD-10-CM | POA: Diagnosis not present

## 2019-12-11 DIAGNOSIS — R7401 Elevation of levels of liver transaminase levels: Secondary | ICD-10-CM | POA: Diagnosis not present

## 2019-12-11 DIAGNOSIS — J449 Chronic obstructive pulmonary disease, unspecified: Secondary | ICD-10-CM | POA: Diagnosis not present

## 2019-12-11 DIAGNOSIS — F331 Major depressive disorder, recurrent, moderate: Secondary | ICD-10-CM | POA: Diagnosis not present

## 2019-12-15 DIAGNOSIS — G4489 Other headache syndrome: Secondary | ICD-10-CM | POA: Diagnosis not present

## 2019-12-15 DIAGNOSIS — M25519 Pain in unspecified shoulder: Secondary | ICD-10-CM | POA: Diagnosis not present

## 2019-12-15 DIAGNOSIS — G4700 Insomnia, unspecified: Secondary | ICD-10-CM | POA: Diagnosis not present

## 2019-12-15 DIAGNOSIS — M542 Cervicalgia: Secondary | ICD-10-CM | POA: Diagnosis not present

## 2019-12-15 DIAGNOSIS — F321 Major depressive disorder, single episode, moderate: Secondary | ICD-10-CM | POA: Diagnosis not present

## 2019-12-15 DIAGNOSIS — I10 Essential (primary) hypertension: Secondary | ICD-10-CM | POA: Diagnosis not present

## 2019-12-17 ENCOUNTER — Telehealth (HOSPITAL_COMMUNITY): Payer: Medicare HMO | Admitting: Psychiatry

## 2019-12-18 ENCOUNTER — Ambulatory Visit: Payer: Medicare HMO | Admitting: Gastroenterology

## 2020-01-13 DIAGNOSIS — E43 Unspecified severe protein-calorie malnutrition: Secondary | ICD-10-CM | POA: Diagnosis not present

## 2020-01-13 DIAGNOSIS — M79671 Pain in right foot: Secondary | ICD-10-CM | POA: Diagnosis not present

## 2020-01-13 DIAGNOSIS — M79604 Pain in right leg: Secondary | ICD-10-CM | POA: Diagnosis not present

## 2020-01-28 DIAGNOSIS — I10 Essential (primary) hypertension: Secondary | ICD-10-CM | POA: Diagnosis not present

## 2020-01-28 DIAGNOSIS — Z720 Tobacco use: Secondary | ICD-10-CM | POA: Diagnosis not present

## 2020-01-28 DIAGNOSIS — J449 Chronic obstructive pulmonary disease, unspecified: Secondary | ICD-10-CM | POA: Diagnosis not present

## 2020-01-28 DIAGNOSIS — E7849 Other hyperlipidemia: Secondary | ICD-10-CM | POA: Diagnosis not present

## 2020-01-28 NOTE — Progress Notes (Signed)
Referring Provider: Celene Squibb, MD Primary Care Physician:  Celene Squibb, MD Primary Gastroenterologist:  Dr. Oneida Alar; will establish with Dr. Abbey Chatters; Dr. Gala Romney following for now  Chief Complaint  Patient presents with  . Hepatitis C    has had treatment in past  . Weight Loss    20lbs in 3 months  . decrease appetite    HPI:   Cynthia [name] is a 69 y.o. female presenting today at the request of Celene Squibb, MD for weight loss, decreased appetite, and ?hep C.   History of GERD, Hep C s/p completed Harvoni 12/2015 with SVR. H/O F2/F3 on u/s with elastography prior to Hep C treatment. Repeat US with elastography in Feb 2018 with F0/F1 without need for ongoing hepatoma surveillance. History of H. pylori in August 2016 but eradication conformed in 02/2016 with negative stool test. Chronic history of anemia. Colonoscopy August 2016 for weight loss, rectal bleeding, and constipation with normal exam other than internal hemorrhoids. Recommended repeat in 10 years. 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 Pylori), and normal examined duodenum. 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 Recommended continuing iron, CBC q3 months. She was ont having melena at that time, stated consider referral for balloon enteroscopy or angiography/embolization if active bleeding.   CT A/P with contrast 09/25/2019: No acute findings in the abdomen or pelvis. Specifically no findings to explain patient's history of abdominal pain and weight loss. Mild lymphadenopathy in the hepatoduodenal ligament slightly increased in interval but likely reactive. Recommend follow-up CT in 3 months to ensure stability. Trace free fluid in the pelvis, etiology indeterminate.  Reviewed recent labs completed with PCP 11/13/2019.  Hemoglobin 10.9 (L), MCHC 31.3 (L).  Platelets 211.   Electrolytes within normal limits.  Kidney function within normal limits.  Alk phos 675 (H), AST 218 (H), ALT 108 (H).  Total bilirubin within normal limits.  Ferritin 1097 (H), iron saturation 60% (H), iron 138.  Reported right-sided abdominal pain that have been present for couple of months.  Query if taking too much medicine and Topamax was curbing her appetite.  Today: Decreased appetite. States she has been under a lot of stress for the last 1.5 years, increased over the last 4 months. Takes care of her sister. Trouble with her husband. States he loses his temper and they argue. Typically daily. States she will go to her room to sleep or cry. Overwhelmed.  Has been going to [facility] for a few years.  She is on Ativan and Prozac.  No recent adjustments in anxiety/depression meds. Feels her anxiety and depression are influencing her appetite.   States she is eating but not eating like she was. Eating about 1-2 times a day. She is drinking Ensure twice a day. Breakfast she may eat bacon and cheese toast. Doesn't eat lunch. Will cook 2-3 times a week for dinner. She will cook a meat and some sides. When she doesn't cook, she will drink an ensure. States she has to do everything at home.   Feels she gets full quickly. No nausea or vomiting. She did vomit 2 days ago and isn't sure why. No GERD symptoms on omeprazole.  No dysphagia.  No abdominal pain. BMs once a day to every other day. No diarrhea or constipation. No blood in the stool. No black stool.   Thinks  she has lost 15 lbs in the last 3-4 months.   Denies alcohol. No illicit drug use. No exposures to hepatitis C that she knows of. No new tattoos. No tylenol. Takes Fioricet about 3 times a week for headaches. Takes Meloxicam for right leg pain daily for several months.  Denies swelling in her lower extremities or abdomen, yellowing of her eyes, easy bruising or bleeding, or confusion.  Taking Marinol every other day. Tried it daily but states  she did not notice a difference.  Weights: 04/06/2014: 138 lbs  2016: 1-teens.  12/08/2015: 145 lbs 12/19/2016: 145 lbs 10/30/2017: 133 lbs 10/22/2018: 135 lbs 05/23/2019: 123 lbs 11/13/19: 113.6 lbs 01/29/20: 117 lbs  Past Medical History:  Diagnosis Date  . Allergy   . Anemia   . Angina pectoris ([place])   . Anxiety   . Arthritis   . Bronchitis   . Bronchitis   . COPD (chronic obstructive pulmonary disease) ([place])   . Depression   . GERD (gastroesophageal reflux disease)   . HCV (hepatitis C virus)    2018 COMPLETE VIROLOGIC RESPONSE  . High blood cholesterol level   . Hypertension   . Migraine   . Migraines   . Neuromuscular disorder ([place])   . Pneumonia   . PTSD (post-traumatic stress disorder)   . 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 BIOPSY  02/09/2015   Procedure: GASTRIC BIOPSIES ;  Surgeon: Danie Binder, MD;  Location: [facility] ORS;  Service: Endoscopy;;  . COLONOSCOPY WITH PROPOFOL N/A 02/09/2015   SLF: 1. the examined terminal ileum appeared to be normal 2. the colonic mucosa appeared normal 3. small internal hemorrhoids  . ESOPHAGOGASTRODUODENOSCOPY (EGD) WITH PROPOFOL N/A 02/09/2015   SLF: 1. stricture at the gastroesophageal junction 2. mild non-erosive gastritis   . GIVENS CAPSULE STUDY N/A 10/19/2016   Procedure: GIVENS CAPSULE STUDY;  Surgeon: Danie Binder, MD;  Location: [facility] ENDO SUITE;  Service: Endoscopy;  Laterality: N/A;  7:30am, pt to arrive at 7:00am  . left arm     ? nerve repaired, " it was pinched".  . left elbow surgery Left   . SAVORY DILATION N/A 02/09/2015   Procedure: SAVORY DILATION 12.21m, 143m [id number]: SandDanie Binder;  Location: [facility]S;  Service: Endoscopy;  Laterality: N/A;  . TONSILLECTOMY    . TUBAL LIGATION      Current Outpatient Medications  Medication Sig Dispense Refill  . albuterol (VENTOLIN HFA) 108 (90 Base) MCG/ACT inhaler Inhale 1 puff into the lungs every  6 (six) hours as needed.    . amMarland KitchenODipine (NORVASC) 10 MG tablet Take 10 mg by mouth daily.    . Ascorbic Acid (VITAMIN C PO) Take 1 tablet by mouth daily.    . atMarland Kitchenrvastatin (LIPITOR) 40 MG tablet Take 1 tablet by mouth daily.    . butalbital-acetaminophen-caffeine (FIORICET) 50-325-40 MG tablet Take 1 tablet by mouth 2 (two) times daily as needed for headache.    . Calcium-Magnesium-Zinc (CAL-MAG-ZINC PO) Take by mouth daily.    . drMarland Kitchennabinol (MARINOL) 2.5 MG capsule Take 2.5 mg by mouth 2 (two) times daily before a meal.    . FLUoxetine (PROZAC) 40 MG capsule Take 40 mg by mouth daily.     . furosemide (LASIX) 20 MG tablet TAKE ONE TABLET BY mouth daily 90 tablet 0  . LORazepam (ATIVAN) 0.5 MG tablet Take 0.5 mg by mouth at bedtime. As  needed    . meloxicam (MOBIC) 15 MG tablet Take 1 tablet (15 mg total) by mouth daily. (Patient taking differently: Take 15 mg by mouth daily as needed. ) 90 tablet 3  . mirtazapine (REMERON) 15 MG tablet Take 15 mg by mouth at bedtime.    . Multiple Vitamin (MULTIVITAMIN) tablet Take 1 tablet by mouth daily.    . nitroGLYCERIN (NITROSTAT) 0.4 MG SL tablet Place 0.4 mg under the tongue every 5 (five) minutes as needed for chest pain.    . omeprazole (PRILOSEC) 20 MG capsule TAKE 1 CAPSULE BY MOUTH DAILY BEFORE A MEAL. 90 capsule 3  . propranolol (INDERAL) 80 MG tablet Take 1 tablet (80 mg total) by mouth 2 (two) times daily. 180 tablet 1  . tiZANidine (ZANAFLEX) 2 MG tablet Take 1-2 tablets by mouth every 8 (eight) hours as needed.    . topiramate (TOPAMAX) 25 MG tablet Take 25 mg by mouth at bedtime.    . traMADol (ULTRAM) 50 MG tablet Take 50-100 mg by mouth every 6 (six) hours as needed.    . albuterol (PROVENTIL) (2.5 MG/3ML) 0.083% nebulizer solution INHALE 1 VIAL VIA NEBULIZER EVERY 6 HOURS AS NEEDED FOR WHEEZING AND SHORTNESS OF BREATH (Patient not taking: Reported on 01/29/2020) 150 mL 1  . budesonide-formoterol (SYMBICORT) 160-4.5 MCG/ACT inhaler Inhale 2  puffs into the lungs 2 (two) times daily. (Patient not taking: Reported on 01/29/2020) 1 Inhaler 11  . CALCIUM-VITAMIN D PO Take 1 tablet by mouth daily. (Patient not taking: Reported on 01/29/2020)    . Ferrous Sulfate (IRON) 325 (65 Fe) MG TABS Take 1 tablet by mouth daily. (Patient not taking: Reported on 01/29/2020)    . lisinopril (ZESTRIL) 40 MG tablet Take 1 tablet by mouth daily. (Patient not taking: Reported on 01/29/2020)    . methocarbamol (ROBAXIN) 500 MG tablet Take 2 tablets (1,000 mg total) by mouth 4 (four) times daily as needed for muscle spasms (muscle spasm/pain). (Patient not taking: Reported on 01/29/2020) 25 tablet 0  . traZODone (DESYREL) 50 MG tablet Take 75 mg by mouth at bedtime as needed for sleep.  (Patient not taking: Reported on 01/29/2020)     No current facility-administered medications for this visit.    Allergies as of 01/29/2020 - Review Complete 01/29/2020  Allergen Reaction Noted  . Asa [aspirin] Other (See Comments) 11/11/2008  . Ibuprofen  05/23/2019    Family History  Problem Relation Age of Onset  . Heart failure Father   . Hypertension Father   . Stroke Father   . Colon cancer Father        greater than age 60  . Cancer Father   . Dementia Father   . Cancer Mother   . Seizures Sister     Social History   Socioeconomic History  . Marital status: Married    Spouse name: Cheno  . Number of children: 1  . Years of education: Not on file  . Highest education level: Not on file  Occupational History  . Occupation: disability    Comment: lung disease / COPD  Tobacco Use  . Smoking status: Current Every Day Smoker    Packs/day: 0.25    Years: 42.00    Pack years: 10.50    Types: Cigarettes    Start date: 09/29/1972  . Smokeless tobacco: Never Used  . Tobacco comment: about 2 cigs per day  Vaping Use  . Vaping Use: Never used  Substance and Sexual Activity  . Alcohol   use: No    Alcohol/week: 0.0 standard drinks  . Drug use: No    Types:  "Crack" cocaine    Comment: hx of smoking crack cocaine last 5-6 years ago  . Sexual activity: Never    Birth control/protection: None  Other Topics Concern  . Not on file  Social History Narrative   Married to [employer] for 28 years   Keep her sister - she has seizures/disabled   Social Determinants of Radio broadcast assistant Strain:   . Difficulty of Paying Living Expenses:   Food Insecurity:   . Worried About Charity fundraiser in the Last Year:   . Arboriculturist in the Last Year:   Transportation Needs:   . Film/video editor (Medical):   Marland Kitchen Lack of Transportation (Non-Medical):   Physical Activity:   . Days of Exercise per Week:   . Minutes of Exercise per Session:   Stress:   . Feeling of Stress :   Social Connections:   . Frequency of Communication with Friends and Family:   . Frequency of Social Gatherings with Friends and Family:   . Attends Religious Services:   . Active Member of Clubs or Organizations:   . Attends Archivist Meetings:   Marland Kitchen Marital Status:   Intimate Partner Violence:   . Fear of Current or Ex-Partner:   . Emotionally Abused:   Marland Kitchen Physically Abused:   . Sexually Abused:     Review of Systems: Gen: Denies any fever, chills, cold or flulike symptoms, presyncope, syncope. CV: Denies chest pain or heart palpitations. Resp: Admits to SOB with exertion.  No shortness of breath at rest.  Occasional cough. GI: See HPI Psych: See HPI Heme: See HPI  Physical Exam: BP 126/71   Pulse 76   Temp 97.8 F (36.6 C)   Ht 5' 4" (1.626 m)   Wt 117 lb 9.6 oz (53.3 kg)   BMI 20.19 kg/m  General:   Alert and oriented. Pleasant and cooperative. Well-nourished and well-developed.  Head:  Normocephalic and atraumatic. Eyes:  Without icterus, sclera clear and conjunctiva pink.  Ears:  Normal auditory acuity. Lungs:  Clear to auscultation bilaterally. No wheezes, rales, or rhonchi. No distress.  Heart:  S1, S2 present without murmurs  appreciated.  Abdomen:  +BS, soft, non-tender and non-distended. No HSM noted. No guarding or rebound. No masses appreciated.  Rectal:  Deferred  Msk:  Symmetrical without gross deformities. Normal posture. Extremities:  Without edema. Neurologic:  Alert and  oriented x4;  grossly normal neurologically. Skin:  Intact without significant lesions or rashes. Psych: Normal mood and affect.

## 2020-01-29 ENCOUNTER — Encounter: Payer: Self-pay | Admitting: Gastroenterology

## 2020-01-29 ENCOUNTER — Encounter: Payer: Self-pay | Admitting: *Deleted

## 2020-01-29 ENCOUNTER — Other Ambulatory Visit: Payer: Self-pay

## 2020-01-29 ENCOUNTER — Ambulatory Visit (INDEPENDENT_AMBULATORY_CARE_PROVIDER_SITE_OTHER): Payer: Medicare Other | Admitting: Gastroenterology

## 2020-01-29 ENCOUNTER — Telehealth: Payer: Self-pay | Admitting: *Deleted

## 2020-01-29 VITALS — BP 126/71 | HR 76 | Temp 97.8°F | Ht 64.0 in | Wt 117.6 lb

## 2020-01-29 DIAGNOSIS — M79604 Pain in right leg: Secondary | ICD-10-CM | POA: Diagnosis not present

## 2020-01-29 DIAGNOSIS — R6881 Early satiety: Secondary | ICD-10-CM | POA: Diagnosis not present

## 2020-01-29 DIAGNOSIS — R634 Abnormal weight loss: Secondary | ICD-10-CM

## 2020-01-29 DIAGNOSIS — R7989 Other specified abnormal findings of blood chemistry: Secondary | ICD-10-CM | POA: Diagnosis not present

## 2020-01-29 DIAGNOSIS — R630 Anorexia: Secondary | ICD-10-CM

## 2020-01-29 DIAGNOSIS — L84 Corns and callosities: Secondary | ICD-10-CM | POA: Diagnosis not present

## 2020-01-29 DIAGNOSIS — M79671 Pain in right foot: Secondary | ICD-10-CM | POA: Diagnosis not present

## 2020-01-29 NOTE — Progress Notes (Signed)
Cc'ed to pcp °

## 2020-01-29 NOTE — Assessment & Plan Note (Signed)
Addressed under loss of weight

## 2020-01-29 NOTE — Patient Instructions (Addendum)
Please have labs completed at [facility].  I am also scheduling you for an ultrasound to look at your liver again as your liver function tests are elevated.  We will get you scheduled for an upper endoscopy in the near future with Dr. Abbey Chatters to evaluate your loss of appetite, weight loss, and sensation of getting full quickly.  Please discuss your anxiety and depression with your primary care as well as you came in.  I feel this is likely contributing quite a bit to your loss of appetite and weight loss.  Please start drinking Ensure 3 times daily.  Try to at least eat 4 small meals throughout the day.  Please try decreasing meloxicam use as much as possible.  Avoid all other NSAIDs including ibuprofen, Aleve, Advil, Goody powders, naproxen, and anything that says "NSAID" on the package.  Do not take more than 2000 mg of Tylenol in 1 day.  Avoid all alcohol.  We will plan to see back after your upper endoscopy.  Do not hesitate to call with questions or concerns prior.  Aliene Altes, PA-C [facility]

## 2020-01-29 NOTE — Assessment & Plan Note (Addendum)
69 year old female presenting at the request of her primary care provider for weight loss, decreased appetite, and elevated LFTs.  Patient reports a 97-monthhistory of weight loss, loss of appetite, early satiety.  Per documented weights in the system, appears patient lost about 12 pounds between April 2020 in May 2021 but has gained 4 pounds in the last 2 months according to our scales today. Denies abdominal pain.  No typical nausea or vomiting although she did vomit 2 days ago and she is not sure why.  GERD is well controlled on omeprazole daily.  Bowels are moving well.  Denies BRBPR or melena.  Prior evaluation with CT A/P with contrast 09/25/2019 with no acute findings.  Labs with PCP 11/13/2019 with hemoglobin stable at 10.9, alk phos 675, AST 218, ALT 108, total bilirubin within normal limits.  Ferritin 1097, iron saturation 60%, iron 138.  Last colonoscopy in August 2016 with normal exam up and internal hemorrhoids.  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 pylori s/p treatment with documented eradication.   After further discussion with patient, I feel weight loss and decreased appetite have a lot to do with anxiety/depression due to having to take care of her sister and having trouble with her husband.  However, we will need to rule out upper GI etiology including gastric outlet obstruction secondary to malignancy.  Could also have pyloric stenosis or gastroparesis.  Not sure if elevated LFTs are related to weight loss/early satiety/lack of appetite.  She has history of hep C but was treated with Harvoni and achieved SVR.  Denies alcohol or drug use.  We will plan to recheck for hepatitis as well as hemochromatosis.  Plan: Proceed with EGD with propofol with Dr. CAbbey Chattersin the near future. The risks, benefits, and alternatives have been discussed in detail with patient. They have stated understanding and desire to proceed.  ASA III Update CBC, CMP,  INR Check hepatitis C and hepatitis B serologies. Hemochromatosis DNA labs. Update ultrasound abdomen complete Drinking Ensure 3 times daily. Try to at least eat 4 small meals throughout the day. Decrease meloxicam use as much as possible. Avoid all other NSAIDs. Limit Tylenol.  No more than 2000 mg in 24 hours. Advised patient to follow-up with primary care and [facility] regarding depression/anxiety. Follow-up after EGD.

## 2020-01-29 NOTE — Assessment & Plan Note (Addendum)
69 year old female with history of hepatitis C s/p treatment with Harvoni and achieving SVR in 2017.  She is presenting today at the request of her primary care provider with concerns of elevated LFTs as well as weight loss, early satiety, and lack of appetite.  Not sure if elevated LFTs are related to weight loss which is addressed below.  Labs 11/13/2019 with alk phos 675 (H), AST 218 (H), ALT 108 (H).  Total bilirubin within normal limits.  Ferritin 1097 (H), iron saturation 60% (H), iron 138.  Platelets within normal limits.  Patient denies alcohol, illicit drug use since Hep C treatment, exposure to hepatitis C, new tattoos, or Tylenol.  Typically, no abdominal pain, nausea, or vomiting. She does take Fioricet about 3 times a week.  No signs or symptoms of decompensated liver disease.  Recent CT in March 2021 with no suspicious focal abnormality within the liver parenchyma, no gallstones, no intrahepatic or extrahepatic biliary dilation. Mild lymphadenopathy in the hepatoduodenal ligament slightly increased in interval but likely reactive. Recommend follow-up CT in 3 months to ensure stability.   Etiology of elevated LFTs is not clear.  Cannot rule out recurrent hepatitis C although risk is low.  Concern for hemochromatosis.  Plan: Update CBC, CMP, INR Update ultrasound abdomen complete Hepatitis B and C serologies Hemochromatosis DNA  Advise she avoid alcohol Limit Tylenol.  No more than 2000 mg/day. Further recommendations to follow.

## 2020-01-29 NOTE — Telephone Encounter (Signed)
Called [facility] and spoke with [place]. PA pending review for EGD. Ref# [id number]

## 2020-01-30 ENCOUNTER — Ambulatory Visit (HOSPITAL_COMMUNITY): Payer: Medicare Other

## 2020-01-30 ENCOUNTER — Other Ambulatory Visit (HOSPITAL_COMMUNITY): Payer: Self-pay | Admitting: Neurology

## 2020-01-30 DIAGNOSIS — M25551 Pain in right hip: Secondary | ICD-10-CM | POA: Diagnosis not present

## 2020-01-30 DIAGNOSIS — G4489 Other headache syndrome: Secondary | ICD-10-CM | POA: Diagnosis not present

## 2020-01-30 DIAGNOSIS — I10 Essential (primary) hypertension: Secondary | ICD-10-CM | POA: Diagnosis not present

## 2020-01-30 DIAGNOSIS — M545 Low back pain, unspecified: Secondary | ICD-10-CM

## 2020-01-30 DIAGNOSIS — G4700 Insomnia, unspecified: Secondary | ICD-10-CM | POA: Diagnosis not present

## 2020-02-02 ENCOUNTER — Other Ambulatory Visit (HOSPITAL_COMMUNITY): Payer: Self-pay | Admitting: Neurology

## 2020-02-02 ENCOUNTER — Ambulatory Visit (HOSPITAL_COMMUNITY)
Admission: RE | Admit: 2020-02-02 | Discharge: 2020-02-02 | Disposition: A | Discharge status: Home / Self Care | Payer: Medicare Other | Source: Ambulatory Visit | Attending: Neurology | Admitting: Neurology

## 2020-02-02 ENCOUNTER — Other Ambulatory Visit: Payer: Self-pay

## 2020-02-02 ENCOUNTER — Other Ambulatory Visit (HOSPITAL_COMMUNITY)
Admission: RE | Admit: 2020-02-02 | Discharge: 2020-02-02 | Disposition: A | Discharge status: Home / Self Care | Payer: Medicare Other | Source: Ambulatory Visit | Attending: Gastroenterology | Admitting: Gastroenterology

## 2020-02-02 DIAGNOSIS — R7989 Other specified abnormal findings of blood chemistry: Secondary | ICD-10-CM | POA: Diagnosis not present

## 2020-02-02 DIAGNOSIS — M25551 Pain in right hip: Secondary | ICD-10-CM

## 2020-02-02 DIAGNOSIS — M545 Low back pain, unspecified: Secondary | ICD-10-CM

## 2020-02-02 DIAGNOSIS — M1611 Unilateral primary osteoarthritis, right hip: Secondary | ICD-10-CM | POA: Diagnosis not present

## 2020-02-02 DIAGNOSIS — M47816 Spondylosis without myelopathy or radiculopathy, lumbar region: Secondary | ICD-10-CM | POA: Diagnosis not present

## 2020-02-02 LAB — CBC WITH DIFFERENTIAL/PLATELET
Abs Immature Granulocytes: 0.03 10*3/uL (ref 0.00–0.07)
Basophils Absolute: 0 10*3/uL (ref 0.0–0.1)
Basophils Relative: 0 %
Eosinophils Absolute: 0.3 10*3/uL (ref 0.0–0.5)
Eosinophils Relative: 3 %
HCT: 34 % — ABNORMAL LOW (ref 36.0–46.0)
Hemoglobin: 10.9 g/dL — ABNORMAL LOW (ref 12.0–15.0)
Immature Granulocytes: 0 %
Lymphocytes Relative: 23 %
Lymphs Abs: 1.9 10*3/uL (ref 0.7–4.0)
MCH: 29 pg (ref 26.0–34.0)
MCHC: 32.1 g/dL (ref 30.0–36.0)
MCV: 90.4 fL (ref 80.0–100.0)
Monocytes Absolute: 0.2 10*3/uL (ref 0.1–1.0)
Monocytes Relative: 2 %
Neutro Abs: 6 10*3/uL (ref 1.7–7.7)
Neutrophils Relative %: 72 %
Platelets: 194 10*3/uL (ref 150–400)
RBC: 3.76 MIL/uL — ABNORMAL LOW (ref 3.87–5.11)
RDW: 15.4 % (ref 11.5–15.5)
WBC: 8.5 10*3/uL (ref 4.0–10.5)
nRBC: 0 % (ref 0.0–0.2)

## 2020-02-02 LAB — COMPREHENSIVE METABOLIC PANEL
ALT: 38 U/L (ref 0–44)
AST: 58 U/L — ABNORMAL HIGH (ref 15–41)
Albumin: 3.8 g/dL (ref 3.5–5.0)
Alkaline Phosphatase: 242 U/L — ABNORMAL HIGH (ref 38–126)
Anion gap: 8 (ref 5–15)
BUN: 25 mg/dL — ABNORMAL HIGH (ref 8–23)
CO2: 22 mmol/L (ref 22–32)
Calcium: 9.4 mg/dL (ref 8.9–10.3)
Chloride: 106 mmol/L (ref 98–111)
Creatinine, Ser: 1.28 mg/dL — ABNORMAL HIGH (ref 0.44–1.00)
GFR calc Af Amer: 50 mL/min — ABNORMAL LOW (ref >60)
GFR calc non Af Amer: 43 mL/min — ABNORMAL LOW (ref >60)
Glucose, Bld: 132 mg/dL — ABNORMAL HIGH (ref 70–99)
Potassium: 4.4 mmol/L (ref 3.5–5.1)
Sodium: 136 mmol/L (ref 135–145)
Total Bilirubin: 0.6 mg/dL (ref 0.3–1.2)
Total Protein: 8.6 g/dL — ABNORMAL HIGH (ref 6.5–8.1)

## 2020-02-02 LAB — HEPATITIS B SURFACE ANTIGEN: Hepatitis B Surface Ag: NONREACTIVE

## 2020-02-02 LAB — HEPATITIS B CORE ANTIBODY, TOTAL: Hep B Core Total Ab: NONREACTIVE

## 2020-02-02 LAB — PROTIME-INR
INR: 1 (ref 0.8–1.2)
Prothrombin Time: 13 seconds (ref 11.4–15.2)

## 2020-02-03 LAB — HEPATITIS B SURFACE ANTIBODY,QUALITATIVE: Hep B S Ab: REACTIVE — AB

## 2020-02-03 LAB — HCV RNA QUANT: HCV Quantitative: NOT DETECTED IU/mL (ref >50)

## 2020-02-04 NOTE — Telephone Encounter (Signed)
PA approved. Auth# [id number] dates 02/24/2020-05/24/2020

## 2020-02-05 ENCOUNTER — Other Ambulatory Visit: Payer: Self-pay

## 2020-02-05 ENCOUNTER — Ambulatory Visit (HOSPITAL_COMMUNITY)
Admission: RE | Admit: 2020-02-05 | Discharge: 2020-02-05 | Disposition: A | Discharge status: Home / Self Care | Payer: Medicare Other | Source: Ambulatory Visit | Attending: Gastroenterology | Admitting: Gastroenterology

## 2020-02-05 DIAGNOSIS — I700 Atherosclerosis of aorta: Secondary | ICD-10-CM | POA: Diagnosis not present

## 2020-02-05 DIAGNOSIS — R7989 Other specified abnormal findings of blood chemistry: Secondary | ICD-10-CM | POA: Diagnosis not present

## 2020-02-05 NOTE — Progress Notes (Signed)
Hemoglobin stable at 10.9.  LFTs continue to be elevated but are significantly improved with alk phos 242, AST 58, ALT 38 (down from alk phos 675, AST 218, ALT 108 in May 2021).  Hepatitis C is negative.  She has immunity to hepatitis B.  We are waiting on hemochromatosis DNA labs to result.  She has worsening of her kidney function and needs to ensure she is drinking enough water to keep your urine pale yellow to clear.  She will need to follow-up with her PCP on this.  Please fax lab results to PCP.

## 2020-02-05 NOTE — Progress Notes (Signed)
Abdominal US with normal appearing gallbladder, biliary tree, and liver. Nothing to explain her elevated LFTs.

## 2020-02-06 LAB — HEMOCHROMATOSIS DNA-PCR(C282Y,H63D)

## 2020-02-08 NOTE — Progress Notes (Signed)
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   I would like to recheck her iron panel with ferritin to see if this continues to be elevated. If this continues to be elevated, we will likely need to refer her to hematology.   Please arrange iron panel with ferritin.

## 2020-02-10 ENCOUNTER — Other Ambulatory Visit: Payer: Self-pay | Admitting: Emergency Medicine

## 2020-02-10 DIAGNOSIS — R7989 Other specified abnormal findings of blood chemistry: Secondary | ICD-10-CM

## 2020-02-18 ENCOUNTER — Other Ambulatory Visit: Payer: Self-pay

## 2020-02-18 ENCOUNTER — Encounter: Payer: Self-pay | Admitting: Cardiology

## 2020-02-18 ENCOUNTER — Ambulatory Visit (INDEPENDENT_AMBULATORY_CARE_PROVIDER_SITE_OTHER): Payer: Medicare Other | Admitting: Cardiology

## 2020-02-18 VITALS — BP 108/60 | HR 78 | Ht 64.0 in | Wt 120.0 lb

## 2020-02-18 DIAGNOSIS — I10 Essential (primary) hypertension: Secondary | ICD-10-CM

## 2020-02-18 DIAGNOSIS — E782 Mixed hyperlipidemia: Secondary | ICD-10-CM

## 2020-02-18 DIAGNOSIS — R0789 Other chest pain: Secondary | ICD-10-CM | POA: Diagnosis not present

## 2020-02-18 NOTE — Progress Notes (Signed)
Clinical Summary Cynthia [name] is a 69 y.o.female seen today for follow up of the following medical problems.   1. Chest pain - last seen in 2016. History of chest pain at that time.  04/2014 Nuclear stress test without ischemia.  05/2017 echo LVEF 60-65%, no WMAs, grade I diastolic dysfunction, mild MR 10/2018 CT chest coronary calcifications of LAD and RCA, mild to mod aortic atherosclerosis   CAD risk factors: HTN, HL, +smoker nearly 50 years, mother and father with heart troubles unknown specifics.   04/2019 nuclear stress: no ischemia - ASA causes stomach upset.   - chronic chest pains unchanged. Better with SL NG 1-2 times per month   2. HTN - compliant with meds  3. Hyperlipidemia  - upcoming labs with pcp - compliant with statin  4. COPD - followed by pcp Past Medical History:  Diagnosis Date  . Allergy   . Anemia   . Angina pectoris ([place])   . Anxiety   . Arthritis   . Bronchitis   . Bronchitis   . COPD (chronic obstructive pulmonary disease) ([place])   . Depression   . GERD (gastroesophageal reflux disease)   . HCV (hepatitis C virus)    2018 COMPLETE VIROLOGIC RESPONSE  . High blood cholesterol level   . Hypertension   . Migraine   . Migraines   . Neuromuscular disorder ([place])   . Pneumonia   . PTSD (post-traumatic stress disorder)   . 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 Asa [Aspirin] Other (See Comments)    REACTION: Stomach upset  . Ibuprofen     STOMACH UPSET     Current Outpatient Medications  Medication Sig Dispense Refill  . albuterol (PROVENTIL) (2.5 MG/3ML) 0.083% nebulizer solution INHALE 1 VIAL VIA NEBULIZER EVERY 6 HOURS AS NEEDED FOR WHEEZING AND SHORTNESS OF BREATH (Patient not taking: Reported on 01/29/2020) 150 mL 1  . albuterol (VENTOLIN HFA) 108 (90 Base) MCG/ACT inhaler Inhale 1 puff into the lungs every 6 (six) hours as needed for wheezing or shortness of breath.       Marland Kitchen amLODipine (NORVASC) 10 MG tablet Take 10 mg by mouth daily.    . Ascorbic Acid (VITAMIN C PO) Take 1 tablet by mouth daily.    Marland Kitchen atorvastatin (LIPITOR) 40 MG tablet Take 40 mg by mouth daily.     . butalbital-acetaminophen-caffeine (FIORICET) 50-325-40 MG tablet Take 1 tablet by mouth 2 (two) times daily as needed for headache.    . Calcium-Magnesium-Zinc (CAL-MAG-ZINC PO) Take 1 tablet by mouth daily.     Marland Kitchen FLUoxetine (PROZAC) 40 MG capsule Take 40 mg by mouth daily.     . furosemide (LASIX) 20 MG tablet TAKE ONE TABLET BY mouth daily (Patient taking differently: Take 20 mg by mouth daily. TAKE ONE TABLET BY mouth daily) 90 tablet 0  . gabapentin (NEURONTIN) 100 MG capsule Take 100 mg by mouth at bedtime as needed (pain).     Marland Kitchen gabapentin (NEURONTIN) 300 MG capsule Take 300 mg by mouth at bedtime as needed (pain).     . LORazepam (ATIVAN) 0.5 MG tablet Take 0.5 mg by mouth at bedtime as needed for anxiety.     . meloxicam (MOBIC) 15 MG tablet Take 1 tablet (15 mg total) by mouth daily. (Patient taking differently: Take 15 mg by mouth daily as needed. ) 90 tablet 3  . mirtazapine (REMERON) 15 MG tablet Take 15 mg by  mouth at bedtime.    . Multiple Vitamin (MULTIVITAMIN) tablet Take 1 tablet by mouth daily.    . nitroGLYCERIN (NITROSTAT) 0.4 MG SL tablet Place 0.4 mg under the tongue every 5 (five) minutes as needed for chest pain.    Marland Kitchen omeprazole (PRILOSEC) 20 MG capsule TAKE 1 CAPSULE BY MOUTH DAILY BEFORE A MEAL. (Patient taking differently: Take 20 mg by mouth daily. ) 90 capsule 3  . propranolol (INDERAL) 80 MG tablet Take 1 tablet (80 mg total) by mouth 2 (two) times daily. 180 tablet 1  . tiZANidine (ZANAFLEX) 2 MG tablet Take 1-2 tablets by mouth every 8 (eight) hours as needed.    . traMADol (ULTRAM) 50 MG tablet Take 50-100 mg by mouth every 6 (six) hours as needed.    . traZODone (DESYREL) 50 MG tablet Take 25 mg by mouth at bedtime as needed for sleep.      No current  facility-administered medications for this visit.     Past Surgical History:  Procedure Laterality Date  . BIOPSY  02/09/2015   Procedure: GASTRIC BIOPSIES ;  Surgeon: Danie Binder, MD;  Location: [facility];  Service: Endoscopy;;  . COLONOSCOPY WITH PROPOFOL N/A 02/09/2015   SLF: 1. the examined terminal ileum appeared to be normal 2. the colonic mucosa appeared normal 3. small internal hemorrhoids  . ESOPHAGOGASTRODUODENOSCOPY (EGD) WITH PROPOFOL N/A 02/09/2015   SLF: 1. stricture at the gastroesophageal junction 2. mild non-erosive gastritis   . GIVENS CAPSULE STUDY N/A 10/19/2016   Procedure: GIVENS CAPSULE STUDY;  Surgeon: Danie Binder, MD;  Location: [facility];  Service: Endoscopy;  Laterality: N/A;  7:30am, pt to arrive at 7:00am  . left arm     ? nerve repaired, " it was pinched".  . left elbow surgery Left   . SAVORY DILATION N/A 02/09/2015   Procedure: SAVORY DILATION 12.48mm, 31mm, 4mm, 26mm;  Surgeon: Danie Binder, MD;  Location: [facility];  Service: Endoscopy;  Laterality: N/A;  . TONSILLECTOMY    . TUBAL LIGATION       Allergies  Allergen Reactions  . Asa [Aspirin] Other (See Comments)    REACTION: Stomach upset  . Ibuprofen     STOMACH UPSET      Family History  Problem Relation Age of Onset  . Heart failure Father   . Hypertension Father   . Stroke Father   . Colon cancer Father        greater than age 10  . Cancer Father   . Dementia Father   . Cancer Mother   . Seizures Sister      Social History Cynthia [name] reports that she has been smoking cigarettes. She started smoking about 47 years ago. She has a 10.50 pack-year smoking history. She has never used smokeless tobacco. Cynthia [name] reports no history of alcohol use.   Review of Systems CONSTITUTIONAL: No weight loss, fever, chills, weakness or fatigue.  HEENT: Eyes: No visual loss, blurred vision, double vision or yellow sclerae.No hearing loss, sneezing, congestion, runny nose or sore throat.    SKIN: No rash or itching.  CARDIOVASCULAR: per hpi RESPIRATORY: No shortness of breath, cough or sputum.  GASTROINTESTINAL: No anorexia, nausea, vomiting or diarrhea. No abdominal pain or blood.  GENITOURINARY: No burning on urination, no polyuria NEUROLOGICAL: No headache, dizziness, syncope, paralysis, ataxia, numbness or tingling in the extremities. No change in bowel or bladder control.  MUSCULOSKELETAL: No muscle, back pain, joint pain or stiffness.  LYMPHATICS:  No enlarged nodes. No history of splenectomy.  PSYCHIATRIC: No history of depression or anxiety.  ENDOCRINOLOGIC: No reports of sweating, cold or heat intolerance. No polyuria or polydipsia.  Marland Kitchen   Physical Examination Today's Vitals   02/18/20 0856  BP: 108/60  Pulse: 78  SpO2: 98%  Weight: 120 lb (54.4 kg)  Height: 5\' 4"  (1.626 m)   Body mass index is 20.6 kg/m.  Gen: resting comfortably, no acute distress HEENT: no scleral icterus, pupils equal round and reactive, no palptable cervical adenopathy,  CV: RRR, 2/6 systolic murmur rusb, no jvd Resp: Clear to auscultation bilaterally GI: abdomen is soft, non-tender, non-distended, normal bowel sounds, no hepatosplenomegaly MSK: extremities are warm, no edema.  Skin: warm, no rash Neuro:  no focal deficits Psych: appropriate affect     Assessment and Plan   1. Chest pain - several year history of chest pains.  - most recent symptoms atypical but doe to multiple CAD risk factors and recent chest CT showing some coronary calcifications we referred for a nuclear stress test that came back without ischemia - ongoing atypical symptoms that are unchanged, continue to monitor at this time.   2. HTN - she is at goal, continue current meds  3. Hyperlipidemia -continue statin, request labs from pcp   Arnoldo Lenis, M.D

## 2020-02-18 NOTE — Patient Instructions (Signed)
56    Your procedure is scheduled on: 02/24/2020  Report to [facility] at     12:45 PM.  Call this number if you have problems the morning of surgery: [phone]   Remember:   Follow instructions on letter from office regarding when to stop eating and drinking        No Smoking the day of procedure      Take these medicines the morning of surgery with A SIP OF WATER: Amlodipine, Prozac, omeprazole, and inderal    (Gabapentin, ativan, zanaflex or ultram if needed)   Do not wear jewelry, make-up or nail polish.  Do not wear lotions, powders, or perfumes. You may wear deodorant.                Do not bring valuables to the hospital.  Contacts, dentures or bridgework may not be worn into surgery.  Leave suitcase in the car. After surgery it may be brought to your room.  For patients admitted to the hospital, checkout time is 11:00 AM the day of discharge.   Patients discharged the day of surgery will not be allowed to drive home. Upper Endoscopy, Adult Upper endoscopy is a procedure to look inside the upper GI (gastrointestinal) tract. The upper GI tract is made up of:  The part of the body that moves food from your mouth to your stomach (esophagus).  The stomach.  The first part of your small intestine (duodenum). This procedure is also called esophagogastroduodenoscopy (EGD) or gastroscopy. In this procedure, your health care provider passes a thin, flexible tube (endoscope) through your mouth and down your esophagus into your stomach. A small camera is attached to the end of the tube. Images from the camera appear on a monitor in the exam room. During this procedure, your health care provider may also remove a small piece of tissue to be sent to a lab and examined under a microscope (biopsy). Your health care provider may do an upper endoscopy to diagnose cancers of the upper GI tract. You may also have this procedure to find the cause of other conditions, such as:  Stomach  pain.  Heartburn.  Pain or problems when swallowing.  Nausea and vomiting.  Stomach bleeding.  Stomach ulcers. Tell a health care provider about:  Any allergies you have.  All medicines you are taking, including vitamins, herbs, eye drops, creams, and over-the-counter medicines.  Any problems you or family members have had with anesthetic medicines.  Any blood disorders you have.  Any surgeries you have had.  Any medical conditions you have.  Whether you are pregnant or may be pregnant. What are the risks? Generally, this is a safe procedure. However, problems may occur, including:  Infection.  Bleeding.  Allergic reactions to medicines.  A tear or hole (perforation) in the esophagus, stomach, or duodenum. What happens before the procedure? Staying hydrated Follow instructions from your health care provider about hydration, which may include:  Up to 2 hours before the procedure - you may continue to drink clear liquids, such as water, clear fruit juice, black coffee, and plain tea.  Eating and drinking restrictions Follow instructions from your health care provider about eating and drinking, which may include:  8 hours before the procedure - stop eating heavy meals or foods, such as meat, fried foods, or fatty foods.  6 hours before the procedure - stop eating light meals or foods, such as toast or cereal.  6 hours before the procedure - stop  drinking milk or drinks that contain milk.  2 hours before the procedure - stop drinking clear liquids. Medicines Ask your health care provider about:  Changing or stopping your regular medicines. This is especially important if you are taking diabetes medicines or blood thinners.  Taking medicines such as aspirin and ibuprofen. These medicines can thin your blood. Do not take these medicines unless your health care provider tells you to take them.  Taking over-the-counter medicines, vitamins, herbs, and  supplements. General instructions  Plan to have someone take you home from the hospital or clinic.  If you will be going home right after the procedure, plan to have someone with you for 24 hours.  Ask your health care provider what steps will be taken to help prevent infection. What happens during the procedure?  1. An IV will be inserted into one of your veins. 2. You may be given one or more of the following: ? A medicine to help you relax (sedative). ? A medicine to numb the throat (local anesthetic). 3. You will lie on your left side on an exam table. 4. Your health care provider will pass the endoscope through your mouth and down your esophagus. 5. Your health care provider will use the scope to check the inside of your esophagus, stomach, and duodenum. Biopsies may be taken. 6. The endoscope will be removed. The procedure may vary among health care providers and hospitals. What happens after the procedure?  Your blood pressure, heart rate, breathing rate, and blood oxygen level will be monitored until you leave the hospital or clinic.  Do not drive for 24 hours if you were given a sedative during your procedure.  When your throat is no longer numb, you may be given some fluids to drink.  It is up to you to get the results of your procedure. Ask your health care provider, or the department that is doing the procedure, when your results will be ready. Summary  Upper endoscopy is a procedure to look inside the upper GI tract.  During the procedure, an IV will be inserted into one of your veins. You may be given a medicine to help you relax.  A medicine will be used to numb your throat.  The endoscope will be passed through your mouth and down your esophagus. This information is not intended to replace advice given to you by your health care provider. Make sure you discuss any questions you have with your health care provider. Document Revised: 12/19/2017 Document Reviewed:  11/26/2017 Elsevier Patient Education  [place] After  Please read the instructions outlined below and refer to this sheet in the next few weeks. These discharge instructions provide you with general information  on caring for yourself after you leave the hospital. Your doctor may also give you specific instructions. While your treatment has been planned according to the most current medical practices available, unavoidable complications occasionally occur. If you have any problems or questions after discharge, please call your doctor. HOME CARE INSTRUCTIONS Activity  You may resume your regular activity but move at a slower pace for the next 24 hours.   Take frequent rest periods for the next 24 hours.   Walking will help expel (get rid of) the air and reduce the bloated feeling in your abdomen.   No driving for 24 hours (because of the anesthesia (medicine) used during the test).   You may shower.   Do not sign any important legal documents or operate any machinery for 24 hours (because of the anesthesia used during the test).  Nutrition  Drink plenty of fluids.   You may resume your normal diet.   Begin with a light meal and progress to your normal diet.   Avoid alcoholic beverages for 24 hours or as instructed by your caregiver.  Medications You may resume your normal medications unless your caregiver tells you otherwise. What you can expect today  You may experience abdominal discomfort such as a feeling of fullness or "gas" pains.   You may experience a sore throat for 2 to 3 days. This is normal. Gargling with salt water may help this.  Follow-up Your doctor will discuss the results of your test with you. SEEK IMMEDIATE MEDICAL CARE IF:  You have excessive nausea (feeling sick to your stomach) and/or vomiting.    You have severe abdominal pain and distention (swelling).   You have trouble swallowing.   You have a temperature over 100 F (37.8 C).   You have rectal bleeding or vomiting of blood.  Document Released: 02/08/2004 Document Revised: 06/15/2011 Document Reviewed: 08/21/2007

## 2020-02-18 NOTE — Patient Instructions (Signed)

## 2020-02-23 ENCOUNTER — Other Ambulatory Visit: Payer: Self-pay

## 2020-02-23 ENCOUNTER — Encounter (HOSPITAL_COMMUNITY): Payer: Self-pay

## 2020-02-23 ENCOUNTER — Other Ambulatory Visit (HOSPITAL_COMMUNITY)
Admission: RE | Admit: 2020-02-23 | Discharge: 2020-02-23 | Disposition: A | Discharge status: Home / Self Care | Payer: Medicare Other | Source: Ambulatory Visit | Attending: Internal Medicine | Admitting: Internal Medicine

## 2020-02-23 ENCOUNTER — Encounter (HOSPITAL_COMMUNITY)
Admission: RE | Admit: 2020-02-23 | Discharge: 2020-02-23 | Disposition: A | Discharge status: Home / Self Care | Payer: Medicare Other | Source: Ambulatory Visit | Attending: Internal Medicine | Admitting: Internal Medicine

## 2020-02-23 DIAGNOSIS — Z20822 Contact with and (suspected) exposure to covid-19: Secondary | ICD-10-CM | POA: Diagnosis not present

## 2020-02-23 DIAGNOSIS — Z01818 Encounter for other preprocedural examination: Secondary | ICD-10-CM | POA: Insufficient documentation

## 2020-02-23 LAB — IRON AND TIBC
Iron: 101 ug/dL (ref 28–170)
Saturation Ratios: 37 % — ABNORMAL HIGH (ref 10.4–31.8)
TIBC: 275 ug/dL (ref 250–450)
UIBC: 174 ug/dL

## 2020-02-23 LAB — FERRITIN: Ferritin: 58 ng/mL (ref 11–307)

## 2020-02-23 LAB — SARS CORONAVIRUS 2 (TAT 6-24 HRS): SARS Coronavirus 2: NEGATIVE

## 2020-02-24 ENCOUNTER — Encounter (HOSPITAL_COMMUNITY)
Admission: RE | Disposition: A | Discharge status: Home / Self Care | Payer: Self-pay | Source: Home / Self Care | Attending: Internal Medicine

## 2020-02-24 ENCOUNTER — Ambulatory Visit (HOSPITAL_COMMUNITY)
Admission: RE | Admit: 2020-02-24 | Discharge: 2020-02-24 | Disposition: A | Discharge status: Home / Self Care | Payer: Medicare Other | Attending: Internal Medicine | Admitting: Internal Medicine

## 2020-02-24 ENCOUNTER — Ambulatory Visit (HOSPITAL_COMMUNITY): Payer: Medicare Other | Admitting: Anesthesiology

## 2020-02-24 ENCOUNTER — Encounter (HOSPITAL_COMMUNITY): Payer: Self-pay | Admitting: *Deleted

## 2020-02-24 DIAGNOSIS — F329 Major depressive disorder, single episode, unspecified: Secondary | ICD-10-CM | POA: Diagnosis not present

## 2020-02-24 DIAGNOSIS — R6881 Early satiety: Secondary | ICD-10-CM | POA: Diagnosis not present

## 2020-02-24 DIAGNOSIS — I739 Peripheral vascular disease, unspecified: Secondary | ICD-10-CM | POA: Diagnosis not present

## 2020-02-24 DIAGNOSIS — K2970 Gastritis, unspecified, without bleeding: Secondary | ICD-10-CM

## 2020-02-24 DIAGNOSIS — I10 Essential (primary) hypertension: Secondary | ICD-10-CM | POA: Insufficient documentation

## 2020-02-24 DIAGNOSIS — J449 Chronic obstructive pulmonary disease, unspecified: Secondary | ICD-10-CM | POA: Insufficient documentation

## 2020-02-24 DIAGNOSIS — R634 Abnormal weight loss: Secondary | ICD-10-CM | POA: Diagnosis not present

## 2020-02-24 DIAGNOSIS — Z681 Body mass index (BMI) 19 or less, adult: Secondary | ICD-10-CM | POA: Diagnosis not present

## 2020-02-24 DIAGNOSIS — F419 Anxiety disorder, unspecified: Secondary | ICD-10-CM | POA: Insufficient documentation

## 2020-02-24 DIAGNOSIS — E7800 Pure hypercholesterolemia, unspecified: Secondary | ICD-10-CM | POA: Diagnosis not present

## 2020-02-24 DIAGNOSIS — Z79899 Other long term (current) drug therapy: Secondary | ICD-10-CM | POA: Insufficient documentation

## 2020-02-24 DIAGNOSIS — K319 Disease of stomach and duodenum, unspecified: Secondary | ICD-10-CM | POA: Diagnosis not present

## 2020-02-24 DIAGNOSIS — F17210 Nicotine dependence, cigarettes, uncomplicated: Secondary | ICD-10-CM | POA: Insufficient documentation

## 2020-02-24 DIAGNOSIS — K219 Gastro-esophageal reflux disease without esophagitis: Secondary | ICD-10-CM | POA: Diagnosis not present

## 2020-02-24 DIAGNOSIS — K3189 Other diseases of stomach and duodenum: Secondary | ICD-10-CM | POA: Diagnosis not present

## 2020-02-24 HISTORY — PX: ESOPHAGOGASTRODUODENOSCOPY (EGD) WITH PROPOFOL: SHX5813

## 2020-02-24 HISTORY — PX: BIOPSY: SHX5522

## 2020-02-24 SURGERY — ESOPHAGOGASTRODUODENOSCOPY (EGD) WITH PROPOFOL
Anesthesia: General

## 2020-02-24 MED ORDER — LIDOCAINE VISCOUS HCL 2 % MT SOLN
15.0000 mL | Freq: Once | OROMUCOSAL | Status: AC
  Administered 2020-02-24: 15 mL via OROMUCOSAL

## 2020-02-24 MED ORDER — GLYCOPYRROLATE 0.2 MG/ML IJ SOLN
0.2000 mg | Freq: Once | INTRAMUSCULAR | Status: AC
  Administered 2020-02-24: 0.2 mg via INTRAVENOUS

## 2020-02-24 MED ORDER — CHLORHEXIDINE GLUCONATE CLOTH 2 % EX PADS: 6.0000 | MEDICATED_PAD | Freq: Once | CUTANEOUS | Status: DC

## 2020-02-24 MED ORDER — PROPOFOL 10 MG/ML IV BOLUS
INTRAVENOUS | Status: DC | PRN
  Administered 2020-02-24: 30 mg via INTRAVENOUS
  Administered 2020-02-24: 40 mg via INTRAVENOUS
  Administered 2020-02-24: 100 mg via INTRAVENOUS

## 2020-02-24 MED ORDER — LIDOCAINE VISCOUS HCL 2 % MT SOLN
OROMUCOSAL | Status: AC
  Filled 2020-02-24: qty 15

## 2020-02-24 MED ORDER — LIDOCAINE HCL (CARDIAC) PF 100 MG/5ML IV SOSY
PREFILLED_SYRINGE | INTRAVENOUS | Status: DC | PRN
  Administered 2020-02-24: 50 mg via INTRAVENOUS

## 2020-02-24 MED ORDER — LACTATED RINGERS IV SOLN: INTRAVENOUS | Status: DC | PRN

## 2020-02-24 MED ORDER — LACTATED RINGERS IV SOLN
Freq: Once | INTRAVENOUS | Status: AC
  Administered 2020-02-24: 1000 mL via INTRAVENOUS

## 2020-02-24 MED ORDER — GLYCOPYRROLATE 0.2 MG/ML IJ SOLN
INTRAMUSCULAR | Status: AC
  Filled 2020-02-24: qty 1

## 2020-02-24 MED ORDER — STERILE WATER FOR IRRIGATION IR SOLN
Status: DC | PRN
  Administered 2020-02-24: 1.5 mL

## 2020-02-24 NOTE — Discharge Instructions (Addendum)
EGD Discharge instructions Please read the instructions outlined below and refer to this sheet in the next few weeks. These discharge instructions provide you with general information on caring for yourself after you leave the hospital. Your doctor may also give you specific instructions. While your treatment has been planned according to the most current medical practices available, unavoidable complications occasionally occur. If you have any problems or questions after discharge, please call your doctor. ACTIVITY  You may resume your regular activity but move at a slower pace for the next 24 hours.   Take frequent rest periods for the next 24 hours.   Walking will help expel (get rid of) the air and reduce the bloated feeling in your abdomen.   No driving for 24 hours (because of the anesthesia (medicine) used during the test).   You may shower.   Do not sign any important legal documents or operate any machinery for 24 hours (because of the anesthesia used during the test).  NUTRITION  Drink plenty of fluids.   You may resume your normal diet.   Begin with a light meal and progress to your normal diet.   Avoid alcoholic beverages for 24 hours or as instructed by your caregiver.  MEDICATIONS  You may resume your normal medications unless your caregiver tells you otherwise.  WHAT YOU CAN EXPECT TODAY  You may experience abdominal discomfort such as a feeling of fullness or "gas" pains.  FOLLOW-UP  Your doctor will discuss the results of your test with you.  SEEK IMMEDIATE MEDICAL ATTENTION IF ANY OF THE FOLLOWING OCCUR:  Excessive nausea (feeling sick to your stomach) and/or vomiting.   Severe abdominal pain and distention (swelling).   Trouble swallowing.   Temperature over 101 F (37.8 C).   Rectal bleeding or vomiting of blood.   I found a small amount of inflammation in your stomach. Continue Protonix. Await pathology results of your biopsies (we will contact  you). Follow up with GI as previously scheduled.  I hope you have a great rest of your week!

## 2020-02-24 NOTE — Transfer of Care (Signed)
Immediate Anesthesia Transfer of Care Note  Patient: Cynthia [name] [facility]  Procedure(s) Performed: ESOPHAGOGASTRODUODENOSCOPY (EGD) WITH PROPOFOL (N/A ) BIOPSY  Patient Location: PACU  Anesthesia Type:General  Level of Consciousness: awake, alert , oriented and patient cooperative  Airway & Oxygen Therapy: Patient Spontanous Breathing and Patient connected to nasal cannula oxygen  Post-op Assessment: Report given to RN, Post -op Vital signs reviewed and stable and Patient moving all extremities  Post vital signs: Reviewed and stable  Last Vitals:  Vitals Value Taken Time  BP 115/61 02/24/20 1418  Temp    Pulse 86 02/24/20 1420  Resp 15 02/24/20 1420  SpO2 100 % 02/24/20 1420  Vitals shown include unvalidated device data.  Last Pain:  Vitals:   02/24/20 1410  TempSrc:   PainSc: 6       Patients Stated Pain Goal: 8 (65/80/06 3494)  Complications: No complications documented.

## 2020-02-24 NOTE — Anesthesia Postprocedure Evaluation (Signed)
Anesthesia Post Note  Patient: Cynthia [name]  Procedure(s) Performed: ESOPHAGOGASTRODUODENOSCOPY (EGD) WITH PROPOFOL (N/A ) BIOPSY  Patient location during evaluation: PACU Anesthesia Type: General Level of consciousness: awake, oriented, awake and alert and patient cooperative Pain management: pain level controlled Vital Signs Assessment: post-procedure vital signs reviewed and stable Respiratory status: respiratory function stable, spontaneous breathing, nonlabored ventilation and patient connected to nasal cannula oxygen Cardiovascular status: blood pressure returned to baseline and stable Postop Assessment: no headache and no backache Anesthetic complications: no   No complications documented.   Last Vitals:  Vitals:   02/24/20 1317 02/24/20 1418  BP: 134/69 (P) 115/61  Resp: 20   Temp: 37.1 C (!) (P) 36.3 C  SpO2: 99%     Last Pain:  Vitals:   02/24/20 1410  TempSrc:   PainSc: [place]

## 2020-02-24 NOTE — Op Note (Signed)
[facility] Patient Name: Cynthia [name] Procedure Date: 02/24/2020 2:06 PM MRN: [id number] Date of Birth: 1951/04/17 Attending MD: Elon Alas. Edgar Frisk CSN: [id number] Age: 69 Admit Type: Outpatient Procedure:                Upper GI endoscopy Indications:              Early satiety, Weight loss Providers:                Elon Alas. Paola Flynt, DO, [place]is [place] B. Sharon Seller, RN,                            Caprice Kluver, Crystal Page, Nelma Rothman, Technician Referring MD:              Medicines:                See the Anesthesia note for documentation of the                            administered medications Complications:            No immediate complications. Estimated Blood Loss:     Estimated blood loss was minimal. Procedure:                Pre-Anesthesia Assessment:                           - The anesthesia plan was to use monitored                            anesthesia care (MAC).                           After obtaining informed consent, the endoscope was                            passed under direct vision. Throughout the                            procedure, the patient's blood pressure, pulse, and                            oxygen saturations were monitored continuously. The                            [id number]-[id number] ([id number]) scope was introduced through the                            mouth, and advanced to the second part of duodenum.                            The upper GI endoscopy was accomplished without                            difficulty. The patient tolerated the procedure  well. Scope In: 2:12:42 PM Scope Out: 2:15:36 PM Total Procedure Duration: 0 hours 2 minutes 54 seconds  Findings:      There is no endoscopic evidence of Barrett's esophagus, bleeding, areas       of erosion, esophagitis, hiatal hernia, stenosis, stricture, ulcerations       or varices in the entire esophagus.      Diffuse mild inflammation characterized by erythema was  found in the       entire examined stomach. Biopsies were taken with a cold forceps for       Helicobacter pylori testing.      The duodenal bulb, first portion of the duodenum and second portion of       the duodenum were normal. Biopsies for histology were taken with a cold       forceps for evaluation of celiac disease. Impression:               - Gastritis. Biopsied.                           - Normal duodenal bulb, first portion of the                            duodenum and second portion of the duodenum.                            Biopsied. Moderate Sedation:      Per Anesthesia Care Recommendation:           - Patient has a contact number available for                            emergencies. The signs and symptoms of potential                            delayed complications were discussed with the                            patient. Return to normal activities tomorrow.                            Written discharge instructions were provided to the                            patient.                           - Resume previous diet.                           - Continue present medications.                           - Await pathology results.                           - Return to GI clinic as previously scheduled. Procedure Code(s):        --- Professional ---  40086, Esophagogastroduodenoscopy, flexible,                            transoral; with biopsy, single or multiple Diagnosis Code(s):        --- Professional ---                           K29.70, Gastritis, unspecified, without bleeding                           R68.81, Early satiety                           R63.4, Abnormal weight loss CPT copyright 2019 American Medical Association. All rights reserved. The codes documented in this report are preliminary and upon coder review may  be revised to meet current compliance requirements. Elon Alas. Abbey Chatters, [place] Abbey Chatters, DO 02/24/2020 2:18:27  PM This report has been signed electronically. Number of Addenda: 0

## 2020-02-24 NOTE — H&P (Signed)
Primary Care Physician:  Celene Squibb, MD Primary Gastroenterologist:  Dr. Abbey Chatters  Pre-Procedure History & Physical: HPI:  Cynthia [name] is a 69 y.o. female is here for a diagnostic EGD due to history of poor appetite and weight loss  Past Medical History:  Diagnosis Date  . Allergy   . Anemia   . Angina pectoris ([place])   . Anxiety   . Arthritis   . Bronchitis   . Bronchitis   . COPD (chronic obstructive pulmonary disease) ([place])   . Depression   . GERD (gastroesophageal reflux disease)   . HCV (hepatitis C virus)    2018 COMPLETE VIROLOGIC RESPONSE  . High blood cholesterol level   . Hypertension   . Migraine   . Migraines   . Neuromuscular disorder ([place])   . Pneumonia   . PTSD (post-traumatic stress disorder)   . Substance abuse in remission ([place]) 05/02/2017    Past Surgical History:  Procedure Laterality Date  . BIOPSY  02/09/2015   Procedure: GASTRIC BIOPSIES ;  Surgeon: Danie Binder, MD;  Location: [facility];  Service: Endoscopy;;  . COLONOSCOPY WITH PROPOFOL N/A 02/09/2015   SLF: 1. the examined terminal ileum appeared to be normal 2. the colonic mucosa appeared normal 3. small internal hemorrhoids  . ESOPHAGOGASTRODUODENOSCOPY (EGD) WITH PROPOFOL N/A 02/09/2015   SLF: 1. stricture at the gastroesophageal junction 2. mild non-erosive gastritis   . GIVENS CAPSULE STUDY N/A 10/19/2016   Procedure: GIVENS CAPSULE STUDY;  Surgeon: Danie Binder, MD;  Location: [facility];  Service: Endoscopy;  Laterality: N/A;  7:30am, pt to arrive at 7:00am  . left arm     ? nerve repaired, " it was pinched".  . left elbow surgery Left   . SAVORY DILATION N/A 02/09/2015   Procedure: SAVORY DILATION 12.62mm, 55mm, 63mm, 50mm;  Surgeon: Danie Binder, MD;  Location: [facility];  Service: Endoscopy;  Laterality: N/A;  . TONSILLECTOMY    . TUBAL LIGATION      Prior to Admission medications   Medication Sig Start Date End Date Taking? Authorizing Provider  albuterol (VENTOLIN HFA) 108 (90  Base) MCG/ACT inhaler Inhale 1 puff into the lungs every 6 (six) hours as needed for wheezing or shortness of breath.  01/06/19  Yes [provider]  amLODipine (NORVASC) 10 MG tablet Take 10 mg by mouth daily.   Yes [provider]  Ascorbic Acid (VITAMIN C PO) Take 1 tablet by mouth daily.   Yes [provider]  atorvastatin (LIPITOR) 40 MG tablet Take 40 mg by mouth daily.  04/09/19  Yes [provider]  butalbital-acetaminophen-caffeine (FIORICET) 50-325-40 MG tablet Take 1 tablet by mouth 2 (two) times daily as needed for headache. 04/18/19  Yes [provider]  Calcium-Magnesium-Zinc (CAL-MAG-ZINC PO) Take 1 tablet by mouth daily.    Yes [provider]  FLUoxetine (PROZAC) 40 MG capsule Take 40 mg by mouth daily.  08/13/17  Yes [provider]  furosemide (LASIX) 20 MG tablet TAKE ONE TABLET BY mouth daily Patient taking differently: Take 20 mg by mouth daily. TAKE ONE TABLET BY mouth daily 08/27/17  Yes Raylene Everts, MD  gabapentin (NEURONTIN) 100 MG capsule Take 100 mg by mouth at bedtime as needed (pain).  01/29/20  Yes [provider]  gabapentin (NEURONTIN) 300 MG capsule Take 300 mg by mouth at bedtime as needed (pain).    Yes [provider]  LORazepam (ATIVAN) 0.5 MG tablet Take 0.5  mg by mouth at bedtime as needed for anxiety.    Yes [provider]  meloxicam (MOBIC) 15 MG tablet Take 1 tablet (15 mg total) by mouth daily. Patient taking differently: Take 15 mg by mouth daily as needed.  08/13/17  Yes Raylene Everts, MD  mirtazapine (REMERON) 15 MG tablet Take 15 mg by mouth at bedtime.   Yes [provider]  Multiple Vitamin (MULTIVITAMIN) tablet Take 1 tablet by mouth daily.   Yes [provider]  nitroGLYCERIN (NITROSTAT) 0.4 MG SL tablet Place 0.4 mg under the tongue every 5 (five) minutes as needed for chest pain.   Yes [provider]  omeprazole (PRILOSEC) 20  MG capsule TAKE 1 CAPSULE BY MOUTH DAILY BEFORE A MEAL. Patient taking differently: Take 20 mg by mouth daily.  05/15/18  Yes Mahala Menghini, PA-C  propranolol (INDERAL) 80 MG tablet Take 1 tablet (80 mg total) by mouth 2 (two) times daily. 10/30/17  Yes Raylene Everts, MD  tiZANidine (ZANAFLEX) 2 MG tablet Take 1-2 tablets by mouth every 8 (eight) hours as needed. 04/14/19  Yes [provider]  traMADol (ULTRAM) 50 MG tablet Take 50-100 mg by mouth every 6 (six) hours as needed.   Yes [provider]  traZODone (DESYREL) 50 MG tablet Take 25 mg by mouth at bedtime as needed for sleep.    Yes [provider]  albuterol (PROVENTIL) (2.5 MG/3ML) 0.083% nebulizer solution INHALE 1 VIAL VIA NEBULIZER EVERY 6 HOURS AS NEEDED FOR WHEEZING AND SHORTNESS OF BREATH 12/25/16   Soyla Dryer, PA-C    Allergies as of 01/29/2020 - Review Complete 01/29/2020  Allergen Reaction Noted  . Asa [aspirin] Other (See Comments) 11/11/2008  . Ibuprofen  05/23/2019    Family History  Problem Relation Age of Onset  . Heart failure Father   . Hypertension Father   . Stroke Father   . Colon cancer Father        greater than age 68  . Cancer Father   . Dementia Father   . Cancer Mother   . Seizures Sister     Social History   Socioeconomic History  . Marital status: Married    Spouse name: Cheno  . Number of children: 1  . Years of education: Not on file  . Highest education level: Not on file  Occupational History  . Occupation: disability    Comment: lung disease / COPD  Tobacco Use  . Smoking status: Current Every Day Smoker    Packs/day: 0.25    Years: 42.00    Pack years: 10.50    Types: Cigarettes    Start date: 09/29/1972  . Smokeless tobacco: Never Used  . Tobacco comment: about 2 cigs per day  Vaping Use  . Vaping Use: Never used  Substance and Sexual Activity  . Alcohol use: No    Alcohol/week: 0.0 standard drinks  . Drug use: No    Types: "Crack"  cocaine    Comment: hx of smoking crack cocaine last 5-6 years ago  . Sexual activity: Never    Birth control/protection: None  Other Topics Concern  . Not on file  Social History Narrative   Married to [employer] for 28 years   Keep her sister - she has seizures/disabled   Social Determinants of Radio broadcast assistant Strain:   . Difficulty of Paying Living Expenses:   Food Insecurity:   . Worried About Charity fundraiser in the Last  Year:   . Ran Out of Food in the Last Year:   Transportation Needs:   . Film/video editor (Medical):   Marland Kitchen Lack of Transportation (Non-Medical):   Physical Activity:   . Days of Exercise per Week:   . Minutes of Exercise per Session:   Stress:   . Feeling of Stress :   Social Connections:   . Frequency of Communication with Friends and Family:   . Frequency of Social Gatherings with Friends and Family:   . Attends Religious Services:   . Active Member of Clubs or Organizations:   . Attends Archivist Meetings:   Marland Kitchen Marital Status:   Intimate Partner Violence:   . Fear of Current or Ex-Partner:   . Emotionally Abused:   Marland Kitchen Physically Abused:   . Sexually Abused:     Review of Systems: See HPI, otherwise negative ROS  Impression/Plan: Cynthia [name] is here for a EGD to be performed for decreased appetite and weight loss  Risks, benefits, limitations, imponderables and alternatives regarding colonoscopy have been reviewed with the patient. Questions have been answered. All parties agreeable.

## 2020-02-24 NOTE — Anesthesia Preprocedure Evaluation (Signed)
Anesthesia Evaluation  Patient identified by MRN, date of birth, ID band Patient awake    Reviewed: Allergy & Precautions, NPO status , Patient's Chart, lab work & pertinent test results  History of Anesthesia Complications Negative for: history of anesthetic complications  Airway Mallampati: II  TM Distance: >3 FB Neck ROM: Full    Dental  (+) Edentulous Upper, Edentulous Lower   Pulmonary pneumonia, COPD,  COPD inhaler, Current SmokerPatient did not abstain from smoking.,    Pulmonary exam normal breath sounds clear to auscultation       Cardiovascular hypertension, Pt. on medications + angina + Peripheral Vascular Disease  Normal cardiovascular exam Rhythm:Regular Rate:Normal  23-Feb-2020 09:10:24 [facility] ROUTINE RECORD Normal sinus rhythm Biatrial enlargement Nonspecific T wave abnormality Abnormal ECG Confirmed by Asencion Noble [phone]) on 02/23/2020 7:47:08 PM   Neuro/Psych  Headaches, PSYCHIATRIC DISORDERS Anxiety Depression  Neuromuscular disease    GI/Hepatic GERD  Medicated and Controlled,(+)     substance abuse  , Hepatitis -, C  Endo/Other    Renal/GU      Musculoskeletal  (+) Arthritis ,   Abdominal   Peds  Hematology  (+) anemia ,   Anesthesia Other Findings   Reproductive/Obstetrics                           Anesthesia Physical Anesthesia Plan  ASA: III  Anesthesia Plan: General   Post-op Pain Management:    Induction: Intravenous  PONV Risk Score and Plan: TIVA  Airway Management Planned: Nasal Cannula, Natural Airway and Simple Face Mask  Additional Equipment:   Intra-op Plan:   Post-operative Plan:   Informed Consent: I have reviewed the patients History and Physical, chart, labs and discussed the procedure including the risks, benefits and alternatives for the proposed anesthesia with the patient or authorized representative who has  indicated his/her understanding and acceptance.       Plan Discussed with: CRNA and Surgeon  Anesthesia Plan Comments:       Anesthesia Quick Evaluation

## 2020-02-26 ENCOUNTER — Other Ambulatory Visit: Payer: Self-pay

## 2020-02-26 LAB — SURGICAL PATHOLOGY

## 2020-02-27 ENCOUNTER — Encounter (HOSPITAL_COMMUNITY): Payer: Self-pay | Admitting: Internal Medicine

## 2020-03-01 DIAGNOSIS — Z720 Tobacco use: Secondary | ICD-10-CM | POA: Diagnosis not present

## 2020-03-01 DIAGNOSIS — J449 Chronic obstructive pulmonary disease, unspecified: Secondary | ICD-10-CM | POA: Diagnosis not present

## 2020-03-01 DIAGNOSIS — E782 Mixed hyperlipidemia: Secondary | ICD-10-CM | POA: Diagnosis not present

## 2020-03-01 DIAGNOSIS — I10 Essential (primary) hypertension: Secondary | ICD-10-CM | POA: Diagnosis not present

## 2020-03-01 DIAGNOSIS — D509 Iron deficiency anemia, unspecified: Secondary | ICD-10-CM | POA: Diagnosis not present

## 2020-03-10 DIAGNOSIS — I10 Essential (primary) hypertension: Secondary | ICD-10-CM | POA: Diagnosis not present

## 2020-03-10 DIAGNOSIS — E782 Mixed hyperlipidemia: Secondary | ICD-10-CM | POA: Diagnosis not present

## 2020-03-10 DIAGNOSIS — D509 Iron deficiency anemia, unspecified: Secondary | ICD-10-CM | POA: Diagnosis not present

## 2020-03-10 DIAGNOSIS — J449 Chronic obstructive pulmonary disease, unspecified: Secondary | ICD-10-CM | POA: Diagnosis not present

## 2020-03-10 DIAGNOSIS — R944 Abnormal results of kidney function studies: Secondary | ICD-10-CM | POA: Diagnosis not present

## 2020-03-18 DIAGNOSIS — M533 Sacrococcygeal disorders, not elsewhere classified: Secondary | ICD-10-CM | POA: Diagnosis not present

## 2020-03-18 DIAGNOSIS — I10 Essential (primary) hypertension: Secondary | ICD-10-CM | POA: Diagnosis not present

## 2020-03-18 DIAGNOSIS — M542 Cervicalgia: Secondary | ICD-10-CM | POA: Diagnosis not present

## 2020-03-18 DIAGNOSIS — M545 Low back pain: Secondary | ICD-10-CM | POA: Diagnosis not present

## 2020-03-18 DIAGNOSIS — M25551 Pain in right hip: Secondary | ICD-10-CM | POA: Diagnosis not present

## 2020-03-29 DIAGNOSIS — J4540 Moderate persistent asthma, uncomplicated: Secondary | ICD-10-CM | POA: Diagnosis not present

## 2020-04-09 DIAGNOSIS — M533 Sacrococcygeal disorders, not elsewhere classified: Secondary | ICD-10-CM | POA: Diagnosis not present

## 2020-04-09 DIAGNOSIS — M545 Low back pain, unspecified: Secondary | ICD-10-CM | POA: Diagnosis not present

## 2020-04-20 ENCOUNTER — Encounter: Payer: Self-pay | Admitting: Cardiology

## 2020-04-20 DIAGNOSIS — M79671 Pain in right foot: Secondary | ICD-10-CM | POA: Diagnosis not present

## 2020-04-20 DIAGNOSIS — M79604 Pain in right leg: Secondary | ICD-10-CM | POA: Diagnosis not present

## 2020-04-20 DIAGNOSIS — L84 Corns and callosities: Secondary | ICD-10-CM | POA: Diagnosis not present

## 2020-04-20 DIAGNOSIS — E559 Vitamin D deficiency, unspecified: Secondary | ICD-10-CM | POA: Diagnosis not present

## 2020-04-20 DIAGNOSIS — R799 Abnormal finding of blood chemistry, unspecified: Secondary | ICD-10-CM | POA: Diagnosis not present

## 2020-04-20 DIAGNOSIS — D509 Iron deficiency anemia, unspecified: Secondary | ICD-10-CM | POA: Diagnosis not present

## 2020-04-20 DIAGNOSIS — Z712 Person consulting for explanation of examination or test findings: Secondary | ICD-10-CM | POA: Diagnosis not present

## 2020-04-20 DIAGNOSIS — E43 Unspecified severe protein-calorie malnutrition: Secondary | ICD-10-CM | POA: Diagnosis not present

## 2020-04-21 DIAGNOSIS — K219 Gastro-esophageal reflux disease without esophagitis: Secondary | ICD-10-CM | POA: Diagnosis not present

## 2020-04-21 DIAGNOSIS — J449 Chronic obstructive pulmonary disease, unspecified: Secondary | ICD-10-CM | POA: Diagnosis not present

## 2020-04-21 DIAGNOSIS — K5900 Constipation, unspecified: Secondary | ICD-10-CM | POA: Diagnosis not present

## 2020-04-26 ENCOUNTER — Ambulatory Visit: Payer: Medicare Other | Admitting: Gastroenterology

## 2020-04-29 DIAGNOSIS — R7401 Elevation of levels of liver transaminase levels: Secondary | ICD-10-CM | POA: Diagnosis not present

## 2020-04-29 DIAGNOSIS — K219 Gastro-esophageal reflux disease without esophagitis: Secondary | ICD-10-CM | POA: Diagnosis not present

## 2020-04-29 DIAGNOSIS — R1010 Upper abdominal pain, unspecified: Secondary | ICD-10-CM | POA: Diagnosis not present

## 2020-04-29 DIAGNOSIS — K5900 Constipation, unspecified: Secondary | ICD-10-CM | POA: Diagnosis not present

## 2020-04-29 DIAGNOSIS — J449 Chronic obstructive pulmonary disease, unspecified: Secondary | ICD-10-CM | POA: Diagnosis not present

## 2020-05-06 ENCOUNTER — Emergency Department (HOSPITAL_COMMUNITY): Payer: Medicare Other

## 2020-05-06 ENCOUNTER — Encounter (HOSPITAL_COMMUNITY): Payer: Self-pay

## 2020-05-06 ENCOUNTER — Emergency Department (HOSPITAL_COMMUNITY)
Admission: EM | Admit: 2020-05-06 | Discharge: 2020-05-06 | Disposition: A | Discharge status: Home / Self Care | Payer: Medicare Other | Attending: Emergency Medicine | Admitting: Emergency Medicine

## 2020-05-06 ENCOUNTER — Other Ambulatory Visit: Payer: Self-pay

## 2020-05-06 DIAGNOSIS — I10 Essential (primary) hypertension: Secondary | ICD-10-CM | POA: Insufficient documentation

## 2020-05-06 DIAGNOSIS — M25551 Pain in right hip: Secondary | ICD-10-CM | POA: Insufficient documentation

## 2020-05-06 DIAGNOSIS — J449 Chronic obstructive pulmonary disease, unspecified: Secondary | ICD-10-CM | POA: Insufficient documentation

## 2020-05-06 DIAGNOSIS — M545 Low back pain, unspecified: Secondary | ICD-10-CM | POA: Insufficient documentation

## 2020-05-06 DIAGNOSIS — W19XXXA Unspecified fall, initial encounter: Secondary | ICD-10-CM

## 2020-05-06 DIAGNOSIS — Z79899 Other long term (current) drug therapy: Secondary | ICD-10-CM | POA: Insufficient documentation

## 2020-05-06 DIAGNOSIS — F17210 Nicotine dependence, cigarettes, uncomplicated: Secondary | ICD-10-CM | POA: Insufficient documentation

## 2020-05-06 DIAGNOSIS — W182XXA Fall in (into) shower or empty bathtub, initial encounter: Secondary | ICD-10-CM | POA: Insufficient documentation

## 2020-05-06 NOTE — ED Triage Notes (Signed)
Pt reports slipped in the tub Monday and c/o pain in r lower back.  Denies hitting head.

## 2020-05-06 NOTE — ED Provider Notes (Addendum)
[facility] EMERGENCY DEPARTMENT Provider Note   CSN: [id number] Arrival date & time: 05/06/20  1696     History Chief Complaint  Patient presents with  . Fall    Cynthia [name] [name] is a 69 y.o. female.  Patient had a fall on Monday in the tub.  She did not hit her head no loss of consciousness.  Patient's had pain in the right hip area and the right side of the lumbar back since that fall.  Is been painful to weight-bear.        Past Medical History:  Diagnosis Date  . Allergy   . Anemia   . Angina pectoris ([place])   . Anxiety   . Arthritis   . Bronchitis   . Bronchitis   . COPD (chronic obstructive pulmonary disease) ([place])   . Depression   . GERD (gastroesophageal reflux disease)   . HCV (hepatitis C virus)    2018 COMPLETE VIROLOGIC RESPONSE  . High blood cholesterol level   . Hypertension   . Migraine   . Migraines   . Neuromuscular disorder ([place])   . Pneumonia   . PTSD (post-traumatic stress disorder)   . 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 Elevated LFTs 01/29/2020  . Loss of weight 01/29/2020  . Early satiety 01/29/2020  . Loss of appetite 01/29/2020  . Somnolence, daytime 06/13/2017  . Tobacco abuse 05/02/2017  . Substance abuse in remission ([place]) 05/02/2017  . 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 Atherosclerotic peripheral vascular disease with intermittent claudication ([place]) 01/25/2017  . GERD (gastroesophageal reflux disease) 10/13/2015  . Chronic obstructive pulmonary disease ([place]) 07/14/2015  . Obstructive chronic bronchitis without exacerbation ([place]) 04/13/2015  . Headache, tension type, chronic 04/13/2015  . Anxiety state 02/27/2015  . FH: colon cancer 01/15/2015  . Hepatitis C virus infection resolved after antiviral drug therapy 01/15/2015  . Hyperlipidemia 11/26/2008  . UNSPECIFIED ANEMIA 11/26/2008  . MIGRAINE HEADACHE 11/11/2008  . Essential  hypertension, benign 11/11/2008  . Osteoarthritis of hands, bilateral 11/11/2008    Past Surgical History:  Procedure Laterality Date  . BIOPSY  02/09/2015   Procedure: GASTRIC BIOPSIES ;  Surgeon: Danie Binder, MD;  Location: [facility];  Service: Endoscopy;;  . BIOPSY  02/24/2020   Procedure: BIOPSY;  Surgeon: Eloise Harman, DO;  Location: [facility];  Service: Endoscopy;;  . COLONOSCOPY WITH PROPOFOL N/A 02/09/2015   SLF: 1. the examined terminal ileum appeared to be normal 2. the colonic mucosa appeared normal 3. small internal hemorrhoids  . ESOPHAGOGASTRODUODENOSCOPY (EGD) WITH PROPOFOL N/A 02/09/2015   SLF: 1. stricture at the gastroesophageal junction 2. mild non-erosive gastritis   . ESOPHAGOGASTRODUODENOSCOPY (EGD) WITH PROPOFOL N/A 02/24/2020   Procedure: ESOPHAGOGASTRODUODENOSCOPY (EGD) WITH PROPOFOL;  Surgeon: Eloise Harman, DO;  Location: [facility];  Service: Endoscopy;  Laterality: N/A;  2:15pm  . GIVENS CAPSULE STUDY N/A 10/19/2016   Procedure: GIVENS CAPSULE STUDY;  Surgeon: Danie Binder, MD;  Location[facility];  Service: Endoscopy;  Laterality: N/A;  7:30am, pt to arrive at 7:00am  . left arm     ? nerve repaired, " it was pinched".  . left elbow surgery Left   . SAVORY DILATION N/A 02/09/2015   Procedure: SAVORY DILATION 12.65mm, 35mm, 76mm, 6mm;  Surgeon: Danie Binder, MD;  Location: [facility];  Service: Endoscopy;  Laterality: N/A;  . TONSILLECTOMY    .  TUBAL LIGATION       OB History    Gravida  2   Para  1   Term      Preterm      AB  1   Living        SAB      TAB      Ectopic      Multiple      Live Births              Family History  Problem Relation Age of Onset  . Heart failure Father   . Hypertension Father   . Stroke Father   . Colon cancer Father        greater than age 6  . Cancer Father   . Dementia Father   . Cancer Mother   . Seizures Sister     Social History   Tobacco Use  . Smoking status: Current  Every Day Smoker    Packs/day: 0.25    Years: 42.00    Pack years: 10.50    Types: Cigarettes    Start date: 09/29/1972  . Smokeless tobacco: Never Used  . Tobacco comment: about 2 cigs per day  Vaping Use  . Vaping Use: Never used  Substance Use Topics  . Alcohol use: No    Alcohol/week: 0.0 standard drinks  . Drug use: Not Currently    Types: "Crack" cocaine    Comment: hx of smoking crack cocaine last 5-6 years ago    Home Medications Prior to Admission medications   Medication Sig Start Date End Date Taking? Authorizing Provider  albuterol (PROVENTIL) (2.5 MG/3ML) 0.083% nebulizer solution INHALE 1 VIAL VIA NEBULIZER EVERY 6 HOURS AS NEEDED FOR WHEEZING AND SHORTNESS OF BREATH 12/25/16   Soyla Dryer, PA-C  albuterol (VENTOLIN HFA) 108 (90 Base) MCG/ACT inhaler Inhale 1 puff into the lungs every 6 (six) hours as needed for wheezing or shortness of breath.  01/06/19   [provider]  amLODipine (NORVASC) 10 MG tablet Take 10 mg by mouth daily.    [provider]  Ascorbic Acid (VITAMIN C PO) Take 1 tablet by mouth daily.    [provider]  atorvastatin (LIPITOR) 40 MG tablet Take 40 mg by mouth daily.  04/09/19   [provider]  butalbital-acetaminophen-caffeine (FIORICET) 50-325-40 MG tablet Take 1 tablet by mouth 2 (two) times daily as needed for headache. 04/18/19   [provider]  Calcium-Magnesium-Zinc (CAL-MAG-ZINC PO) Take 1 tablet by mouth daily.     [provider]  FLUoxetine (PROZAC) 40 MG capsule Take 40 mg by mouth daily.  08/13/17   [provider]  furosemide (LASIX) 20 MG tablet TAKE ONE TABLET BY mouth daily Patient taking differently: Take 20 mg by mouth daily. TAKE ONE TABLET BY mouth daily 08/27/17   Raylene Everts, MD  gabapentin (NEURONTIN) 100 MG capsule Take 100 mg by mouth at bedtime as needed (pain).  01/29/20   [provider]  gabapentin (NEURONTIN) 300 MG capsule Take 300 mg by  mouth at bedtime as needed (pain).     [provider]  LORazepam (ATIVAN) 0.5 MG tablet Take 0.5 mg by mouth at bedtime as needed for anxiety.     [provider]  meloxicam (MOBIC) 15 MG tablet Take 1 tablet (15 mg total) by mouth daily. Patient taking differently: Take 15 mg by mouth daily as needed.  08/13/17   Raylene Everts, MD  mirtazapine ([place]) 15  MG tablet Take 15 mg by mouth at bedtime.    [provider]  Multiple Vitamin (MULTIVITAMIN) tablet Take 1 tablet by mouth daily.    [provider]  nitroGLYCERIN (NITROSTAT) 0.4 MG SL tablet Place 0.4 mg under the tongue every 5 (five) minutes as needed for chest pain.    [provider]  omeprazole (PRILOSEC) 20 MG capsule TAKE 1 CAPSULE BY MOUTH DAILY BEFORE A MEAL. Patient taking differently: Take 20 mg by mouth daily.  05/15/18   Mahala Menghini, PA-C  propranolol (INDERAL) 80 MG tablet Take 1 tablet (80 mg total) by mouth 2 (two) times daily. 10/30/17   Raylene Everts, MD  tiZANidine (ZANAFLEX) 2 MG tablet Take 1-2 tablets by mouth every 8 (eight) hours as needed. 04/14/19   [provider]  traMADol (ULTRAM) 50 MG tablet Take 50-100 mg by mouth every 6 (six) hours as needed.    [provider]  traZODone (DESYREL) 50 MG tablet Take 25 mg by mouth at bedtime as needed for sleep.     [provider]    Allergies    Asa [aspirin] and Ibuprofen  Review of Systems   Review of Systems  Constitutional: Negative for chills and fever.  HENT: Negative for rhinorrhea and sore throat.   Eyes: Negative for visual disturbance.  Respiratory: Negative for cough and shortness of breath.   Cardiovascular: Negative for chest pain and leg swelling.  Gastrointestinal: Negative for abdominal pain, diarrhea, nausea and vomiting.  Genitourinary: Negative for dysuria.  Musculoskeletal: Positive for back pain. Negative for neck pain.  Skin: Negative for rash.  Neurological:  Negative for dizziness, light-headedness and headaches.  Hematological: Does not bruise/bleed easily.  Psychiatric/Behavioral: Negative for confusion.    Physical Exam Updated Vital Signs BP 139/73 (BP Location: Left Arm)   Pulse 86   Temp 98 F (36.7 C) (Oral)   Resp 16   Ht 1.626 m (5\' 4" )   Wt 54.4 kg   SpO2 95%   BMI 20.60 kg/m   Physical Exam Vitals and nursing note reviewed.  Constitutional:      General: She is not in acute distress.    Appearance: She is well-developed.  HENT:     Head: Normocephalic and atraumatic.  Eyes:     Extraocular Movements: Extraocular movements intact.     Conjunctiva/sclera: Conjunctivae normal.     Pupils: Pupils are equal, round, and reactive to light.  Cardiovascular:     Rate and Rhythm: Normal rate and regular rhythm.     Heart sounds: No murmur heard.   Pulmonary:     Effort: Pulmonary effort is normal. No respiratory distress.     Breath sounds: Normal breath sounds.  Abdominal:     Palpations: Abdomen is soft.     Tenderness: There is no abdominal tenderness.  Musculoskeletal:        General: Tenderness and signs of injury present. No swelling or deformity.     Cervical back: Normal range of motion and neck supple. No rigidity or tenderness.     Comments: Pain with range of motion at the right hip..  Some tenderness to the posterior aspect of the pelvis with palpation.  Skin:    General: Skin is warm and dry.  Neurological:     General: No focal deficit present.     Mental Status: She is alert and oriented to person, place, and time.     Cranial Nerves: No cranial nerve deficit.  Sensory: No sensory deficit.     Motor: No weakness.     ED Results / Procedures / Treatments   Labs (all labs ordered are listed, but only abnormal results are displayed) Labs Reviewed - No data to display  EKG None  Radiology DG Lumbar Spine Complete  Result Date: 05/06/2020 CLINICAL DATA:  Fall.  Low back pain radiates to  right hip. EXAM: LUMBAR SPINE - COMPLETE 4+ VIEW COMPARISON:  None. FINDINGS: No evidence for an acute fracture. Marked loss of disc height with endplate degeneration noted L3-4 and L4-5. Mild facet degeneration noted on the right at L4-5. SI joints unremarkable. IMPRESSION: Lower lumbar degenerative changes without acute bony abnormality. Electronically Signed   By: Misty Stanley M.D.   On: 05/06/2020 08:44   CT Hip Right Wo Contrast  Result Date: 05/06/2020 CLINICAL DATA:  Right hip pain since a slip and fall in water on 05/03/2020. Initial encounter. EXAM: CT OF THE RIGHT HIP WITHOUT CONTRAST TECHNIQUE: Multidetector CT imaging of the right hip was performed according to the standard protocol. Multiplanar CT image reconstructions were also generated. COMPARISON:  Plain films right hip today. FINDINGS: Bones/Joint/Cartilage There is no fracture or dislocation. No worrisome bony lesion is identified. Minimal degenerative change is present about the right hip. No avascular necrosis of the femoral head. Small synovial herniation pit at the head neck junction of the hip is noted. No joint effusion. Ligaments Suboptimally assessed by CT. Muscles and Tendons Intact and normal in appearance. Soft tissues Atherosclerosis noted. IMPRESSION: No acute abnormality. Very mild appearing right hip degenerative disease. Electronically Signed   By: Inge Rise M.D.   On: 05/06/2020 09:52   DG Hip Unilat With Pelvis 2-3 Views Right  Result Date: 05/06/2020 CLINICAL DATA:  Right hip pain after fall. EXAM: DG HIP (WITH OR WITHOUT PELVIS) 2-3V RIGHT COMPARISON:  February 02, 2020. FINDINGS: There is no evidence of hip fracture or dislocation. There is no evidence of arthropathy or other focal bone abnormality. IMPRESSION: Negative. Electronically Signed   By: Marijo Conception M.D.   On: 05/06/2020 08:45    Procedures Procedures (including critical care time)  Medications Ordered in ED Medications - No data to  display  ED Course  I have reviewed the triage vital signs and the nursing notes.  Pertinent labs & imaging results that were available during my care of the patient were reviewed by me and considered in my medical decision making (see chart for details).    MDM Rules/Calculators/A&P                          X-ray of lumbar area and pelvis and right hip without any bony abnormalities.  Presents patient has discomfort with weightbearing on the right leg we will going get CT scan of the hip to rule out any hairline fracture.  CT scan of the hip but without any bony abnormalities.  Patient just with some mild arthritic changes in the right hip I do not feel that that is the cause of her pain I think it is more of a right hip contusion from the fall.  We will have patient follow-up with orthopedics.  Patient states she does have tramadol available at home to take for pain.    Final Clinical Impression(s) / ED Diagnoses Final diagnoses:  Fall, initial encounter  Hip pain, acute, right    Rx / DC Orders ED Discharge Orders    None  Fredia Sorrow, MD 05/06/20 0354    Fredia Sorrow, MD 05/06/20 1041

## 2020-05-06 NOTE — Discharge Instructions (Signed)
Work-up for the hip pain following the fall without evidence of any bony fracture or injury.  Still can have pain from muscular standpoint.  Recommend following up with orthopedics referral information provided above.  It is okay to walk and to weight-bear on the right leg.  Restart taking the tramadol that she have at home.

## 2020-05-13 DIAGNOSIS — G4489 Other headache syndrome: Secondary | ICD-10-CM | POA: Diagnosis not present

## 2020-05-13 DIAGNOSIS — M25551 Pain in right hip: Secondary | ICD-10-CM | POA: Diagnosis not present

## 2020-05-13 DIAGNOSIS — I10 Essential (primary) hypertension: Secondary | ICD-10-CM | POA: Diagnosis not present

## 2020-05-13 DIAGNOSIS — G4700 Insomnia, unspecified: Secondary | ICD-10-CM | POA: Diagnosis not present

## 2020-05-13 DIAGNOSIS — M545 Low back pain, unspecified: Secondary | ICD-10-CM | POA: Diagnosis not present

## 2020-05-24 DIAGNOSIS — K219 Gastro-esophageal reflux disease without esophagitis: Secondary | ICD-10-CM | POA: Diagnosis not present

## 2020-05-24 DIAGNOSIS — J449 Chronic obstructive pulmonary disease, unspecified: Secondary | ICD-10-CM | POA: Diagnosis not present

## 2020-05-24 DIAGNOSIS — K5900 Constipation, unspecified: Secondary | ICD-10-CM | POA: Diagnosis not present

## 2020-05-27 NOTE — Progress Notes (Signed)
Referring Provider: Celene Squibb, MD Primary Care Physician:  Celene Squibb, MD Primary GI Physician: Dr. Abbey Chatters  Chief Complaint  Patient presents with  . Abdominal Pain    Rt side x 1 month. hurts off and on     HPI:   Cynthia [name] is a 69 y.o. female presenting today for follow-up of early satiety, loss of appetite, weight loss, elevated LFTs, as well as concern for abdominal pain.    GI history of GERD, hep C s/p treatment with Harvoni in 2017, F2/F3 on ultrasound with elastography prior to hep C treatment, repeat elastography in February 2018 with F0/F1.  H. pylori in August 2016 with eradication confirmed in August 2017.  Chronic history of anemia.  Colonoscopy August 2016 for weight loss, rectal bleeding, constipation with normal exam other than internal hemorrhoids with recommendations to repeat in 2026.  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 Pylori).  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  She was last seen in our office 01/29/2020.  CT A/P with contrast in March 2021 with no findings to explain abdominal pain or weight loss. Mild lymphadenopathy in the hepatoduodenal ligament slightly increased in interval but likely reactive. Recommend follow-up CT in 3 months to ensure stability.  Prior labs in May remarkable for hemoglobin 10.9 (L), Alk phos 675 (H), AST 218 (H), ALT 108 (H).  Total bilirubin within normal limits.  Ferritin 1097 (H), iron saturation 60% (H), iron 138.  At the time of her OV, she had documented 22 pound weight loss between April 2020-May 2021, but gained 4 pounds back in the last 2 months.  Noted decreased appetite and early satiety with fairly little intake which was  felt to be heavily influenced by significant anxiety/depression due to taking care of her sister and having a lot of trouble with her husband at home.  [facility] was [facility].  Denied N/V, GERD symptoms on omeprazole, abdominal pain, constipation, diarrhea, BRBPR, or melena.  Taking Fioricet and meloxicam.  Was also taking Marinol every other day.  Had taken it daily but noticed no difference.  No risk factors for reinfection of hep C.  No alcohol.  No signs or symptoms of decompensated liver disease.  Plan included CBC, CMP, INR, hepatitis B and C serologies, hemochromatosis DNA, ultrasound abdomen complete, EGD, eat 4-6 small meals daily and drink 3 Ensure daily, follow-up with PCP and [facility] regarding anxiety/depression.  Abdominal ultrasound: No gallstones or wall thickening, CBD within normal limits, parenchymal echogenicity normal without focal lesion.  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  Repeat iron panel 02/23/2020: Ferritin 58, iron 101, saturation 37%  EGD 02/24/2020: Gastritis s/p biopsy (nonspecific reactive gastropathy), normal examined duodenum s/p biopsy (benign).  Today:  Abdominal Pain: Intermittent right sided abdominal pain x1 month. Pain is in the right lower quadrant. Also states she has been having trouble with her right leg. When her right leg hurts, her RLQ hurts as well. Has bad arthritis. Has severe hip and back pain. No pain currently.  Occurs 6  times a week.  orse when he is up moving around and staying on her feet too long. Not associated with eating or bowel movements. Following with orthopedics. Taking tramadol and gabapentin. Meloxicam every now and then. She has also had injections in her lower back. No constipation. Bowels are moving well. No brbpr or  melena. No urinary symptoms.   Weight loss/early satiety/decreased appetite: Gained 8 lbs in the last 4 months. Feels she is eating better. Early satiety has resolved. Family stress is improving. No nausea or vomiting. No GERD symptoms on omeprazole 20 mg daily. No dysphagia.   Elevated LFTs:  No tylenol. No OTC supplements. No alcohol.  No illicit drug use.  History of autoimmune conditions: No known personal history of autoimmune conditions.  No family history of autoimmune conditions, IBD, Crohns, celiac disease, or thyroid abnormalities.  No swelling in her abdomen or lower extremities, confusion or change in mental status, yellowing of the eyes, bruising or bleeding.  Past Medical History:  Diagnosis Date  . Allergy   . Anemia   . Angina pectoris ([place])   . Anxiety   . Arthritis   . Bronchitis   . Bronchitis   . COPD (chronic obstructive pulmonary disease) ([place])   . Depression   . GERD (gastroesophageal reflux disease)   . HCV (hepatitis C virus)    2018 COMPLETE VIROLOGIC RESPONSE  . High blood cholesterol level   . Hypertension   . Migraine   . Migraines   . Neuromuscular disorder ([place])   . Pneumonia   . PTSD (post-traumatic stress disorder)   . 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 BIOPSY  02/09/2015   Procedure: GASTRIC BIOPSIES ;  Surgeon: Danie Binder, MD;  Location: [facility];  Service: Endoscopy;;  . BIOPSY  02/24/2020   Procedure: BIOPSY;  Surgeon: Eloise Harman, DO;  Location: [facility];  Service: Endoscopy;;  . COLONOSCOPY WITH PROPOFOL N/A 02/09/2015   SLF: 1. the examined terminal ileum appeared to be normal 2. the colonic mucosa appeared normal 3. small internal hemorrhoids  . ESOPHAGOGASTRODUODENOSCOPY (EGD) WITH PROPOFOL N/A 02/09/2015   SLF: 1. stricture at the gastroesophageal junction 2. mild non-erosive gastritis   . ESOPHAGOGASTRODUODENOSCOPY (EGD) WITH PROPOFOL N/A 02/24/2020    Procedure: ESOPHAGOGASTRODUODENOSCOPY (EGD) WITH PROPOFOL;  Surgeon: Eloise Harman, DO;  Gastritis s/p biopsy (nonspecific reactive gastropathy), normal examined duodenum s/p biopsy (benign).  Marland Kitchen GIVENS CAPSULE STUDY N/A 10/19/2016   Procedure: GIVENS CAPSULE STUDY;  Surgeon: Danie Binder, MD;  Location: [facility];  Service: Endoscopy;  Laterality: N/A;  7:30am, pt to arrive at 7:00am  . left arm     ? nerve repaired, " it was pinched".  . left elbow surgery Left   . SAVORY DILATION N/A 02/09/2015   Procedure: SAVORY DILATION 12.51mm, 55mm, 21mm, 83mm;  Surgeon: Danie Binder, MD;  Location: [facility];  Service: Endoscopy;  Laterality: N/A;  . TONSILLECTOMY    . TUBAL LIGATION      Current Outpatient Medications  Medication Sig Dispense Refill  . albuterol (PROVENTIL) (2.5 MG/3ML) 0.083% nebulizer solution INHALE 1 VIAL VIA NEBULIZER EVERY 6 HOURS AS NEEDED FOR WHEEZING AND SHORTNESS OF BREATH 150 mL 1  . albuterol (VENTOLIN HFA) 108 (90 Base) MCG/ACT inhaler Inhale 1 puff into the lungs every 6 (six) hours as needed for wheezing or shortness of breath.     Marland Kitchen amLODipine (NORVASC) 10 MG  tablet Take 10 mg by mouth daily.    . Ascorbic Acid (VITAMIN C PO) Take 1 tablet by mouth daily.    Marland Kitchen atorvastatin (LIPITOR) 40 MG tablet Take 40 mg by mouth daily.     . busPIRone (BUSPAR) 5 MG tablet 3 (three) times daily as needed.    . Calcium-Magnesium-Zinc (CAL-MAG-ZINC PO) Take 1 tablet by mouth daily.     Marland Kitchen FLUoxetine (PROZAC) 40 MG capsule Take 40 mg by mouth daily.     . furosemide (LASIX) 20 MG tablet TAKE ONE TABLET BY mouth daily (Patient taking differently: TAKE ONE TABLET BY mouth daily prn) 90 tablet 0  . gabapentin (NEURONTIN) 100 MG capsule Take 100 mg by mouth at bedtime as needed (pain).     Marland Kitchen gabapentin (NEURONTIN) 300 MG capsule Take 300 mg by mouth at bedtime as needed (pain).     . meloxicam (MOBIC) 15 MG tablet Take 1 tablet (15 mg total) by mouth daily. (Patient taking  differently: Take 15 mg by mouth daily as needed. ) 90 tablet 3  . mirtazapine (REMERON) 15 MG tablet Take 15 mg by mouth at bedtime.    . Multiple Vitamin (MULTIVITAMIN) tablet Take 1 tablet by mouth daily.    . nitroGLYCERIN (NITROSTAT) 0.4 MG SL tablet Place 0.4 mg under the tongue every 5 (five) minutes as needed for chest pain.    Marland Kitchen omeprazole (PRILOSEC) 20 MG capsule TAKE 1 CAPSULE BY MOUTH DAILY BEFORE A MEAL. (Patient taking differently: Take 20 mg by mouth daily. ) 90 capsule 3  . propranolol (INDERAL) 80 MG tablet Take 1 tablet (80 mg total) by mouth 2 (two) times daily. 180 tablet 1  . tiZANidine (ZANAFLEX) 2 MG tablet Take 1-2 tablets by mouth every 8 (eight) hours as needed.    . topiramate (TOPAMAX) 25 MG tablet at bedtime.    . traMADol (ULTRAM) 50 MG tablet Take 50-100 mg by mouth every 6 (six) hours as needed.    . traZODone (DESYREL) 50 MG tablet Take 25 mg by mouth at bedtime as needed for sleep.      No current facility-administered medications for this visit.    Allergies as of 05/28/2020 - Review Complete 05/28/2020  Allergen Reaction Noted  . Asa [aspirin] Other (See Comments) 11/11/2008  . Ibuprofen  05/23/2019    Family History  Problem Relation Age of Onset  . Heart failure Father   . Hypertension Father   . Stroke Father   . Colon cancer Father        greater than age 75  . Cancer Father   . Dementia Father   . Cancer Mother   . Seizures Sister   . Inflammatory bowel disease Neg Hx   . Liver disease Neg Hx     Social History   Socioeconomic History  . Marital status: Legally Separated    Spouse name: Cheno  . Number of children: 1  . Years of education: Not on file  . Highest education level: Not on file  Occupational History  . Occupation: disability    Comment: lung disease / COPD  Tobacco Use  . Smoking status: Current Every Day Smoker    Packs/day: 0.25    Years: 42.00    Pack years: 10.50    Types: Cigarettes    Start date: 09/29/1972   . Smokeless tobacco: Never Used  . Tobacco comment: about 2 cigs per day  Vaping Use  . Vaping Use: Never used  Substance and  Sexual Activity  . Alcohol use: No    Alcohol/week: 0.0 standard drinks  . Drug use: Not Currently    Types: "Crack" cocaine    Comment: hx of smoking crack cocaine last 5-6 years ago  . Sexual activity: Never    Birth control/protection: None  Other Topics Concern  . Not on file  Social History Narrative   Married to [employer] for 28 years   Keep her sister - she has seizures/disabled   Social Determinants of Radio broadcast assistant Strain:   . Difficulty of Paying Living Expenses: Not on file  Food Insecurity:   . Worried About Charity fundraiser in the Last Year: Not on file  . Ran Out of Food in the Last Year: Not on file  Transportation Needs:   . Lack of Transportation (Medical): Not on file  . Lack of Transportation (Non-Medical): Not on file  Physical Activity:   . Days of Exercise per Week: Not on file  . Minutes of Exercise per Session: Not on file  Stress:   . Feeling of Stress : Not on file  Social Connections:   . Frequency of Communication with Friends and Family: Not on file  . Frequency of Social Gatherings with Friends and Family: Not on file  . Attends Religious Services: Not on file  . Active Member of Clubs or Organizations: Not on file  . Attends Archivist Meetings: Not on file  . Marital Status: Not on file    Review of Systems: Gen: Denies fever, chills, cold or flulike symptoms, lightheadedness, dizziness, presyncope, syncope. CV: Has chest pain last Tuesday and had to use nitroglycerine. Feels this occurs when she gets really upset.  Following with cardiology. No palpitations Resp: Admits to dyspnea with exertion.  No regular cough.Marland Kitchen GI: See HPI Heme: See HPI  Physical Exam: BP 125/71   Pulse 71   Temp (!) 97 F (36.1 C) (Temporal)   Ht _0  (1.626 m)   Wt 125 lb (56.7 kg)   BMI 21.46 kg/m    General:   Alert and oriented. No distress noted. Pleasant and cooperative.  Head:  Normocephalic and atraumatic. Eyes:  Conjuctiva clear without scleral icterus. Heart:  S1, S2 present without murmurs appreciated. Lungs:  Clear to auscultation bilaterally. No wheezes, rales, or rhonchi. No distress.  Abdomen:  +BS, soft, and non-distended.  Mild tenderness to palpation in the right pelvic/lower abdominal region.  No rebound or guarding. No HSM or masses noted. Msk:  Symmetrical without gross deformities. Normal posture. Extremities:  Without edema. Neurologic:  Alert and  oriented x4 Psych:  Normal mood and affect.

## 2020-05-28 ENCOUNTER — Encounter: Payer: Self-pay | Admitting: Gastroenterology

## 2020-05-28 ENCOUNTER — Ambulatory Visit (INDEPENDENT_AMBULATORY_CARE_PROVIDER_SITE_OTHER): Payer: Medicare Other | Admitting: Gastroenterology

## 2020-05-28 ENCOUNTER — Other Ambulatory Visit: Payer: Self-pay

## 2020-05-28 ENCOUNTER — Encounter: Payer: Self-pay | Admitting: *Deleted

## 2020-05-28 VITALS — BP 125/71 | HR 71 | Temp 97.0°F | Ht 64.0 in | Wt 125.0 lb

## 2020-05-28 DIAGNOSIS — R634 Abnormal weight loss: Secondary | ICD-10-CM | POA: Diagnosis not present

## 2020-05-28 DIAGNOSIS — R1031 Right lower quadrant pain: Secondary | ICD-10-CM | POA: Diagnosis not present

## 2020-05-28 DIAGNOSIS — R7989 Other specified abnormal findings of blood chemistry: Secondary | ICD-10-CM | POA: Diagnosis not present

## 2020-05-28 DIAGNOSIS — R109 Unspecified abdominal pain: Secondary | ICD-10-CM | POA: Insufficient documentation

## 2020-05-28 DIAGNOSIS — R935 Abnormal findings on diagnostic imaging of other abdominal regions, including retroperitoneum: Secondary | ICD-10-CM | POA: Diagnosis not present

## 2020-05-28 NOTE — Assessment & Plan Note (Addendum)
69 year old female reporting intermittent very low right-sided abdominal pain/pelvic pain for the last month.  Symptoms seem to be most closely associated with pain radiating from her right hip down her leg.  She is seeing an orthopedic for this and has been placed on tramadol and gabapentin which helps.  Denies any association with bowel movements or meals.  No fever or chills.   I suspect this is likely musculoskeletal/radicular nerve pain.  On exam, she does have mild reproducible right-sided pelvic/very low abdominal tenderness palpation.  Appendix in situ, but doubt appendicitis.  No urinary symptoms.  Notably, she had been losing weight over the last year, but this has resolved, and she has gained 8 pounds in the last 4 months.  P prior CT in March 2021 with mild lymphadenopathy in the hepatoduodenal ligament slightly increased in interval suspected to be reactive with recommendations to repeat CT in 3 months to ensure stability.  This CT has not been arranged.  Had considered an ultrasound to ensure no acute etiology in the right lower quadrant; however, as she is due for updated CT due to lymphadenopathy, will repeat CT which will also evaluate her abdominal pain.  Further recommendations to follow.

## 2020-05-28 NOTE — Assessment & Plan Note (Signed)
CT A/P with contrast in March 2021 with mild lymphadenopathy in the hepatoduodenal ligament slightly increased in interval but likely reactive.  Recommended follow-up CT in 3 months to ensure stability.  CT has not been arranged.  We will go ahead and pursue CT A/P with contrast at this time.  Updating creatinine as she may need reduced IV contrast due to kidney function.

## 2020-05-28 NOTE — Assessment & Plan Note (Signed)
69 year old female who had been experiencing unintentional weight loss with documented 12 pound weight loss between April 2020 in May 2021.  CT A/P March 2021 with no findings to explain weight loss. She had also reported early satiety and loss of appetite, so EGD was completed 02/24/2020 with gastritis s/p biopsy (nonspecific reactive gastropathy), normal examined duodenum s/p biopsy (benign).  Today patient states early satiety and loss of appetite have resolved.  She is now eating well and has gained 8 pounds in the last 4 months.  States her significant family stress is improving and feels this was the sole cause of her weight loss.  As she is doing much better, we will continue to monitor this for now.

## 2020-05-28 NOTE — Assessment & Plan Note (Signed)
69 year old female with history of hepatitis C s/p treatment with Harvoni achieving SVR in 2017 who was found to have elevated LFTs in May 2021 with alk phos of 675 (H), AST 218 (H), ALT 108 (H), total bilirubin within normal limits.  Also found to have ferritin 1097 (H), iron saturation 60% (H), iron 138.  Denies alcohol use or illicit drug use and hep C treatment, Tylenol use, or OTC supplements.  Abdominal ultrasound in July 2021 with normal-appearing liver.  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  Repeat iron panel 02/23/2020 with ferritin 58, iron 101, saturation 37% (H).  She has no signs or symptoms of decompensated liver disease.  No personal or family history of autoimmune conditions.  Etiology of elevated LFTs is not clear.  Will repeat HFP at this time.  If LFTs remain elevated, will complete additional laboratory evaluation with ANA, AMA, ASMA, immunoglobulins, ceruloplasmin, alpha-1 antitrypsin phenotype.

## 2020-05-28 NOTE — Patient Instructions (Signed)
Please have labs completed at [place] or [facility].  Please have CT of your abdomen and pelvis completed at [facility].  Monitor for worsening abdominal pain. If you develop any severe abdominal pain with nausea, vomiting, or fever please go to the emergency room.   Continue to avoid tylenol and alcohol.  Also avoid over the counter supplements.   Further recommendations to follow CT and labs.   Aliene Altes, PA-C [facility]

## 2020-06-11 DIAGNOSIS — R1031 Right lower quadrant pain: Secondary | ICD-10-CM | POA: Diagnosis not present

## 2020-06-11 DIAGNOSIS — R7989 Other specified abnormal findings of blood chemistry: Secondary | ICD-10-CM | POA: Diagnosis not present

## 2020-06-11 LAB — HEPATIC FUNCTION PANEL
AG Ratio: 1.2 (calc) (ref 1.0–2.5)
ALT: 12 U/L (ref 6–29)
AST: 19 U/L (ref 10–35)
Albumin: 3.7 g/dL (ref 3.6–5.1)
Alkaline phosphatase (APISO): 65 U/L (ref 37–153)
Bilirubin, Direct: 0.1 mg/dL (ref 0.0–0.2)
Globulin: 3 g/dL (calc) (ref 1.9–3.7)
Indirect Bilirubin: 0.2 mg/dL (calc) (ref 0.2–1.2)
Total Bilirubin: 0.3 mg/dL (ref 0.2–1.2)
Total Protein: 6.7 g/dL (ref 6.1–8.1)

## 2020-06-11 LAB — CREATININE WITH EST GFR
Creat: 1.09 mg/dL — ABNORMAL HIGH (ref 0.50–0.99)
GFR, Est African American: 61 mL/min/{1.73_m2} (ref >=60)
GFR, Est Non African American: 52 mL/min/{1.73_m2} — ABNORMAL LOW (ref >=60)

## 2020-06-14 ENCOUNTER — Other Ambulatory Visit: Payer: Self-pay

## 2020-06-14 DIAGNOSIS — R7989 Other specified abnormal findings of blood chemistry: Secondary | ICD-10-CM

## 2020-06-15 ENCOUNTER — Other Ambulatory Visit: Payer: Self-pay

## 2020-06-15 DIAGNOSIS — R7989 Other specified abnormal findings of blood chemistry: Secondary | ICD-10-CM

## 2020-06-24 ENCOUNTER — Other Ambulatory Visit: Payer: Self-pay

## 2020-06-24 ENCOUNTER — Encounter (HOSPITAL_COMMUNITY): Payer: Self-pay | Admitting: Radiology

## 2020-06-24 ENCOUNTER — Ambulatory Visit (HOSPITAL_COMMUNITY)
Admission: RE | Admit: 2020-06-24 | Discharge: 2020-06-24 | Disposition: A | Discharge status: Home / Self Care | Payer: Medicare Other | Source: Ambulatory Visit | Attending: Gastroenterology | Admitting: Gastroenterology

## 2020-06-24 DIAGNOSIS — R1031 Right lower quadrant pain: Secondary | ICD-10-CM | POA: Diagnosis not present

## 2020-06-24 DIAGNOSIS — R109 Unspecified abdominal pain: Secondary | ICD-10-CM | POA: Diagnosis not present

## 2020-06-24 DIAGNOSIS — R935 Abnormal findings on diagnostic imaging of other abdominal regions, including retroperitoneum: Secondary | ICD-10-CM | POA: Insufficient documentation

## 2020-06-24 MED ORDER — IOHEXOL 300 MG/ML  SOLN
100.0000 mL | Freq: Once | INTRAMUSCULAR | Status: AC | PRN
  Administered 2020-06-24: 17:00:00 100 mL via INTRAVENOUS

## 2020-06-24 MED ORDER — BARIUM SULFATE 2.1 % PO SUSP
ORAL | Status: AC
  Filled 2020-06-24: qty 2

## 2020-06-28 ENCOUNTER — Encounter: Payer: Self-pay | Admitting: Internal Medicine

## 2020-07-09 DIAGNOSIS — J449 Chronic obstructive pulmonary disease, unspecified: Secondary | ICD-10-CM | POA: Diagnosis not present

## 2020-07-09 DIAGNOSIS — K219 Gastro-esophageal reflux disease without esophagitis: Secondary | ICD-10-CM | POA: Diagnosis not present

## 2020-07-09 DIAGNOSIS — K5900 Constipation, unspecified: Secondary | ICD-10-CM | POA: Diagnosis not present

## 2020-08-05 DIAGNOSIS — M25551 Pain in right hip: Secondary | ICD-10-CM | POA: Diagnosis not present

## 2020-08-05 DIAGNOSIS — M79643 Pain in unspecified hand: Secondary | ICD-10-CM | POA: Diagnosis not present

## 2020-08-05 DIAGNOSIS — M533 Sacrococcygeal disorders, not elsewhere classified: Secondary | ICD-10-CM | POA: Diagnosis not present

## 2020-08-05 DIAGNOSIS — G4700 Insomnia, unspecified: Secondary | ICD-10-CM | POA: Diagnosis not present

## 2020-08-05 DIAGNOSIS — G4489 Other headache syndrome: Secondary | ICD-10-CM | POA: Diagnosis not present

## 2020-08-05 DIAGNOSIS — M542 Cervicalgia: Secondary | ICD-10-CM | POA: Diagnosis not present

## 2020-08-05 DIAGNOSIS — M545 Low back pain, unspecified: Secondary | ICD-10-CM | POA: Diagnosis not present

## 2020-08-05 DIAGNOSIS — I10 Essential (primary) hypertension: Secondary | ICD-10-CM | POA: Diagnosis not present

## 2020-08-05 DIAGNOSIS — G5603 Carpal tunnel syndrome, bilateral upper limbs: Secondary | ICD-10-CM | POA: Diagnosis not present

## 2020-08-05 DIAGNOSIS — M25519 Pain in unspecified shoulder: Secondary | ICD-10-CM | POA: Diagnosis not present

## 2020-08-07 DIAGNOSIS — E43 Unspecified severe protein-calorie malnutrition: Secondary | ICD-10-CM | POA: Diagnosis not present

## 2020-08-07 DIAGNOSIS — R1010 Upper abdominal pain, unspecified: Secondary | ICD-10-CM | POA: Diagnosis not present

## 2020-08-07 DIAGNOSIS — K5900 Constipation, unspecified: Secondary | ICD-10-CM | POA: Diagnosis not present

## 2020-08-07 DIAGNOSIS — M79604 Pain in right leg: Secondary | ICD-10-CM | POA: Diagnosis not present

## 2020-08-07 DIAGNOSIS — R634 Abnormal weight loss: Secondary | ICD-10-CM | POA: Diagnosis not present

## 2020-08-07 DIAGNOSIS — M79671 Pain in right foot: Secondary | ICD-10-CM | POA: Diagnosis not present

## 2020-08-07 DIAGNOSIS — K219 Gastro-esophageal reflux disease without esophagitis: Secondary | ICD-10-CM | POA: Diagnosis not present

## 2020-08-13 ENCOUNTER — Other Ambulatory Visit: Payer: Self-pay

## 2020-08-13 ENCOUNTER — Encounter: Payer: Self-pay | Admitting: Cardiology

## 2020-08-13 ENCOUNTER — Ambulatory Visit (INDEPENDENT_AMBULATORY_CARE_PROVIDER_SITE_OTHER): Payer: Medicare Other | Admitting: Cardiology

## 2020-08-13 VITALS — BP 122/72 | HR 84 | Ht 64.0 in | Wt 131.0 lb

## 2020-08-13 DIAGNOSIS — E782 Mixed hyperlipidemia: Secondary | ICD-10-CM | POA: Diagnosis not present

## 2020-08-13 DIAGNOSIS — R0789 Other chest pain: Secondary | ICD-10-CM

## 2020-08-13 DIAGNOSIS — I10 Essential (primary) hypertension: Secondary | ICD-10-CM | POA: Diagnosis not present

## 2020-08-13 MED ORDER — FUROSEMIDE 20 MG PO TABS: ORAL_TABLET | ORAL | 1 refills | Status: AC

## 2020-08-13 NOTE — Patient Instructions (Signed)
Medication Instructions:  Your physician recommends that you continue on your current medications as directed. Please refer to the Current Medication list given to you today.  *If you need a refill on your cardiac medications before your next appointment, please call your pharmacy*   Lab Work: None today If you have labs (blood work) drawn today and your tests are completely normal, you will receive your results only by: . MyChart Message (if you have MyChart) OR . A paper copy in the mail If you have any lab test that is abnormal or we need to change your treatment, we will call you to review the results.   Testing/Procedures: None today   Follow-Up: At [facility], you and your health needs are our priority.  As part of our continuing mission to provide you with exceptional heart care, we have created designated Provider Care Teams.  These Care Teams include your primary Cardiologist (physician) and Advanced Practice Providers (APPs -  Physician Assistants and Nurse Practitioners) who all work together to provide you with the care you need, when you need it.  We recommend signing up for the patient portal called "MyChart".  Sign up information is provided on this After Visit Summary.  MyChart is used to connect with patients for Virtual Visits (Telemedicine).  Patients are able to view lab/test results, encounter notes, upcoming appointments, etc.  Non-urgent messages can be sent to your provider as well.   To learn more about what you can do with MyChart, go to [email or web address].    Your next appointment:   6 month(s)  The format for your next appointment:   In Person  Provider:   Jonathan Branch, MD   Other Instructions None       Thank you for choosing [facility] !         

## 2020-08-13 NOTE — Progress Notes (Signed)
Clinical Summary Ms. [name] is a 70 y.o.female seen today for follow up of the following medical problems.   1. Chest pain - last seen in 2016. History of chest pain at that time.  04/2014 Nuclear stress test without ischemia.  05/2017 echo LVEF 60-65%, no WMAs, grade I diastolic dysfunction, mild MR 10/2018 CT chest coronary calcifications of LAD and RCA, mild to mod aortic atherosclerosis   CAD risk factors: HTN, HL, +smoker nearly 50 years, mother and father with heart troubles unknown specifics.   04/2019 nuclear stress: no ischemia - ASA causes stomach upset.   - chronic chest pains, some increase in severity but stable frequency. Resovles with SL NG, takes about 2 times per month   2. HTN -she is compliant with meds  3. Hyperlipidemia - compliant with statin Labs followed by pcp  4. COPD - followed by pcp    SH: going through separation from her husband   Past Medical History:  Diagnosis Date  . Allergy   . Anemia   . Angina pectoris ([place])   . Anxiety   . Arthritis   . Bronchitis   . Bronchitis   . COPD (chronic obstructive pulmonary disease) ([place])   . Depression   . GERD (gastroesophageal reflux disease)   . HCV (hepatitis C virus)    2018 COMPLETE VIROLOGIC RESPONSE  . High blood cholesterol level   . Hypertension   . Migraine   . Migraines   . Neuromuscular disorder ([place])   . Pneumonia   . PTSD (post-traumatic stress disorder)   . 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 Asa [Aspirin] Other (See Comments)    REACTION: Stomach upset  . Ibuprofen     STOMACH UPSET     Current Outpatient Medications  Medication Sig Dispense Refill  . albuterol (PROVENTIL) (2.5 MG/3ML) 0.083% nebulizer solution INHALE 1 VIAL VIA NEBULIZER EVERY 6 HOURS AS NEEDED FOR WHEEZING AND SHORTNESS OF BREATH 150 mL 1  . albuterol (VENTOLIN HFA) 108 (90 Base) MCG/ACT inhaler Inhale 1 puff into the lungs  every 6 (six) hours as needed for wheezing or shortness of breath.     Marland Kitchen amLODipine (NORVASC) 10 MG tablet Take 10 mg by mouth daily.    . Ascorbic Acid (VITAMIN C PO) Take 1 tablet by mouth daily.    Marland Kitchen atorvastatin (LIPITOR) 40 MG tablet Take 40 mg by mouth daily.     . busPIRone (BUSPAR) 5 MG tablet 3 (three) times daily as needed.    . Calcium-Magnesium-Zinc (CAL-MAG-ZINC PO) Take 1 tablet by mouth daily.     Marland Kitchen FLUoxetine (PROZAC) 40 MG capsule Take 40 mg by mouth daily.     . furosemide (LASIX) 20 MG tablet TAKE ONE TABLET BY mouth daily (Patient taking differently: TAKE ONE TABLET BY mouth daily prn) 90 tablet 0  . gabapentin (NEURONTIN) 100 MG capsule Take 100 mg by mouth at bedtime as needed (pain).     Marland Kitchen gabapentin (NEURONTIN) 300 MG capsule Take 300 mg by mouth at bedtime as needed (pain).     . meloxicam (MOBIC) 15 MG tablet Take 1 tablet (15 mg total) by mouth daily. (Patient taking differently: Take 15 mg by mouth daily as needed. ) 90 tablet 3  . mirtazapine (REMERON) 15 MG tablet Take 15 mg by mouth at bedtime.    . Multiple Vitamin (MULTIVITAMIN) tablet Take 1 tablet by mouth daily.    Marland Kitchen  nitroGLYCERIN (NITROSTAT) 0.4 MG SL tablet Place 0.4 mg under the tongue every 5 (five) minutes as needed for chest pain.    Marland Kitchen omeprazole (PRILOSEC) 20 MG capsule TAKE 1 CAPSULE BY MOUTH DAILY BEFORE A MEAL. (Patient taking differently: Take 20 mg by mouth daily. ) 90 capsule 3  . propranolol (INDERAL) 80 MG tablet Take 1 tablet (80 mg total) by mouth 2 (two) times daily. 180 tablet 1  . tiZANidine (ZANAFLEX) 2 MG tablet Take 1-2 tablets by mouth every 8 (eight) hours as needed.    . topiramate (TOPAMAX) 25 MG tablet at bedtime.    . traMADol (ULTRAM) 50 MG tablet Take 50-100 mg by mouth every 6 (six) hours as needed.    . traZODone (DESYREL) 50 MG tablet Take 25 mg by mouth at bedtime as needed for sleep.      No current facility-administered medications for this visit.     Past Surgical  History:  Procedure Laterality Date  . BIOPSY  02/09/2015   Procedure: GASTRIC BIOPSIES ;  Surgeon: Danie Binder, MD;  Location: [facility];  Service: Endoscopy;;  . BIOPSY  02/24/2020   Procedure: BIOPSY;  Surgeon: Eloise Harman, DO;  Location: [facility];  Service: Endoscopy;;  . COLONOSCOPY WITH PROPOFOL N/A 02/09/2015   SLF: 1. the examined terminal ileum appeared to be normal 2. the colonic mucosa appeared normal 3. small internal hemorrhoids  . ESOPHAGOGASTRODUODENOSCOPY (EGD) WITH PROPOFOL N/A 02/09/2015   SLF: 1. stricture at the gastroesophageal junction 2. mild non-erosive gastritis   . ESOPHAGOGASTRODUODENOSCOPY (EGD) WITH PROPOFOL N/A 02/24/2020   Procedure: ESOPHAGOGASTRODUODENOSCOPY (EGD) WITH PROPOFOL;  Surgeon: Eloise Harman, DO;  Gastritis s/p biopsy (nonspecific reactive gastropathy), normal examined duodenum s/p biopsy (benign).  Marland Kitchen GIVENS CAPSULE STUDY N/A 10/19/2016   Procedure: GIVENS CAPSULE STUDY;  Surgeon: Danie Binder, MD;  Location: [facility];  Service: Endoscopy;  Laterality: N/A;  7:30am, pt to arrive at 7:00am  . left arm     ? nerve repaired, " it was pinched".  . left elbow surgery Left   . SAVORY DILATION N/A 02/09/2015   Procedure: SAVORY DILATION 12.26mm, 33mm, 55mm, 57mm;  Surgeon: Danie Binder, MD;  Location: [facility];  Service: Endoscopy;  Laterality: N/A;  . TONSILLECTOMY    . TUBAL LIGATION       Allergies  Allergen Reactions  . Asa [Aspirin] Other (See Comments)    REACTION: Stomach upset  . Ibuprofen     STOMACH UPSET      Family History  Problem Relation Age of Onset  . Heart failure Father   . Hypertension Father   . Stroke Father   . Colon cancer Father        greater than age 42  . Cancer Father   . Dementia Father   . Cancer Mother   . Seizures Sister   . Inflammatory bowel disease Neg Hx   . Liver disease Neg Hx      Social History Ms. [name] reports that she has been smoking cigarettes. She started smoking about 47  years ago. She has a 10.50 pack-year smoking history. She has never used smokeless tobacco. Ms. [name] reports no history of alcohol use.   Review of Systems CONSTITUTIONAL: No weight loss, fever, chills, weakness or fatigue.  HEENT: Eyes: No visual loss, blurred vision, double vision or yellow sclerae.No hearing loss, sneezing, congestion, runny nose or sore throat.  SKIN: No rash or itching.  CARDIOVASCULAR: per hpi  RESPIRATORY: No shortness of breath, cough or sputum.  GASTROINTESTINAL: No anorexia, nausea, vomiting or diarrhea. No abdominal pain or blood.  GENITOURINARY: No burning on urination, no polyuria NEUROLOGICAL: No headache, dizziness, syncope, paralysis, ataxia, numbness or tingling in the extremities. No change in bowel or bladder control.  MUSCULOSKELETAL: No muscle, back pain, joint pain or stiffness.  LYMPHATICS: No enlarged nodes. No history of splenectomy.  PSYCHIATRIC: No history of depression or anxiety.  ENDOCRINOLOGIC: No reports of sweating, cold or heat intolerance. No polyuria or polydipsia.  Marland Kitchen   Physical Examination Today's Vitals   08/13/20 0917  BP: 122/72  Pulse: 84  SpO2: 98%  Weight: 131 lb (59.4 kg)  Height: 5\' 4"  (1.626 m)   Body mass index is 22.49 kg/m.  Gen: resting comfortably, no acute distress HEENT: no scleral icterus, pupils equal round and reactive, no palptable cervical adenopathy,  CV: RRR, no m/r/g no jvd Resp: Clear to auscultation bilaterally GI: abdomen is soft, non-tender, non-distended, normal bowel sounds, no hepatosplenomegaly MSK: extremities are warm, no edema.  Skin: warm, no rash Neuro:  no focal deficits Psych: appropriate affect   Diagnostic Studies     Assessment and Plan  1. Chest pain - several year history of chest pains with negative ischemic evaluations most recently 04/2019 - chronic stable symptoms, continue to monitor. If increase in frequency would try low dose imdur given that symptoms improve  with SL NG   2. HTN - at goal, continue current meds  3. Hyperlipidemia -request pcp labs, continue statin     Arnoldo Lenis, M.D.

## 2020-11-03 DIAGNOSIS — M79643 Pain in unspecified hand: Secondary | ICD-10-CM | POA: Diagnosis not present

## 2020-11-03 DIAGNOSIS — M533 Sacrococcygeal disorders, not elsewhere classified: Secondary | ICD-10-CM | POA: Diagnosis not present

## 2020-11-03 DIAGNOSIS — M542 Cervicalgia: Secondary | ICD-10-CM | POA: Diagnosis not present

## 2020-11-03 DIAGNOSIS — G4700 Insomnia, unspecified: Secondary | ICD-10-CM | POA: Diagnosis not present

## 2020-11-03 DIAGNOSIS — M25551 Pain in right hip: Secondary | ICD-10-CM | POA: Diagnosis not present

## 2020-11-03 DIAGNOSIS — M25519 Pain in unspecified shoulder: Secondary | ICD-10-CM | POA: Diagnosis not present

## 2020-11-03 DIAGNOSIS — G4489 Other headache syndrome: Secondary | ICD-10-CM | POA: Diagnosis not present

## 2020-11-03 DIAGNOSIS — I10 Essential (primary) hypertension: Secondary | ICD-10-CM | POA: Diagnosis not present

## 2020-11-03 DIAGNOSIS — G5603 Carpal tunnel syndrome, bilateral upper limbs: Secondary | ICD-10-CM | POA: Diagnosis not present

## 2020-11-03 DIAGNOSIS — M545 Low back pain, unspecified: Secondary | ICD-10-CM | POA: Diagnosis not present

## 2020-11-07 DIAGNOSIS — K219 Gastro-esophageal reflux disease without esophagitis: Secondary | ICD-10-CM | POA: Diagnosis not present

## 2020-11-10 DIAGNOSIS — D509 Iron deficiency anemia, unspecified: Secondary | ICD-10-CM | POA: Diagnosis not present

## 2020-11-10 DIAGNOSIS — K219 Gastro-esophageal reflux disease without esophagitis: Secondary | ICD-10-CM | POA: Diagnosis not present

## 2020-11-10 DIAGNOSIS — Z0001 Encounter for general adult medical examination with abnormal findings: Secondary | ICD-10-CM | POA: Diagnosis not present

## 2020-11-10 DIAGNOSIS — G43909 Migraine, unspecified, not intractable, without status migrainosus: Secondary | ICD-10-CM | POA: Diagnosis not present

## 2020-11-10 DIAGNOSIS — J449 Chronic obstructive pulmonary disease, unspecified: Secondary | ICD-10-CM | POA: Diagnosis not present

## 2020-11-10 DIAGNOSIS — I2510 Atherosclerotic heart disease of native coronary artery without angina pectoris: Secondary | ICD-10-CM | POA: Diagnosis not present

## 2020-11-10 DIAGNOSIS — I739 Peripheral vascular disease, unspecified: Secondary | ICD-10-CM | POA: Diagnosis not present

## 2020-11-10 DIAGNOSIS — R945 Abnormal results of liver function studies: Secondary | ICD-10-CM | POA: Diagnosis not present

## 2020-11-15 ENCOUNTER — Other Ambulatory Visit (HOSPITAL_COMMUNITY): Payer: Self-pay | Admitting: Family Medicine

## 2020-11-15 DIAGNOSIS — Z1231 Encounter for screening mammogram for malignant neoplasm of breast: Secondary | ICD-10-CM

## 2020-11-17 ENCOUNTER — Other Ambulatory Visit: Payer: Self-pay

## 2020-11-17 ENCOUNTER — Ambulatory Visit (HOSPITAL_COMMUNITY)
Admission: RE | Admit: 2020-11-17 | Discharge: 2020-11-17 | Disposition: A | Discharge status: Home / Self Care | Payer: Medicare Other | Source: Ambulatory Visit | Attending: Family Medicine | Admitting: Family Medicine

## 2020-11-17 DIAGNOSIS — Z1231 Encounter for screening mammogram for malignant neoplasm of breast: Secondary | ICD-10-CM | POA: Insufficient documentation

## 2020-11-18 ENCOUNTER — Encounter (HOSPITAL_COMMUNITY): Payer: Self-pay | Admitting: *Deleted

## 2020-11-18 ENCOUNTER — Other Ambulatory Visit: Payer: Self-pay

## 2020-11-18 ENCOUNTER — Emergency Department (HOSPITAL_COMMUNITY)
Admission: EM | Admit: 2020-11-18 | Discharge: 2020-11-18 | Disposition: A | Discharge status: Home / Self Care | Payer: Medicare Other | Attending: Emergency Medicine | Admitting: Emergency Medicine

## 2020-11-18 DIAGNOSIS — I10 Essential (primary) hypertension: Secondary | ICD-10-CM | POA: Insufficient documentation

## 2020-11-18 DIAGNOSIS — M79604 Pain in right leg: Secondary | ICD-10-CM | POA: Diagnosis not present

## 2020-11-18 DIAGNOSIS — J449 Chronic obstructive pulmonary disease, unspecified: Secondary | ICD-10-CM | POA: Insufficient documentation

## 2020-11-18 DIAGNOSIS — Z79899 Other long term (current) drug therapy: Secondary | ICD-10-CM | POA: Insufficient documentation

## 2020-11-18 DIAGNOSIS — F17210 Nicotine dependence, cigarettes, uncomplicated: Secondary | ICD-10-CM | POA: Insufficient documentation

## 2020-11-18 MED ORDER — DEXAMETHASONE SODIUM PHOSPHATE 10 MG/ML IJ SOLN
10.0000 mg | Freq: Once | INTRAMUSCULAR | Status: AC
  Administered 2020-11-18: 10 mg via INTRAMUSCULAR
  Filled 2020-11-18: qty 1

## 2020-11-18 NOTE — ED Triage Notes (Signed)
Pt c/o right knee pain "for a while"; pt denies any injury

## 2020-11-18 NOTE — ED Notes (Signed)
Pt undressed and ultasound at beside. Waiting on EDP

## 2020-11-18 NOTE — Discharge Instructions (Signed)
I would encourage you to follow-up with the neurology office that you have been seen at at [facility] neurology and Dr. Merlene Laughter.  You do not have a blood clot in your leg today based on my ultrasound.  I have given you a shot of a steroid which should help but you will need to follow-up with the office for further evaluation and potential for other treatments for your pain

## 2020-11-18 NOTE — ED Provider Notes (Signed)
[facility] EMERGENCY DEPARTMENT Provider Note   CSN: [id number] Arrival date & time: 11/18/20  2009     History Chief Complaint  Patient presents with  . Knee Pain    Right knee pain    Cynthia [name] is a 70 y.o. female.  HPI   This patient is a 70 year old female, she has a history of substance abuse in remission, posttraumatic stress disorder, she has a history of what has been described as a neuromuscular disorder, she has chronic pain in her right leg for which she has been prescribed multiple different medications over time, she has a prescription for tramadol, she has a prescription for gabapentin, she continues to have pain which has been present for approximately 1 year.  She reports the pain is getting worse, she feels like the pain is radiating from her foot all the way up to her back, she has no numbness or weakness but feels a burning sensation inside her leg at all times.  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  Each of these times diagnosed with pain in the sacroiliac joint, chronic low back pain.  No history of DVT  Past Medical History:  Diagnosis Date  . Allergy   . Anemia   . Angina pectoris ([place])   . Anxiety   . Arthritis   . Bronchitis   . Bronchitis   . COPD (chronic obstructive pulmonary disease) ([place])   . Depression   . GERD (gastroesophageal reflux disease)   . HCV (hepatitis C virus)    2018 COMPLETE VIROLOGIC RESPONSE  . High blood cholesterol level   . Hypertension   . Migraine   . Migraines   . Neuromuscular disorder ([place])   . Pneumonia   . PTSD (post-traumatic stress disorder)   . 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 Abnormal CT of the abdomen  05/28/2020  . Abdominal pain 05/28/2020  . Elevated LFTs 01/29/2020  . Loss of weight 01/29/2020  . Early satiety 01/29/2020  . Loss of appetite 01/29/2020  . Somnolence, daytime 06/13/2017  . Tobacco abuse 05/02/2017  . 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 Atherosclerotic peripheral vascular disease with intermittent claudication ([place]) 01/25/2017  . GERD (gastroesophageal reflux disease) 10/13/2015  . Chronic obstructive pulmonary disease ([place]) 07/14/2015  . Obstructive chronic bronchitis without exacerbation ([place]) 04/13/2015  . Headache, tension type, chronic 04/13/2015  . Anxiety state 02/27/2015  . FH: colon cancer 01/15/2015  . Hepatitis C virus infection resolved after antiviral drug therapy 01/15/2015  . Hyperlipidemia 11/26/2008  . UNSPECIFIED ANEMIA 11/26/2008  . MIGRAINE HEADACHE 11/11/2008  . Essential hypertension, benign 11/11/2008  . Osteoarthritis of hands, bilateral 11/11/2008    Past Surgical History:  Procedure Laterality Date  . BIOPSY  02/09/2015   Procedure: GASTRIC BIOPSIES ;  Surgeon: Danie Binder, MD;  Location: [facility];  Service: Endoscopy;;  . BIOPSY  02/24/2020   Procedure: BIOPSY;  Surgeon: Eloise Harman, DO;  Location: [facility];  Service: Endoscopy;;  . COLONOSCOPY WITH PROPOFOL N/A 02/09/2015   SLF: 1. the examined terminal ileum appeared to be normal 2.  the colonic mucosa appeared normal 3. small internal hemorrhoids  . ESOPHAGOGASTRODUODENOSCOPY (EGD) WITH PROPOFOL N/A 02/09/2015   SLF: 1. stricture at the gastroesophageal junction 2. mild non-erosive gastritis   . ESOPHAGOGASTRODUODENOSCOPY (EGD) WITH PROPOFOL N/A 02/24/2020   Procedure: ESOPHAGOGASTRODUODENOSCOPY (EGD) WITH PROPOFOL;  Surgeon: Eloise Harman, DO;  Gastritis s/p biopsy (nonspecific reactive gastropathy), normal examined duodenum s/p biopsy (benign).  Marland Kitchen GIVENS CAPSULE STUDY N/A 10/19/2016   Procedure:  GIVENS CAPSULE STUDY;  Surgeon: Danie Binder, MD;  [facility];  Service: Endoscopy;  Laterality: N/A;  7:30am, pt to arrive at 7:00am  . left arm     ? nerve repaired, " it was pinched".  . left elbow surgery Left   . SAVORY DILATION N/A 02/09/2015   Procedure: SAVORY DILATION 12.104mm, 18mm, 28mm, 66mm;  Surgeon: Danie Binder, MD;  Location: [facility];  Service: Endoscopy;  Laterality: N/A;  . TONSILLECTOMY    . TUBAL LIGATION       OB History    Gravida  2   Para  1   Term      Preterm      AB  1   Living        SAB      IAB      Ectopic      Multiple      Live Births              Family History  Problem Relation Age of Onset  . Heart failure Father   . Hypertension Father   . Stroke Father   . Colon cancer Father        greater than age 42  . Cancer Father   . Dementia Father   . Cancer Mother   . Seizures Sister   . Inflammatory bowel disease Neg Hx   . Liver disease Neg Hx     Social History   Tobacco Use  . Smoking status: Current Every Day Smoker    Packs/day: 0.25    Years: 42.00    Pack years: 10.50    Types: Cigarettes    Start date: 09/29/1972  . Smokeless tobacco: Never Used  . Tobacco comment: about 2 cigs per day  Vaping Use  . Vaping Use: Never used  Substance Use Topics  . Alcohol use: No    Alcohol/week: 0.0 standard drinks  . Drug use: Not Currently    Types: "Crack" cocaine    Comment: hx of smoking crack cocaine last 5-6 years ago    Home Medications Prior to Admission medications   Medication Sig Start Date End Date Taking? Authorizing Provider  albuterol (PROVENTIL) (2.5 MG/3ML) 0.083% nebulizer solution INHALE 1 VIAL VIA NEBULIZER EVERY 6 HOURS AS NEEDED FOR WHEEZING AND SHORTNESS OF BREATH 12/25/16   Soyla Dryer, PA-C  albuterol (VENTOLIN HFA) 108 (90 Base) MCG/ACT inhaler Inhale 1 puff into the lungs every 6 (six) hours as needed for wheezing or shortness of breath.  01/06/19   [provider]   amLODipine (NORVASC) 10 MG tablet Take 10 mg by mouth daily.    [provider]  Ascorbic Acid (VITAMIN C PO) Take 1 tablet by mouth daily.    [provider]  atorvastatin (LIPITOR) 40 MG tablet Take 40 mg by mouth daily.  04/09/19   [provider]  busPIRone (BUSPAR) 5 MG tablet 3 (three) times daily as needed.    [provider]  Calcium-Magnesium-Zinc (CAL-MAG-ZINC PO) Take 1 tablet by  mouth daily.     [provider]  FLUoxetine (PROZAC) 40 MG capsule Take 40 mg by mouth daily.  08/13/17   [provider]  furosemide (LASIX) 20 MG tablet TAKE ONE TABLET BY mouth daily prn 08/13/20   Arnoldo Lenis, MD  gabapentin (NEURONTIN) 100 MG capsule Take 100 mg by mouth at bedtime as needed (pain).  01/29/20   [provider]  gabapentin (NEURONTIN) 300 MG capsule Take 300 mg by mouth at bedtime as needed (pain).     [provider]  meloxicam (MOBIC) 15 MG tablet Take 1 tablet (15 mg total) by mouth daily. Patient taking differently: Take 15 mg by mouth daily as needed. 08/13/17   Raylene Everts, MD  mirtazapine (REMERON) 15 MG tablet Take 15 mg by mouth at bedtime.    [provider]  Multiple Vitamin (MULTIVITAMIN) tablet Take 1 tablet by mouth daily.    [provider]  nitroGLYCERIN (NITROSTAT) 0.4 MG SL tablet Place 0.4 mg under the tongue every 5 (five) minutes as needed for chest pain.    [provider]  omeprazole (PRILOSEC) 20 MG capsule TAKE 1 CAPSULE BY MOUTH DAILY BEFORE A MEAL. Patient taking differently: Take 20 mg by mouth daily. 05/15/18   Mahala Menghini, PA-C  propranolol (INDERAL) 80 MG tablet Take 1 tablet (80 mg total) by mouth 2 (two) times daily. 10/30/17   Raylene Everts, MD  tiZANidine (ZANAFLEX) 2 MG tablet Take 1-2 tablets by mouth every 8 (eight) hours as needed. 04/14/19   [provider]  topiramate (TOPAMAX) 25 MG tablet at bedtime.    [provider]   traMADol (ULTRAM) 50 MG tablet Take 50-100 mg by mouth every 6 (six) hours as needed.    [provider]  traZODone (DESYREL) 50 MG tablet Take 25 mg by mouth at bedtime as needed for sleep.     [provider]    Allergies    Asa [aspirin] and Ibuprofen  Review of Systems   Review of Systems  Constitutional: Negative for fever.  Musculoskeletal: Positive for back pain. Negative for neck pain.  Skin: Negative for rash and wound.  Neurological: Negative for weakness and numbness.    Physical Exam Updated Vital Signs BP 110/61   Pulse 78   Temp (!) 97.5 F (36.4 C) (Oral)   Resp 20   Ht 1.626 m (5\' 4" )   Wt 59.9 kg   SpO2 99%   BMI 22.66 kg/m   Physical Exam Vitals and nursing note reviewed.  Constitutional:      Appearance: She is well-developed. She is not diaphoretic.  HENT:     Head: Normocephalic and atraumatic.  Eyes:     General:        Right eye: No discharge.        Left eye: No discharge.     Conjunctiva/sclera: Conjunctivae normal.  Cardiovascular:     Pulses: Normal pulses.  Pulmonary:     Effort: Pulmonary effort is normal. No respiratory distress.  Musculoskeletal:        General: Swelling and tenderness present. No deformity or signs of injury.     Right lower leg: Edema present.     Left lower leg: No edema.     Comments: Normal range of motion of all 4 extremities except for the right lower extremity because the patient has pain with any movement of the right leg at the hip knee or ankle joints.  Skin  is normal in these areas  Skin:    General: Skin is warm and dry.     Findings: No erythema or rash.  Neurological:     Mental Status: She is alert.     Coordination: Coordination normal.     Comments: No weakness, able to move all 4 extremities - speech clear, no facial droop     ED Results / Procedures / Treatments   Labs (all labs ordered are listed, but only abnormal results are displayed) Labs Reviewed - No data to  display  EKG None  Radiology MM 3D SCREEN BREAST BILATERAL  Result Date: 11/17/2020 CLINICAL DATA:  Screening. EXAM: DIGITAL SCREENING BILATERAL MAMMOGRAM WITH TOMOSYNTHESIS AND CAD TECHNIQUE: Bilateral screening digital craniocaudal and mediolateral oblique mammograms were obtained. Bilateral screening digital breast tomosynthesis was performed. The images were evaluated with computer-aided detection. COMPARISON:  Previous exam(s). ACR Breast Density Category c: The breast tissue is heterogeneously dense, which may obscure small masses. FINDINGS: There are no findings suspicious for malignancy. The images were evaluated with computer-aided detection. IMPRESSION: No mammographic evidence of malignancy. A result letter of this screening mammogram will be mailed directly to the patient. RECOMMENDATION: Screening mammogram in one year. (Code:SM-B-01Y) BI-RADS CATEGORY  1: Negative. Electronically Signed   By: Dorise Bullion III M.D   On: 11/17/2020 15:16    Procedures Procedures   Medications Ordered in ED Medications  dexamethasone (DECADRON) injection 10 mg (10 mg Intramuscular Given 11/18/20 2100)    ED Course  I have reviewed the triage vital signs and the nursing notes.  Pertinent labs & imaging results that were available during my care of the patient were reviewed by me and considered in my medical decision making (see chart for details).    MDM Rules/Calculators/A&P                          Do a bedside ultrasound for DVT and have her come back in the morning for the same, review of the medical record shows that she did have a CT scan of her hip in October 2021, she has had plain films of her back in that same month, no acute findings were seen at that time, lumbar spine x-ray showed degenerative changes without acute abnormalities.  Patient given steroid, DVT study at the bedside is negative, images were not archived.  Fully compressible veins at the femoral and popliteal areas.   Vital signs are normal, stable for discharge to follow-up with neurology and PCP  Final Clinical Impression(s) / ED Diagnoses Final diagnoses:  Right leg pain    Rx / DC Orders ED Discharge Orders    None       Noemi Chapel, MD 11/18/20 2143

## 2020-12-09 DIAGNOSIS — M79604 Pain in right leg: Secondary | ICD-10-CM | POA: Diagnosis not present

## 2020-12-15 DIAGNOSIS — M545 Low back pain, unspecified: Secondary | ICD-10-CM | POA: Diagnosis not present

## 2020-12-15 DIAGNOSIS — Z79891 Long term (current) use of opiate analgesic: Secondary | ICD-10-CM | POA: Diagnosis not present

## 2020-12-15 DIAGNOSIS — G4489 Other headache syndrome: Secondary | ICD-10-CM | POA: Diagnosis not present

## 2020-12-15 DIAGNOSIS — I10 Essential (primary) hypertension: Secondary | ICD-10-CM | POA: Diagnosis not present

## 2020-12-15 DIAGNOSIS — M79643 Pain in unspecified hand: Secondary | ICD-10-CM | POA: Diagnosis not present

## 2020-12-15 DIAGNOSIS — M542 Cervicalgia: Secondary | ICD-10-CM | POA: Diagnosis not present

## 2020-12-15 DIAGNOSIS — M25551 Pain in right hip: Secondary | ICD-10-CM | POA: Diagnosis not present

## 2020-12-15 DIAGNOSIS — G5603 Carpal tunnel syndrome, bilateral upper limbs: Secondary | ICD-10-CM | POA: Diagnosis not present

## 2020-12-15 DIAGNOSIS — M5416 Radiculopathy, lumbar region: Secondary | ICD-10-CM | POA: Diagnosis not present

## 2020-12-15 DIAGNOSIS — M533 Sacrococcygeal disorders, not elsewhere classified: Secondary | ICD-10-CM | POA: Diagnosis not present

## 2020-12-15 DIAGNOSIS — G4700 Insomnia, unspecified: Secondary | ICD-10-CM | POA: Diagnosis not present

## 2020-12-15 DIAGNOSIS — M25519 Pain in unspecified shoulder: Secondary | ICD-10-CM | POA: Diagnosis not present

## 2020-12-17 ENCOUNTER — Other Ambulatory Visit (HOSPITAL_COMMUNITY): Payer: Self-pay | Admitting: Neurology

## 2020-12-17 ENCOUNTER — Other Ambulatory Visit: Payer: Self-pay | Admitting: Neurology

## 2020-12-17 DIAGNOSIS — M5416 Radiculopathy, lumbar region: Secondary | ICD-10-CM

## 2020-12-17 DIAGNOSIS — M5412 Radiculopathy, cervical region: Secondary | ICD-10-CM

## 2020-12-25 NOTE — Progress Notes (Deleted)
Referring Provider: Celene Squibb, MD Primary Care Physician:  Celene Squibb, MD Primary GI Physician: Dr. Abbey Chatters  No chief complaint on file.   HPI:   Cynthia [name] is a 70 y.o. female presenting today for follow-up of of weight loss, elevated LFTs, and anemia.   GI history of GERD, hep C s/p treatment with Harvoni in 2017, F2/F3 on ultrasound with elastography prior to hep C treatment, repeat elastography in February 2018 with F0/F1.  H. pylori in August 2016 with eradication confirmed in August 2017.  Chronic anemia.  Colonoscopy August 2016 with normal exam other than internal hemorrhoids, repeat in 2026.  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 Pylori).  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 Hemoglobin has remained stable in the 10-11 range.   Elevated LFTs in May 2021 with alk phos of 675 (H), AST 218 (H), ALT 108 (H), total bilirubin within normal limits. No alcohol, ilicit drug use, tylenol, otc supplement. Also with elevated ferritin and iron saturation.  Abdominal ultrasound with no significant abnormalities.  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  LFTs returned to normal in December 2021.  Etiology unclear.  Query med effect as Ativan was discontinued.  Recommended HFP in 3 months, but this has not been completed.  Last seen in our office 05/28/2020.  Intermittent lower right-sided abdominal pain/pelvic pain x1 month that was closely associated with pain radiating from her right hip down her leg.  She has been placed on tramadol and gabapentin which was helping.  No association with bowel movements or meals.  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  She previously had mild lymphadenopathy on CT and March 2021 which was pursued due to weight loss with recommendations to repeat in 3 months, but this has not been arranged.  Plan to go ahead and pursue CT.  Regarding weight loss, she reported early satiety and lack of appetite resolved.  She was eating well and had gained 8 pounds in the last 4 months.  Stated her significant family stress was improving and feels this was the sole cause of her weight loss.  Regarding elevated LFTs, continued to deny alcohol, illicit drug use, Tylenol, OTC supplements, herbal teas, personal or family history of autoimmune conditions.  No signs or symptoms of decompensated liver disease.  Plan to update HFP.  HFP 07/08/2020 with LFTs within normal limits. CT A/P with contrast completed 06/24/2020 with no adenopathy and no acute findings.  Today:  Elevated LFT: Repeat HFP  Weight loss:  Anemia: Recent labs?  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 the examined terminal ileum appeared to be normal 2. the colonic mucosa appeared normal 3. small internal hemorrhoids    ESOPHAGOGASTRODUODENOSCOPY (EGD) WITH PROPOFOL N/A 02/09/2015   SLF: 1. stricture at the gastroesophageal junction 2. mild non-erosive gastritis    ESOPHAGOGASTRODUODENOSCOPY (EGD) WITH PROPOFOL N/A 02/24/2020   Procedure: ESOPHAGOGASTRODUODENOSCOPY (EGD) WITH PROPOFOL;  Surgeon: Eloise Harman, DO;  Gastritis s/p biopsy (nonspecific reactive gastropathy), normal examined duodenum s/p biopsy (benign).   GIVENS CAPSULE STUDY N/A 10/19/2016   Procedure: GIVENS CAPSULE STUDY;  Surgeon: Danie Binder, MD;  [facility];  Service: Endoscopy;  Laterality: N/A;  7:30am, pt to arrive at 7:00am   left arm     ? nerve repaired, " it was pinched".   left elbow surgery Left    SAVORY DILATION N/A 02/09/2015   Procedure: SAVORY DILATION 12.29m, 140m [id number]: SandDanie Binder;  Location: [facility];  Service: Endoscopy;  Laterality: N/A;   TONSILLECTOMY     TUBAL LIGATION      Current Outpatient Medications  Medication Sig Dispense Refill   albuterol (PROVENTIL) (2.5 MG/3ML) 0.083% nebulizer solution INHALE 1 VIAL VIA NEBULIZER EVERY 6 HOURS AS NEEDED FOR WHEEZING AND SHORTNESS OF BREATH 150 mL 1   albuterol (VENTOLIN HFA) 108 (90 Base) MCG/ACT inhaler Inhale 1 puff into the lungs every 6 (six) hours as needed for wheezing or shortness of breath.      amLODipine (NORVASC) 10 MG tablet Take 10 mg by mouth daily.     Ascorbic Acid (VITAMIN C PO) Take 1 tablet by mouth daily.     atorvastatin (LIPITOR) 40 MG tablet Take 40 mg by mouth daily.      busPIRone (BUSPAR) 5 MG tablet 3 (three) times daily as needed.     Calcium-Magnesium-Zinc (CAL-MAG-ZINC PO) Take 1 tablet by mouth daily.      FLUoxetine (PROZAC) 40 MG capsule Take 40 mg by mouth daily.      furosemide (LASIX) 20 MG tablet TAKE ONE TABLET BY mouth daily prn 90 tablet 1   gabapentin (NEURONTIN) 100 MG capsule Take 100 mg by mouth at bedtime as needed (pain).      gabapentin (NEURONTIN) 300 MG capsule Take 300 mg by mouth at  bedtime as needed (pain).      meloxicam (MOBIC) 15 MG tablet Take 1 tablet (15 mg total) by mouth daily. (Patient taking differently: Take 15 mg by mouth daily as needed.) 90 tablet 3   mirtazapine (REMERON) 15 MG tablet Take 15 mg by mouth at bedtime.     Multiple Vitamin (MULTIVITAMIN) tablet Take 1 tablet by mouth daily.     nitroGLYCERIN (NITROSTAT) 0.4 MG SL tablet Place 0.4 mg under the tongue every 5 (five) minutes as needed for chest pain.     omeprazole (PRILOSEC) 20 MG capsule TAKE 1 CAPSULE BY MOUTH DAILY BEFORE A MEAL. (Patient taking differently: Take 20 mg by mouth daily.) 90 capsule 3   propranolol (INDERAL) 80 MG tablet Take 1 tablet (80 mg total) by mouth 2 (two) times daily. 180 tablet 1   tiZANidine (ZANAFLEX) 2  MG tablet Take 1-2 tablets by mouth every 8 (eight) hours as needed.     topiramate (TOPAMAX) 25 MG tablet at bedtime.     traMADol (ULTRAM) 50 MG tablet Take 50-100 mg by mouth every 6 (six) hours as needed.     traZODone (DESYREL) 50 MG tablet Take 25 mg by mouth at bedtime as needed for sleep.      No current facility-administered medications for this visit.    Allergies as of 12/27/2020 - Review Complete 11/18/2020  Allergen Reaction Noted   Asa [aspirin] Other (See Comments) 11/11/2008   Ibuprofen  05/23/2019    Family History  Problem Relation Age of Onset   Heart failure Father    Hypertension Father    Stroke Father    Colon cancer Father        greater than age 25   Cancer Father    Dementia Father    Cancer Mother    Seizures Sister    Inflammatory bowel disease Neg Hx    Liver disease Neg Hx     Social History   Socioeconomic History   Marital status: Legally Separated    Spouse name: Cheno   Number of children: 1   Years of education: Not on file   Highest education level: Not on file  Occupational History   Occupation: disability    Comment: lung disease / COPD  Tobacco Use   Smoking status: Every Day    Packs/day: 0.25     Years: 42.00    Pack years: 10.50    Types: Cigarettes    Start date: 09/29/1972   Smokeless tobacco: Never   Tobacco comments:    about 2 cigs per day  Vaping Use   Vaping Use: Never used  Substance and Sexual Activity   Alcohol use: No    Alcohol/week: 0.0 standard drinks   Drug use: Not Currently    Types: "Crack" cocaine    Comment: hx of smoking crack cocaine last 5-6 years ago   Sexual activity: Never    Birth control/protection: None  Other Topics Concern   Not on file  Social History Narrative   Married to [employer] for 28 years   Keep her sister - she has seizures/disabled   Social Determinants of Radio broadcast assistant Strain: Not on file  Food Insecurity: Not on file  Transportation Needs: Not on file  Physical Activity: Not on file  Stress: Not on file  Social Connections: Not on file    Review of Systems: Gen: Denies fever, chills, anorexia. Denies fatigue, weakness, weight loss.  CV: Denies chest pain, palpitations, syncope, peripheral edema, and claudication. Resp: Denies dyspnea at rest, cough, wheezing, coughing up blood, and pleurisy. GI: Denies vomiting blood, jaundice, and fecal incontinence.   Denies dysphagia or odynophagia. Derm: Denies rash, itching, dry skin Psych: Denies depression, anxiety, memory loss, confusion. No homicidal or suicidal ideation.  Heme: Denies bruising, bleeding, and enlarged lymph nodes.  Physical Exam: There were no vitals taken for this visit. General:   Alert and oriented. No distress noted. Pleasant and cooperative.  Head:  Normocephalic and atraumatic. Eyes:  Conjuctiva clear without scleral icterus. Mouth:  Oral mucosa pink and moist. Good dentition. No lesions. Heart:  S1, S2 present without murmurs appreciated. Lungs:  Clear to auscultation bilaterally. No wheezes, rales, or rhonchi. No distress.  Abdomen:  +BS, soft, non-tender and non-distended. No rebound or guarding. No HSM or masses noted. Msk:  Symmetrical  without gross  deformities. Normal posture. Extremities:  Without edema. Neurologic:  Alert and  oriented x4 Psych:  Alert and cooperative. Normal mood and affect.

## 2020-12-27 ENCOUNTER — Encounter: Payer: Self-pay | Admitting: Internal Medicine

## 2020-12-27 ENCOUNTER — Ambulatory Visit: Payer: Medicare Other | Admitting: Gastroenterology

## 2020-12-30 ENCOUNTER — Other Ambulatory Visit: Payer: Self-pay

## 2020-12-30 ENCOUNTER — Ambulatory Visit (HOSPITAL_COMMUNITY)
Admission: RE | Admit: 2020-12-30 | Discharge: 2020-12-30 | Disposition: A | Discharge status: Home / Self Care | Payer: Medicare Other | Source: Ambulatory Visit | Attending: Neurology | Admitting: Neurology

## 2020-12-30 DIAGNOSIS — M5416 Radiculopathy, lumbar region: Secondary | ICD-10-CM | POA: Insufficient documentation

## 2020-12-30 DIAGNOSIS — M5412 Radiculopathy, cervical region: Secondary | ICD-10-CM | POA: Diagnosis not present

## 2020-12-30 DIAGNOSIS — M545 Low back pain, unspecified: Secondary | ICD-10-CM | POA: Diagnosis not present

## 2020-12-30 DIAGNOSIS — M542 Cervicalgia: Secondary | ICD-10-CM | POA: Diagnosis not present

## 2021-01-06 DIAGNOSIS — K219 Gastro-esophageal reflux disease without esophagitis: Secondary | ICD-10-CM | POA: Diagnosis not present

## 2021-01-25 DIAGNOSIS — M47816 Spondylosis without myelopathy or radiculopathy, lumbar region: Secondary | ICD-10-CM | POA: Diagnosis not present

## 2021-01-25 DIAGNOSIS — M792 Neuralgia and neuritis, unspecified: Secondary | ICD-10-CM | POA: Diagnosis not present

## 2021-01-25 DIAGNOSIS — Z79891 Long term (current) use of opiate analgesic: Secondary | ICD-10-CM | POA: Diagnosis not present

## 2021-02-06 DIAGNOSIS — K219 Gastro-esophageal reflux disease without esophagitis: Secondary | ICD-10-CM | POA: Diagnosis not present

## 2021-02-11 ENCOUNTER — Ambulatory Visit: Payer: Medicare Other | Admitting: Cardiology

## 2021-02-11 ENCOUNTER — Other Ambulatory Visit: Payer: Self-pay

## 2021-02-11 ENCOUNTER — Encounter: Payer: Self-pay | Admitting: Cardiology

## 2021-02-11 VITALS — BP 122/66 | HR 78 | Ht 63.0 in | Wt 138.0 lb

## 2021-02-11 DIAGNOSIS — E782 Mixed hyperlipidemia: Secondary | ICD-10-CM | POA: Diagnosis not present

## 2021-02-11 DIAGNOSIS — I10 Essential (primary) hypertension: Secondary | ICD-10-CM

## 2021-02-11 DIAGNOSIS — R0789 Other chest pain: Secondary | ICD-10-CM | POA: Diagnosis not present

## 2021-02-11 NOTE — Progress Notes (Signed)
Clinical Summary [name] is a 70 y.o.female seen today for follow up of the following medical problems.    1. Chest pain - last seen in 2016. History of chest pain at that time. 04/2014 Nuclear stress test without ischemia. 05/2017 echo LVEF 60-65%, no WMAs, grade I diastolic dysfunction, mild MR 10/2018 CT chest coronary calcifications of LAD and RCA, mild to mod aortic atherosclerosis     CAD risk factors: HTN, HL, +smoker nearly 50 years, mother and father with heart troubles unknown specifics.      04/2019 nuclear stress: no ischemia - ASA causes stomach upset.    - infrequent chest pains at times consistent with her chronic symptoms   2. HTN -she is compliant with meds   3. Hyperlipidemia - upcoming labs with pcp - she is on atorvastatin 04/2020 TC 184 TG 90 HDL 66 LDL 102   4. 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      amLODipine  (NORVASC) 10 MG tablet Take 10 mg by mouth daily.     Ascorbic Acid (VITAMIN C PO) Take 1 tablet by mouth daily.     atorvastatin (LIPITOR) 40 MG tablet Take 40 mg by mouth daily.      busPIRone (BUSPAR) 5 MG tablet 3 (three) times daily as needed.     Calcium-Magnesium-Zinc (CAL-MAG-ZINC PO) Take 1 tablet by mouth daily.      FLUoxetine (PROZAC) 40 MG capsule Take 40 mg by mouth daily.      furosemide (LASIX) 20 MG tablet TAKE ONE TABLET BY mouth daily prn 90 tablet 1   gabapentin (NEURONTIN) 100 MG capsule Take 100 mg by mouth at bedtime as needed (pain).      gabapentin (NEURONTIN) 300 MG capsule Take 300 mg by mouth at bedtime as needed (pain).      meloxicam (MOBIC) 15 MG tablet Take 1 tablet (15 mg total) by mouth daily. (Patient taking differently: Take 15 mg by mouth daily as needed.) 90 tablet 3   mirtazapine (REMERON) 15 MG tablet Take 15 mg by mouth at bedtime.     Multiple Vitamin (MULTIVITAMIN) tablet Take 1 tablet by mouth daily.     nitroGLYCERIN (NITROSTAT) 0.4 MG SL tablet  Place 0.4 mg under the tongue every 5 (five) minutes as needed for chest pain.     omeprazole (PRILOSEC) 20 MG capsule TAKE 1 CAPSULE BY MOUTH DAILY BEFORE A MEAL. (Patient taking differently: Take 20 mg by mouth daily.) 90 capsule 3   propranolol (INDERAL) 80 MG tablet Take 1 tablet (80 mg total) by mouth 2 (two) times daily. 180 tablet 1   tiZANidine (ZANAFLEX) 2 MG tablet Take 1-2 tablets by mouth every 8 (eight) hours as needed.     topiramate (TOPAMAX) 25 MG tablet at bedtime.     traMADol (ULTRAM) 50 MG tablet Take 50-100 mg by mouth every 6 (six) hours as needed.     traZODone (DESYREL) 50 MG tablet Take 25 mg by mouth at bedtime as needed for sleep.      No current facility-administered medications for this visit.     Past Surgical History:  Procedure Laterality Date   BIOPSY  02/09/2015   Procedure: GASTRIC BIOPSIES ;  Surgeon: Danie Binder, MD;  Location: [facility];  Service: Endoscopy;;   BIOPSY   02/24/2020   Procedure: BIOPSY;  Surgeon: Eloise Harman, DO;  [facility];  Service: Endoscopy;;   COLONOSCOPY WITH PROPOFOL N/A 02/09/2015   SLF: 1. the examined terminal ileum appeared to be normal 2. the colonic mucosa appeared normal 3. small internal hemorrhoids   ESOPHAGOGASTRODUODENOSCOPY (EGD) WITH PROPOFOL N/A 02/09/2015   SLF: 1. stricture at the gastroesophageal junction 2. mild non-erosive gastritis    ESOPHAGOGASTRODUODENOSCOPY (EGD) WITH PROPOFOL N/A 02/24/2020   Procedure: ESOPHAGOGASTRODUODENOSCOPY (EGD) WITH PROPOFOL;  Surgeon: Eloise Harman, DO;  Gastritis s/p biopsy (nonspecific reactive gastropathy), normal examined duodenum s/p biopsy (benign).   GIVENS CAPSULE STUDY N/A 10/19/2016   Procedure: GIVENS CAPSULE STUDY;  Surgeon: Danie Binder, MD;  [facility];  Service: Endoscopy;  Laterality: N/A;  7:30am, pt to arrive at 7:00am   left arm     ? nerve repaired, " it was pinched".   left elbow surgery Left    SAVORY DILATION N/A 02/09/2015   Procedure: SAVORY DILATION 12.18mm, 39mm, 72mm, 50mm;  Surgeon: Danie Binder, MD;  Location[facility];  Service: Endoscopy;  Laterality: N/A;   TONSILLECTOMY     TUBAL LIGATION       Allergies  Allergen Reactions   Asa [Aspirin] Other (See Comments)    REACTION: Stomach upset   Ibuprofen     STOMACH UPSET      Family History  Problem Relation Age of Onset   Heart failure Father    Hypertension Father    Stroke Father    Colon cancer Father        greater than age 38   Cancer Father    Dementia Father    Cancer Mother    Seizures Sister    Inflammatory bowel disease Neg Hx    Liver disease Neg Hx      Social History [name] reports that she has been smoking cigarettes. She started smoking about 48 years ago. She has a 10.50 pack-year smoking history. She has never used smokeless tobacco. [name] reports no history of alcohol use.   Review of Systems CONSTITUTIONAL: No weight  loss, fever, chills, weakness or fatigue.  HEENT: Eyes: No visual loss, blurred vision, double vision or yellow sclerae.No hearing loss, sneezing, congestion, runny nose or sore throat.  SKIN: No rash or itching.  CARDIOVASCULAR: per hpi RESPIRATORY: No shortness of breath, cough or  sputum.  GASTROINTESTINAL: No anorexia, nausea, vomiting or diarrhea. No abdominal pain or blood.  GENITOURINARY: No burning on urination, no polyuria NEUROLOGICAL: No headache, dizziness, syncope, paralysis, ataxia, numbness or tingling in the extremities. No change in bowel or bladder control.  MUSCULOSKELETAL: No muscle, back pain, joint pain or stiffness.  LYMPHATICS: No enlarged nodes. No history of splenectomy.  PSYCHIATRIC: No history of depression or anxiety.  ENDOCRINOLOGIC: No reports of sweating, cold or heat intolerance. No polyuria or polydipsia.  Marland Kitchen   Physical Examination Today's Vitals   02/11/21 1339  BP: 122/66  Pulse: 78  SpO2: 98%  Weight: 138 lb (62.6 kg)  Height: 5\' 3"  (1.6 m)   Body mass index is 24.45 kg/m.  Gen: resting comfortably, no acute distress HEENT: no scleral icterus, pupils equal round and reactive, no palptable cervical adenopathy,  CV: RRR, no m/r/g no jvd Resp: Clear to auscultation bilaterally GI: abdomen is soft, non-tender, non-distended, normal bowel sounds, no hepatosplenomegaly MSK: extremities are warm, no edema.  Skin: warm, no rash Neuro:  no focal deficits Psych: appropriate affect   Diagnostic Studies     Assessment and Plan  1. Chest pain - several year history of chest pains with negative ischemic evaluations most recently 04/2019 - infrequent chronc symptoms, continue to monitor at this time.    2. HTN - bp at goal, continue current meds   3. Hyperlipidemia -continue atorvastatin, f/u pcp labs. LDL has been at goal      Arnoldo Lenis, M.D.

## 2021-02-11 NOTE — Patient Instructions (Signed)
Medication Instructions:  Your physician recommends that you continue on your current medications as directed. Please refer to the Current Medication list given to you today.  *If you need a refill on your cardiac medications before your next appointment, please call your pharmacy*   Lab Work: None today  If you have labs (blood work) drawn today and your tests are completely normal, you will receive your results only by: MyChart Message (if you have MyChart) OR A paper copy in the mail If you have any lab test that is abnormal or we need to change your treatment, we will call you to review the results.   Testing/Procedures: None today    Follow-Up: At [facility], you and your health needs are our priority.  As part of our continuing mission to provide you with exceptional heart care, we have created designated Provider Care Teams.  These Care Teams include your primary Cardiologist (physician) and Advanced Practice Providers (APPs -  Physician Assistants and Nurse Practitioners) who all work together to provide you with the care you need, when you need it.  We recommend signing up for the patient portal called "MyChart".  Sign up information is provided on this After Visit Summary.  MyChart is used to connect with patients for Virtual Visits (Telemedicine).  Patients are able to view lab/test results, encounter notes, upcoming appointments, etc.  Non-urgent messages can be sent to your provider as well.   To learn more about what you can do with MyChart, go to [email or web address].    Your next appointment:   12 month(s)  The format for your next appointment:   In Person  Provider:   Jonathan Branch, MD   Other Instructions None   

## 2021-02-16 ENCOUNTER — Other Ambulatory Visit: Payer: Self-pay

## 2021-02-16 ENCOUNTER — Encounter: Payer: Self-pay | Admitting: Internal Medicine

## 2021-02-16 ENCOUNTER — Ambulatory Visit: Payer: Medicare Other | Admitting: Internal Medicine

## 2021-02-16 VITALS — BP 111/69 | HR 77 | Temp 97.3°F | Ht 63.0 in | Wt 138.2 lb

## 2021-02-16 DIAGNOSIS — R1031 Right lower quadrant pain: Secondary | ICD-10-CM | POA: Diagnosis not present

## 2021-02-16 DIAGNOSIS — K5900 Constipation, unspecified: Secondary | ICD-10-CM

## 2021-02-16 DIAGNOSIS — Z8619 Personal history of other infectious and parasitic diseases: Secondary | ICD-10-CM | POA: Diagnosis not present

## 2021-02-16 DIAGNOSIS — K219 Gastro-esophageal reflux disease without esophagitis: Secondary | ICD-10-CM

## 2021-02-16 NOTE — Patient Instructions (Signed)
For your constipation, I recommend taking 1 capful of MiraLAX (polyethylene glycol) once every morning.  If this is not adequate you can go to 1 capful twice daily.  If this is too much than you can go to 1 capful every other day.  I hope once your bowels start moving better, your abdominal pain will also improve.  Follow-up in 3 months.  It was great seeing you today.  Dr. Abbey Chatters   At [facility] we value your feedback. You may receive a survey about your visit today. Please share your experience as we strive to create trusting relationships with our patients to provide genuine, compassionate, quality care.  We appreciate your understanding and patience as we review any laboratory studies, imaging, and other diagnostic tests that are ordered as we care for you. Our office policy is 5 business days for review of these results, and any emergent or urgent results are addressed in a timely manner for your best interest. If you do not hear from our office in 1 week, please contact us.   We also encourage the use of MyChart, which contains your medical information for your review as well. If you are not enrolled in this feature, an access code is on this after visit summary for your convenience. Thank you for allowing [place] to be involved in your care.  It was great to see you today!  I hope you have a great rest of your summer!!    Elon Alas. Abbey Chatters, D.O. Gastroenterology and Hepatology [facility]

## 2021-03-01 ENCOUNTER — Ambulatory Visit (HOSPITAL_COMMUNITY)
Admission: RE | Admit: 2021-03-01 | Discharge: 2021-03-01 | Disposition: A | Discharge status: Home / Self Care | Payer: Medicare Other | Source: Ambulatory Visit | Attending: Family Medicine | Admitting: Family Medicine

## 2021-03-01 ENCOUNTER — Other Ambulatory Visit (HOSPITAL_COMMUNITY): Payer: Self-pay | Admitting: Family Medicine

## 2021-03-01 ENCOUNTER — Other Ambulatory Visit: Payer: Self-pay

## 2021-03-01 DIAGNOSIS — R109 Unspecified abdominal pain: Secondary | ICD-10-CM | POA: Diagnosis not present

## 2021-03-01 DIAGNOSIS — R600 Localized edema: Secondary | ICD-10-CM | POA: Diagnosis not present

## 2021-03-01 DIAGNOSIS — G894 Chronic pain syndrome: Secondary | ICD-10-CM | POA: Diagnosis not present

## 2021-03-09 DIAGNOSIS — K219 Gastro-esophageal reflux disease without esophagitis: Secondary | ICD-10-CM | POA: Diagnosis not present

## 2021-03-10 DIAGNOSIS — K5900 Constipation, unspecified: Secondary | ICD-10-CM | POA: Insufficient documentation
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
## 2021-03-16 DIAGNOSIS — M5412 Radiculopathy, cervical region: Secondary | ICD-10-CM | POA: Diagnosis not present

## 2021-03-16 DIAGNOSIS — L282 Other prurigo: Secondary | ICD-10-CM | POA: Diagnosis not present

## 2021-03-31 DIAGNOSIS — M25519 Pain in unspecified shoulder: Secondary | ICD-10-CM | POA: Diagnosis not present

## 2021-03-31 DIAGNOSIS — G5603 Carpal tunnel syndrome, bilateral upper limbs: Secondary | ICD-10-CM | POA: Diagnosis not present

## 2021-03-31 DIAGNOSIS — G4700 Insomnia, unspecified: Secondary | ICD-10-CM | POA: Diagnosis not present

## 2021-03-31 DIAGNOSIS — G4489 Other headache syndrome: Secondary | ICD-10-CM | POA: Diagnosis not present

## 2021-03-31 DIAGNOSIS — M545 Low back pain, unspecified: Secondary | ICD-10-CM | POA: Diagnosis not present

## 2021-03-31 DIAGNOSIS — M533 Sacrococcygeal disorders, not elsewhere classified: Secondary | ICD-10-CM | POA: Diagnosis not present

## 2021-03-31 DIAGNOSIS — I10 Essential (primary) hypertension: Secondary | ICD-10-CM | POA: Diagnosis not present

## 2021-03-31 DIAGNOSIS — M79643 Pain in unspecified hand: Secondary | ICD-10-CM | POA: Diagnosis not present

## 2021-03-31 DIAGNOSIS — M542 Cervicalgia: Secondary | ICD-10-CM | POA: Diagnosis not present

## 2021-03-31 DIAGNOSIS — M25551 Pain in right hip: Secondary | ICD-10-CM | POA: Diagnosis not present

## 2021-03-31 DIAGNOSIS — M5412 Radiculopathy, cervical region: Secondary | ICD-10-CM | POA: Diagnosis not present

## 2021-04-08 DIAGNOSIS — K219 Gastro-esophageal reflux disease without esophagitis: Secondary | ICD-10-CM | POA: Diagnosis not present

## 2021-05-05 DIAGNOSIS — G5603 Carpal tunnel syndrome, bilateral upper limbs: Secondary | ICD-10-CM | POA: Diagnosis not present

## 2021-05-05 DIAGNOSIS — M25519 Pain in unspecified shoulder: Secondary | ICD-10-CM | POA: Diagnosis not present

## 2021-05-05 DIAGNOSIS — M5416 Radiculopathy, lumbar region: Secondary | ICD-10-CM | POA: Diagnosis not present

## 2021-05-05 DIAGNOSIS — I10 Essential (primary) hypertension: Secondary | ICD-10-CM | POA: Diagnosis not present

## 2021-05-05 DIAGNOSIS — M79643 Pain in unspecified hand: Secondary | ICD-10-CM | POA: Diagnosis not present

## 2021-05-05 DIAGNOSIS — M542 Cervicalgia: Secondary | ICD-10-CM | POA: Diagnosis not present

## 2021-05-05 DIAGNOSIS — M545 Low back pain, unspecified: Secondary | ICD-10-CM | POA: Diagnosis not present

## 2021-05-05 DIAGNOSIS — G4700 Insomnia, unspecified: Secondary | ICD-10-CM | POA: Diagnosis not present

## 2021-05-05 DIAGNOSIS — G4489 Other headache syndrome: Secondary | ICD-10-CM | POA: Diagnosis not present

## 2021-05-05 DIAGNOSIS — M25551 Pain in right hip: Secondary | ICD-10-CM | POA: Diagnosis not present

## 2021-05-05 DIAGNOSIS — M533 Sacrococcygeal disorders, not elsewhere classified: Secondary | ICD-10-CM | POA: Diagnosis not present

## 2021-05-06 DIAGNOSIS — I10 Essential (primary) hypertension: Secondary | ICD-10-CM | POA: Diagnosis not present

## 2021-05-09 DIAGNOSIS — K219 Gastro-esophageal reflux disease without esophagitis: Secondary | ICD-10-CM | POA: Diagnosis not present

## 2021-05-13 DIAGNOSIS — I2510 Atherosclerotic heart disease of native coronary artery without angina pectoris: Secondary | ICD-10-CM | POA: Diagnosis not present

## 2021-05-13 DIAGNOSIS — Z23 Encounter for immunization: Secondary | ICD-10-CM | POA: Diagnosis not present

## 2021-05-13 DIAGNOSIS — J449 Chronic obstructive pulmonary disease, unspecified: Secondary | ICD-10-CM | POA: Diagnosis not present

## 2021-05-13 DIAGNOSIS — K219 Gastro-esophageal reflux disease without esophagitis: Secondary | ICD-10-CM | POA: Diagnosis not present

## 2021-05-13 DIAGNOSIS — I10 Essential (primary) hypertension: Secondary | ICD-10-CM | POA: Diagnosis not present

## 2021-05-13 DIAGNOSIS — I739 Peripheral vascular disease, unspecified: Secondary | ICD-10-CM | POA: Diagnosis not present

## 2021-05-13 DIAGNOSIS — D509 Iron deficiency anemia, unspecified: Secondary | ICD-10-CM | POA: Diagnosis not present

## 2021-05-13 DIAGNOSIS — G43909 Migraine, unspecified, not intractable, without status migrainosus: Secondary | ICD-10-CM | POA: Diagnosis not present

## 2021-05-13 DIAGNOSIS — R945 Abnormal results of liver function studies: Secondary | ICD-10-CM | POA: Diagnosis not present

## 2021-05-13 DIAGNOSIS — E782 Mixed hyperlipidemia: Secondary | ICD-10-CM | POA: Diagnosis not present

## 2021-05-13 DIAGNOSIS — Z0001 Encounter for general adult medical examination with abnormal findings: Secondary | ICD-10-CM | POA: Diagnosis not present

## 2021-05-31 DIAGNOSIS — G5603 Carpal tunnel syndrome, bilateral upper limbs: Secondary | ICD-10-CM | POA: Diagnosis not present

## 2021-05-31 DIAGNOSIS — M79643 Pain in unspecified hand: Secondary | ICD-10-CM | POA: Diagnosis not present

## 2021-05-31 DIAGNOSIS — M545 Low back pain, unspecified: Secondary | ICD-10-CM | POA: Diagnosis not present

## 2021-05-31 DIAGNOSIS — G4489 Other headache syndrome: Secondary | ICD-10-CM | POA: Diagnosis not present

## 2021-05-31 DIAGNOSIS — M25551 Pain in right hip: Secondary | ICD-10-CM | POA: Diagnosis not present

## 2021-05-31 DIAGNOSIS — M542 Cervicalgia: Secondary | ICD-10-CM | POA: Diagnosis not present

## 2021-05-31 DIAGNOSIS — M25519 Pain in unspecified shoulder: Secondary | ICD-10-CM | POA: Diagnosis not present

## 2021-05-31 DIAGNOSIS — I10 Essential (primary) hypertension: Secondary | ICD-10-CM | POA: Diagnosis not present

## 2021-05-31 DIAGNOSIS — M5416 Radiculopathy, lumbar region: Secondary | ICD-10-CM | POA: Diagnosis not present

## 2021-05-31 DIAGNOSIS — M533 Sacrococcygeal disorders, not elsewhere classified: Secondary | ICD-10-CM | POA: Diagnosis not present

## 2021-05-31 DIAGNOSIS — G4700 Insomnia, unspecified: Secondary | ICD-10-CM | POA: Diagnosis not present

## 2021-06-08 DIAGNOSIS — K219 Gastro-esophageal reflux disease without esophagitis: Secondary | ICD-10-CM | POA: Diagnosis not present

## 2021-06-19 NOTE — Progress Notes (Signed)
Referring Provider: Celene Squibb, MD Primary Care Physician:  Celene Squibb, MD Primary GI Physician: Dr. Abbey Chatters  Chief Complaint  Patient presents with   Blood In Stools    Once last week   Gastroesophageal Reflux    ok    HPI:   Cynthia [name] is a 70 y.o. female presenting today for follow-up of GERD and constipation.   She has history of GERD, hepatitis C s/p treatment with Harvoni in 2017, F2/F3 on ultrasound with elastography prior to hep C treatment, repeat elastography February 2018 with F0/F1.  H. pylori in August 2016 with eradication confirmed August 2017.  Chronic history of anemia.  Colonoscopy August 2016 for weight loss , rectal bleeding, constipation with normal exam other than internal hemorrhoids with recommendations to repeat in 2026.  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 Pylori).  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  Again noted weight loss in 2021 with associated early satiety and decreased appetite which patient reported was due to significant stress.  EGD August 2021 with gastritis s/p biopsy (nonspecific reactive gastropathy), normal examined duodenum s/p biopsy (benign).  Also noted acute bump in LFTs in May 2021.  Hepatitis B serologies negative, HCVRNA not detected, iron panel with elevated ferritin and iron saturation.  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  LFTs were rechecked in December 2021 and had totally normalized.  She is on Ativan prior to acute elevation which was discontinued.   Last seen in our office 02/16/2021.  Reported some worsening constipation.  She was advised to start MiraLAX 1 capful daily,  increase to 2 capfuls daily if needed.  GERD is well controlled on omeprazole 20 mg daily which she was to continue.  Advise follow-up in 3 to 4 months.  Today:  GERD: GERD is well controled on omeprazole 20 mg daily. No N/V, or dysphagia.   Constipation: Bowels moving well. Taking MiraLAX every other day with soft, formed Bms every other day.   Episode of bright red blood in the stool x 1 last week. In the stool. No associated abdominal pain, rectal pain, or straining with a bowel movement. No black stool. Taking iron daily.  Denies NSAIDs.  Has been feeling a little lightheaded since last week. No pre-syncope. Some weakness and SOB, more than normal. Using nebulizer more than normal. Little cough, chronic. No fever.  Notably hypertensive today.  She took her amlodipine and 1 dose of propanolol this morning.  Having a lot of trouble with her legs.  Having a lot of pain.  States it is secondary to a pinched nerve. Taking her friends hydrocodone. Took one today. Usually taking 1 per day. Hasn't taken tramadol today.    Discussed going to the emergency room for hypertension, symptomatic.  Initially, patient was agreeable, but after discussing again, she declined.  Does not want to go to the emergency room.  Has a way of checking her blood pressure at home.  Friend dropped her off today.   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 the examined terminal ileum appeared to be normal 2. the colonic mucosa appeared normal 3. small internal hemorrhoids   ESOPHAGOGASTRODUODENOSCOPY (EGD) WITH PROPOFOL N/A 02/09/2015   SLF: 1. stricture at the gastroesophageal junction 2. mild non-erosive gastritis    ESOPHAGOGASTRODUODENOSCOPY (EGD) WITH PROPOFOL N/A 02/24/2020   Procedure: ESOPHAGOGASTRODUODENOSCOPY (EGD) WITH PROPOFOL;  Surgeon: Eloise Harman, DO;  Gastritis s/p biopsy (nonspecific reactive gastropathy), normal examined duodenum s/p biopsy (benign).   GIVENS CAPSULE STUDY N/A 10/19/2016   Procedure: GIVENS CAPSULE STUDY;  Surgeon: Danie Binder, MD;  Location: [facility];  Service: Endoscopy;  Laterality: N/A;  7:30am, pt to arrive at 7:00am   left arm     ? nerve repaired, " it was pinched".   left elbow surgery Left    SAVORY DILATION N/A 02/09/2015   Procedure: SAVORY DILATION 12.90mm, 55mm, 76mm, 53mm;  Surgeon: Danie Binder, MD;  Location: [facility];  Service: Endoscopy;  Laterality: N/A;   TONSILLECTOMY     TUBAL LIGATION      Current Outpatient Medications  Medication Sig Dispense Refill   albuterol (PROVENTIL) (2.5 MG/3ML) 0.083% nebulizer solution INHALE 1 VIAL VIA NEBULIZER EVERY 6 HOURS AS NEEDED FOR WHEEZING AND SHORTNESS OF BREATH 150 mL 1   albuterol (VENTOLIN HFA) 108 (90 Base) MCG/ACT inhaler Inhale 1 puff into the lungs every 6 (six) hours as needed for wheezing or shortness of breath.      amLODipine (NORVASC) 10 MG tablet Take 10 mg by mouth daily.     Ascorbic Acid (VITAMIN C PO) Take 1 tablet by mouth daily.     atorvastatin (LIPITOR) 40 MG tablet Take 40 mg by mouth daily.      butalbital-acetaminophen-caffeine (FIORICET)  50-325-40 MG tablet Take 1 tablet by mouth 2 (two) times daily as needed.     Calcium-Magnesium-Zinc (CAL-MAG-ZINC PO) Take 1 tablet by mouth daily.      ferrous sulfate 325 (65 FE) MG tablet Take 325 mg by mouth daily with breakfast.     furosemide (LASIX) 20 MG tablet TAKE ONE TABLET BY mouth daily prn 90 tablet 1   Multiple Vitamin (MULTIVITAMIN) tablet Take 1 tablet by mouth daily.     omeprazole (PRILOSEC) 20 MG capsule TAKE 1 CAPSULE BY MOUTH DAILY BEFORE A MEAL. (Patient taking differently: Take 20 mg by mouth as needed.) 90 capsule 3   pregabalin (LYRICA) 75 MG capsule Take 75 mg by mouth 3 (three) times daily.     propranolol (INDERAL) 80 MG tablet Take 1 tablet (80 mg total) by mouth 2 (two) times daily. 180 tablet 1   tiZANidine (ZANAFLEX) 2 MG tablet Take 1-2 tablets by  mouth every 8 (eight) hours as needed.     topiramate (TOPAMAX) 25 MG tablet Take 25 mg by mouth at bedtime.     traMADol (ULTRAM) 50 MG tablet Take 50-100 mg by mouth every 6 (six) hours as needed.     traZODone (DESYREL) 50 MG tablet Take 50-75 mg by mouth at bedtime as needed for sleep.     nitroGLYCERIN (NITROSTAT) 0.4 MG SL tablet Place 0.4 mg under the tongue every 5 (five) minutes as needed for chest pain. (Patient not taking: Reported on 06/20/2021)     No current facility-administered medications for this visit.    Allergies as of 06/20/2021 - Review Complete 06/20/2021  Allergen Reaction Noted   Asa [aspirin] Other (See Comments) 11/11/2008   Ibuprofen  05/23/2019    Family History  Problem Relation Age of Onset   Heart failure Father    Hypertension Father    Stroke Father    Colon cancer Father        greater than age 40   Cancer Father    Dementia Father    Cancer Mother    Seizures Sister    Inflammatory bowel disease Neg Hx    Liver disease Neg Hx     Social History   Socioeconomic History   Marital status: Legally Separated    Spouse name: Cheno   Number of children: 1   Years of  education: Not on file   Highest education level: Not on file  Occupational History   Occupation: disability    Comment: lung disease / COPD  Tobacco Use   Smoking status: Every Day    Packs/day: 0.25    Years: 42.00    Pack years: 10.50    Types: Cigarettes    Start date: 09/29/1972   Smokeless tobacco: Never   Tobacco comments:    about 2 cigs per day  Vaping Use   Vaping Use: Never used  Substance and Sexual Activity   Alcohol use: No    Alcohol/week: 0.0 standard drinks   Drug use: Not Currently    Types: "Crack" cocaine, Marijuana    Comment: hx of smoking crack cocaine last 5-6 years ago; 02/16/21 occ marijuana; 06/20/21 denied   Sexual activity: Never    Birth control/protection: None  Other Topics Concern   Not on file  Social History Narrative   Married to [employer] for 28 years   Keep her sister - she has seizures/disabled   Social Determinants of Radio broadcast assistant Strain: Not on file  Food Insecurity: Not on file  Transportation Needs: Not on file  Physical Activity: Not on file  Stress: Not on file  Social Connections: Not on file    Review of Systems: Gen: Denies fever, chills, cold or flulike symptoms. CV: Denies chest pain, palpitations. Resp: See HPI GI: See HPI  Heme: See HPI  Physical Exam: BP (!) 90/55   Pulse 72   Temp (!) 96.9 F (36.1 C) (Temporal)   Ht 5\' 3"  (1.6 m)   Wt 139 lb 9.6 oz (63.3 kg)   BMI 24.73 kg/m  General:   Alert and oriented. No distress noted. Pleasant and cooperative.  Head:  Normocephalic and atraumatic. Eyes:  Conjuctiva clear without scleral icterus. Heart:  S1, S2 present without murmurs appreciated. Lungs:  Clear to auscultation bilaterally. No wheezes, rales, or rhonchi. No distress.  Abdomen: +BS, soft, non-tender and non-distended. No rebound or guarding. No HSM or masses noted. Msk:  Symmetrical without  gross deformities. Normal posture. Extremities:  Without edema. Neurologic:  Alert and  oriented  x4 Psych:  Normal mood and affect.    Assessment:  70 year old female with history of GERD, H. 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  GERD:  Well-controlled on omeprazole 20 mg daily.  No dysphagia.  Constipation:  Well controlled with MiraLAX every other day.  Having soft, formed BMs every other day.  She did have an episode of bright red blood per rectum last week discussed below.  Rectal bleeding: Single episode of bright red blood in stool last week without associated hard stool, straining, rectal pain, abdominal pain.  Denies melena.  Last colonoscopy in 2016 with for rectal bleeding at that time as well revealing internal hemorrhoids and recommended repeat in 2026.  Notably, this is the first occurrence of rectal bleeding in years.  Denies NSAIDs.  Previously noted weight loss down as low as 115 pounds in August 2021, now resolved, weighing 139 pounds today.  I suspect her single episode of rectal bleeding may very well be secondary to hemorrhoids; however, cannot rule out other sources such as polyps or malignancy as she has no typical hemorrhoid symptoms.  Would consider updating a colonoscopy; however, she is hypotensive today with a blood pressure of 86/59 with associated mild lightheadedness, weakness, and increased shortness of breath compared to baseline for the  last week.  It is unclear if this could be secondary to symptomatic anemia versus other etiology.  Notably, she has also been taking her friend's hydrocodone due to leg pain.  Reports she is only been taking 1/day and has not been taking tramadol with this.    I urged her to proceed to the emergency room due to symptomatic hypotension.  She was initially agreeable, but after rechecking blood pressure and discussing again, patient declined.  Risks of not being evaluated in the emergency department were discussed, but patient again stated she was not going.  Stated she has a way of checking her blood pressure at home and would do this.  I advised she call her PCP today to discuss management of BP medications in the setting of hypotension.  Advised her not to take any blood pressure medications tonight and not to take any tomorrow if her blood pressure was not above 130/80.  I also urged her if her blood pressure stayed low, or if any of her symptoms worsen, she needed to proceed to the emergency room.  We will check a CBC today to evaluate for any significant drop in hemoglobin that may be contributing to hypotension.   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 She has maintained on iron daily. Denies NSAIDs. Last hemoglobin on file from October 2021, 11.7. She denies melena, but does report a single episode of rectal bleeding last week, discussed above. We will update her CBC today and have her continue  iron daily. Will need to consider updating a colonoscopy as this is her first occurrence of rectal bleeding in years, last colonoscopy in 2016 with internal hemorrhoids only which could be the etiology; however, due to hypotension today, unable to schedule colonoscopy.     Plan: Recommended ER evaluation of symptomatic hypotension, but patient declined.  States she has a way to check her blood pressure at home.  Advise she call Dr. Juel Burrow office today to discuss hypotension and BP medication management.  Advised her not to take any blood pressure medications tonight, and not to resume her blood pressure medications unless her blood pressure was over 130/80.  If any worsening symptoms or persistent hypertension, she is to proceed to the emergency room. Advised not to take any more hydrocodone until she has discussed with her primary care provider. CBC stat. Continue omeprazole 20 mg daily. Continue MiraLAX every other day. Continue iron daily. Continue to avoid all NSAIDs. Monitor for recurrent rectal bleeding and let me know if this occurs. Ideally, would schedule colonoscopy, but due to hypotension, unable to schedule today.  Further recommendations pending CBC.    Aliene Altes, PA-C [facility] 06/20/2021

## 2021-06-20 ENCOUNTER — Other Ambulatory Visit: Payer: Self-pay

## 2021-06-20 ENCOUNTER — Encounter: Payer: Self-pay | Admitting: Gastroenterology

## 2021-06-20 ENCOUNTER — Ambulatory Visit: Payer: Medicare Other | Admitting: Gastroenterology

## 2021-06-20 VITALS — BP 90/55 | HR 72 | Temp 96.9°F | Ht 63.0 in | Wt 139.6 lb

## 2021-06-20 DIAGNOSIS — D500 Iron deficiency anemia secondary to blood loss (chronic): Secondary | ICD-10-CM | POA: Diagnosis not present

## 2021-06-20 DIAGNOSIS — K219 Gastro-esophageal reflux disease without esophagitis: Secondary | ICD-10-CM | POA: Diagnosis not present

## 2021-06-20 DIAGNOSIS — I959 Hypotension, unspecified: Secondary | ICD-10-CM | POA: Insufficient documentation

## 2021-06-20 DIAGNOSIS — K5900 Constipation, unspecified: Secondary | ICD-10-CM

## 2021-06-20 DIAGNOSIS — K625 Hemorrhage of anus and rectum: Secondary | ICD-10-CM

## 2021-06-20 LAB — CBC WITH DIFFERENTIAL/PLATELET
Absolute Monocytes: 1382 cells/uL — ABNORMAL HIGH (ref 200–950)
Basophils Absolute: 19 cells/uL (ref 0–200)
Basophils Relative: 0.2 %
Eosinophils Absolute: 326 cells/uL (ref 15–500)
Eosinophils Relative: 3.4 %
HCT: 31.4 % — ABNORMAL LOW (ref 35.0–45.0)
Hemoglobin: 10.1 g/dL — ABNORMAL LOW (ref 11.7–15.5)
Lymphs Abs: 2390 cells/uL (ref 850–3900)
MCH: 28.3 pg (ref 27.0–33.0)
MCHC: 32.2 g/dL (ref 32.0–36.0)
MCV: 88 fL (ref 80.0–100.0)
MPV: 11.3 fL (ref 7.5–12.5)
Monocytes Relative: 14.4 %
Neutro Abs: 5482 cells/uL (ref 1500–7800)
Neutrophils Relative %: 57.1 %
Platelets: 224 10*3/uL (ref 140–400)
RBC: 3.57 10*6/uL — ABNORMAL LOW (ref 3.80–5.10)
RDW: 15 % (ref 11.0–15.0)
Total Lymphocyte: 24.9 %
WBC: 9.6 10*3/uL (ref 3.8–10.8)

## 2021-06-20 NOTE — Patient Instructions (Addendum)
As we discussed, I recommend that you proceed to [facility] emergency department for further evaluation of your low blood pressure, shortness of breath, lightheadedness, and weakness.    However, as you have declined to go to the emergency room, we will go ahead and update blood work for you.  I recommend you call Dr. Juel Burrow office today to let him know of your low blood pressure to see what he recommends regarding her blood pressure medications (amlodipine and propranolol).  I would not take any more blood pressure medications this morning.  If your blood pressure is not over 130/80, I would not recommend taking blood pressure medications tomorrow.  Your blood pressure today was 86/59.  I also recommend that you do not take any more hydrocodone until you have discussed this with your PCP.  If you have any worsening symptoms, you need to proceed to the emergency room.  Continue omeprazole 20 mg daily.  Continue MiraLAX every other day.  Continue iron daily.  Continue to avoid all NSAID products.  Monitor for recurrent rectal bleeding and let me know if this occurs.  I am sorry you are not feeling well!  Aliene Altes, PA-C [facility]

## 2021-06-22 ENCOUNTER — Other Ambulatory Visit: Payer: Self-pay | Admitting: Gastroenterology

## 2021-06-22 DIAGNOSIS — K921 Melena: Secondary | ICD-10-CM

## 2021-06-22 LAB — CBC WITH DIFFERENTIAL/PLATELET
Absolute Monocytes: 1123 cells/uL — ABNORMAL HIGH (ref 200–950)
Basophils Absolute: 19 cells/uL (ref 0–200)
Basophils Relative: 0.2 %
Eosinophils Absolute: 240 cells/uL (ref 15–500)
Eosinophils Relative: 2.5 %
HCT: 33 % — ABNORMAL LOW (ref 35.0–45.0)
Hemoglobin: 10.7 g/dL — ABNORMAL LOW (ref 11.7–15.5)
Lymphs Abs: 2458 cells/uL (ref 850–3900)
MCH: 27.9 pg (ref 27.0–33.0)
MCHC: 32.4 g/dL (ref 32.0–36.0)
MCV: 85.9 fL (ref 80.0–100.0)
MPV: 11.3 fL (ref 7.5–12.5)
Monocytes Relative: 11.7 %
Neutro Abs: 5760 cells/uL (ref 1500–7800)
Neutrophils Relative %: 60 %
Platelets: 273 10*3/uL (ref 140–400)
RBC: 3.84 10*6/uL (ref 3.80–5.10)
RDW: 15.3 % — ABNORMAL HIGH (ref 11.0–15.0)
Total Lymphocyte: 25.6 %
WBC: 9.6 10*3/uL (ref 3.8–10.8)

## 2021-06-23 ENCOUNTER — Encounter: Payer: Self-pay | Admitting: Internal Medicine

## 2021-07-08 ENCOUNTER — Other Ambulatory Visit: Payer: Self-pay

## 2021-07-08 ENCOUNTER — Encounter (HOSPITAL_COMMUNITY): Payer: Self-pay

## 2021-07-08 ENCOUNTER — Inpatient Hospital Stay (HOSPITAL_COMMUNITY)
Admission: EM | Admit: 2021-07-08 | Discharge: 2021-08-10 | DRG: 004 | Disposition: E | Payer: Medicare Other | Attending: Emergency Medicine | Admitting: Emergency Medicine

## 2021-07-08 ENCOUNTER — Emergency Department (HOSPITAL_COMMUNITY): Payer: Medicare Other

## 2021-07-08 DIAGNOSIS — G43909 Migraine, unspecified, not intractable, without status migrainosus: Secondary | ICD-10-CM | POA: Diagnosis present

## 2021-07-08 DIAGNOSIS — J181 Lobar pneumonia, unspecified organism: Secondary | ICD-10-CM | POA: Diagnosis present

## 2021-07-08 DIAGNOSIS — F1411 Cocaine abuse, in remission: Secondary | ICD-10-CM | POA: Diagnosis present

## 2021-07-08 DIAGNOSIS — R0689 Other abnormalities of breathing: Secondary | ICD-10-CM | POA: Diagnosis not present

## 2021-07-08 DIAGNOSIS — J9501 Hemorrhage from tracheostomy stoma: Secondary | ICD-10-CM | POA: Diagnosis not present

## 2021-07-08 DIAGNOSIS — C3492 Malignant neoplasm of unspecified part of left bronchus or lung: Secondary | ICD-10-CM | POA: Diagnosis present

## 2021-07-08 DIAGNOSIS — I110 Hypertensive heart disease with heart failure: Secondary | ICD-10-CM | POA: Diagnosis present

## 2021-07-08 DIAGNOSIS — I5033 Acute on chronic diastolic (congestive) heart failure: Secondary | ICD-10-CM | POA: Diagnosis present

## 2021-07-08 DIAGNOSIS — R578 Other shock: Secondary | ICD-10-CM | POA: Diagnosis not present

## 2021-07-08 DIAGNOSIS — I10 Essential (primary) hypertension: Secondary | ICD-10-CM | POA: Diagnosis not present

## 2021-07-08 DIAGNOSIS — F4310 Post-traumatic stress disorder, unspecified: Secondary | ICD-10-CM | POA: Diagnosis present

## 2021-07-08 DIAGNOSIS — J9601 Acute respiratory failure with hypoxia: Secondary | ICD-10-CM | POA: Diagnosis not present

## 2021-07-08 DIAGNOSIS — E870 Hyperosmolality and hypernatremia: Secondary | ICD-10-CM | POA: Diagnosis not present

## 2021-07-08 DIAGNOSIS — K922 Gastrointestinal hemorrhage, unspecified: Secondary | ICD-10-CM | POA: Diagnosis not present

## 2021-07-08 DIAGNOSIS — A419 Sepsis, unspecified organism: Principal | ICD-10-CM | POA: Diagnosis present

## 2021-07-08 DIAGNOSIS — Z66 Do not resuscitate: Secondary | ICD-10-CM | POA: Diagnosis not present

## 2021-07-08 DIAGNOSIS — I6340 Cerebral infarction due to embolism of unspecified cerebral artery: Secondary | ICD-10-CM | POA: Diagnosis present

## 2021-07-08 DIAGNOSIS — D62 Acute posthemorrhagic anemia: Secondary | ICD-10-CM | POA: Diagnosis not present

## 2021-07-08 DIAGNOSIS — J80 Acute respiratory distress syndrome: Secondary | ICD-10-CM

## 2021-07-08 DIAGNOSIS — J440 Chronic obstructive pulmonary disease with acute lower respiratory infection: Secondary | ICD-10-CM | POA: Diagnosis not present

## 2021-07-08 DIAGNOSIS — R531 Weakness: Secondary | ICD-10-CM | POA: Diagnosis not present

## 2021-07-08 DIAGNOSIS — T380X5A Adverse effect of glucocorticoids and synthetic analogues, initial encounter: Secondary | ICD-10-CM | POA: Diagnosis not present

## 2021-07-08 DIAGNOSIS — Z930 Tracheostomy status: Secondary | ICD-10-CM

## 2021-07-08 DIAGNOSIS — I952 Hypotension due to drugs: Secondary | ICD-10-CM | POA: Diagnosis present

## 2021-07-08 DIAGNOSIS — R4701 Aphasia: Secondary | ICD-10-CM | POA: Diagnosis not present

## 2021-07-08 DIAGNOSIS — G9341 Metabolic encephalopathy: Secondary | ICD-10-CM | POA: Diagnosis not present

## 2021-07-08 DIAGNOSIS — K219 Gastro-esophageal reflux disease without esophagitis: Secondary | ICD-10-CM | POA: Diagnosis present

## 2021-07-08 DIAGNOSIS — Z8249 Family history of ischemic heart disease and other diseases of the circulatory system: Secondary | ICD-10-CM

## 2021-07-08 DIAGNOSIS — J690 Pneumonitis due to inhalation of food and vomit: Secondary | ICD-10-CM | POA: Diagnosis not present

## 2021-07-08 DIAGNOSIS — R197 Diarrhea, unspecified: Secondary | ICD-10-CM

## 2021-07-08 DIAGNOSIS — Z515 Encounter for palliative care: Secondary | ICD-10-CM

## 2021-07-08 DIAGNOSIS — C3491 Malignant neoplasm of unspecified part of right bronchus or lung: Secondary | ICD-10-CM | POA: Diagnosis present

## 2021-07-08 DIAGNOSIS — I63413 Cerebral infarction due to embolism of bilateral middle cerebral arteries: Secondary | ICD-10-CM | POA: Diagnosis present

## 2021-07-08 DIAGNOSIS — D72829 Elevated white blood cell count, unspecified: Secondary | ICD-10-CM | POA: Diagnosis not present

## 2021-07-08 DIAGNOSIS — E782 Mixed hyperlipidemia: Secondary | ICD-10-CM | POA: Diagnosis present

## 2021-07-08 DIAGNOSIS — J9621 Acute and chronic respiratory failure with hypoxia: Secondary | ICD-10-CM

## 2021-07-08 DIAGNOSIS — E861 Hypovolemia: Secondary | ICD-10-CM | POA: Diagnosis present

## 2021-07-08 DIAGNOSIS — J189 Pneumonia, unspecified organism: Secondary | ICD-10-CM | POA: Diagnosis not present

## 2021-07-08 DIAGNOSIS — J9690 Respiratory failure, unspecified, unspecified whether with hypoxia or hypercapnia: Secondary | ICD-10-CM

## 2021-07-08 DIAGNOSIS — F17210 Nicotine dependence, cigarettes, uncomplicated: Secondary | ICD-10-CM | POA: Diagnosis present

## 2021-07-08 DIAGNOSIS — I639 Cerebral infarction, unspecified: Secondary | ICD-10-CM

## 2021-07-08 DIAGNOSIS — Z0189 Encounter for other specified special examinations: Secondary | ICD-10-CM

## 2021-07-08 DIAGNOSIS — Z20822 Contact with and (suspected) exposure to covid-19: Secondary | ICD-10-CM | POA: Diagnosis not present

## 2021-07-08 DIAGNOSIS — R0902 Hypoxemia: Secondary | ICD-10-CM

## 2021-07-08 DIAGNOSIS — C3490 Malignant neoplasm of unspecified part of unspecified bronchus or lung: Secondary | ICD-10-CM | POA: Diagnosis present

## 2021-07-08 DIAGNOSIS — E876 Hypokalemia: Secondary | ICD-10-CM | POA: Diagnosis not present

## 2021-07-08 DIAGNOSIS — Z978 Presence of other specified devices: Secondary | ICD-10-CM

## 2021-07-08 DIAGNOSIS — Z4659 Encounter for fitting and adjustment of other gastrointestinal appliance and device: Secondary | ICD-10-CM

## 2021-07-08 DIAGNOSIS — J449 Chronic obstructive pulmonary disease, unspecified: Secondary | ICD-10-CM | POA: Diagnosis present

## 2021-07-08 DIAGNOSIS — T4275XA Adverse effect of unspecified antiepileptic and sedative-hypnotic drugs, initial encounter: Secondary | ICD-10-CM | POA: Diagnosis present

## 2021-07-08 DIAGNOSIS — Z823 Family history of stroke: Secondary | ICD-10-CM

## 2021-07-08 DIAGNOSIS — T17908A Unspecified foreign body in respiratory tract, part unspecified causing other injury, initial encounter: Secondary | ICD-10-CM | POA: Diagnosis present

## 2021-07-08 DIAGNOSIS — R0602 Shortness of breath: Secondary | ICD-10-CM | POA: Diagnosis not present

## 2021-07-08 DIAGNOSIS — K921 Melena: Secondary | ICD-10-CM | POA: Diagnosis not present

## 2021-07-08 DIAGNOSIS — E46 Unspecified protein-calorie malnutrition: Secondary | ICD-10-CM | POA: Diagnosis present

## 2021-07-08 DIAGNOSIS — Y848 Other medical procedures as the cause of abnormal reaction of the patient, or of later complication, without mention of misadventure at the time of the procedure: Secondary | ICD-10-CM | POA: Diagnosis not present

## 2021-07-08 DIAGNOSIS — E871 Hypo-osmolality and hyponatremia: Secondary | ICD-10-CM | POA: Diagnosis not present

## 2021-07-08 DIAGNOSIS — R29708 NIHSS score 8: Secondary | ICD-10-CM | POA: Diagnosis present

## 2021-07-08 DIAGNOSIS — Z743 Need for continuous supervision: Secondary | ICD-10-CM | POA: Diagnosis not present

## 2021-07-08 DIAGNOSIS — E8809 Other disorders of plasma-protein metabolism, not elsewhere classified: Secondary | ICD-10-CM | POA: Diagnosis not present

## 2021-07-08 DIAGNOSIS — R1310 Dysphagia, unspecified: Secondary | ICD-10-CM | POA: Diagnosis not present

## 2021-07-08 DIAGNOSIS — R000 Tachycardia, unspecified: Secondary | ICD-10-CM | POA: Diagnosis not present

## 2021-07-08 DIAGNOSIS — N179 Acute kidney failure, unspecified: Secondary | ICD-10-CM | POA: Diagnosis not present

## 2021-07-08 DIAGNOSIS — Z800 Family history of malignant neoplasm of digestive organs: Secondary | ICD-10-CM

## 2021-07-08 DIAGNOSIS — I517 Cardiomegaly: Secondary | ICD-10-CM | POA: Diagnosis not present

## 2021-07-08 DIAGNOSIS — L8990 Pressure ulcer of unspecified site, unspecified stage: Secondary | ICD-10-CM | POA: Insufficient documentation

## 2021-07-08 DIAGNOSIS — Z79899 Other long term (current) drug therapy: Secondary | ICD-10-CM

## 2021-07-08 DIAGNOSIS — E860 Dehydration: Secondary | ICD-10-CM | POA: Diagnosis present

## 2021-07-08 DIAGNOSIS — F419 Anxiety disorder, unspecified: Secondary | ICD-10-CM | POA: Diagnosis present

## 2021-07-08 DIAGNOSIS — G894 Chronic pain syndrome: Secondary | ICD-10-CM | POA: Diagnosis present

## 2021-07-08 DIAGNOSIS — R112 Nausea with vomiting, unspecified: Secondary | ICD-10-CM | POA: Diagnosis not present

## 2021-07-08 DIAGNOSIS — Z781 Physical restraint status: Secondary | ICD-10-CM

## 2021-07-08 DIAGNOSIS — R9431 Abnormal electrocardiogram [ECG] [EKG]: Secondary | ICD-10-CM

## 2021-07-08 DIAGNOSIS — R739 Hyperglycemia, unspecified: Secondary | ICD-10-CM | POA: Diagnosis not present

## 2021-07-08 LAB — CBC
HCT: 33.9 % — ABNORMAL LOW (ref 36.0–46.0)
Hemoglobin: 11.3 g/dL — ABNORMAL LOW (ref 12.0–15.0)
MCH: 28.8 pg (ref 26.0–34.0)
MCHC: 33.3 g/dL (ref 30.0–36.0)
MCV: 86.5 fL (ref 80.0–100.0)
Platelets: 239 10*3/uL (ref 150–400)
RBC: 3.92 MIL/uL (ref 3.87–5.11)
RDW: 16.5 % — ABNORMAL HIGH (ref 11.5–15.5)
WBC: 16.7 10*3/uL — ABNORMAL HIGH (ref 4.0–10.5)
nRBC: 0 % (ref 0.0–0.2)

## 2021-07-08 LAB — COMPREHENSIVE METABOLIC PANEL
ALT: 29 U/L (ref 0–44)
AST: 64 U/L — ABNORMAL HIGH (ref 15–41)
Albumin: 3.3 g/dL — ABNORMAL LOW (ref 3.5–5.0)
Alkaline Phosphatase: 62 U/L (ref 38–126)
Anion gap: 13 (ref 5–15)
BUN: 18 mg/dL (ref 8–23)
CO2: 17 mmol/L — ABNORMAL LOW (ref 22–32)
Calcium: 9.2 mg/dL (ref 8.9–10.3)
Chloride: 109 mmol/L (ref 98–111)
Creatinine, Ser: 1.14 mg/dL — ABNORMAL HIGH (ref 0.44–1.00)
GFR, Estimated: 52 mL/min — ABNORMAL LOW (ref >60)
Glucose, Bld: 143 mg/dL — ABNORMAL HIGH (ref 70–99)
Potassium: 2.9 mmol/L — ABNORMAL LOW (ref 3.5–5.1)
Sodium: 139 mmol/L (ref 135–145)
Total Bilirubin: 1 mg/dL (ref 0.3–1.2)
Total Protein: 8.7 g/dL — ABNORMAL HIGH (ref 6.5–8.1)

## 2021-07-08 LAB — RESP PANEL BY RT-PCR (FLU A&B, COVID) ARPGX2
Influenza A by PCR: NEGATIVE
Influenza B by PCR: NEGATIVE
SARS Coronavirus 2 by RT PCR: NEGATIVE

## 2021-07-08 LAB — LACTIC ACID, PLASMA: Lactic Acid, Venous: 1.6 mmol/L (ref 0.5–1.9)

## 2021-07-08 LAB — CBG MONITORING, ED: Glucose-Capillary: 144 mg/dL — ABNORMAL HIGH (ref 70–99)

## 2021-07-08 LAB — PROTIME-INR
INR: 1.2 (ref 0.8–1.2)
Prothrombin Time: 15.5 seconds — ABNORMAL HIGH (ref 11.4–15.2)

## 2021-07-08 LAB — APTT: aPTT: 36 seconds (ref 24–36)

## 2021-07-08 MED ORDER — IPRATROPIUM-ALBUTEROL 0.5-2.5 (3) MG/3ML IN SOLN
3.0000 mL | Freq: Once | RESPIRATORY_TRACT | Status: AC
  Administered 2021-07-08: 22:00:00 3 mL via RESPIRATORY_TRACT
  Filled 2021-07-08: qty 3

## 2021-07-08 MED ORDER — SODIUM CHLORIDE 0.9 % IV BOLUS (SEPSIS)
1000.0000 mL | Freq: Once | INTRAVENOUS | Status: AC
  Administered 2021-07-08: 22:00:00 1000 mL via INTRAVENOUS

## 2021-07-08 MED ORDER — METOCLOPRAMIDE HCL 5 MG/ML IJ SOLN
10.0000 mg | Freq: Once | INTRAMUSCULAR | Status: AC
  Administered 2021-07-08: 22:00:00 10 mg via INTRAVENOUS
  Filled 2021-07-08: qty 2

## 2021-07-08 MED ORDER — SODIUM CHLORIDE 0.9 % IV SOLN
500.0000 mg | INTRAVENOUS | Status: DC
  Administered 2021-07-08: 22:00:00 500 mg via INTRAVENOUS
  Filled 2021-07-08: qty 5

## 2021-07-08 MED ORDER — SODIUM CHLORIDE 0.9 % IV SOLN
2.0000 g | INTRAVENOUS | Status: DC
  Administered 2021-07-08: 22:00:00 2 g via INTRAVENOUS
  Filled 2021-07-08: qty 20

## 2021-07-08 MED ORDER — LACTATED RINGERS IV SOLN: INTRAVENOUS | Status: AC

## 2021-07-08 NOTE — Sepsis Progress Note (Signed)
Monitoring for the code sepsis protocol. °

## 2021-07-08 NOTE — H&P (Addendum)
History and Physical  Cynthia [name] NGE:[id number] DOB: [birth year] DOA: 06/28/2021  Referring physician: Noemi Chapel, MD PCP: Celene Squibb, MD  Patient coming from: Home  Chief Complaint: Shortness of breath, generalized weakness  HPI: Cynthia [name] is a 70 y.o. female with medical history significant for essential hypertension, COPD, GERD, hyperlipidemia, anemia, migraine and tobacco abuse who presents to the emergency department due to 2-day onset of increasing shortness of breath which worsens with exertion, this was associated with nausea, vomiting and diarrhea.  Vomitus was nonbloody, diarrhea was watery and black in nature.  Patient states that she has not been able to tolerate any oral intake since onset of symptoms due to vomiting.  She complained of worsening weakness.  Daughter at bedside states that patient looks much worse than she usually does.  Patient only had minimal relief with albuterol treatment at home.  EMS was activated and on arrival of EMS team, patient was noted to have O2 sat of 89% on room air, she was placed on supplemental oxygen via [place] at 3 LPM with improvement in O2 sat to 92% and patient was taken to the ED for further evaluation and management.  She complained of headache and states that she has not been able to take her medication due to nausea and vomiting since onset of symptoms.  Patient lives alone and was independent of ADL at baseline.  ED Course:  In the emergency department, she was febrile, tachycardic, tachypneic and hypoxic despite supplemental oxygen at 6 LPM.  Work-up in the ED showed normal CBC except for leukocytosis at 16.7, BMP showed hypokalemia, BUN/Cr 18/1.14 (baseline creatinine at 1.1-1.3), lactic acid x2 was negative, FOBT was positive. Chest x-ray showed Interstitial and patchy hazy opacities of the left mid to lower lung field, most likely due to pneumonia She was started on IV ceftriaxone and azithromycin, breathing treatment was  provided, Reglan was given and IV hydration was provided.  Hospitalist was asked to admit patient for further evaluation and management. At bedside, patient was in acute respiratory distress, she could only complete a sentence without being short of breath.  Review of Systems: Constitutional: Positive for chills and fever.  HENT: Negative for ear pain and sore throat.   Eyes: Negative for pain and visual disturbance.  Respiratory: Positive for shortness of breath and cough Cardiovascular: Negative for chest pain and palpitations.  Gastrointestinal: Positive for nausea, vomiting, diarrhea.  Negative for abdominal pain Endocrine: Negative for polyphagia and polyuria.  Genitourinary: Negative for decreased urine volume, dysuria, enuresis Musculoskeletal: Negative for arthralgias and back pain.  Skin: Negative for color change and rash.  Allergic/Immunologic: Negative for immunocompromised state.  Neurological: Negative for tremors, syncope, speech difficulty, weakness, light-headedness and headaches.  Hematological: Does not bruise/bleed easily.  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 the examined terminal ileum appeared to be normal 2. the colonic mucosa appeared normal 3. small internal hemorrhoids   ESOPHAGOGASTRODUODENOSCOPY (EGD) WITH PROPOFOL N/A 02/09/2015   SLF: 1. stricture at the gastroesophageal junction 2. mild non-erosive gastritis    ESOPHAGOGASTRODUODENOSCOPY (EGD) WITH PROPOFOL N/A 02/24/2020   Procedure: ESOPHAGOGASTRODUODENOSCOPY (EGD) WITH PROPOFOL;  Surgeon: Eloise Harman, DO;  Gastritis s/p biopsy (nonspecific reactive gastropathy), normal examined duodenum s/p biopsy (benign).   GIVENS CAPSULE STUDY N/A 10/19/2016   Procedure: GIVENS CAPSULE STUDY;  Surgeon: Danie Binder, MD;  Location: [facility];  Service: Endoscopy;  Laterality: N/A;  7:30am, pt to arrive at 7:00am   left arm     ? nerve repaired, " it was pinched".   left elbow surgery Left    SAVORY DILATION N/A 02/09/2015   Procedure: SAVORY DILATION 12.25m, 159m [id number]: SandDanie Binder;  Location: [facility];  Service: Endoscopy;  Laterality: N/A;   TONSILLECTOMY     TUBAL LIGATION      Social History:  reports that she has been smoking cigarettes. She started smoking about 48 years ago. She has a 10.50 pack-year smoking history. She has never used smokeless tobacco. She reports that she does not currently use drugs after having used the following drugs: "Crack" cocaine and Marijuana. She reports that she does not drink alcohol.   Allergies  Allergen Reactions   Asa [Aspirin] Other (See Comments)    REACTION: Stomach upset   Ibuprofen     STOMACH UPSET    Family History  Problem Relation Age of Onset   Heart failure Father    Hypertension Father    Stroke Father    Colon cancer Father        greater than age 23  71ancer Father    Dementia Father    Cancer Mother    Seizures Sister    Inflammatory bowel disease Neg Hx    Liver disease Neg Hx      Prior to Admission medications    Medication Sig Start Date End Date Taking? Authorizing Provider  albuterol (PROVENTIL) (2.5 MG/3ML) 0.083% nebulizer solution INHALE 1 VIAL VIA NEBULIZER EVERY 6 HOURS AS NEEDED FOR WHEEZING AND SHORTNESS OF BREATH 12/25/16   McElSoyla Dryer-C  albuterol (VENTOLIN HFA) 108 (90 Base) MCG/ACT inhaler Inhale 1 puff into the lungs every 6 (six) hours as needed for wheezing or shortness of breath.  01/06/19   [provider]  amLODipine (NORVASC) 10 MG tablet Take 10 mg by mouth daily.    [provider]  Ascorbic Acid (VITAMIN C PO) Take 1 tablet by mouth daily.    [provider]  atorvastatin (LIPITOR) 40 MG tablet Take 40 mg by mouth daily.  04/09/19   [provider]  butalbital-acetaminophen-caffeine (FIORICET) 50-325-40 MG tablet Take 1 tablet by mouth 2 (two) times daily as needed. 01/11/21   [provider]  Calcium-Magnesium-Zinc (CAL-MAG-ZINC PO) Take 1 tablet by mouth daily.     [provider]  ferrous sulfate 325 (65 FE) MG tablet Take 325 mg by mouth daily with  breakfast.    [provider]  furosemide (LASIX) 20 MG tablet TAKE ONE TABLET BY mouth daily prn 08/13/20   Arnoldo Lenis, MD  Multiple Vitamin (MULTIVITAMIN) tablet Take 1 tablet by mouth daily.    [provider]  nitroGLYCERIN (NITROSTAT) 0.4 MG SL tablet Place 0.4 mg under the tongue every 5 (five) minutes as needed for chest pain. Patient not taking: Reported on 06/20/2021    [provider]  omeprazole (PRILOSEC) 20 MG capsule TAKE 1 CAPSULE BY MOUTH DAILY BEFORE A MEAL. Patient taking differently: Take 20 mg by mouth as needed. 05/15/18   Mahala Menghini, PA-C  pregabalin (LYRICA) 75 MG capsule Take 75 mg by mouth 3 (three) times daily.    [provider]  propranolol (INDERAL) 80 MG tablet Take 1 tablet (80 mg total) by mouth 2 (two) times daily. 10/30/17   Raylene Everts, MD  tiZANidine (ZANAFLEX) 2 MG tablet Take 1-2 tablets  by mouth every 8 (eight) hours as needed. 04/14/19   [provider]  topiramate (TOPAMAX) 25 MG tablet Take 25 mg by mouth at bedtime.    [provider]  traMADol (ULTRAM) 50 MG tablet Take 50-100 mg by mouth every 6 (six) hours as needed.    [provider]  traZODone (DESYREL) 50 MG tablet Take 50-75 mg by mouth at bedtime as needed for sleep.    [provider]    Physical Exam: BP 132/64    Pulse (!) 106    Temp (!) 102.4 F (39.1 C) (Rectal)    Resp (!) 26    Ht '5\' 3"'  (1.6 m)    Wt 63.3 kg    SpO2 94%    BMI 24.73 kg/m   General: 70 y.o. year-old female ill appearing and in acute distress.  Alert and oriented x3. HEENT: Dry mucous membrane.  NCAT, EOMI Neck: Supple, trachea medial Cardiovascular: Tachycardia.  Regular rate and rhythm with no rubs or gallops.  No thyromegaly or JVD noted.  No lower extremity edema. 2/4 pulses in all 4 extremities. Respiratory: Diffuse rhonchi and scattered wheezes on auscultation  Abdomen: Soft, nontender nondistended with normal bowel sounds x4 quadrants. Muskuloskeletal: No cyanosis, clubbing or edema noted bilaterally Neuro: CN II-XII intact, strength 5/5 x 4, sensation, reflexes intact Skin: No ulcerative lesions noted or rashes Psychiatry: Mood is appropriate for condition and setting          Labs on Admission:  Basic Metabolic Panel: Recent Labs  Lab 06/24/2021 2116 06/19/2021 2117  NA 139  --   K 2.9*  --   CL 109  --   CO2 17*  --   GLUCOSE 143*  --   BUN 18  --   CREATININE 1.14*  --   CALCIUM 9.2  --   MG  --  2.0   Liver Function Tests: Recent Labs  Lab 06/15/2021 2116  AST 64*  ALT 29  ALKPHOS 62  BILITOT 1.0  PROT 8.7*  ALBUMIN 3.3*   No results for input(s): LIPASE, AMYLASE in the last 168 hours. No results for input(s): AMMONIA in the last 168 hours. CBC: Recent Labs  Lab 06/16/2021 2117  WBC 16.7*  HGB 11.3*  HCT 33.9*  MCV 86.5  PLT 239   Cardiac Enzymes: No results for  input(s): CKTOTAL, CKMB, CKMBINDEX, TROPONINI in the last 168 hours.  BNP (last 3 results) No results for input(s): BNP in the last 8760 hours.  ProBNP (last 3 results)  No results for input(s): PROBNP in the last 8760 hours.  CBG: Recent Labs  Lab 06/10/2021 2122  GLUCAP 144*    Radiological Exams on Admission: DG Chest Portable 1 View  Result Date: 06/21/2021 CLINICAL DATA:  Shortness of breath.  Sepsis workup. EXAM: PORTABLE CHEST 1 VIEW COMPARISON:  Chest CT with contrast and portable chest both 10/22/2018. FINDINGS: There is mild cardiomegaly, normal caliber central vessels. There is a low inspiration. Interstitial and patchy hazy opacities in the left mid to lower lung field are noted most likely due to pneumonia. The remaining hypoinflated lungs are clear. No pleural effusion is seen. There is calcification of the transverse aorta. Thoracic spondylosis. IMPRESSION: Interstitial and patchy hazy opacities of the left mid to lower lung field, most likely due to pneumonia. This could all be in the upper lobe or could be a combination of upper and lower lobe disease. Clinical correlation and radiographic follow-up recommended. Limited view of the lung bases due to low inspiration Electronically Signed   By: Telford Nab M.D.   On: 07/07/2021 21:50    EKG: I independently viewed the EKG done and my findings are as followed: Sinus tachycardia at a rate of 116 bpm with QTc of 659 ms  Assessment/Plan Present on Admission:  CAP (community acquired pneumonia)  Chronic obstructive pulmonary disease ([place])  Essential hypertension, benign  Gastroesophageal reflux disease without esophagitis  Principal Problem:   CAP (community acquired pneumonia) Active Problems:   Mixed hyperlipidemia   Essential hypertension, benign   Chronic obstructive pulmonary disease (HCC)   Gastroesophageal reflux disease without esophagitis   Sepsis ([place])   Acute respiratory failure with hypoxia (HCC)   GI  bleed   Nausea & vomiting   Diarrhea   Leukocytosis   Hypokalemia   Hypoalbuminemia due to protein-calorie malnutrition ([place])   Acute respiratory failure with hypoxia secondary to sepsis due to CAP POA Patient met sepsis criteria due to tachycardia, tachypnea, fever, and pneumonia as source of infection. Chest x-ray was suggestive of pneumonia Patient was started on ceftriaxone and azithromycin, we shall continue with ceftriaxone and doxycycline at this time with plan to de-escalate/discontinue based on blood culture, sputum culture, urine Legionella, strep pneumo and procalcitonin Continue Tylenol as needed Continue Mucinex, incentive spirometry, flutter valve  Continue supplemental oxygen to maintain O2 sat > 94% with plan to wean patient off the oxygen as tolerated (patient does not use oxygen at baseline)  Leukocytosis possibly secondary to above WBC 16.9 management as described above  Acute GI bleed H/H= 11.3/33.9, this was 10.7/33.0 on 06/22/2021 Hemoccult was positive Continue IV Protonix drip Gastroenterologist  will be consulted in the morning  Nausea, vomiting and diarrhea Continue Compazine as needed C. difficile will be checked GI stool panel will be checked  Hypokalemia K+ is 2.9 K+ will be replenished Please monitor for AM K+ for further replenishmemnt  Elevated D-dimer D-dimer 1.42, CT angiography of chest was negative for PE  Prolonged QTc (659 ms) Avoid QT prolonging drugs Magnesium level was 2.0 Potassium will be replenished   Dehydration Continue IV hydration  COPD Continue Atrovent as needed  GERD Continue Protonix  Migraine headache Continue Fioricet and Topamax  Essential hypertension Continue Norvasc  Mixed hyperlipidemia Continue Lipitor  DVT prophylaxis: Lovenox  Code Status: Full code  Family Communication: Daughter at bedside (all questions answered to satisfaction)  Disposition Plan:  Patient is from:  home Anticipated DC to:                   SNF or family members home Anticipated DC date:               2-3 days Anticipated DC barriers:          Patient requires inpatient management due to severity of symptoms  Consults called: Gastroenterology  Admission status: Inpatient    Bernadette Hoit MD [facility]  07/09/2021, 1:14 AM

## 2021-07-08 NOTE — ED Provider Notes (Signed)
[name] Provider Note   CSN: [id number] Arrival date & time: 06/30/2021  2013     History Chief Complaint  Patient presents with   Shortness of Breath   Weakness    C[name] is a 70 y.o. female.     Shortness of Breath Weakness Associated symptoms: shortness of breath    This patient is a very ill-appearing 70 year old female, she has a known history of COPD, she has a history of acid reflux hypertension high blood pressure high blood cholesterol, she also has a history of prior pneumonia.  She presents to the hospital with a complaint of increasing shortness of breath which is been going on for the last 24 to 48 hours associated with nausea vomiting and diarrhea which has been nonstop.  She has felt like she has had a fever, she has not been around anybody who has been sick, the symptoms are gradually worsening became severe and are now even worse today.  She presents with her daughter who gives additional history stating that the patient does look much worse today than she usually does.  She is not on oxygen but presented with oxygen levels in the mid 80% range requiring at this time up to 3 L to 4 L by nasal cannula to get to 92%.  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    Patient Active Problem List   Diagnosis Date Noted   Rectal bleeding 06/20/2021   Hypotension 06/20/2021    Iron deficiency anemia due to chronic blood loss 06/20/2021   Constipation 03/10/2021   Abnormal CT of the abdomen 05/28/2020   Abdominal pain 05/28/2020   Elevated LFTs 01/29/2020   Loss of weight 01/29/2020   Early satiety 01/29/2020   Loss of appetite 01/29/2020   Somnolence, daytime 06/13/2017   Tobacco abuse 05/02/2017   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   Atherosclerotic peripheral vascular disease with intermittent claudication ([place]) 01/25/2017   Gastroesophageal reflux disease without esophagitis 10/13/2015   Chronic obstructive pulmonary disease ([place]) 07/14/2015   Obstructive chronic bronchitis without exacerbation ([place]) 04/13/2015   Headache, tension type, chronic 04/13/2015   Anxiety state 02/27/2015   FH: colon cancer 01/15/2015   Hepatitis C virus infection resolved after antiviral drug therapy 01/15/2015   Hyperlipidemia 11/26/2008   UNSPECIFIED ANEMIA 11/26/2008   MIGRAINE HEADACHE 11/11/2008   Essential hypertension, benign 11/11/2008   Osteoarthritis of hands, bilateral 11/11/2008    Past Surgical History:  Procedure Laterality Date   BIOPSY  02/09/2015   Procedure: GASTRIC BIOPSIES ;  Surgeon: Danie Binder, MD;  Location: [facility];  Service: Endoscopy;;   BIOPSY  02/24/2020   Procedure: BIOPSY;  Surgeon: Eloise Harman, DO;  Location: [facility];  Service: Endoscopy;;   COLONOSCOPY WITH PROPOFOL N/A 02/09/2015   SLF: 1. the examined terminal ileum appeared to be normal 2. the colonic mucosa appeared normal 3. small internal hemorrhoids   ESOPHAGOGASTRODUODENOSCOPY (EGD) WITH PROPOFOL N/A 02/09/2015   SLF: 1. stricture at the gastroesophageal junction 2. mild non-erosive gastritis    ESOPHAGOGASTRODUODENOSCOPY (EGD) WITH PROPOFOL N/A 02/24/2020   Procedure: ESOPHAGOGASTRODUODENOSCOPY (EGD) WITH PROPOFOL;  Surgeon: Eloise Harman, DO;  Gastritis s/p biopsy (nonspecific reactive gastropathy),  normal examined duodenum s/p biopsy (benign).   GIVENS CAPSULE STUDY N/A 10/19/2016   Procedure: GIVENS CAPSULE STUDY;  Surgeon: Danie Binder, MD;  Location: [facility];  Service: Endoscopy;  Laterality: N/A;  7:30am, pt to arrive at 7:00am   left arm     ? nerve repaired, " it was pinched".   left elbow surgery Left    SAVORY DILATION N/A 02/09/2015   Procedure: SAVORY DILATION 12.46mm, 23mm, 12mm, 30mm;  Surgeon: Danie Binder, MD;  Location: [facility];  Service: Endoscopy;  Laterality: N/A;   TONSILLECTOMY     TUBAL LIGATION       OB History     Gravida  2   Para  1   Term      Preterm      AB  1   Living         SAB      IAB      Ectopic      Multiple      Live Births              Family History  Problem Relation Age of Onset   Heart failure Father    Hypertension Father    Stroke Father    Colon cancer Father        greater than age 20   Cancer Father    Dementia Father    Cancer Mother    Seizures Sister    Inflammatory bowel disease Neg Hx    Liver disease Neg Hx     Social History   Tobacco Use   Smoking status: Every Day    Packs/day: 0.25    Years: 42.00    Pack years: 10.50    Types: Cigarettes    Start date: 09/29/1972   Smokeless tobacco: Never   Tobacco comments:    about 2 cigs per day  Vaping Use   Vaping Use: Never used  Substance Use Topics   Alcohol use: No    Alcohol/week: 0.0 standard drinks   Drug use: Not Currently    Types: "Crack" cocaine, Marijuana    Comment: hx of smoking crack cocaine last 5-6 years ago; 02/16/21 occ marijuana; 06/20/21 denied    Home Medications Prior to Admission medications   Medication Sig Start Date End Date Taking? Authorizing Provider  albuterol (PROVENTIL) (2.5 MG/3ML) 0.083% nebulizer solution INHALE 1 VIAL VIA NEBULIZER EVERY 6 HOURS AS NEEDED FOR WHEEZING AND SHORTNESS OF BREATH 12/25/16   Soyla Dryer, PA-C  albuterol (VENTOLIN HFA) 108 (90 Base) MCG/ACT inhaler Inhale 1 puff  into the lungs every 6 (six) hours as needed for wheezing or shortness of breath.  01/06/19   [provider]  amLODipine (NORVASC) 10 MG tablet Take 10 mg by mouth daily.    [provider]  Ascorbic Acid (VITAMIN C PO) Take 1 tablet by mouth daily.    [provider]  atorvastatin (LIPITOR) 40 MG tablet Take 40 mg by mouth daily.  04/09/19  [provider]  butalbital-acetaminophen-caffeine (FIORICET) 50-325-40 MG tablet Take 1 tablet by mouth 2 (two) times daily as needed. 01/11/21   [provider]  Calcium-Magnesium-Zinc (CAL-MAG-ZINC PO) Take 1 tablet by mouth daily.     [provider]  ferrous sulfate 325 (65 FE) MG tablet Take 325 mg by mouth daily with breakfast.    [provider]  furosemide (LASIX) 20 MG tablet TAKE ONE TABLET BY mouth daily prn 08/13/20   Arnoldo Lenis, MD  Multiple Vitamin (MULTIVITAMIN) tablet Take 1 tablet by mouth daily.    [provider]  nitroGLYCERIN (NITROSTAT) 0.4 MG SL tablet Place 0.4 mg under the tongue every 5 (five) minutes as needed for chest pain. Patient not taking: Reported on 06/20/2021    [provider]  omeprazole (PRILOSEC) 20 MG capsule TAKE 1 CAPSULE BY MOUTH DAILY BEFORE A MEAL. Patient taking differently: Take 20 mg by mouth as needed. 05/15/18   Mahala Menghini, PA-C  pregabalin (LYRICA) 75 MG capsule Take 75 mg by mouth 3 (three) times daily.    [provider]  propranolol (INDERAL) 80 MG tablet Take 1 tablet (80 mg total) by mouth 2 (two) times daily. 10/30/17   Raylene Everts, MD  tiZANidine (ZANAFLEX) 2 MG tablet Take 1-2 tablets by mouth every 8 (eight) hours as needed. 04/14/19   [provider]  topiramate (TOPAMAX) 25 MG tablet Take 25 mg by mouth at bedtime.    [provider]  traMADol (ULTRAM) 50 MG tablet Take 50-100 mg by mouth every 6 (six) hours as needed.    [provider]  traZODone (DESYREL) 50 MG tablet  Take 50-75 mg by mouth at bedtime as needed for sleep.    [provider]    Allergies    Asa [aspirin] and Ibuprofen  Review of Systems   Review of Systems  Respiratory:  Positive for shortness of breath.   Neurological:  Positive for weakness.  All other systems reviewed and are negative.  Physical Exam Updated Vital Signs BP 130/66    Pulse (!) 121    Temp 99 F (37.2 C) (Oral)    Resp (!) 38    Ht 1.6 m (5\' 3" )    Wt 63.3 kg    SpO2 91%    BMI 24.73 kg/m   Physical Exam Vitals and nursing note reviewed.  Constitutional:      General: She is in acute distress.     Appearance: She is well-developed. She is ill-appearing.  HENT:     Head: Normocephalic and atraumatic.     Mouth/Throat:     Mouth: Mucous membranes are dry.     Pharynx: No oropharyngeal exudate.  Eyes:     General: No scleral icterus.       Right eye: No discharge.        Left eye: No discharge.     Conjunctiva/sclera: Conjunctivae normal.     Pupils: Pupils are equal, round, and reactive to light.  Neck:     Thyroid: No thyromegaly.     Vascular: No JVD.  Cardiovascular:     Rate and Rhythm: Regular rhythm. Tachycardia present.     Heart sounds: Normal heart sounds. No murmur heard.   No friction rub. No gallop.  Pulmonary:     Effort: Respiratory distress present.     Breath sounds: Wheezing, rhonchi and rales present.  Abdominal:     General: Bowel sounds are normal. There is no distension.  Palpations: Abdomen is soft. There is no mass.     Tenderness: There is no abdominal tenderness.  Musculoskeletal:        General: No tenderness. Normal range of motion.     Cervical back: Normal range of motion and neck supple.     Right lower leg: No edema.     Left lower leg: No edema.  Lymphadenopathy:     Cervical: No cervical adenopathy.  Skin:    General: Skin is warm and dry.     Findings: No erythema or rash.  Neurological:     Mental Status: She is alert.     Coordination:  Coordination normal.  Psychiatric:        Behavior: Behavior normal.    ED Results / Procedures / Treatments   Labs (all labs ordered are listed, but only abnormal results are displayed) Labs Reviewed  CBC - Abnormal; Notable for the following components:      Result Value   WBC 16.7 (*)    Hemoglobin 11.3 (*)    HCT 33.9 (*)    RDW 16.5 (*)    All other components within normal limits  COMPREHENSIVE METABOLIC PANEL - Abnormal; Notable for the following components:   Potassium 2.9 (*)    CO2 17 (*)    Glucose, Bld 143 (*)    Creatinine, Ser 1.14 (*)    Total Protein 8.7 (*)    Albumin 3.3 (*)    AST 64 (*)    GFR, Estimated 52 (*)    All other components within normal limits  PROTIME-INR - Abnormal; Notable for the following components:   Prothrombin Time 15.5 (*)    All other components within normal limits  CBG MONITORING, ED - Abnormal; Notable for the following components:   Glucose-Capillary 144 (*)    All other components within normal limits  RESP PANEL BY RT-PCR (FLU A&B, COVID) ARPGX2  CULTURE, BLOOD (ROUTINE X 2)  CULTURE, BLOOD (ROUTINE X 2)  URINE CULTURE  APTT  URINALYSIS, ROUTINE W REFLEX MICROSCOPIC  LACTIC ACID, PLASMA  LACTIC ACID, PLASMA    EKG EKG Interpretation  Date/Time:  Friday July 08 2021 21:10:29 EST Ventricular Rate:  116 PR Interval:  134 QRS Duration: 92 QT Interval:  474 QTC Calculation: 659 R Axis:   52 Text Interpretation: Sinus tachycardia Biatrial enlargement Abnormal R-wave progression, early transition Borderline ST depression, diffuse leads Prolonged QT interval possible - possible just T wave flattening Confirmed by Noemi Chapel [id number]) on 06/28/2021 9:20:55 PM  Radiology DG Chest Portable 1 View  Result Date: 07/06/2021 CLINICAL DATA:  Shortness of breath.  Sepsis workup. EXAM: PORTABLE CHEST 1 VIEW COMPARISON:  Chest CT with contrast and portable chest both 10/22/2018. FINDINGS: There is mild cardiomegaly, normal  caliber central vessels. There is a low inspiration. Interstitial and patchy hazy opacities in the left mid to lower lung field are noted most likely due to pneumonia. The remaining hypoinflated lungs are clear. No pleural effusion is seen. There is calcification of the transverse aorta. Thoracic spondylosis. IMPRESSION: Interstitial and patchy hazy opacities of the left mid to lower lung field, most likely due to pneumonia. This could all be in the upper lobe or could be a combination of upper and lower lobe disease. Clinical correlation and radiographic follow-up recommended. Limited view of the lung bases due to low inspiration Electronically Signed   By: Telford Nab M.D.   On: 06/17/2021 21:50    Procedures .Critical Care Performed by: Sabra Heck,  Aaron Edelman, MD Authorized by: Noemi Chapel, MD   Critical care provider statement:    Critical care time (minutes):  40   Critical care was necessary to treat or prevent imminent or life-threatening deterioration of the following conditions:  Sepsis and respiratory failure   Critical care was time spent personally by me on the following activities:  Development of treatment plan with patient or surrogate, discussions with consultants, evaluation of patient's response to treatment, examination of patient, ordering and review of laboratory studies, ordering and review of radiographic studies, ordering and performing treatments and interventions, pulse oximetry, re-evaluation of patient's condition and review of old charts   I assumed direction of critical care for this patient from another provider in my specialty: no     Care discussed with: admitting provider   Comments:         Medications Ordered in ED Medications  lactated ringers infusion (has no administration in time range)  cefTRIAXone (ROCEPHIN) 2 g in sodium chloride 0.9 % 100 mL IVPB (2 g Intravenous New Bag/Given 07/06/2021 2200)  azithromycin (ZITHROMAX) 500 mg in sodium chloride 0.9 % 250 mL  IVPB (500 mg Intravenous New Bag/Given 06/14/2021 2159)  sodium chloride 0.9 % bolus 1,000 mL (1,000 mLs Intravenous New Bag/Given 06/24/2021 2200)  metoCLOPramide (REGLAN) injection 10 mg (10 mg Intravenous Given 06/30/2021 2153)  ipratropium-albuterol (DUONEB) 0.5-2.5 (3) MG/3ML nebulizer solution 3 mL (3 mLs Nebulization Given 07/01/2021 2135)    ED Course  I have reviewed the triage vital signs and the nursing notes.  Pertinent labs & imaging results that were available during my care of the patient were reviewed by me and considered in my medical decision making (see chart for details).    MDM Rules/Calculators/A&P                          This patient presents to the ED for concern of shortness of breath and respiratory distress, this involves an extensive number of treatment options, and is a complaint that carries with it a high risk of complications and morbidity.  The differential diagnosis includes sepsis, flu, COVID, could also be other things such as pneumonia, pulmonary embolism or pneumothorax.  Given the gastrointestinal symptoms I would also consider that this could be viral.   Co morbidities that complicate the patient evaluation  Hypertension, COPD   Additional history obtained:  Additional history obtained from family member and the medical record External records from outside source obtained and reviewed including the patient has been seen by gastroenterology, she has also been seen by cardiology back in August, she was in the emergency department in May, she was admitted to the hospital back in August 2021 to GI for an EGD.  No recent admissions to the hospital or discharges   Lab Tests:  I Ordered, and personally interpreted labs.  The pertinent results include:  COVID and influenza testing which is negative, glucose of 144, white blood cell count of 16,700 with a rather normal hemoglobin.  The patient is hypokalemic to 2.9, CO2 is 17, creatinine is 1.14, slight bump in  the AST to 64.  Normal anion gap, INR of 1.2   Imaging Studies ordered:  I ordered imaging studies including including a portable chest x-ray I independently visualized and interpreted imaging which showed diffuse left-sided infiltrate consistent with sepsis and pulmonary process like pneumonia I agree with the radiologist interpretation   Cardiac Monitoring:  The patient was maintained on a cardiac  monitor.  I personally viewed and interpreted the cardiac monitored which showed an underlying rhythm of: Sinus tachycardia   Medicines ordered and prescription drug management:  I ordered medication including Rocephin and Zithromax as well as IV fluid for presumed sepsis Reevaluation of the patient after these medicines showed that the patient improved I have reviewed the patients home medicines and have made adjustments as needed   Test Considered:  Consider CT scan but at this time chest x-ray seems to be the determining factor in fits with a clinical picture   Critical Interventions:  Antibiotics, IV fluids, oxygen for hypoxia   Consultations Obtained:  I requested consultation with the hospitalist,  and discussed lab and imaging findings as well as pertinent plan - they recommend: Admission to the hospital   Problem List / ED Course:  Sepsis, this is likely from pneumonia and a pulmonary source, antibiotics and fluids given Hypokalemia, she will be replaced with some magnesium and potassium Hypoxia, acute hypoxic respiratory failure will be put retreated with oxygen supplementation and BiPAP if needed, she does not need intubation at this time   Reevaluation:  After the interventions noted above, I reevaluated the patient and found that they have :improved   Social Determinants of Health:  None   Dispostion:  After consideration of the diagnostic results and the patients response to treatment, I feel that the patent would benefit from admission to the hospital.        Final Clinical Impression(s) / ED Diagnoses Final diagnoses:  Sepsis without acute organ dysfunction, due to unspecified organism [facility])  Hypokalemia    Rx / DC Orders ED Discharge Orders     None        Noemi Chapel, MD 06/17/2021 2223

## 2021-07-08 NOTE — ED Triage Notes (Signed)
Rcems from home with cc of flu s/s  for 3 days with sob, n/v/d.89% on room air. Placed on 3l 92%. Also c/o headache.  Hasnt had her medication in 3 days. Lives alone.

## 2021-07-09 ENCOUNTER — Inpatient Hospital Stay (HOSPITAL_COMMUNITY): Payer: Medicare Other

## 2021-07-09 DIAGNOSIS — D72829 Elevated white blood cell count, unspecified: Secondary | ICD-10-CM | POA: Diagnosis present

## 2021-07-09 DIAGNOSIS — J9601 Acute respiratory failure with hypoxia: Secondary | ICD-10-CM | POA: Diagnosis present

## 2021-07-09 DIAGNOSIS — A419 Sepsis, unspecified organism: Principal | ICD-10-CM

## 2021-07-09 DIAGNOSIS — R197 Diarrhea, unspecified: Secondary | ICD-10-CM | POA: Diagnosis present

## 2021-07-09 DIAGNOSIS — E876 Hypokalemia: Secondary | ICD-10-CM | POA: Diagnosis present

## 2021-07-09 DIAGNOSIS — E46 Unspecified protein-calorie malnutrition: Secondary | ICD-10-CM | POA: Diagnosis present

## 2021-07-09 DIAGNOSIS — R9431 Abnormal electrocardiogram [ECG] [EKG]: Secondary | ICD-10-CM | POA: Diagnosis present

## 2021-07-09 DIAGNOSIS — K922 Gastrointestinal hemorrhage, unspecified: Secondary | ICD-10-CM | POA: Diagnosis present

## 2021-07-09 DIAGNOSIS — E8809 Other disorders of plasma-protein metabolism, not elsewhere classified: Secondary | ICD-10-CM | POA: Diagnosis present

## 2021-07-09 DIAGNOSIS — J181 Lobar pneumonia, unspecified organism: Secondary | ICD-10-CM

## 2021-07-09 DIAGNOSIS — R112 Nausea with vomiting, unspecified: Secondary | ICD-10-CM | POA: Diagnosis present

## 2021-07-09 LAB — PROCALCITONIN: Procalcitonin: 0.44 ng/mL

## 2021-07-09 LAB — BLOOD GAS, ARTERIAL
Acid-base deficit: 6.9 mmol/L — ABNORMAL HIGH (ref 0.0–2.0)
Acid-base deficit: 5.5 mmol/L — ABNORMAL HIGH (ref 0.0–2.0)
Bicarbonate: 19.4 mmol/L — ABNORMAL LOW (ref 20.0–28.0)
Bicarbonate: 19.8 mmol/L — ABNORMAL LOW (ref 20.0–28.0)
Drawn by: 22223
Drawn by: 38235
FIO2: 48
FIO2: 50
O2 Saturation: 89.9 %
O2 Saturation: 83 %
Patient temperature: 37
Patient temperature: 37
pCO2 arterial: 22.8 mmHg — ABNORMAL LOW (ref 32.0–48.0)
pCO2 arterial: 35 mmHg (ref 32.0–48.0)
pH, Arterial: 7.465 — ABNORMAL HIGH (ref 7.350–7.450)
pH, Arterial: 7.354 (ref 7.350–7.450)
pO2, Arterial: 55.7 mmHg — ABNORMAL LOW (ref 83.0–108.0)
pO2, Arterial: 52.8 mmHg — ABNORMAL LOW (ref 83.0–108.0)

## 2021-07-09 LAB — COMPREHENSIVE METABOLIC PANEL
ALT: 27 U/L (ref 0–44)
AST: 63 U/L — ABNORMAL HIGH (ref 15–41)
Albumin: 2.8 g/dL — ABNORMAL LOW (ref 3.5–5.0)
Alkaline Phosphatase: 57 U/L (ref 38–126)
Anion gap: 12 (ref 5–15)
BUN: 12 mg/dL (ref 8–23)
CO2: 17 mmol/L — ABNORMAL LOW (ref 22–32)
Calcium: 8.4 mg/dL — ABNORMAL LOW (ref 8.9–10.3)
Chloride: 110 mmol/L (ref 98–111)
Creatinine, Ser: 0.91 mg/dL (ref 0.44–1.00)
GFR, Estimated: >60 mL/min (ref >60)
Glucose, Bld: 137 mg/dL — ABNORMAL HIGH (ref 70–99)
Potassium: 2.9 mmol/L — ABNORMAL LOW (ref 3.5–5.1)
Sodium: 139 mmol/L (ref 135–145)
Total Bilirubin: 0.5 mg/dL (ref 0.3–1.2)
Total Protein: 7.4 g/dL (ref 6.5–8.1)

## 2021-07-09 LAB — URINALYSIS, ROUTINE W REFLEX MICROSCOPIC
Bacteria, UA: NONE SEEN
Bilirubin Urine: NEGATIVE
Glucose, UA: NEGATIVE mg/dL
Ketones, ur: 5 mg/dL — AB
Leukocytes,Ua: NEGATIVE
Nitrite: NEGATIVE
Protein, ur: 100 mg/dL — AB
Specific Gravity, Urine: 1.015 (ref 1.005–1.030)
pH: 5 (ref 5.0–8.0)

## 2021-07-09 LAB — MAGNESIUM
Magnesium: 2 mg/dL (ref 1.7–2.4)
Magnesium: 1.7 mg/dL (ref 1.7–2.4)
Magnesium: 1.5 mg/dL — ABNORMAL LOW (ref 1.7–2.4)

## 2021-07-09 LAB — C DIFFICILE QUICK SCREEN W PCR REFLEX
C Diff antigen: NEGATIVE
C Diff interpretation: NOT DETECTED
C Diff toxin: NEGATIVE

## 2021-07-09 LAB — POTASSIUM: Potassium: 2.9 mmol/L — ABNORMAL LOW (ref 3.5–5.1)

## 2021-07-09 LAB — CBC
HCT: 30.5 % — ABNORMAL LOW (ref 36.0–46.0)
Hemoglobin: 9.9 g/dL — ABNORMAL LOW (ref 12.0–15.0)
MCH: 27.8 pg (ref 26.0–34.0)
MCHC: 32.5 g/dL (ref 30.0–36.0)
MCV: 85.7 fL (ref 80.0–100.0)
Platelets: 198 10*3/uL (ref 150–400)
RBC: 3.56 MIL/uL — ABNORMAL LOW (ref 3.87–5.11)
RDW: 16.2 % — ABNORMAL HIGH (ref 11.5–15.5)
WBC: 19.2 10*3/uL — ABNORMAL HIGH (ref 4.0–10.5)
nRBC: 0 % (ref 0.0–0.2)

## 2021-07-09 LAB — POC OCCULT BLOOD, ED: Fecal Occult Bld: POSITIVE — AB

## 2021-07-09 LAB — LACTIC ACID, PLASMA: Lactic Acid, Venous: 1.7 mmol/L (ref 0.5–1.9)

## 2021-07-09 LAB — STREP PNEUMONIAE URINARY ANTIGEN: Strep Pneumo Urinary Antigen: NEGATIVE

## 2021-07-09 LAB — D-DIMER, QUANTITATIVE: D-Dimer, Quant: 1.42 ug/mL-FEU — ABNORMAL HIGH (ref 0.00–0.50)

## 2021-07-09 LAB — HIV ANTIBODY (ROUTINE TESTING W REFLEX): HIV Screen 4th Generation wRfx: NONREACTIVE

## 2021-07-09 LAB — PHOSPHORUS: Phosphorus: 2.5 mg/dL (ref 2.5–4.6)

## 2021-07-09 LAB — MRSA NEXT GEN BY PCR, NASAL: MRSA by PCR Next Gen: NOT DETECTED

## 2021-07-09 MED ORDER — CHLORHEXIDINE GLUCONATE CLOTH 2 % EX PADS
6.0000 | MEDICATED_PAD | Freq: Every day | CUTANEOUS | Status: DC
  Administered 2021-07-09 – 2021-07-19 (×12): 6 via TOPICAL

## 2021-07-09 MED ORDER — MORPHINE SULFATE (PF) 2 MG/ML IV SOLN
2.0000 mg | INTRAVENOUS | Status: DC | PRN
  Administered 2021-07-09 – 2021-07-10 (×3): 2 mg via INTRAVENOUS
  Filled 2021-07-09 (×3): qty 1

## 2021-07-09 MED ORDER — ACETAMINOPHEN 10 MG/ML IV SOLN
1000.0000 mg | Freq: Once | INTRAVENOUS | Status: AC
  Administered 2021-07-09: 1000 mg via INTRAVENOUS
  Filled 2021-07-09: qty 100

## 2021-07-09 MED ORDER — ACETAMINOPHEN 325 MG PO TABS
650.0000 mg | ORAL_TABLET | Freq: Four times a day (QID) | ORAL | Status: DC | PRN
  Administered 2021-07-09 – 2021-07-10 (×2): 650 mg via ORAL
  Filled 2021-07-09 (×2): qty 2

## 2021-07-09 MED ORDER — SODIUM CHLORIDE 0.9 % IV SOLN: INTRAVENOUS | Status: DC | PRN

## 2021-07-09 MED ORDER — ROCURONIUM BROMIDE 10 MG/ML (PF) SYRINGE
PREFILLED_SYRINGE | INTRAVENOUS | Status: AC
  Filled 2021-07-09: qty 10

## 2021-07-09 MED ORDER — PANTOPRAZOLE 80MG IVPB - SIMPLE MED
80.0000 mg | Freq: Once | INTRAVENOUS | Status: AC
  Administered 2021-07-09: 80 mg via INTRAVENOUS
  Filled 2021-07-09: qty 100

## 2021-07-09 MED ORDER — PANTOPRAZOLE INFUSION (NEW) - SIMPLE MED
8.0000 mg/h | INTRAVENOUS | Status: AC
  Administered 2021-07-09 – 2021-07-11 (×7): 8 mg/h via INTRAVENOUS
  Filled 2021-07-09: qty 80
  Filled 2021-07-09: qty 0
  Filled 2021-07-09 (×4): qty 100
  Filled 2021-07-09: qty 0
  Filled 2021-07-09: qty 100

## 2021-07-09 MED ORDER — POTASSIUM CHLORIDE 10 MEQ/100ML IV SOLN
10.0000 meq | INTRAVENOUS | Status: AC
  Administered 2021-07-09 (×4): 10 meq via INTRAVENOUS
  Filled 2021-07-09 (×3): qty 100

## 2021-07-09 MED ORDER — ENOXAPARIN SODIUM 40 MG/0.4ML IJ SOSY
40.0000 mg | PREFILLED_SYRINGE | INTRAMUSCULAR | Status: DC
  Administered 2021-07-09: 40 mg via SUBCUTANEOUS
  Filled 2021-07-09: qty 0.4

## 2021-07-09 MED ORDER — MIDAZOLAM HCL 2 MG/2ML IJ SOLN
INTRAMUSCULAR | Status: AC
  Filled 2021-07-09: qty 2

## 2021-07-09 MED ORDER — MORPHINE SULFATE (PF) 2 MG/ML IV SOLN
1.0000 mg | Freq: Once | INTRAVENOUS | Status: AC
  Administered 2021-07-09: 1 mg via INTRAVENOUS
  Filled 2021-07-09: qty 1

## 2021-07-09 MED ORDER — DM-GUAIFENESIN ER 30-600 MG PO TB12
1.0000 | ORAL_TABLET | Freq: Two times a day (BID) | ORAL | Status: DC
  Administered 2021-07-09: 1 via ORAL
  Filled 2021-07-09: qty 1

## 2021-07-09 MED ORDER — POTASSIUM CHLORIDE CRYS ER 20 MEQ PO TBCR
20.0000 meq | EXTENDED_RELEASE_TABLET | Freq: Once | ORAL | Status: AC
  Administered 2021-07-09: 20 meq via ORAL
  Filled 2021-07-09: qty 1

## 2021-07-09 MED ORDER — BUTALBITAL-APAP-CAFFEINE 50-325-40 MG PO TABS: 1.0000 | ORAL_TABLET | Freq: Two times a day (BID) | ORAL | Status: DC | PRN

## 2021-07-09 MED ORDER — POTASSIUM CHLORIDE 10 MEQ/100ML IV SOLN
10.0000 meq | INTRAVENOUS | Status: AC
  Administered 2021-07-09 (×2): 10 meq via INTRAVENOUS
  Filled 2021-07-09 (×2): qty 100

## 2021-07-09 MED ORDER — PANTOPRAZOLE SODIUM 40 MG IV SOLR
40.0000 mg | Freq: Two times a day (BID) | INTRAVENOUS | Status: DC
  Administered 2021-07-12 – 2021-07-19 (×15): 40 mg via INTRAVENOUS
  Filled 2021-07-09 (×15): qty 40

## 2021-07-09 MED ORDER — PIPERACILLIN-TAZOBACTAM 3.375 G IVPB
3.3750 g | Freq: Three times a day (TID) | INTRAVENOUS | Status: AC
  Administered 2021-07-09 – 2021-07-15 (×17): 3.375 g via INTRAVENOUS
  Filled 2021-07-09 (×18): qty 50

## 2021-07-09 MED ORDER — MAGNESIUM SULFATE 2 GM/50ML IV SOLN
2.0000 g | Freq: Once | INTRAVENOUS | Status: AC
  Administered 2021-07-09: 2 g via INTRAVENOUS
  Filled 2021-07-09: qty 50

## 2021-07-09 MED ORDER — ETOMIDATE 2 MG/ML IV SOLN
INTRAVENOUS | Status: AC
  Filled 2021-07-09: qty 20

## 2021-07-09 MED ORDER — IPRATROPIUM BROMIDE 0.02 % IN SOLN
0.5000 mg | Freq: Four times a day (QID) | RESPIRATORY_TRACT | Status: DC | PRN
  Administered 2021-07-09 – 2021-07-11 (×5): 0.5 mg via RESPIRATORY_TRACT
  Filled 2021-07-09 (×4): qty 2.5

## 2021-07-09 MED ORDER — IOHEXOL 350 MG/ML SOLN
100.0000 mL | Freq: Once | INTRAVENOUS | Status: AC | PRN
  Administered 2021-07-09: 100 mL via INTRAVENOUS

## 2021-07-09 MED ORDER — FENTANYL CITRATE (PF) 100 MCG/2ML IJ SOLN
INTRAMUSCULAR | Status: AC
  Filled 2021-07-09: qty 2

## 2021-07-09 MED ORDER — PROCHLORPERAZINE EDISYLATE 10 MG/2ML IJ SOLN
10.0000 mg | Freq: Four times a day (QID) | INTRAMUSCULAR | Status: DC | PRN
  Filled 2021-07-09: qty 2

## 2021-07-09 MED ORDER — DEXMEDETOMIDINE HCL IN NACL 400 MCG/100ML IV SOLN
0.4000 ug/kg/h | INTRAVENOUS | Status: DC
  Administered 2021-07-09: 22:00:00 0.4 ug/kg/h via INTRAVENOUS
  Administered 2021-07-10: 1 ug/kg/h via INTRAVENOUS
  Administered 2021-07-10: 0.5 ug/kg/h via INTRAVENOUS
  Administered 2021-07-10 – 2021-07-11 (×3): 1 ug/kg/h via INTRAVENOUS
  Administered 2021-07-12: 0.9 ug/kg/h via INTRAVENOUS
  Administered 2021-07-13: 0.4 ug/kg/h via INTRAVENOUS
  Filled 2021-07-09 (×10): qty 100

## 2021-07-09 MED ORDER — HYDROCODONE BIT-HOMATROP MBR 5-1.5 MG/5ML PO SOLN
5.0000 mL | ORAL | Status: DC | PRN
  Administered 2021-07-09 – 2021-07-12 (×9): 5 mL via ORAL
  Filled 2021-07-09 (×9): qty 5

## 2021-07-09 MED ORDER — AMLODIPINE BESYLATE 5 MG PO TABS: 10.0000 mg | ORAL_TABLET | Freq: Every day | ORAL | Status: DC

## 2021-07-09 MED ORDER — KETAMINE HCL 50 MG/5ML IJ SOSY
PREFILLED_SYRINGE | INTRAMUSCULAR | Status: AC
  Filled 2021-07-09: qty 5

## 2021-07-09 MED ORDER — ATORVASTATIN CALCIUM 40 MG PO TABS
40.0000 mg | ORAL_TABLET | Freq: Every day | ORAL | Status: DC
  Administered 2021-07-09 – 2021-07-11 (×2): 40 mg via ORAL
  Filled 2021-07-09 (×4): qty 1

## 2021-07-09 MED ORDER — SODIUM CHLORIDE 0.9 % IV SOLN
100.0000 mg | Freq: Two times a day (BID) | INTRAVENOUS | Status: DC
  Administered 2021-07-09 – 2021-07-13 (×10): 100 mg via INTRAVENOUS
  Filled 2021-07-09 (×13): qty 100

## 2021-07-09 MED ORDER — SUCCINYLCHOLINE CHLORIDE 200 MG/10ML IV SOSY
PREFILLED_SYRINGE | INTRAVENOUS | Status: AC
  Filled 2021-07-09: qty 10

## 2021-07-09 MED ORDER — TOPIRAMATE 25 MG PO TABS: 25.0000 mg | ORAL_TABLET | Freq: Every day | ORAL | Status: DC

## 2021-07-09 NOTE — Progress Notes (Signed)
Attempted a trial of venturi-mask but oxygen saturations never got about 85% placed back on Bipap.

## 2021-07-09 NOTE — Progress Notes (Signed)
PROGRESS NOTE  Cynthia [name] ZDG:[id number] DOB: 04-03-1951 DOA: 06/18/2021 PCP: Celene Squibb, MD  Brief History:  70 year old female with a history of tobacco abuse, COPD, hypertension, hyperlipidemia, depression presenting with 2-day history of shortness of breath with associated nausea, vomiting, and diarrhea.  The patient also has subjective fevers and chills at home.  History is limited due to the patient's extremis.  Upon EMS arrival, she was noted to have oxygen saturation in the 80% range on room air.  Initial work-up showed a fever of 102.4 F, but she was hemodynamically stable.  She was placed on high flow nasal cannula up to 7 L.  WBC 16.7, hemoglobin 1.3, platelets 239,000.  Sodium 139, potassium 2.9, bicarbonate 17, BUN 12, creatinine 0.91.  The patient was initially started on ceftriaxone and azithromycin.  Apparently, the patient has noted some melanotic stool.  FOBT was positive.  Assessment/Plan: Sepsis -Present on admission -Secondary to pneumonia -Presented with fever, leukocytosis, and respiratory failure -Lactic acid peaked 1.7 -PCT 0.44 -follow blood culture -UA--no pyuria  Lobar pneumonia -CTA chest negative for PE, pulmonary artery with mild enlargement; bilateral GGO -Suspect component of aspiration pneumonitis given her recurrent vomiting -Start Zosyn -MRSA screen -continue doxy  Acute respiratory failure with hypoxia -pt requiring intermittent BiPAP -wean as tolerated for saturation >90% -due to pneumonia in setting of underlying COPD -COVID neg  Essential hypertension -Holding antihypertensive agents at this time and monitor clinically  Hypokalemia -Repleted -check mag  Diarrhea -cdiff neg -GI pathogen panel pending  Hyperlipidemia -continue statin  FOBT positive -not stable presently for endoscopic eval -continue protonix -SCDs  Chronic Pain syndrome -she is on buprenorphine patch 7.5 mcg/hr and lyrica       Family  Communication:  no  Family at bedside  Consultants:  none  Code Status:  FULL   DVT Prophylaxis:  SCDs   Procedures: As Listed in Progress Note Above  Antibiotics: Zosyn 12/31>> Doxy 12/31>>      Subjective: Pt complains of sob and cough. Denies vomiting but has nausea.  Has headache.  No abd pain or cp.  Objective: Vitals:   07/09/21 0658 07/09/21 0700 07/09/21 0800 07/09/21 0801  BP:  (!) 146/73 140/61 140/61  Pulse: (!) 101 100 (!) 108 (!) 107  Resp: (!) 35 (!) 33 20 (!) 26  Temp:      TempSrc:      SpO2: 98% 98% 91% 91%  Weight:      Height:        Intake/Output Summary (Last 24 hours) at 07/09/2021 0913 Last data filed at 07/09/2021 0810 Gross per 24 hour  Intake 3035.82 ml  Output 1 ml  Net 3034.82 ml   Weight change:  Exam:  General:  Pt is alert, follows commands appropriately, not in acute distress HEENT: No icterus, No thrush, No neck mass, [place]/AT Cardiovascular: RRR, S1/S2, no rubs, no gallops Respiratory: diminished breath sounds bilateral  bilateral rales Abdomen: Soft/+BS, non tender, non distended, no guarding Extremities: No edema, No lymphangitis, No petechiae, No rashes, no synovitis   Data Reviewed: I have personally reviewed following labs and imaging studies Basic Metabolic Panel: Recent Labs  Lab 06/29/2021 2116 06/18/2021 2117 07/09/21 0534  NA 139  --  139  K 2.9*  --  2.9*  CL 109  --  110  CO2 17*  --  17*  GLUCOSE 143*  --  137*  BUN 18  --  12  CREATININE 1.14*  --  0.91  CALCIUM 9.2  --  8.4*  MG  --  2.0 1.7  PHOS  --   --  2.5   Liver Function Tests: Recent Labs  Lab 07/02/2021 2116 07/09/21 0534  AST 64* 63*  ALT 29 27  ALKPHOS 62 57  BILITOT 1.0 0.5  PROT 8.7* 7.4  ALBUMIN 3.3* 2.8*   No results for input(s): LIPASE, AMYLASE in the last 168 hours. No results for input(s): AMMONIA in the last 168 hours. Coagulation Profile: Recent Labs  Lab 06/24/2021 2116  INR 1.2   CBC: Recent Labs  Lab  06/27/2021 2117 07/09/21 0534  WBC 16.7* 19.2*  HGB 11.3* 9.9*  HCT 33.9* 30.5*  MCV 86.5 85.7  PLT 239 198   Cardiac Enzymes: No results for input(s): CKTOTAL, CKMB, CKMBINDEX, TROPONINI in the last 168 hours. BNP: Invalid input(s): POCBNP CBG: Recent Labs  Lab 06/21/2021 2122  GLUCAP 144*   HbA1C: No results for input(s): HGBA1C in the last 72 hours. Urine analysis:    Component Value Date/Time   COLORURINE YELLOW 06/13/2021 2110   APPEARANCEUR CLEAR 06/29/2021 2110   LABSPEC 1.015 07/03/2021 2110   PHURINE 5.0 06/20/2021 2110   GLUCOSEU NEGATIVE 07/09/2021 2110   HGBUR MODERATE (A) 07/06/2021 2110   HGBUR moderate 11/11/2008 [place] 06/30/2021 2110   KETONESUR 5 (A) 07/07/2021 2110   PROTEINUR 100 (A) 06/15/2021 2110   UROBILINOGEN 0.2 11/11/2008 0921   NITRITE NEGATIVE 06/15/2021 2110   LEUKOCYTESUR NEGATIVE 06/14/2021 2110   Sepsis Labs: @LABRCNTIP (procalcitonin:4,lacticidven:4) ) Recent Results (from the past 240 hour(s))  Blood Culture (routine x 2)     Status: None (Preliminary result)   Collection Time: 06/21/2021  9:19 PM   Specimen: Right Antecubital; Blood  Result Value Ref Range Status   Specimen Description   Final    RIGHT ANTECUBITAL BOTTLES DRAWN AEROBIC AND ANAEROBIC   Special Requests   Final    Blood Culture adequate volume Performed at [facility], [address] [place], [place] [address]    Culture PENDING  Incomplete   Report Status PENDING  Incomplete  Blood Culture (routine x 2)     Status: None (Preliminary result)   Collection Time: 06/18/2021  9:19 PM   Specimen: Left Antecubital; Blood  Result Value Ref Range Status   Specimen Description   Final    LEFT ANTECUBITAL BOTTLES DRAWN AEROBIC AND ANAEROBIC   Special Requests   Final    Blood Culture adequate volume Performed at [facility], [address], [place], [place] [address]    Culture PENDING  Incomplete   Report Status PENDING  Incomplete  Resp Panel by  RT-PCR (Flu A&B, Covid) Nasopharyngeal Swab     Status: None   Collection Time: 06/11/2021  9:23 PM   Specimen: Nasopharyngeal Swab; Nasopharyngeal(NP) swabs in vial transport medium  Result Value Ref Range Status   SARS Coronavirus 2 by RT PCR NEGATIVE NEGATIVE Final    Comment: (NOTE) SARS-CoV-2 target nucleic acids are NOT DETECTED.  The SARS-CoV-2 RNA is generally detectable in upper respiratory specimens during the acute phase of infection. The lowest concentration of SARS-CoV-2 viral copies this assay can detect is 138 copies/mL. A negative result does not preclude SARS-Cov-2 infection and should not be used as the sole basis for treatment or other patient management decisions. A negative result may occur with  improper specimen collection/handling, submission of specimen other than nasopharyngeal swab, presence of viral mutation(s) within  the areas targeted by this assay, and inadequate number of viral copies(<138 copies/mL). A negative result must be combined with clinical observations, patient history, and epidemiological information. The expected result is Negative.  Fact Sheet for Patients:  EntrepreneurPulse.com.au  Fact Sheet for Healthcare Providers:  IncredibleEmployment.be  This test is no t yet approved or cleared by the [place] FDA and  has been authorized for detection and/or diagnosis of SARS-CoV-2 by FDA under an Emergency Use Authorization (EUA). This EUA will remain  in effect (meaning this test can be used) for the duration of the COVID-19 declaration under Section 564(b)(1) of the Act, 21 U.S.C.section 360bbb-3(b)(1), unless the authorization is terminated  or revoked sooner.       Influenza A by PCR NEGATIVE NEGATIVE Final   Influenza B by PCR NEGATIVE NEGATIVE Final    Comment: (NOTE) The Xpert Xpress SARS-CoV-2/FLU/RSV plus assay is intended as an aid in the diagnosis of influenza from Nasopharyngeal swab  specimens and should not be used as a sole basis for treatment. Nasal washings and aspirates are unacceptable for Xpert Xpress SARS-CoV-2/FLU/RSV testing.  Fact Sheet for Patients: EntrepreneurPulse.com.au  Fact Sheet for Healthcare Providers: IncredibleEmployment.be  This test is not yet approved or cleared by the [place] FDA and has been authorized for detection and/or diagnosis of SARS-CoV-2 by FDA under an Emergency Use Authorization (EUA). This EUA will remain in effect (meaning this test can be used) for the duration of the COVID-19 declaration under Section 564(b)(1) of the Act, 21 U.S.C. section 360bbb-3(b)(1), unless the authorization is terminated or revoked.  Performed at [facility], [address] [place], [place] [address]   C Difficile Quick Screen w PCR reflex     Status: None   Collection Time: 07/09/21 12:14 AM   Specimen: Stool  Result Value Ref Range Status   C Diff antigen NEGATIVE NEGATIVE Final   C Diff toxin NEGATIVE NEGATIVE Final   C Diff interpretation No C. difficile detected.  Final    Comment: Performed at [facility], [address], [place], [place] [address]     Scheduled Meds:  atorvastatin  40 mg Oral Daily   Chlorhexidine Gluconate Cloth  6 each Topical Q0600   dextromethorphan-guaiFENesin  1 tablet Oral BID   enoxaparin (LOVENOX) injection  40 mg Subcutaneous Q24H   [START ON 07/12/2021] pantoprazole  40 mg Intravenous Q12H   topiramate  25 mg Oral QHS   Continuous Infusions:  sodium chloride 10 mL/hr at 07/09/21 0101   cefTRIAXone (ROCEPHIN)  IV Stopped (06/16/2021 2241)   doxycycline (VIBRAMYCIN) IV     lactated ringers 150 mL/hr at 07/09/21 0810   pantoprazole 8 mg/hr (07/09/21 0131)    Procedures/Studies: CT Angio Chest Pulmonary Embolism (PE) W or WO Contrast  Result Date: 07/09/2021 CLINICAL DATA:  Positive D-dimer with pulmonary embolism suspected EXAM: CT ANGIOGRAPHY CHEST WITH  CONTRAST TECHNIQUE: Multidetector CT imaging of the chest was performed using the standard protocol during bolus administration of intravenous contrast. Multiplanar CT image reconstructions and MIPs were obtained to evaluate the vascular anatomy. CONTRAST:  149mL OMNIPAQUE IOHEXOL 350 MG/ML SOLN COMPARISON:  10/22/2018 FINDINGS: Cardiovascular: Satisfactory opacification of the pulmonary arteries to the segmental level. No evidence of pulmonary embolism when accounting for unavoidable motion artifact. Normal heart size. No pericardial effusion. Main pulmonary is enlarged to 4 cm in diameter. Atheromatous calcification of the aorta. Mediastinum/Nodes: No adenopathy or mass.  Benign-appearing thyroid Lungs/Pleura: Ground-glass opacity bilateral lungs which are relatively low volume. No honeycombing  noted. No edema, effusion, or pneumothorax. Upper Abdomen: Negative Musculoskeletal: No acute or aggressive finding Review of the MIP images confirms the above findings. IMPRESSION: 1. Bilateral pneumonia. 2. Negative for pulmonary embolism when accounting for motion artifact. Electronically Signed   By: Jorje Guild M.D.   On: 07/09/2021 04:07   DG Chest Portable 1 View  Result Date: 06/20/2021 CLINICAL DATA:  Shortness of breath.  Sepsis workup. EXAM: PORTABLE CHEST 1 VIEW COMPARISON:  Chest CT with contrast and portable chest both 10/22/2018. FINDINGS: There is mild cardiomegaly, normal caliber central vessels. There is a low inspiration. Interstitial and patchy hazy opacities in the left mid to lower lung field are noted most likely due to pneumonia. The remaining hypoinflated lungs are clear. No pleural effusion is seen. There is calcification of the transverse aorta. Thoracic spondylosis. IMPRESSION: Interstitial and patchy hazy opacities of the left mid to lower lung field, most likely due to pneumonia. This could all be in the upper lobe or could be a combination of upper and lower lobe disease. Clinical  correlation and radiographic follow-up recommended. Limited view of the lung bases due to low inspiration Electronically Signed   By: Telford Nab M.D.   On: 06/21/2021 21:50    Orson Eva, DO  [facility]  If 7PM-7AM, please contact night-coverage [email or web address] Password [email or web address] 07/09/2021, 9:13 AM   LOS: 1 day

## 2021-07-09 NOTE — Progress Notes (Signed)
Upon assessment patient's work of breathing was increased with a respiratory rate sustained in the 40s-50s. Patient became more tachycardic at this time as well. Patient complained the cough was contributing to work of breathing. Per order Hycodan was given. Morphine was also given per MD order following. Neither intervention was effective.   MD was paged. Per MD this RN was instructed to contact [facility]. See orders placed. [place] following.

## 2021-07-09 NOTE — Progress Notes (Signed)
Respiratory this morning placed patient on Bipap. After 1 hour she stated she could not tolerate the mask and removed it. Placed on nasal cannula and oxygen saturations would only increase to 85%. I was able to talk patient into placing bipap back on in hopes to get saturations at 90% or better.

## 2021-07-09 NOTE — Progress Notes (Addendum)
[facility] Progress Note Patient Name: VALORA NORELL DOB: 11/12/1950 MRN: [id number]   Date of Service  07/09/2021  HPI/Events of Note  Red alert  Camera:  Discussed with RN. Increased RR, 50 % fio2 on BiPAP 14/6/20. Sats 97%. HR 110.  Temp: afebrile now  7.46/22/55/19 earlier.    Pneumonia/AHRF. Covid neg  eICU Interventions  - start precedex for anxiety trial - ABG/CxR stat. Asp precautions     Intervention Category Intermediate Interventions: Respiratory distress - evaluation and management  Elmer Sow 07/09/2021, 9:32 PM  00:01 CxR stable bi bibasal air space densities. Abg stable. Discussed with RN. Received already Mag replacement from bed side MD.  - ordered Kcl 10 meq x 4 doses. Follow AM labs.  Precedex helping .

## 2021-07-10 ENCOUNTER — Inpatient Hospital Stay (HOSPITAL_COMMUNITY): Payer: Medicare Other

## 2021-07-10 DIAGNOSIS — I5030 Unspecified diastolic (congestive) heart failure: Secondary | ICD-10-CM

## 2021-07-10 DIAGNOSIS — C3490 Malignant neoplasm of unspecified part of unspecified bronchus or lung: Secondary | ICD-10-CM

## 2021-07-10 DIAGNOSIS — K922 Gastrointestinal hemorrhage, unspecified: Secondary | ICD-10-CM

## 2021-07-10 LAB — ECHOCARDIOGRAM COMPLETE
AR max vel: 2.28 cm2
AV Area VTI: 2.16 cm2
AV Area mean vel: 2.15 cm2
AV Mean grad: 5 mmHg
AV Peak grad: 9.5 mmHg
Ao pk vel: 1.54 m/s
Area-P 1/2: 3.24 cm2
Calc EF: 68.2 %
Height: 63 in
MV VTI: 2.16 cm2
S' Lateral: 2.8 cm
Single Plane A2C EF: 69.4 %
Single Plane A4C EF: 67.7 %
Weight: 2137.58 oz

## 2021-07-10 LAB — CBC
HCT: 25.4 % — ABNORMAL LOW (ref 36.0–46.0)
Hemoglobin: 8.2 g/dL — ABNORMAL LOW (ref 12.0–15.0)
MCH: 27.9 pg (ref 26.0–34.0)
MCHC: 32.3 g/dL (ref 30.0–36.0)
MCV: 86.4 fL (ref 80.0–100.0)
Platelets: 161 10*3/uL (ref 150–400)
RBC: 2.94 MIL/uL — ABNORMAL LOW (ref 3.87–5.11)
RDW: 16.2 % — ABNORMAL HIGH (ref 11.5–15.5)
WBC: 15.2 10*3/uL — ABNORMAL HIGH (ref 4.0–10.5)
nRBC: 0 % (ref 0.0–0.2)

## 2021-07-10 LAB — COMPREHENSIVE METABOLIC PANEL
ALT: 23 U/L (ref 0–44)
AST: 70 U/L — ABNORMAL HIGH (ref 15–41)
Albumin: 2.5 g/dL — ABNORMAL LOW (ref 3.5–5.0)
Alkaline Phosphatase: 61 U/L (ref 38–126)
Anion gap: 10 (ref 5–15)
BUN: 10 mg/dL (ref 8–23)
CO2: 18 mmol/L — ABNORMAL LOW (ref 22–32)
Calcium: 8.1 mg/dL — ABNORMAL LOW (ref 8.9–10.3)
Chloride: 111 mmol/L (ref 98–111)
Creatinine, Ser: 0.98 mg/dL (ref 0.44–1.00)
GFR, Estimated: >60 mL/min (ref >60)
Glucose, Bld: 111 mg/dL — ABNORMAL HIGH (ref 70–99)
Potassium: 3.9 mmol/L (ref 3.5–5.1)
Sodium: 139 mmol/L (ref 135–145)
Total Bilirubin: 0.6 mg/dL (ref 0.3–1.2)
Total Protein: 6.7 g/dL (ref 6.5–8.1)

## 2021-07-10 LAB — URINE CULTURE: Culture: NO GROWTH

## 2021-07-10 LAB — GASTROINTESTINAL PANEL BY PCR, STOOL (REPLACES STOOL CULTURE)

## 2021-07-10 LAB — MAGNESIUM: Magnesium: 2.2 mg/dL (ref 1.7–2.4)

## 2021-07-10 LAB — BLOOD GAS, ARTERIAL
Acid-base deficit: 5.9 mmol/L — ABNORMAL HIGH (ref 0.0–2.0)
Bicarbonate: 19.6 mmol/L — ABNORMAL LOW (ref 20.0–28.0)
Drawn by: 38235
FIO2: 100
O2 Saturation: 94.5 %
Patient temperature: 37
pCO2 arterial: 35.2 mmHg (ref 32.0–48.0)
pH, Arterial: 7.346 — ABNORMAL LOW (ref 7.350–7.450)
pO2, Arterial: 81.4 mmHg — ABNORMAL LOW (ref 83.0–108.0)

## 2021-07-10 LAB — GLUCOSE, CAPILLARY: Glucose-Capillary: 116 mg/dL — ABNORMAL HIGH (ref 70–99)

## 2021-07-10 MED ORDER — LEVALBUTEROL HCL 0.63 MG/3ML IN NEBU
INHALATION_SOLUTION | RESPIRATORY_TRACT | Status: AC
  Administered 2021-07-10: 0.63 mg
  Filled 2021-07-10: qty 3

## 2021-07-10 MED ORDER — POTASSIUM CHLORIDE 10 MEQ/100ML IV SOLN
10.0000 meq | INTRAVENOUS | Status: AC
  Administered 2021-07-10 (×4): 10 meq via INTRAVENOUS
  Filled 2021-07-10 (×4): qty 100

## 2021-07-10 MED ORDER — ACETAMINOPHEN 500 MG PO TABS
1000.0000 mg | ORAL_TABLET | Freq: Four times a day (QID) | ORAL | Status: DC
  Administered 2021-07-10 – 2021-07-11 (×4): 1000 mg via ORAL
  Filled 2021-07-10 (×7): qty 2

## 2021-07-10 MED ORDER — FUROSEMIDE 10 MG/ML IJ SOLN
40.0000 mg | Freq: Once | INTRAMUSCULAR | Status: AC
  Administered 2021-07-10: 40 mg via INTRAVENOUS
  Filled 2021-07-10: qty 4

## 2021-07-10 MED ORDER — PANTOPRAZOLE SODIUM 40 MG IV SOLR
INTRAVENOUS | Status: AC
  Filled 2021-07-10: qty 80

## 2021-07-10 MED ORDER — ACETAMINOPHEN 325 MG PO TABS: 650.0000 mg | ORAL_TABLET | Freq: Four times a day (QID) | ORAL | Status: DC

## 2021-07-10 MED ORDER — MORPHINE SULFATE (PF) 2 MG/ML IV SOLN
1.0000 mg | INTRAVENOUS | Status: DC | PRN
  Administered 2021-07-10 – 2021-07-11 (×6): 1 mg via INTRAVENOUS
  Filled 2021-07-10 (×7): qty 1

## 2021-07-10 NOTE — Progress Notes (Signed)
*  PRELIMINARY RESULTS* Echocardiogram 2D Echocardiogram has been performed.  Elpidio Anis 07/10/2021, 11:41 AM

## 2021-07-10 NOTE — Progress Notes (Signed)
Gave neb with atrovent add xopenex for wheezes

## 2021-07-10 NOTE — Progress Notes (Signed)
PROGRESS NOTE  CATALEYAH COLBORN SJG:[id number] DOB: 06-02-1951 DOA: 06/11/2021 PCP: Celene Squibb, MD  Brief History:  71 year old female with a history of tobacco abuse, COPD, hypertension, hyperlipidemia, depression presenting with 2-day history of shortness of breath with associated nausea, vomiting, and diarrhea.  The patient also has subjective fevers and chills at home.  History is limited due to the patient's extremis.  Upon EMS arrival, she was noted to have oxygen saturation in the 80% range on room air.  Initial work-up showed a fever of 102.4 F, but she was hemodynamically stable.  She was placed on high flow nasal cannula up to 7 L.  WBC 16.7, hemoglobin 1.3, platelets 239,000.  Sodium 139, potassium 2.9, bicarbonate 17, BUN 12, creatinine 0.91.  The patient was initially started on ceftriaxone and azithromycin.  Apparently, the patient has noted some melanotic stool.  FOBT was positive.   Assessment/Plan: Sepsis -Present on admission -Secondary to pneumonia -Presented with fever, leukocytosis, and respiratory failure -Lactic acid peaked 1.7 -PCT 0.44 -follow blood culture--neg to date -UA--no pyuria   Lobar pneumonia -CTA chest negative for PE, pulmonary artery with mild enlargement; bilateral GGO -Suspect component of aspiration pneumonitis given her recurrent vomiting -Continue Zosyn -MRSA screen--neg -continue doxy   Acute respiratory failure with hypoxia -pt requiring BiPAP -wean as tolerated for saturation >90% -due to pneumonia in setting of underlying COPD -COVID neg -12/31 evening--resp distress>>CCM started low dose precedex -1/1--personally reviewed CXR>>pulmonary edema -1/1-->lasix IV x 1 -Echo   Essential hypertension -Holding antihypertensive agents at this time and monitor clinically   Hypokalemia/Hypomagnesemia -Repleted   Diarrhea -cdiff neg -GI pathogen panel--neg   Hyperlipidemia -continue statin   FOBT positive -not stable  presently for endoscopic eval -continue protonix -SCDs   Chronic Pain syndrome -she is on buprenorphine patch 7.5 mcg/hr and lyrica             Family Communication:  no  Family at bedside   Consultants:  none   Code Status:  FULL    DVT Prophylaxis:  SCDs     Procedures: As Listed in Progress Note Above   Antibiotics: Zosyn 12/31>> Doxy 12/31>>  The patient is critically ill with multiple organ systems failure and requires high complexity decision making for assessment and support, frequent evaluation and titration of therapies, application of advanced monitoring technologies and extensive interpretation of multiple databases.  Critical care time - 35 mins.      Subjective: Patient is awake on bipap.  Tolerating well with low dose precedex.  Denies f/c, cp, n/v/d, abd pain  Objective: Vitals:   07/10/21 0400 07/10/21 0500 07/10/21 0600 07/10/21 0700  BP: (!) 121/56 136/68 (!) 125/58 (!) 123/55  Pulse: 84 81 79 77  Resp: (!) 35 (!) 29 (!) 36 (!) 34  Temp:      TempSrc:      SpO2: 100% 100% 100% 98%  Weight:      Height:        Intake/Output Summary (Last 24 hours) at 07/10/2021 0909 Last data filed at 07/10/2021 0615 Gross per 24 hour  Intake 2066.76 ml  Output 1600 ml  Net 466.76 ml   Weight change:  Exam:  General:  Pt is alert, follows commands appropriately, not in acute distress HEENT: No icterus, No thrush, No neck mass, [place]/AT Cardiovascular: RRR, S1/S2, no rubs, no gallops Respiratory: bilateral rales.  No wheeze Abdomen: Soft/+BS, non tender, non distended, no guarding Extremities: No  edema, No lymphangitis, No petechiae, No rashes, no synovitis   Data Reviewed: I have personally reviewed following labs and imaging studies Basic Metabolic Panel: Recent Labs  Lab 06/17/2021 2116 06/25/2021 2117 07/09/21 0534 07/09/21 2149 07/10/21 0428  NA 139  --  139  --  139  K 2.9*  --  2.9* 2.9* 3.9  CL 109  --  110  --  111  CO2 17*  --  17*  --   18*  GLUCOSE 143*  --  137*  --  111*  BUN 18  --  12  --  10  CREATININE 1.14*  --  0.91  --  0.98  CALCIUM 9.2  --  8.4*  --  8.1*  MG  --  2.0 1.7 1.5* 2.2  PHOS  --   --  2.5  --   --    Liver Function Tests: Recent Labs  Lab 07/05/2021 2116 07/09/21 0534 07/10/21 0428  AST 64* 63* 70*  ALT 29 27 23   ALKPHOS 62 57 61  BILITOT 1.0 0.5 0.6  PROT 8.7* 7.4 6.7  ALBUMIN 3.3* 2.8* 2.5*   No results for input(s): LIPASE, AMYLASE in the last 168 hours. No results for input(s): AMMONIA in the last 168 hours. Coagulation Profile: Recent Labs  Lab 07/07/2021 2116  INR 1.2   CBC: Recent Labs  Lab 06/29/2021 2117 07/09/21 0534 07/10/21 0428  WBC 16.7* 19.2* 15.2*  HGB 11.3* 9.9* 8.2*  HCT 33.9* 30.5* 25.4*  MCV 86.5 85.7 86.4  PLT 239 198 161   Cardiac Enzymes: No results for input(s): CKTOTAL, CKMB, CKMBINDEX, TROPONINI in the last 168 hours. BNP: Invalid input(s): POCBNP CBG: Recent Labs  Lab 06/26/2021 2122 07/10/21 0745  GLUCAP 144* 116*   HbA1C: No results for input(s): HGBA1C in the last 72 hours. Urine analysis:    Component Value Date/Time   COLORURINE YELLOW 07/03/2021 2110   APPEARANCEUR CLEAR 07/07/2021 2110   LABSPEC 1.015 06/17/2021 2110   PHURINE 5.0 07/04/2021 2110   GLUCOSEU NEGATIVE 06/25/2021 2110   HGBUR MODERATE (A) 06/10/2021 2110   HGBUR moderate 11/11/2008 [place] 07/01/2021 2110   KETONESUR 5 (A) 06/29/2021 2110   PROTEINUR 100 (A) 06/17/2021 2110   UROBILINOGEN 0.2 11/11/2008 0921   NITRITE NEGATIVE 07/04/2021 2110   LEUKOCYTESUR NEGATIVE 06/15/2021 2110   Sepsis Labs: @LABRCNTIP (procalcitonin:4,lacticidven:4) ) Recent Results (from the past 240 hour(s))  Urine Culture     Status: None   Collection Time: 06/09/2021  9:16 PM   Specimen: In/Out Cath Urine  Result Value Ref Range Status   Specimen Description   Final    IN/OUT CATH URINE Performed at [facility], [address] [place], [place] [address]     Special Requests   Final    NONE Performed at [facility], [address], [place], [place] [address]    Culture   Final    NO GROWTH Performed at [facility], [place] [address] [place], [place] [address]    Report Status 07/10/2021 FINAL  Final  Blood Culture (routine x 2)     Status: None (Preliminary result)   Collection Time: 06/13/2021  9:19 PM   Specimen: Right Antecubital; Blood  Result Value Ref Range Status   Specimen Description   Final    RIGHT ANTECUBITAL BOTTLES DRAWN AEROBIC AND ANAEROBIC   Special Requests Blood Culture adequate volume  Final   Culture   Final    NO GROWTH 2  DAYS Performed at [facility], [address] [place], [place] [address]    Report Status PENDING  Incomplete  Blood Culture (routine x 2)     Status: None (Preliminary result)   Collection Time: 07/02/2021  9:19 PM   Specimen: Left Antecubital; Blood  Result Value Ref Range Status   Specimen Description   Final    LEFT ANTECUBITAL BOTTLES DRAWN AEROBIC AND ANAEROBIC   Special Requests Blood Culture adequate volume  Final   Culture   Final    NO GROWTH 2 DAYS Performed at [facility], [address], [place], [place] [address]    Report Status PENDING  Incomplete  Resp Panel by RT-PCR (Flu A&B, Covid) Nasopharyngeal Swab     Status: None   Collection Time: 06/10/2021  9:23 PM   Specimen: Nasopharyngeal Swab; Nasopharyngeal(NP) swabs in vial transport medium  Result Value Ref Range Status   SARS Coronavirus 2 by RT PCR NEGATIVE NEGATIVE Final    Comment: (NOTE) SARS-CoV-2 target nucleic acids are NOT DETECTED.  The SARS-CoV-2 RNA is generally detectable in upper respiratory specimens during the acute phase of infection. The lowest concentration of SARS-CoV-2 viral copies this assay can detect is 138 copies/mL. A negative result does not preclude SARS-Cov-2 infection and should not be used as the sole basis for treatment or other patient management decisions. A negative result may occur  with  improper specimen collection/handling, submission of specimen other than nasopharyngeal swab, presence of viral mutation(s) within the areas targeted by this assay, and inadequate number of viral copies(<138 copies/mL). A negative result must be combined with clinical observations, patient history, and epidemiological information. The expected result is Negative.  Fact Sheet for Patients:  EntrepreneurPulse.com.au  Fact Sheet for Healthcare Providers:  IncredibleEmployment.be  This test is no t yet approved or cleared by the [place] FDA and  has been authorized for detection and/or diagnosis of SARS-CoV-2 by FDA under an Emergency Use Authorization (EUA). This EUA will remain  in effect (meaning this test can be used) for the duration of the COVID-19 declaration under Section 564(b)(1) of the Act, 21 U.S.C.section 360bbb-3(b)(1), unless the authorization is terminated  or revoked sooner.       Influenza A by PCR NEGATIVE NEGATIVE Final   Influenza B by PCR NEGATIVE NEGATIVE Final    Comment: (NOTE) The Xpert Xpress SARS-CoV-2/FLU/RSV plus assay is intended as an aid in the diagnosis of influenza from Nasopharyngeal swab specimens and should not be used as a sole basis for treatment. Nasal washings and aspirates are unacceptable for Xpert Xpress SARS-CoV-2/FLU/RSV testing.  Fact Sheet for Patients: EntrepreneurPulse.com.au  Fact Sheet for Healthcare Providers: IncredibleEmployment.be  This test is not yet approved or cleared by the [place] FDA and has been authorized for detection and/or diagnosis of SARS-CoV-2 by FDA under an Emergency Use Authorization (EUA). This EUA will remain in effect (meaning this test can be used) for the duration of the COVID-19 declaration under Section 564(b)(1) of the Act, 21 U.S.C. section 360bbb-3(b)(1), unless the authorization is terminated  or revoked.  Performed at [facility], [address], [place], [place] [address]   C Difficile Quick Screen w PCR reflex     Status: None   Collection Time: 07/09/21 12:14 AM   Specimen: Stool  Result Value Ref Range Status   C Diff antigen NEGATIVE NEGATIVE Final   C Diff toxin NEGATIVE NEGATIVE Final   C Diff interpretation No C. difficile detected.  Final    Comment:  Performed at [facility], [address], [place], [place] [address]  Gastrointestinal Panel by PCR , Stool     Status: None   Collection Time: 07/09/21 12:14 AM   Specimen: Stool  Result Value Ref Range Status   Campylobacter species NOT DETECTED NOT DETECTED Final   Plesimonas shigelloides NOT DETECTED NOT DETECTED Final   Salmonella species NOT DETECTED NOT DETECTED Final   Yersinia enterocolitica NOT DETECTED NOT DETECTED Final   Vibrio species NOT DETECTED NOT DETECTED Final   Vibrio cholerae NOT DETECTED NOT DETECTED Final   Enteroaggregative E coli (EAEC) NOT DETECTED NOT DETECTED Final   Enteropathogenic E coli (EPEC) NOT DETECTED NOT DETECTED Final   Enterotoxigenic E coli (ETEC) NOT DETECTED NOT DETECTED Final   Shiga like toxin producing E coli (STEC) NOT DETECTED NOT DETECTED Final   Shigella/Enteroinvasive E coli (EIEC) NOT DETECTED NOT DETECTED Final   Cryptosporidium NOT DETECTED NOT DETECTED Final   Cyclospora cayetanensis NOT DETECTED NOT DETECTED Final   Entamoeba histolytica NOT DETECTED NOT DETECTED Final   Giardia lamblia NOT DETECTED NOT DETECTED Final   Adenovirus F40/41 NOT DETECTED NOT DETECTED Final   Astrovirus NOT DETECTED NOT DETECTED Final   Norovirus GI/GII NOT DETECTED NOT DETECTED Final   Rotavirus A NOT DETECTED NOT DETECTED Final   Sapovirus (I, II, IV, and V) NOT DETECTED NOT DETECTED Final    Comment: Performed at [facility], [address] [place], [place] [address]  MRSA Next Gen by PCR, Nasal     Status: None   Collection Time: 07/09/21  3:13 AM   Specimen:  Nasal Mucosa; Nasal Swab  Result Value Ref Range Status   MRSA by PCR Next Gen NOT DETECTED NOT DETECTED Final    Comment: (NOTE) The GeneXpert MRSA Assay (FDA approved for NASAL specimens only), is one component of a comprehensive MRSA colonization surveillance program. It is not intended to diagnose MRSA infection nor to guide or monitor treatment for MRSA infections. Test performance is not FDA approved in patients less than 73 years old. Performed at [facility], [address] [place], [place] [address]      Scheduled Meds:  atorvastatin  40 mg Oral Daily   Chlorhexidine Gluconate Cloth  6 each Topical Q0600   etomidate       fentaNYL       furosemide  40 mg Intravenous Once   ketamine HCl       midazolam       [START ON 07/12/2021] pantoprazole  40 mg Intravenous Q12H   rocuronium bromide       succinylcholine       Continuous Infusions:  sodium chloride 10 mL/hr at 07/09/21 0101   dexmedetomidine (PRECEDEX) IV infusion 1 mcg/kg/hr (07/10/21 0403)   doxycycline (VIBRAMYCIN) IV Stopped (07/09/21 2235)   pantoprazole 8 mg/hr (07/10/21 8299)   piperacillin-tazobactam (ZOSYN)  IV 3.375 g (07/10/21 0615)    Procedures/Studies: CT Angio Chest Pulmonary Embolism (PE) W or WO Contrast  Result Date: 07/09/2021 CLINICAL DATA:  Positive D-dimer with pulmonary embolism suspected EXAM: CT ANGIOGRAPHY CHEST WITH CONTRAST TECHNIQUE: Multidetector CT imaging of the chest was performed using the standard protocol during bolus administration of intravenous contrast. Multiplanar CT image reconstructions and MIPs were obtained to evaluate the vascular anatomy. CONTRAST:  172mL OMNIPAQUE IOHEXOL 350 MG/ML SOLN COMPARISON:  10/22/2018 FINDINGS: Cardiovascular: Satisfactory opacification of the pulmonary arteries to the segmental level. No evidence of pulmonary embolism when accounting for unavoidable motion artifact. Normal heart  size. No pericardial effusion. Main pulmonary is enlarged to 4 cm  in diameter. Atheromatous calcification of the aorta. Mediastinum/Nodes: No adenopathy or mass.  Benign-appearing thyroid Lungs/Pleura: Ground-glass opacity bilateral lungs which are relatively low volume. No honeycombing noted. No edema, effusion, or pneumothorax. Upper Abdomen: Negative Musculoskeletal: No acute or aggressive finding Review of the MIP images confirms the above findings. IMPRESSION: 1. Bilateral pneumonia. 2. Negative for pulmonary embolism when accounting for motion artifact. Electronically Signed   By: Jorje Guild M.D.   On: 07/09/2021 04:07   DG CHEST PORT 1 VIEW  Result Date: 07/09/2021 CLINICAL DATA:  Worsening SOB. Hx of bronchitis, COPD, HTN, pneumonia, and current smoker of 0.25 packs a day x 42 years. Negative covid, flu, and RSV yesterday. EXAM: PORTABLE CHEST 1 VIEW COMPARISON:  CT angio chest 07/09/2021, chest x-ray 06/27/2021 FINDINGS: The heart and mediastinal contours are unchanged. Aortic calcification. Interval increased interstitial and airspace opacities with prominence within the mid to lower lungs. No pleural effusion. No pneumothorax. No acute osseous abnormality. IMPRESSION: Interval increase in marked interstitial and airspace opacities with prominence within the mid to lower lungs. Findings consistent with pulmonary edema. Electronically Signed   By: Iven Finn M.D.   On: 07/09/2021 22:13   DG Chest Portable 1 View  Result Date: 06/26/2021 CLINICAL DATA:  Shortness of breath.  Sepsis workup. EXAM: PORTABLE CHEST 1 VIEW COMPARISON:  Chest CT with contrast and portable chest both 10/22/2018. FINDINGS: There is mild cardiomegaly, normal caliber central vessels. There is a low inspiration. Interstitial and patchy hazy opacities in the left mid to lower lung field are noted most likely due to pneumonia. The remaining hypoinflated lungs are clear. No pleural effusion is seen. There is calcification of the transverse aorta. Thoracic spondylosis. IMPRESSION:  Interstitial and patchy hazy opacities of the left mid to lower lung field, most likely due to pneumonia. This could all be in the upper lobe or could be a combination of upper and lower lobe disease. Clinical correlation and radiographic follow-up recommended. Limited view of the lung bases due to low inspiration Electronically Signed   By: Telford Nab M.D.   On: 07/01/2021 21:50    Orson Eva, DO  [facility]  If 7PM-7AM, please contact night-coverage [email or web address] Password [email or web address] 07/10/2021, 9:09 AM   LOS: 2 days

## 2021-07-10 NOTE — Consult Note (Signed)
Referring Provider: Orson Eva, DO Primary Care Physician:  Celene Squibb, MD Primary Gastroenterologist:  Dr. Laural Golden  Reason for Consultation:    Melena.  HPI:   Patient is 71 year old Afro-American female with multiple medical problems including COPD GERD hyperlipidemia hypertension migraine PTSD who presented to emergency room with shortness of breath and weakness 2 days ago.  Chest film revealed interstitial and patchy hazy opacities in left mid and lower lung felt to be due to pneumonia.  Respiratory panel was negative.  Serum lactic acid was 1.7.  D-dimer was mildly elevated.  Strep pneumoniae urinary antigen.  Testing for Legionella is pending.  Chemistry panel was pertinent for elevated serum creatinine of 1.14 serum potassium of 2.9 glucose of 143 CO2 of 17 albumin of 3.3 AST of 64 with ALT of 29.  CBC revealed leukocytosis with WBC of 16.7 and H&H of 11.3 and 33.9.  Sputum urine and blood cultures were obtained.  She is admitted to ICU with respiratory failure and begun on Zosyn. She CTA chest yesterday and was negative for pulmonary embolism but revealed bilateral pneumonia.  She was begun on BiPAP.  Patient was noted to have heme positive stool at the time of admission.  Patient does give history of tarry stools for the past few days prior to admission.  She has been on ferrous sulfate.  Patient states her heartburn is well controlled with omeprazole.  She does not take aspirin or OTC NSAIDs.  She also denies abdominal pain nausea vomiting dysphagia or rectal bleeding.    Prior GI work-up as follows  She underwent esophagogastroduodenoscopy by Dr. Abbey Chatters on 02/24/2020 for weight loss and early satiety.  She had gastritis but no evidence of peptic ulcer disease. Duodenal biopsy was unremarkable.  Antral gastric biopsy revealed mild nonspecific reactive changes and gastric oxyntic mucosa with parietal cell hyperplasia.  H. pylori stains were negative.  She also had EGD with dilation by Dr.  Lovey Newcomer feels in August 2016 followed by colonoscopy for screening purposes and was normal other than small internal hemorrhoids.  She also had small bowel given capsule study in April 2018 for GI blood loss.  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  Patient is married but separated.  She has 1 daughter in good health.  She is on disability. She does not drink alcohol.  She has been smoking since she was 71 years old.  (Smoking one fourth of a pack and doing her best to quit.  Mother died of MI possibly in her 48s or 72s.  Father died of some type of cancer in his 96s. She has 2 sisters and 1 brother.  Brother is in good health.  1 sister is a nursing home and another sister has end-stage renal disease on dialysis.     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 the examined terminal ileum appeared to be normal 2. the colonic mucosa appeared normal 3. small internal hemorrhoids   ESOPHAGOGASTRODUODENOSCOPY (EGD) WITH PROPOFOL N/A 02/09/2015   SLF: 1. stricture at the gastroesophageal junction 2. mild non-erosive  gastritis    ESOPHAGOGASTRODUODENOSCOPY (EGD) WITH PROPOFOL N/A 02/24/2020   Procedure: ESOPHAGOGASTRODUODENOSCOPY (EGD) WITH PROPOFOL;  Surgeon: Eloise Harman, DO;  Gastritis s/p biopsy (nonspecific reactive gastropathy), normal examined duodenum s/p biopsy (benign).   GIVENS CAPSULE STUDY N/A 10/19/2016   Procedure: GIVENS CAPSULE STUDY;  Surgeon: Danie Binder, MD;  Location: [facility];  Service: Endoscopy;  Laterality: N/A;  7:30am, pt to arrive at 7:00am   left arm     ? nerve repaired, " it was pinched".   left elbow surgery Left    SAVORY DILATION N/A 02/09/2015   Procedure: SAVORY DILATION 12.65mm, 62mm, 14mm, 90mm;  Surgeon: Danie Binder, MD;  Location: [facility];  Service: Endoscopy;  Laterality: N/A;   TONSILLECTOMY     TUBAL LIGATION      Prior to Admission medications   Medication Sig Start Date End Date Taking? Authorizing Provider  albuterol (PROVENTIL) (2.5 MG/3ML) 0.083% nebulizer solution INHALE 1 VIAL VIA NEBULIZER EVERY 6 HOURS AS NEEDED FOR WHEEZING AND SHORTNESS OF BREATH 12/25/16  Yes Soyla Dryer, PA-C  albuterol (VENTOLIN HFA) 108 (90 Base) MCG/ACT inhaler Inhale 1 puff into the lungs every 6 (six) hours as needed for wheezing or shortness of breath.  01/06/19  Yes [provider]  amLODipine (NORVASC) 10 MG tablet Take 10 mg by mouth daily.   Yes [provider]  Ascorbic Acid (VITAMIN C PO) Take 1 tablet by mouth daily.   Yes [provider]  atorvastatin (LIPITOR) 40 MG tablet Take 40 mg by mouth daily.  04/09/19  Yes [provider]  buprenorphine (BUTRANS) 7.5 MCG/HR Place 1 patch onto the skin once a week.   Yes [provider]  butalbital-acetaminophen-caffeine (FIORICET) 50-325-40 MG tablet Take 1 tablet by mouth 2 (two) times daily as needed. 01/11/21  Yes [provider]  Calcium-Magnesium-Zinc (CAL-MAG-ZINC PO) Take 1 tablet by mouth daily.    Yes [provider]  ferrous sulfate 325 (65 FE)  MG tablet Take 325 mg by mouth daily with breakfast.   Yes [provider]  furosemide (LASIX) 20 MG tablet TAKE ONE TABLET BY mouth daily prn 08/13/20  Yes Branch, Alphonse Guild, MD  Multiple Vitamin (MULTIVITAMIN) tablet Take 1 tablet by mouth daily.   Yes [provider]  nitroGLYCERIN (NITROSTAT) 0.4 MG SL tablet Place 0.4 mg under the tongue every 5 (five) minutes as needed for chest pain.   Yes [provider]  omeprazole (PRILOSEC) 20 MG capsule TAKE 1 CAPSULE BY MOUTH DAILY BEFORE A MEAL. Patient taking differently: Take 20 mg by mouth as needed. 05/15/18  Yes Mahala Menghini, PA-C  pregabalin (LYRICA) 75 MG capsule Take 75 mg by mouth daily. 01/10/21  Yes [provider]  propranolol (INDERAL) 80 MG tablet Take 1 tablet (80 mg total) by mouth 2 (two) times daily.  10/30/17  Yes Raylene Everts, MD  tiZANidine (ZANAFLEX) 2 MG tablet Take 1-2 tablets by mouth every 8 (eight) hours as needed. 04/14/19  Yes [provider]  topiramate (TOPAMAX) 25 MG tablet Take 25 mg by mouth at bedtime.   Yes [provider]  traMADol (ULTRAM) 50 MG tablet Take 50-100 mg by mouth every 6 (six) hours as needed.   Yes [provider]  traZODone (DESYREL) 50 MG tablet Take 50-75 mg by mouth at bedtime as needed for sleep.   Yes [provider]    Current Facility-Administered Medications  Medication Dose Route Frequency Provider Last Rate Last Admin   0.9 %  sodium chloride infusion   Intravenous PRN Adefeso, Oladapo, DO 10 mL/hr at 07/09/21 0101 New Bag at 07/09/21 0101   acetaminophen (TYLENOL) tablet 650 mg  650 mg Oral Q6H PRN Adefeso, Oladapo, DO   650 mg at 07/09/21 1648   atorvastatin (LIPITOR) tablet 40 mg  40 mg Oral Daily Adefeso, Oladapo, DO   40 mg at 07/09/21 5701   butalbital-acetaminophen-caffeine (FIORICET) 50-325-40 MG per tablet 1 tablet  1 tablet Oral BID PRN Adefeso, Oladapo, DO       Chlorhexidine Gluconate Cloth 2 % PADS 6  each  6 each Topical Q0600 Adefeso, Oladapo, DO   6 each at 07/09/21 0337   dexmedetomidine (PRECEDEX) 400 MCG/100ML (4 mcg/mL) infusion  0.1-1 mcg/kg/hr Intravenous Titrated Adefeso, Oladapo, DO 15.15 mL/hr at 07/10/21 1057 1 mcg/kg/hr at 07/10/21 1057   doxycycline (VIBRAMYCIN) 100 mg in sodium chloride 0.9 % 250 mL IVPB  100 mg Intravenous Q12H Adefeso, Oladapo, DO 125 mL/hr at 07/10/21 0954 100 mg at 07/10/21 0954   HYDROcodone bit-homatropine (HYCODAN) 5-1.5 MG/5ML syrup 5 mL  5 mL Oral Q4H PRN Tat, Shanon Brow, MD   5 mL at 07/10/21 1154   ipratropium (ATROVENT) nebulizer solution 0.5 mg  0.5 mg Nebulization Q6H PRN Adefeso, Oladapo, DO   0.5 mg at 07/09/21 0801   morphine 2 MG/ML injection 1 mg  1 mg Intravenous Q4H PRN Tat, Shanon Brow, MD       Derrill Memo ON 07/12/2021] pantoprazole (PROTONIX) injection 40 mg  40 mg Intravenous Q12H Adefeso, Oladapo, DO       pantoprozole (PROTONIX) 80 mg /NS 100 mL infusion  8 mg/hr Intravenous Continuous Adefeso, Oladapo, DO 10 mL/hr at 07/10/21 0614 8 mg/hr at 07/10/21 0614   piperacillin-tazobactam (ZOSYN) IVPB 3.375 g  3.375 g Intravenous Franco Collet, MD 12.5 mL/hr at 07/10/21 0615 3.375 g at 07/10/21 0615   prochlorperazine (COMPAZINE) injection 10 mg  10 mg Intravenous Q6H PRN Adefeso, Oladapo, DO        Allergies as of 06/27/2021 - Review Complete 06/30/2021  Allergen Reaction Noted   Asa [aspirin] Other (See Comments) 11/11/2008   Ibuprofen  05/23/2019    Family History  Problem Relation Age of Onset   Heart failure Father    Hypertension Father    Stroke Father    Colon cancer Father        greater than age 21   Cancer Father    Dementia Father    Cancer Mother    Seizures Sister    Inflammatory bowel disease Neg Hx    Liver disease Neg Hx     Social History   Socioeconomic History   Marital status: Legally Separated    Spouse name: Cheno   Number of children: 1   Years of education: Not on file   Highest education  level: Not on file   Occupational History   Occupation: disability    Comment: lung disease / COPD  Tobacco Use   Smoking status: Every Day    Packs/day: 0.25    Years: 42.00    Pack years: 10.50    Types: Cigarettes    Start date: 09/29/1972   Smokeless tobacco: Never   Tobacco comments:    about 2 cigs per day  Vaping Use   Vaping Use: Never used  Substance and Sexual Activity   Alcohol use: No    Alcohol/week: 0.0 standard drinks   Drug use: Not Currently    Types: "Crack" cocaine, Marijuana    Comment: hx of smoking crack cocaine last 5-6 years ago; 02/16/21 occ marijuana; 06/20/21 denied   Sexual activity: Never    Birth control/protection: None  Other Topics Concern   Not on file  Social History Narrative   Married to [employer] for 28 years   Keep her sister - she has seizures/disabled   Social Determinants of Radio broadcast assistant Strain: Not on file  Food Insecurity: Not on file  Transportation Needs: Not on file  Physical Activity: Not on file  Stress: Not on file  Social Connections: Not on file  Intimate Partner Violence: Not on file    Review of Systems: See HPI, otherwise normal ROS  Physical Exam: Temp:  [98.4 F (36.9 C)-101.3 F (38.5 C)] 98.9 F (37.2 C) (01/01 1200) Pulse Rate:  [69-113] 69 (01/01 1118) Resp:  [19-40] 23 (01/01 1118) BP: (111-148)/(33-76) 119/60 (01/01 1100) SpO2:  [79 %-100 %] 100 % (01/01 1118) FiO2 (%):  [50 %-100 %] 90 % (01/01 1118) Last BM Date: 07/09/21  Patient is alert.  She is on BiPAP but able to answer questions. She is in mild respiratory distress. Conjunctiva is pink.  Sclera is nonicteric but muddy. Oropharyngeal examination not possible as she is on BiPAP. No neck masses or thyromegaly noted. Cardiac exam with regular rhythm normal S1 and S2.  No murmur or gallop noted. Auscultation lungs reveal rhonchi bilaterally and she has rales at left base. Abdomen is symmetrical.  She has Pfannenstiel scar.  Bowel sounds are normal.   On palpation abdomen is soft and nontender with organomegaly or masses. No peripheral edema or clubbing noted.  Intake/Output from previous day: 12/31 0701 - 01/01 0700 In: 3912.2 [I.V.:2330.2; IV Piggyback:1582.1] Out: 1600 [Urine:1600] Intake/Output this shift: Total I/O In: 354.7 [I.V.:174.2; IV Piggyback:180.6] Out: 1250 [Urine:1250]  Lab Results: Recent Labs    06/30/2021 2117 07/09/21 0534 07/10/21 0428  WBC 16.7* 19.2* 15.2*  HGB 11.3* 9.9* 8.2*  HCT 33.9* 30.5* 25.4*  PLT 239 198 161   BMET Recent Labs    06/24/2021 2116 07/09/21 0534 07/09/21 2149 07/10/21 0428  NA 139 139  --  139  K 2.9* 2.9* 2.9* 3.9  CL 109 110  --  111  CO2 17* 17*  --  18*  GLUCOSE 143* 137*  --  111*  BUN 18 12  --  10  CREATININE 1.14* 0.91  --  0.98  CALCIUM 9.2 8.4*  --  8.1*   LFT Recent Labs    07/10/21 0428  PROT 6.7  ALBUMIN 2.5*  AST 70*  ALT 23  ALKPHOS 61  BILITOT 0.6   PT/INR Recent Labs    06/21/2021 2116  LABPROT 15.5*  INR 1.2   Hepatitis Panel No results for input(s): HEPBSAG, HCVAB, HEPAIGM, HEPBIGM in the last 72 hours.  Studies/Results:  Echo results summary eft ventricular ejection fraction, by estimation, is 60 to 65%. Left ventricular ejection fraction by PLAX is 64 %. The left ventricle has normal function. The left ventricle has no regional wall motion abnormalities. There is mild concentric left ventricular hypertrophy. Left ventricular diastolic parameters are consistent with Grade I diastolic dysfunction (impaired relaxation). 1. Right ventricular systolic function is normal. The right ventricular size is normal. There is normal pulmonary artery systolic pressure. 2. The mitral valve is normal in structure. Trivial mitral valve regurgitation. No evidence of mitral stenosis. 3. The aortic valve is normal in structure. Aortic valve regurgitation is trivial. No aortic stenosis is present. 4. The inferior vena cava is dilated in size with >50%  respiratory variability, suggesting right atrial pressure of 8 mmHg.  Assessment;  Patient is 71 year old African-American female who is undergoing therapy for bilateral pneumonia.  She is on Unasyn and BiPAP along with bronchodilators.  She was noted to have heme positive stool on admission and gives history of melena(she is on p.o. iron). However her hemoglobin is dropped by more than 3 g during this admission.  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  EGD in August 2022 for early satiety and weight loss revealed gastritis. Differential diagnosis includes peptic ulcer disease secondary to acute on chronic illness.  She could also have GI angiodysplasia in upper GI tract. Patient is on pantoprazole infusion.   Recommendations;  We will continue monitor H&H and consider transfusion if hemoglobin drops below 8 g. Esophagogastroduodenoscopy when patient deemed to be stable from respiratory standpoint. Further recommendations to follow.   LOS: 2 days   Armella Stogner  07/10/2021, 12:23 PM

## 2021-07-10 DEATH — deceased

## 2021-07-11 ENCOUNTER — Inpatient Hospital Stay (HOSPITAL_COMMUNITY): Payer: Medicare Other

## 2021-07-11 DIAGNOSIS — F419 Anxiety disorder, unspecified: Secondary | ICD-10-CM

## 2021-07-11 LAB — URINALYSIS, COMPLETE (UACMP) WITH MICROSCOPIC
Bilirubin Urine: NEGATIVE
Glucose, UA: NEGATIVE mg/dL
Ketones, ur: NEGATIVE mg/dL
Leukocytes,Ua: NEGATIVE
Nitrite: NEGATIVE
Protein, ur: NEGATIVE mg/dL
Specific Gravity, Urine: 1.015 (ref 1.005–1.030)
pH: 5.5 (ref 5.0–8.0)

## 2021-07-11 LAB — CBC
HCT: 26 % — ABNORMAL LOW (ref 36.0–46.0)
Hemoglobin: 8.7 g/dL — ABNORMAL LOW (ref 12.0–15.0)
MCH: 28.5 pg (ref 26.0–34.0)
MCHC: 33.5 g/dL (ref 30.0–36.0)
MCV: 85.2 fL (ref 80.0–100.0)
Platelets: 188 10*3/uL (ref 150–400)
RBC: 3.05 MIL/uL — ABNORMAL LOW (ref 3.87–5.11)
RDW: 16.6 % — ABNORMAL HIGH (ref 11.5–15.5)
WBC: 11 10*3/uL — ABNORMAL HIGH (ref 4.0–10.5)
nRBC: 0 % (ref 0.0–0.2)

## 2021-07-11 LAB — COMPREHENSIVE METABOLIC PANEL
ALT: 29 U/L (ref 0–44)
AST: 77 U/L — ABNORMAL HIGH (ref 15–41)
Albumin: 2.4 g/dL — ABNORMAL LOW (ref 3.5–5.0)
Alkaline Phosphatase: 68 U/L (ref 38–126)
Anion gap: 11 (ref 5–15)
BUN: 21 mg/dL (ref 8–23)
CO2: 21 mmol/L — ABNORMAL LOW (ref 22–32)
Calcium: 8.2 mg/dL — ABNORMAL LOW (ref 8.9–10.3)
Chloride: 109 mmol/L (ref 98–111)
Creatinine, Ser: 1.1 mg/dL — ABNORMAL HIGH (ref 0.44–1.00)
GFR, Estimated: 55 mL/min — ABNORMAL LOW (ref >60)
Glucose, Bld: 109 mg/dL — ABNORMAL HIGH (ref 70–99)
Potassium: 3.2 mmol/L — ABNORMAL LOW (ref 3.5–5.1)
Sodium: 141 mmol/L (ref 135–145)
Total Bilirubin: 0.6 mg/dL (ref 0.3–1.2)
Total Protein: 7 g/dL (ref 6.5–8.1)

## 2021-07-11 LAB — BLOOD GAS, ARTERIAL
Acid-base deficit: 3.2 mmol/L — ABNORMAL HIGH (ref 0.0–2.0)
Bicarbonate: 21.2 mmol/L (ref 20.0–28.0)
Drawn by: 44443
FIO2: 60
O2 Saturation: 81 %
Patient temperature: 37.1
pCO2 arterial: 43.1 mmHg (ref 32.0–48.0)
pH, Arterial: 7.325 — ABNORMAL LOW (ref 7.350–7.450)
pO2, Arterial: 52.9 mmHg — ABNORMAL LOW (ref 83.0–108.0)

## 2021-07-11 LAB — LEGIONELLA PNEUMOPHILA SEROGP 1 UR AG: L. pneumophila Serogp 1 Ur Ag: NEGATIVE

## 2021-07-11 LAB — BRAIN NATRIURETIC PEPTIDE: B Natriuretic Peptide: 120 pg/mL — ABNORMAL HIGH (ref 0.0–100.0)

## 2021-07-11 LAB — PROCALCITONIN: Procalcitonin: 2.62 ng/mL

## 2021-07-11 LAB — MAGNESIUM: Magnesium: 1.8 mg/dL (ref 1.7–2.4)

## 2021-07-11 MED ORDER — FUROSEMIDE 10 MG/ML IJ SOLN
40.0000 mg | Freq: Once | INTRAMUSCULAR | Status: AC
  Administered 2021-07-11: 40 mg via INTRAVENOUS
  Filled 2021-07-11: qty 4

## 2021-07-11 MED ORDER — LEVALBUTEROL HCL 0.63 MG/3ML IN NEBU
0.6300 mg | INHALATION_SOLUTION | Freq: Four times a day (QID) | RESPIRATORY_TRACT | Status: DC | PRN
  Administered 2021-07-11 (×2): 0.63 mg via RESPIRATORY_TRACT
  Filled 2021-07-11 (×2): qty 3

## 2021-07-11 MED ORDER — LEVALBUTEROL HCL 0.63 MG/3ML IN NEBU
INHALATION_SOLUTION | RESPIRATORY_TRACT | Status: AC
  Filled 2021-07-11: qty 3

## 2021-07-11 MED ORDER — MAGNESIUM SULFATE 2 GM/50ML IV SOLN
2.0000 g | Freq: Once | INTRAVENOUS | Status: AC
  Administered 2021-07-12: 2 g via INTRAVENOUS
  Filled 2021-07-11: qty 50

## 2021-07-11 MED ORDER — POTASSIUM CHLORIDE CRYS ER 20 MEQ PO TBCR
20.0000 meq | EXTENDED_RELEASE_TABLET | Freq: Once | ORAL | Status: AC
  Administered 2021-07-11: 20 meq via ORAL
  Filled 2021-07-11: qty 1

## 2021-07-11 MED ORDER — MORPHINE SULFATE (PF) 2 MG/ML IV SOLN
1.0000 mg | Freq: Once | INTRAVENOUS | Status: AC
  Administered 2021-07-11: 1 mg via INTRAVENOUS

## 2021-07-11 MED ORDER — PANTOPRAZOLE SODIUM 40 MG IV SOLR
INTRAVENOUS | Status: AC
  Filled 2021-07-11: qty 80

## 2021-07-11 NOTE — Progress Notes (Signed)
Patient was complaining about [place] HFNC saying it was irritating her nose.  Patient sat at time was 98%.  Tried turning down the flow and had a little trouble with flowmeter but she started to desat when I got down around 7L.  Started working my way back up.  Patient does not have much reserve, ended up putting her back on 15L.  Sat is currently at 93% .

## 2021-07-11 NOTE — Progress Notes (Signed)
Subjective:  Patient denies heartburn chest pain or abdominal pain.  No melena reported according to nursing staff.  Patient states her breathing is not any better.  Current Medications:  Current Facility-Administered Medications:    0.9 %  sodium chloride infusion, , Intravenous, PRN, Adefeso, Oladapo, DO, Last Rate: 10 mL/hr at 07/09/21 0101, New Bag at 07/09/21 0101   acetaminophen (TYLENOL) tablet 1,000 mg, 1,000 mg, Oral, Q6H, Tat, Shanon Brow, MD, 1,000 mg at 07/11/21 0936   atorvastatin (LIPITOR) tablet 40 mg, 40 mg, Oral, Daily, Adefeso, Oladapo, DO, 40 mg at 07/11/21 0936   butalbital-acetaminophen-caffeine (FIORICET) 50-325-40 MG per tablet 1 tablet, 1 tablet, Oral, BID PRN, Adefeso, Oladapo, DO   Chlorhexidine Gluconate Cloth 2 % PADS 6 each, 6 each, Topical, Q0600, Adefeso, Oladapo, DO, 6 each at 07/11/21 0932   dexmedetomidine (PRECEDEX) 400 MCG/100ML (4 mcg/mL) infusion, 0.1-1 mcg/kg/hr, Intravenous, Titrated, Adefeso, Oladapo, DO, Last Rate: 15.15 mL/hr at 07/11/21 0331, 1 mcg/kg/hr at 07/11/21 0331   doxycycline (VIBRAMYCIN) 100 mg in sodium chloride 0.9 % 250 mL IVPB, 100 mg, Intravenous, Q12H, Adefeso, Oladapo, DO, Last Rate: 125 mL/hr at 07/11/21 0932, 100 mg at 07/11/21 0932   HYDROcodone bit-homatropine (HYCODAN) 5-1.5 MG/5ML syrup 5 mL, 5 mL, Oral, Q4H PRN, Tat, David, MD, 5 mL at 07/11/21 0329   ipratropium (ATROVENT) nebulizer solution 0.5 mg, 0.5 mg, Nebulization, Q6H PRN, Adefeso, Oladapo, DO, 0.5 mg at 07/11/21 0743   levalbuterol (XOPENEX) nebulizer solution 0.63 mg, 0.63 mg, Nebulization, Q6H PRN, Tat, Shanon Brow, MD, 0.63 mg at 07/11/21 0743   morphine 2 MG/ML injection 1 mg, 1 mg, Intravenous, Q4H PRN, Tat, Shanon Brow, MD, 1 mg at 07/11/21 0925   [START ON 07/12/2021] pantoprazole (PROTONIX) injection 40 mg, 40 mg, Intravenous, Q12H, Adefeso, Oladapo, DO   pantoprozole (PROTONIX) 80 mg /NS 100 mL infusion, 8 mg/hr, Intravenous, Continuous, Adefeso, Oladapo, DO, Last Rate: 10 mL/hr  at 07/11/21 0524, 8 mg/hr at 07/11/21 0524   piperacillin-tazobactam (ZOSYN) IVPB 3.375 g, 3.375 g, Intravenous, Q8H, Tat, Shanon Brow, MD, Last Rate: 12.5 mL/hr at 07/11/21 0500, 3.375 g at 07/11/21 0500   prochlorperazine (COMPAZINE) injection 10 mg, 10 mg, Intravenous, Q6H PRN, Adefeso, Oladapo, DO   Objective: Blood pressure (!) 108/54, pulse 89, temperature (!) 101.5 F (38.6 C), temperature source Axillary, resp. rate (!) 28, height '5\' 3"'  (1.6 m), weight 60.5 kg, SpO2 97 %. Patient remains on BiPAP. She is alert and responds to questions. Abdomen is soft and nontender with organomegaly or masses.   Labs/studies Results:   CBC Latest Ref Rng & Units 07/11/2021 07/10/2021 07/09/2021  WBC 4.0 - 10.5 K/uL 11.0(H) 15.2(H) 19.2(H)  Hemoglobin 12.0 - 15.0 g/dL 8.7(L) 8.2(L) 9.9(L)  Hematocrit 36.0 - 46.0 % 26.0(L) 25.4(L) 30.5(L)  Platelets 150 - 400 K/uL 188 161 198    CMP Latest Ref Rng & Units 07/11/2021 07/10/2021 07/09/2021  Glucose 70 - 99 mg/dL 109(H) 111(H) -  BUN 8 - 23 mg/dL 21 10 -  Creatinine 0.44 - 1.00 mg/dL 1.10(H) 0.98 -  Sodium 135 - 145 mmol/L 141 139 -  Potassium 3.5 - 5.1 mmol/L 3.2(L) 3.9 2.9(L)  Chloride 98 - 111 mmol/L 109 111 -  CO2 22 - 32 mmol/L 21(L) 18(L) -  Calcium 8.9 - 10.3 mg/dL 8.2(L) 8.1(L) -  Total Protein 6.5 - 8.1 g/dL 7.0 6.7 -  Total Bilirubin 0.3 - 1.2 mg/dL 0.6 0.6 -  Alkaline Phos 38 - 126 U/L 68 61 -  AST 15 - 41 U/L 77(H) 70(H) -  ALT 0 - 44 U/L 29 23 -    Hepatic Function Latest Ref Rng & Units 07/11/2021 07/10/2021 07/09/2021  Total Protein 6.5 - 8.1 g/dL 7.0 6.7 7.4  Albumin 3.5 - 5.0 g/dL 2.4(L) 2.5(L) 2.8(L)  AST 15 - 41 U/L 77(H) 70(H) 63(H)  ALT 0 - 44 U/L '29 23 27  ' Alk Phosphatase 38 - 126 U/L 68 61 57  Total Bilirubin 0.3 - 1.2 mg/dL 0.6 0.6 0.5  Bilirubin, Direct 0.0 - 0.2 mg/dL - - -    Lab Results  Component Value Date   CRP 32.7 (H) 01/11/2013      Assessment:  #1.  Gastrointestinal bleed.  Patient reported melena for few  days prior to admission.  Stool guaiac positive on admission.  Hemoglobin has been drifting down but seem to have leveled off.  She has not required any blood transfusion.  She is high risk for peptic ulcer disease.  She remains on pantoprazole infusion at a dose of 8 mg/h.  Patient likely does not need urgent endoscopy.  Would consider 1 prior to discharge when she is stable from respiratory standpoint.  However if she ends up being intubated would consider EGD before she is extubated.  #2.  Anemia secondary to GI bleed.  #3.  Respiratory failure secondary to pneumonia in a patient with underlying COPD.  She is on Zosyn and doxycycline.  Recommendations  Will continue monitor H&H. Continue pantoprazole infusion at current dose. Patient will be reevaluated on 07/12/2021.

## 2021-07-11 NOTE — TOC Progression Note (Signed)
Transition of Care [facility]) - Progression Note    Patient Details  Name: JANELLI WELLING MRN: [id number] Date of Birth: Oct 25, 1950  Transition of [facility]) CM/SW Contact  Salome Arnt, [place] Phone Number: 07/11/2021, 10:19 AM  Clinical Narrative:    Transition of Care [facility]) Screening Note   Patient Details  Name: MAI LONGNECKER Date of Birth: [birth year]   Transition of Care [facility]) CM/SW Contact:    Salome Arnt, [place] Phone Number: 07/11/2021, 10:19 AM    Transition of Care Department [facility]) has reviewed patient and no TOC needs have been identified at this time. We will continue to monitor patient advancement through interdisciplinary progression rounds. If new patient transition needs arise, please place a TOC consult.   TOC received home health/DME consult. Pt not stable for PT yet. TOC will follow up after recommendations are made.       Barriers to Discharge: Continued Medical Work up  Expected Discharge Plan and Services                                                 Social Determinants of Health (SDOH) Interventions    Readmission Risk Interventions No flowsheet data found.

## 2021-07-11 NOTE — Progress Notes (Addendum)
RN called this RT to let me know that patient had a bad coughing spell and sats dropped into 60s, patient became anxious and she had to be placed back on Bipap.  RN stated patient was at 100% FIO2 with sat of 97%.  Asked RN to place patient down to 60%.  Patient is currently on 65% with a sat of 95%.  BS are clear and diminished but patient is having trouble slowing respirations down.  Have had to keep reminding patient to take deeper breaths to slow her rate down.  Will continue to monitor.

## 2021-07-11 NOTE — Progress Notes (Signed)
**Note De-Identified Jazleen Robeck Obfuscation** Patient removed from BIPAP and placed on 15L HFNC; tolerating well, VS WNL.  RRT to continue to monitor.

## 2021-07-11 NOTE — Progress Notes (Signed)
PROGRESS NOTE  Cynthia [name] ION:[id number] DOB: [birth year] DOA: 06/22/2021 PCP: Celene Squibb, MD  Brief History:  71 year old female with a history of tobacco abuse, COPD, hypertension, hyperlipidemia, depression presenting with 2-day history of shortness of breath with associated nausea, vomiting, and diarrhea.  The patient also has subjective fevers and chills at home.  History is limited due to the patient's extremis.  Upon EMS arrival, she was noted to have oxygen saturation in the 80% range on room air.  Initial work-up showed a fever of 102.4 F, but she was hemodynamically stable.  She was placed on high flow nasal cannula up to 7 L.  WBC 16.7, hemoglobin 1.3, platelets 239,000.  Sodium 139, potassium 2.9, bicarbonate 17, BUN 12, creatinine 0.91.  The patient was initially started on ceftriaxone and azithromycin.  Apparently, the patient has noted some melanotic stool.  FOBT was positive.   Assessment/Plan: Sepsis -Present on admission -Secondary to pneumonia -Presented with fever, leukocytosis, and respiratory failure -Lactic acid peaked 1.7 -PCT 0.44>>2.62 -follow blood culture--neg to date -UA--no pyuria -still having fevers up to 101.5 but lower than previous -repeat blood cultures   Lobar pneumonia -CTA chest negative for PE, pulmonary artery with mild enlargement; bilateral GGO -Suspect component of aspiration pneumonitis given her recurrent vomiting -Continue Zosyn -MRSA screen--neg -continue doxy   Acute respiratory failure with hypoxia -pt requiring BiPAP>>weaned to 15L HF -wean as tolerated for saturation >90% -due to pneumonia in setting of underlying COPD -COVID neg -12/31 evening--resp distress>>CCM started low dose precedex -1/1--personally reviewed CXR>>pulmonary edema -1/1 and 1/2-->lasix IV x 1 -1/1 Echo EF 60-65%, no WMA, G1DD -1/2--personally reviewed CXR--increased interstitial markings   Essential hypertension -Holding  antihypertensive agents at this time and monitor clinically   Hypokalemia/Hypomagnesemia -Repleted   Diarrhea -cdiff neg -GI pathogen panel--neg   Hyperlipidemia -continue statin   FOBT positive -not stable presently for endoscopic eval -continue protonix -SCDs   Chronic Pain syndrome -she is on buprenorphine patch 7.5 mcg/hr and lyrica             Family Communication:  no  Family at bedside   Consultants:  none   Code Status:  FULL    DVT Prophylaxis:  SCDs     Procedures: As Listed in Progress Note Above   Antibiotics: Zosyn 12/31>> Doxy 12/31>>        Subjective: Patient states she is breathing better, but remains sob.  Denies cp, n/v/d, abd pain.  Denies headache.  Objective: Vitals:   07/11/21 1100 07/11/21 1200 07/11/21 1216 07/11/21 1300  BP: (!) 114/49 (!) 111/58    Pulse: 79 74 75   Resp: (!) 21 20 20    Temp:  99.3 F (37.4 C)  99.3 F (37.4 C)  TempSrc:  Axillary  Axillary  SpO2: 99% 99% 95%   Weight:      Height:        Intake/Output Summary (Last 24 hours) at 07/11/2021 1339 Last data filed at 07/11/2021 1300 Gross per 24 hour  Intake 0 ml  Output 1050 ml  Net -1050 ml   Weight change:  Exam:  General:  Pt is alert, follows commands appropriately, not in acute distress HEENT: No icterus, No thrush, No neck mass, [place]/AT Cardiovascular: RRR, S1/S2, no rubs, no gallops Respiratory: bilateral crackles.  No wheeze Abdomen: Soft/+BS, non tender, non distended, no guarding Extremities: No edema, No lymphangitis, No petechiae, No rashes, no synovitis   Data Reviewed:  I have personally reviewed following labs and imaging studies Basic Metabolic Panel: Recent Labs  Lab 06/28/2021 2116 06/24/2021 2117 07/09/21 0534 07/09/21 2149 07/10/21 0428 07/11/21 0621  NA 139  --  139  --  139 141  K 2.9*  --  2.9* 2.9* 3.9 3.2*  CL 109  --  110  --  111 109  CO2 17*  --  17*  --  18* 21*  GLUCOSE 143*  --  137*  --  111* 109*  BUN 18  --   12  --  10 21  CREATININE 1.14*  --  0.91  --  0.98 1.10*  CALCIUM 9.2  --  8.4*  --  8.1* 8.2*  MG  --  2.0 1.7 1.5* 2.2 1.8  PHOS  --   --  2.5  --   --   --    Liver Function Tests: Recent Labs  Lab 06/16/2021 2116 07/09/21 0534 07/10/21 0428 07/11/21 0621  AST 64* 63* 70* 77*  ALT 29 27 23 29   ALKPHOS 62 57 61 68  BILITOT 1.0 0.5 0.6 0.6  PROT 8.7* 7.4 6.7 7.0  ALBUMIN 3.3* 2.8* 2.5* 2.4*   No results for input(s): LIPASE, AMYLASE in the last 168 hours. No results for input(s): AMMONIA in the last 168 hours. Coagulation Profile: Recent Labs  Lab 06/24/2021 2116  INR 1.2   CBC: Recent Labs  Lab 06/18/2021 2117 07/09/21 0534 07/10/21 0428 07/11/21 0621  WBC 16.7* 19.2* 15.2* 11.0*  HGB 11.3* 9.9* 8.2* 8.7*  HCT 33.9* 30.5* 25.4* 26.0*  MCV 86.5 85.7 86.4 85.2  PLT 239 198 161 188   Cardiac Enzymes: No results for input(s): CKTOTAL, CKMB, CKMBINDEX, TROPONINI in the last 168 hours. BNP: Invalid input(s): POCBNP CBG: Recent Labs  Lab 06/20/2021 2122 07/10/21 0745  GLUCAP 144* 116*   HbA1C: No results for input(s): HGBA1C in the last 72 hours. Urine analysis:    Component Value Date/Time   COLORURINE YELLOW 07/03/2021 2110   APPEARANCEUR CLEAR 07/05/2021 2110   LABSPEC 1.015 06/11/2021 2110   PHURINE 5.0 07/03/2021 2110   GLUCOSEU NEGATIVE 06/24/2021 2110   HGBUR MODERATE (A) 06/24/2021 2110   HGBUR moderate 11/11/2008 [place] 06/16/2021 2110   KETONESUR 5 (A) 06/18/2021 2110   PROTEINUR 100 (A) 06/23/2021 2110   UROBILINOGEN 0.2 11/11/2008 0921   NITRITE NEGATIVE 06/17/2021 2110   LEUKOCYTESUR NEGATIVE 06/21/2021 2110   Sepsis Labs: @LABRCNTIP (procalcitonin:4,lacticidven:4) ) Recent Results (from the past 240 hour(s))  Urine Culture     Status: None   Collection Time: 06/28/2021  9:16 PM   Specimen: In/Out Cath Urine  Result Value Ref Range Status   Specimen Description   Final    IN/OUT CATH URINE Performed at [facility], [address], [place], [place] [address]    Special Requests   Final    NONE Performed at [facility], [address] [place], [place] [address]    Culture   Final    NO GROWTH Performed at [facility], [place] [address], [place], [place] [address]    Report Status 07/10/2021 FINAL  Final  Blood Culture (routine x 2)     Status: None (Preliminary result)   Collection Time: 06/11/2021  9:19 PM   Specimen: Right Antecubital; Blood  Result Value Ref Range Status   Specimen Description   Final    RIGHT ANTECUBITAL BOTTLES DRAWN AEROBIC AND ANAEROBIC   Special Requests Blood Culture  adequate volume  Final   Culture   Final    NO GROWTH 3 DAYS Performed at [facility], [address], [place], [place] [address]    Report Status PENDING  Incomplete  Blood Culture (routine x 2)     Status: None (Preliminary result)   Collection Time: 06/20/2021  9:19 PM   Specimen: Left Antecubital; Blood  Result Value Ref Range Status   Specimen Description   Final    LEFT ANTECUBITAL BOTTLES DRAWN AEROBIC AND ANAEROBIC   Special Requests Blood Culture adequate volume  Final   Culture   Final    NO GROWTH 3 DAYS Performed at [facility], [address] [place], [place] [address]    Report Status PENDING  Incomplete  Resp Panel by RT-PCR (Flu A&B, Covid) Nasopharyngeal Swab     Status: None   Collection Time: 06/12/2021  9:23 PM   Specimen: Nasopharyngeal Swab; Nasopharyngeal(NP) swabs in vial transport medium  Result Value Ref Range Status   SARS Coronavirus 2 by RT PCR NEGATIVE NEGATIVE Final    Comment: (NOTE) SARS-CoV-2 target nucleic acids are NOT DETECTED.  The SARS-CoV-2 RNA is generally detectable in upper respiratory specimens during the acute phase of infection. The lowest concentration of SARS-CoV-2 viral copies this assay can detect is 138 copies/mL. A negative result does not preclude SARS-Cov-2 infection and should not be used as the sole basis for treatment or other patient  management decisions. A negative result may occur with  improper specimen collection/handling, submission of specimen other than nasopharyngeal swab, presence of viral mutation(s) within the areas targeted by this assay, and inadequate number of viral copies(<138 copies/mL). A negative result must be combined with clinical observations, patient history, and epidemiological information. The expected result is Negative.  Fact Sheet for Patients:  EntrepreneurPulse.com.au  Fact Sheet for Healthcare Providers:  IncredibleEmployment.be  This test is no t yet approved or cleared by the [place] FDA and  has been authorized for detection and/or diagnosis of SARS-CoV-2 by FDA under an Emergency Use Authorization (EUA). This EUA will remain  in effect (meaning this test can be used) for the duration of the COVID-19 declaration under Section 564(b)(1) of the Act, 21 U.S.C.section 360bbb-3(b)(1), unless the authorization is terminated  or revoked sooner.       Influenza A by PCR NEGATIVE NEGATIVE Final   Influenza B by PCR NEGATIVE NEGATIVE Final    Comment: (NOTE) The Xpert Xpress SARS-CoV-2/FLU/RSV plus assay is intended as an aid in the diagnosis of influenza from Nasopharyngeal swab specimens and should not be used as a sole basis for treatment. Nasal washings and aspirates are unacceptable for Xpert Xpress SARS-CoV-2/FLU/RSV testing.  Fact Sheet for Patients: EntrepreneurPulse.com.au  Fact Sheet for Healthcare Providers: IncredibleEmployment.be  This test is not yet approved or cleared by the [place] FDA and has been authorized for detection and/or diagnosis of SARS-CoV-2 by FDA under an Emergency Use Authorization (EUA). This EUA will remain in effect (meaning this test can be used) for the duration of the COVID-19 declaration under Section 564(b)(1) of the Act, 21 U.S.C. section 360bbb-3(b)(1),  unless the authorization is terminated or revoked.  Performed at [facility], [address], [place], [place] [address]   C Difficile Quick Screen w PCR reflex     Status: None   Collection Time: 07/09/21 12:14 AM   Specimen: Stool  Result Value Ref Range Status   C Diff antigen NEGATIVE NEGATIVE Final   C Diff toxin NEGATIVE NEGATIVE  Final   C Diff interpretation No C. difficile detected.  Final    Comment: Performed at [facility], [address] [place], [place] [address]  Gastrointestinal Panel by PCR , Stool     Status: None   Collection Time: 07/09/21 12:14 AM   Specimen: Stool  Result Value Ref Range Status   Campylobacter species NOT DETECTED NOT DETECTED Final   Plesimonas shigelloides NOT DETECTED NOT DETECTED Final   Salmonella species NOT DETECTED NOT DETECTED Final   Yersinia enterocolitica NOT DETECTED NOT DETECTED Final   Vibrio species NOT DETECTED NOT DETECTED Final   Vibrio cholerae NOT DETECTED NOT DETECTED Final   Enteroaggregative E coli (EAEC) NOT DETECTED NOT DETECTED Final   Enteropathogenic E coli (EPEC) NOT DETECTED NOT DETECTED Final   Enterotoxigenic E coli (ETEC) NOT DETECTED NOT DETECTED Final   Shiga like toxin producing E coli (STEC) NOT DETECTED NOT DETECTED Final   Shigella/Enteroinvasive E coli (EIEC) NOT DETECTED NOT DETECTED Final   Cryptosporidium NOT DETECTED NOT DETECTED Final   Cyclospora cayetanensis NOT DETECTED NOT DETECTED Final   Entamoeba histolytica NOT DETECTED NOT DETECTED Final   Giardia lamblia NOT DETECTED NOT DETECTED Final   Adenovirus F40/41 NOT DETECTED NOT DETECTED Final   Astrovirus NOT DETECTED NOT DETECTED Final   Norovirus GI/GII NOT DETECTED NOT DETECTED Final   Rotavirus A NOT DETECTED NOT DETECTED Final   Sapovirus (I, II, IV, and V) NOT DETECTED NOT DETECTED Final    Comment: Performed at [facility], [address] [place], [place] [address]  MRSA Next Gen by PCR, Nasal     Status: None    Collection Time: 07/09/21  3:13 AM   Specimen: Nasal Mucosa; Nasal Swab  Result Value Ref Range Status   MRSA by PCR Next Gen NOT DETECTED NOT DETECTED Final    Comment: (NOTE) The GeneXpert MRSA Assay (FDA approved for NASAL specimens only), is one component of a comprehensive MRSA colonization surveillance program. It is not intended to diagnose MRSA infection nor to guide or monitor treatment for MRSA infections. Test performance is not FDA approved in patients less than 55 years old. Performed at [facility], [address] [place], [place] [address]   Culture, blood (routine x 2)     Status: None (Preliminary result)   Collection Time: 07/10/21  6:02 PM   Specimen: BLOOD RIGHT HAND  Result Value Ref Range Status   Specimen Description   Final    BLOOD RIGHT HAND BOTTLES DRAWN AEROBIC AND ANAEROBIC   Special Requests   Final    Blood Culture results may not be optimal due to an excessive volume of blood received in culture bottles   Culture   Final    NO GROWTH < 24 HOURS Performed at [facility], [address] [place], [place] [address]    Report Status PENDING  Incomplete  Culture, blood (routine x 2)     Status: None (Preliminary result)   Collection Time: 07/10/21  6:02 PM   Specimen: BLOOD LEFT HAND  Result Value Ref Range Status   Specimen Description   Final    BLOOD LEFT HAND BOTTLES DRAWN AEROBIC AND ANAEROBIC   Special Requests   Final    Blood Culture results may not be optimal due to an excessive volume of blood received in culture bottles   Culture   Final    NO GROWTH < 24 HOURS Performed at [facility], [address] [place], [place] [address]  Report Status PENDING  Incomplete     Scheduled Meds:  acetaminophen  1,000 mg Oral Q6H   atorvastatin  40 mg Oral Daily   Chlorhexidine Gluconate Cloth  6 each Topical Q0600   furosemide  40 mg Intravenous Once   [START ON 07/12/2021] pantoprazole  40 mg Intravenous Q12H   Continuous Infusions:  sodium  chloride 10 mL/hr at 07/09/21 0101   dexmedetomidine (PRECEDEX) IV infusion 0.9 mcg/kg/hr (07/11/21 1231)   doxycycline (VIBRAMYCIN) IV 100 mg (07/11/21 0932)   pantoprazole 8 mg/hr (07/11/21 0524)   piperacillin-tazobactam (ZOSYN)  IV Stopped (07/11/21 0900)    Procedures/Studies: CT Angio Chest Pulmonary Embolism (PE) W or WO Contrast  Result Date: 07/09/2021 CLINICAL DATA:  Positive D-dimer with pulmonary embolism suspected EXAM: CT ANGIOGRAPHY CHEST WITH CONTRAST TECHNIQUE: Multidetector CT imaging of the chest was performed using the standard protocol during bolus administration of intravenous contrast. Multiplanar CT image reconstructions and MIPs were obtained to evaluate the vascular anatomy. CONTRAST:  153mL OMNIPAQUE IOHEXOL 350 MG/ML SOLN COMPARISON:  10/22/2018 FINDINGS: Cardiovascular: Satisfactory opacification of the pulmonary arteries to the segmental level. No evidence of pulmonary embolism when accounting for unavoidable motion artifact. Normal heart size. No pericardial effusion. Main pulmonary is enlarged to 4 cm in diameter. Atheromatous calcification of the aorta. Mediastinum/Nodes: No adenopathy or mass.  Benign-appearing thyroid Lungs/Pleura: Ground-glass opacity bilateral lungs which are relatively low volume. No honeycombing noted. No edema, effusion, or pneumothorax. Upper Abdomen: Negative Musculoskeletal: No acute or aggressive finding Review of the MIP images confirms the above findings. IMPRESSION: 1. Bilateral pneumonia. 2. Negative for pulmonary embolism when accounting for motion artifact. Electronically Signed   By: Jorje Guild M.D.   On: 07/09/2021 04:07   DG CHEST PORT 1 VIEW  Result Date: 07/11/2021 CLINICAL DATA:  Acute respiratory failure and hypoxia.  Pneumonia. EXAM: PORTABLE CHEST 1 VIEW COMPARISON:  07/09/2021 FINDINGS: Stable cardiomediastinal contours. Bilateral increase interstitial opacities are identified, left greater than right. The appearance is  unchanged from previous exam. No new findings. IMPRESSION: No change in aeration to the lungs compared with previous exam. Electronically Signed   By: Kerby Moors M.D.   On: 07/11/2021 06:39   DG CHEST PORT 1 VIEW  Result Date: 07/09/2021 CLINICAL DATA:  Worsening SOB. Hx of bronchitis, COPD, HTN, pneumonia, and current smoker of 0.25 packs a day x 42 years. Negative covid, flu, and RSV yesterday. EXAM: PORTABLE CHEST 1 VIEW COMPARISON:  CT angio chest 07/09/2021, chest x-ray 07/03/2021 FINDINGS: The heart and mediastinal contours are unchanged. Aortic calcification. Interval increased interstitial and airspace opacities with prominence within the mid to lower lungs. No pleural effusion. No pneumothorax. No acute osseous abnormality. IMPRESSION: Interval increase in marked interstitial and airspace opacities with prominence within the mid to lower lungs. Findings consistent with pulmonary edema. Electronically Signed   By: Iven Finn M.D.   On: 07/09/2021 22:13   DG Chest Portable 1 View  Result Date: 06/26/2021 CLINICAL DATA:  Shortness of breath.  Sepsis workup. EXAM: PORTABLE CHEST 1 VIEW COMPARISON:  Chest CT with contrast and portable chest both 10/22/2018. FINDINGS: There is mild cardiomegaly, normal caliber central vessels. There is a low inspiration. Interstitial and patchy hazy opacities in the left mid to lower lung field are noted most likely due to pneumonia. The remaining hypoinflated lungs are clear. No pleural effusion is seen. There is calcification of the transverse aorta. Thoracic spondylosis. IMPRESSION: Interstitial and patchy hazy opacities of the left mid to lower lung field,  most likely due to pneumonia. This could all be in the upper lobe or could be a combination of upper and lower lobe disease. Clinical correlation and radiographic follow-up recommended. Limited view of the lung bases due to low inspiration Electronically Signed   By: Telford Nab M.D.   On: 07/02/2021  21:50   ECHOCARDIOGRAM COMPLETE  Result Date: 07/10/2021    ECHOCARDIOGRAM REPORT   Patient Name:   Cynthia [name] Date of Exam: 07/10/2021 Medical Rec #:  [id number]         Height:       63.0 in Accession #:    [id number]        Weight:       133.6 lb Date of Birth:  08-Sep-1950         BSA:          1.629 m Patient Age:    37 years          BP:           123/55 mmHg Patient Gender: F                 HR:           77 bpm. Exam Location:  [facility] Procedure: 2D Echo, Cardiac Doppler and Color Doppler Indications:    CHF  History:        Patient has prior history of Echocardiogram examinations, most                 recent 06/08/2017. COPD; Risk Factors:Hypertension and                 Dyslipidemia. Tobacco abuse.  Sonographer:    Wenda Low Referring Phys: [phone] Brylynn Hanssen IMPRESSIONS  1. Left ventricular ejection fraction, by estimation, is 60 to 65%. Left ventricular ejection fraction by PLAX is 64 %. The left ventricle has normal function. The left ventricle has no regional wall motion abnormalities. There is mild concentric left ventricular hypertrophy. Left ventricular diastolic parameters are consistent with Grade I diastolic dysfunction (impaired relaxation).  2. Right ventricular systolic function is normal. The right ventricular size is normal. There is normal pulmonary artery systolic pressure.  3. The mitral valve is normal in structure. Trivial mitral valve regurgitation. No evidence of mitral stenosis.  4. The aortic valve is normal in structure. Aortic valve regurgitation is trivial. No aortic stenosis is present.  5. The inferior vena cava is dilated in size with >50% respiratory variability, suggesting right atrial pressure of 8 mmHg. FINDINGS  Left Ventricle: Left ventricular ejection fraction, by estimation, is 60 to 65%. Left ventricular ejection fraction by PLAX is 64 %. The left ventricle has normal function. The left ventricle has no regional wall motion abnormalities. The left  ventricular internal cavity size was normal in size. There is mild concentric left ventricular hypertrophy. Left ventricular diastolic parameters are consistent with Grade I diastolic dysfunction (impaired relaxation). Right Ventricle: The right ventricular size is normal. No increase in right ventricular wall thickness. Right ventricular systolic function is normal. There is normal pulmonary artery systolic pressure. The tricuspid regurgitant velocity is 2.44 m/s, and  with an assumed right atrial pressure of 8 mmHg, the estimated right ventricular systolic pressure is 67.3 mmHg. Left Atrium: Left atrial size was normal in size. Right Atrium: Right atrial size was normal in size. Pericardium: There is no evidence of pericardial effusion. Mitral Valve: The mitral valve is normal in structure. Trivial mitral valve regurgitation. No evidence  of mitral valve stenosis. MV peak gradient, 6.2 mmHg. The mean mitral valve gradient is 2.0 mmHg. Tricuspid Valve: The tricuspid valve is normal in structure. Tricuspid valve regurgitation is trivial. No evidence of tricuspid stenosis. Aortic Valve: The aortic valve is normal in structure. Aortic valve regurgitation is trivial. No aortic stenosis is present. Aortic valve mean gradient measures 5.0 mmHg. Aortic valve peak gradient measures 9.5 mmHg. Aortic valve area, by VTI measures 2.16 cm. Pulmonic Valve: The pulmonic valve was normal in structure. Pulmonic valve regurgitation is not visualized. No evidence of pulmonic stenosis. Aorta: The aortic root is normal in size and structure. Venous: The inferior vena cava is dilated in size with greater than 50% respiratory variability, suggesting right atrial pressure of 8 mmHg. IAS/Shunts: No atrial level shunt detected by color flow Doppler.  LEFT VENTRICLE PLAX 2D LV EF:         Left            Diastology                ventricular     LV e' medial:  6.31 cm/s                ejection        LV e' lateral: 8.16 cm/s                 fraction by                PLAX is 64                %. LVIDd:         4.30 cm LVIDs:         2.80 cm LV PW:         1.20 cm LV IVS:        1.70 cm LVOT diam:     2.00 cm LV SV:         77 LV SV Index:   47 LVOT Area:     3.14 cm  LV Volumes (MOD) LV vol d, MOD    39.6 ml A2C: LV vol d, MOD    42.4 ml A4C: LV vol s, MOD    12.1 ml A2C: LV vol s, MOD    13.7 ml A4C: LV SV MOD A2C:   27.5 ml LV SV MOD A4C:   42.4 ml LV SV MOD BP:    28.3 ml RIGHT VENTRICLE RV Basal diam:  3.50 cm RV Mid diam:    2.70 cm RV S prime:     12.90 cm/s TAPSE (M-mode): 2.4 cm LEFT ATRIUM             Index        RIGHT ATRIUM          Index LA diam:        3.40 cm 2.09 cm/m   RA Area:     9.87 cm LA Vol (A2C):   25.5 ml 15.65 ml/m  RA Volume:   17.20 ml 10.56 ml/m LA Vol (A4C):   23.9 ml 14.67 ml/m LA Biplane Vol: 27.3 ml 16.76 ml/m  AORTIC VALVE                    PULMONIC VALVE AV Area (Vmax):    2.28 cm     PV Vmax:       0.89 m/s AV Area (Vmean):   2.15 cm     PV Peak grad:  3.1 mmHg AV Area (VTI):     2.16 cm AV Vmax:           154.00 cm/s AV Vmean:          97.500 cm/s AV VTI:            0.357 m AV Peak Grad:      9.5 mmHg AV Mean Grad:      5.0 mmHg LVOT Vmax:         112.00 cm/s LVOT Vmean:        66.600 cm/s LVOT VTI:          0.245 m LVOT/AV VTI ratio: 0.69  AORTA Ao Root diam: 3.20 cm Ao Asc diam:  2.60 cm MITRAL VALVE              TRICUSPID VALVE MV Area (PHT): 3.24 cm   TR Peak grad:   23.8 mmHg MV Area VTI:   2.16 cm   TR Vmax:        244.00 cm/s MV Peak grad:  6.2 mmHg MV Mean grad:  2.0 mmHg   SHUNTS MV Vmax:       1.24 m/s   Systemic VTI:  0.24 m MV Vmean:      69.4 cm/s  Systemic Diam: 2.00 cm Skeet Latch MD Electronically signed by Skeet Latch MD Signature Date/Time: 07/10/2021/11:53:33 AM    Final     Orson Eva, DO  [facility]  If 7PM-7AM, please contact night-coverage [email or web address] Password [email or web address] 07/11/2021, 1:39 PM   LOS: 3 days

## 2021-07-11 NOTE — Progress Notes (Addendum)
CRITICAL CARE NOTE  RN called due to patient being more confused and struggling to of breath.  She was initially off BiPAP, but this was restarted.  ABG was ordered. At bedside, patient appeared anxious.  Precedex was increased from 1-1.2 at this time, morphine was given.  Magnesium was optimized.  Chest x-ray done earlier today was reviewed and showed bilateral increase interstitial opacities are identified, left greater than right. The appearance is unchanged from previous exam (12/31). No new findings.  Physical Exam  BP (!) 166/70    Pulse (!) 103    Temp 98.8 F (37.1 C) (Axillary)    Resp (!) 34    Ht 5\' 3"  (1.6 m)    Wt 60.5 kg    SpO2 97%    BMI 23.63 kg/m   Gen:- Awake Alert, and oriented x3 but was in acute distress HEENT:- [place].AT, No sclera icterus Neck-Supple Neck,No JVD,.  Lungs-  Diffuse rhonchi and scattered wheezes on auscultation  CV- S1, S2 normal Abd-  +ve B.Sounds, Abd Soft, No tenderness,    Extremity/Skin:- No  edema,    Psych-anxious Neuro-no new focal deficits, no tremors  Assessment and plan Acute respiratory failure with hypoxia due to lobar pneumonia Breathing treatment was provided Continue management per DKA physician ABG: pH 7.325/43.1/56.9/21.2 at FiO2 of 60% PCCM consult was placed and we shall await further recommendation  Anxiety in the setting of above Continue Precedex Continue morphine as needed  Magnesium level was 1.8, magnesium was given to reach a goal of 2.0  About 45 minutes to 1 hour after patient was initially stabilized, she became agitated and distressed again, she was tachypneic with HR in the 30s. [place] physician (Dr. Genevive Bi) was consulted and recommended increasing the EPAP to 10 (IPAP/EPAP 14/10), morphine was increased to 2 mg every 2 hours as needed for RR > 30/minute, Lasix was indicated and potassium was replenished.  Critical time: 49 minutes   Critical care personally provided  managing the patient due to high probability  of clinically significant and life threatening deterioration. This critical care time included obtaining a history; examining the patient, pulse oximetry; ordering and review of studies; arranging urgent treatment with development of a management plan; evaluation of patient's response of treatment; frequent reassessment; and discussions with other providers.  This critical care time was performed to assess and manage the high probability of imminent and life threatening deterioration that could result in multi-organ failure.

## 2021-07-12 ENCOUNTER — Inpatient Hospital Stay (HOSPITAL_COMMUNITY): Payer: Medicare Other

## 2021-07-12 ENCOUNTER — Encounter (HOSPITAL_COMMUNITY): Payer: Self-pay | Admitting: Internal Medicine

## 2021-07-12 DIAGNOSIS — R918 Other nonspecific abnormal finding of lung field: Secondary | ICD-10-CM

## 2021-07-12 DIAGNOSIS — J449 Chronic obstructive pulmonary disease, unspecified: Secondary | ICD-10-CM

## 2021-07-12 DIAGNOSIS — T17908D Unspecified foreign body in respiratory tract, part unspecified causing other injury, subsequent encounter: Secondary | ICD-10-CM

## 2021-07-12 DIAGNOSIS — K921 Melena: Secondary | ICD-10-CM

## 2021-07-12 DIAGNOSIS — J9601 Acute respiratory failure with hypoxia: Secondary | ICD-10-CM

## 2021-07-12 DIAGNOSIS — T17908A Unspecified foreign body in respiratory tract, part unspecified causing other injury, initial encounter: Secondary | ICD-10-CM | POA: Diagnosis present

## 2021-07-12 LAB — BLOOD GAS, VENOUS
Acid-base deficit: 1 mmol/L (ref 0.0–2.0)
Bicarbonate: 23.2 mmol/L (ref 20.0–28.0)
Drawn by: 4252
FIO2: 80
O2 Saturation: 85.3 %
Patient temperature: 36.4
pCO2, Ven: 39.8 mmHg — ABNORMAL LOW (ref 44.0–60.0)
pH, Ven: 7.384 (ref 7.250–7.430)
pO2, Ven: 51.3 mmHg — ABNORMAL HIGH (ref 32.0–45.0)

## 2021-07-12 LAB — BASIC METABOLIC PANEL
Anion gap: 11 (ref 5–15)
BUN: 24 mg/dL — ABNORMAL HIGH (ref 8–23)
CO2: 25 mmol/L (ref 22–32)
Calcium: 8.3 mg/dL — ABNORMAL LOW (ref 8.9–10.3)
Chloride: 108 mmol/L (ref 98–111)
Creatinine, Ser: 1.25 mg/dL — ABNORMAL HIGH (ref 0.44–1.00)
GFR, Estimated: 47 mL/min — ABNORMAL LOW (ref >60)
Glucose, Bld: 88 mg/dL (ref 70–99)
Potassium: 3.4 mmol/L — ABNORMAL LOW (ref 3.5–5.1)
Sodium: 144 mmol/L (ref 135–145)

## 2021-07-12 LAB — CBC
HCT: 27.1 % — ABNORMAL LOW (ref 36.0–46.0)
Hemoglobin: 8.8 g/dL — ABNORMAL LOW (ref 12.0–15.0)
MCH: 27.3 pg (ref 26.0–34.0)
MCHC: 32.5 g/dL (ref 30.0–36.0)
MCV: 84.2 fL (ref 80.0–100.0)
Platelets: 198 10*3/uL (ref 150–400)
RBC: 3.22 MIL/uL — ABNORMAL LOW (ref 3.87–5.11)
RDW: 16 % — ABNORMAL HIGH (ref 11.5–15.5)
WBC: 11.1 10*3/uL — ABNORMAL HIGH (ref 4.0–10.5)
nRBC: 0 % (ref 0.0–0.2)

## 2021-07-12 LAB — MAGNESIUM: Magnesium: 2.3 mg/dL (ref 1.7–2.4)

## 2021-07-12 LAB — SEDIMENTATION RATE: Sed Rate: 135 mm/hr — ABNORMAL HIGH (ref 0–22)

## 2021-07-12 MED ORDER — ARFORMOTEROL TARTRATE 15 MCG/2ML IN NEBU
15.0000 ug | INHALATION_SOLUTION | Freq: Two times a day (BID) | RESPIRATORY_TRACT | Status: DC
  Administered 2021-07-12 – 2021-07-19 (×13): 15 ug via RESPIRATORY_TRACT
  Filled 2021-07-12 (×15): qty 2

## 2021-07-12 MED ORDER — BUDESONIDE 0.5 MG/2ML IN SUSP
0.5000 mg | Freq: Two times a day (BID) | RESPIRATORY_TRACT | Status: DC
  Administered 2021-07-12 – 2021-07-19 (×14): 0.5 mg via RESPIRATORY_TRACT
  Filled 2021-07-12 (×14): qty 2

## 2021-07-12 MED ORDER — ALBUTEROL SULFATE (2.5 MG/3ML) 0.083% IN NEBU: 2.5000 mg | INHALATION_SOLUTION | RESPIRATORY_TRACT | Status: DC | PRN

## 2021-07-12 MED ORDER — MORPHINE SULFATE (PF) 2 MG/ML IV SOLN
2.0000 mg | INTRAVENOUS | Status: DC | PRN
Start: 2021-07-12 — End: 2021-07-13
  Administered 2021-07-12 – 2021-07-13 (×7): 2 mg via INTRAVENOUS
  Filled 2021-07-12 (×7): qty 1

## 2021-07-12 MED ORDER — REVEFENACIN 175 MCG/3ML IN SOLN
175.0000 ug | Freq: Every day | RESPIRATORY_TRACT | Status: DC
  Administered 2021-07-13 – 2021-07-17 (×5): 175 ug via RESPIRATORY_TRACT
  Filled 2021-07-12 (×6): qty 3

## 2021-07-12 MED ORDER — PANTOPRAZOLE SODIUM 40 MG IV SOLR
INTRAVENOUS | Status: AC
  Filled 2021-07-12: qty 80

## 2021-07-12 MED ORDER — PANTOPRAZOLE INFUSION (NEW) - SIMPLE MED
8.0000 mg/h | INTRAVENOUS | Status: DC
  Administered 2021-07-12: 8 mg/h via INTRAVENOUS
  Filled 2021-07-12 (×4): qty 100

## 2021-07-12 MED ORDER — MORPHINE SULFATE (PF) 2 MG/ML IV SOLN
1.0000 mg | INTRAVENOUS | Status: DC | PRN
Start: 2021-07-12 — End: 2021-07-12

## 2021-07-12 MED ORDER — CLOPIDOGREL BISULFATE 75 MG PO TABS
75.0000 mg | ORAL_TABLET | Freq: Every day | ORAL | Status: DC
  Administered 2021-07-12: 75 mg via ORAL
  Filled 2021-07-12: qty 1

## 2021-07-12 MED ORDER — PANTOPRAZOLE 80MG IVPB - SIMPLE MED
80.0000 mg | Freq: Once | INTRAVENOUS | Status: DC
  Filled 2021-07-12: qty 100

## 2021-07-12 MED ORDER — ASPIRIN 81 MG PO CHEW
81.0000 mg | CHEWABLE_TABLET | Freq: Every day | ORAL | Status: DC
  Administered 2021-07-13 – 2021-07-15 (×3): 81 mg
  Filled 2021-07-12 (×4): qty 1

## 2021-07-12 MED ORDER — FUROSEMIDE 10 MG/ML IJ SOLN
40.0000 mg | Freq: Once | INTRAMUSCULAR | Status: AC
  Administered 2021-07-12: 40 mg via INTRAVENOUS
  Filled 2021-07-12: qty 4

## 2021-07-12 MED ORDER — POTASSIUM CHLORIDE 10 MEQ/100ML IV SOLN
10.0000 meq | INTRAVENOUS | Status: AC
  Administered 2021-07-12 (×2): 10 meq via INTRAVENOUS
  Filled 2021-07-12 (×2): qty 100

## 2021-07-12 MED ORDER — ASPIRIN EC 81 MG PO TBEC: 81.0000 mg | DELAYED_RELEASE_TABLET | Freq: Every day | ORAL | Status: DC

## 2021-07-12 MED ORDER — METHYLPREDNISOLONE SODIUM SUCC 40 MG IJ SOLR
40.0000 mg | Freq: Four times a day (QID) | INTRAMUSCULAR | Status: DC
  Administered 2021-07-12 – 2021-07-14 (×8): 40 mg via INTRAVENOUS
  Filled 2021-07-12 (×8): qty 1

## 2021-07-12 NOTE — Progress Notes (Signed)
Subjective: Patient seems altered, unable to get much history from her. Trouble just telling me her name. Very slow to respond. Unclear if this is due to shortness of breath or other etiology. When asked what is bothering her the most, I do not get an understandable response. Asked if her breathing is bothering her, she shook her head yes. Denies chest pain. Right side of her mouth seems a little droopy. When asked to smile, no real response from right side. She is able to squeeze my hands, but right side was weaker than left. Shakes her head no to abdominal pain or vomiting. Unable to give any other significant information today.   Spoke with nurse Purcell Nails who states patient is a completely different person than she was on Sunday. Sunday she was much more conversant. [place] reports she had a BM that was green/brown. No overt GI bleeding. No vomiting. Hasn't really been able to eat since admission due to struggling to breathe. Currently NPO.   Appears she had some confusion and increased respiratory distress overnight and was placed back on bipap. She is receiving Precedex and morphine.   Stating 100% on 15L [place] while I was in the room. BP stable/normotensive. HR in the 80s.   Objective: Vital signs in last 24 hours: Temp:  [98.3 F (36.8 C)-99.3 F (37.4 C)] 98.3 F (36.8 C) (01/03 0800) Pulse Rate:  [56-103] 98 (01/03 0500) Resp:  [17-46] 26 (01/03 1100) BP: (100-170)/(37-108) 128/55 (01/03 1100) SpO2:  [90 %-100 %] 96 % (01/03 1032) FiO2 (%):  [65 %] 65 % (01/02 2103) Weight:  [59.7 kg] 59.7 kg (01/03 0540) Last BM Date: 07/10/21 General:   Alert, eyes are open, but  seems altered with little ability to give information, ill appearing, on 15 L [place], breathing through her mouth which appears to have slight droop on the right side.  Head:  Normocephalic and atraumatic. Eyes:  No icterus, sclera clear. Conjuctiva pink. Doesn't follow my index finger with her eyes.  Mouth:  Appears to  have slight right sided droop. Asymmetric smile. Requested patient to open her mouth and stick out her tongue, but didn't follow this request.  Heart:  S1, S2 present, no murmurs noted.  Lungs: Rales in the bilateral bases. Without wheezing or rhonchi.  Abdomen:  Bowel sounds present, soft, non-tender, non-distended. No HSM or hernias noted. No rebound or guarding. No masses appreciated.   Extremities:  Without edema. Squeeze strength in right hand slightly decreased.  Neurologic:  Alert. Altered. Able to tell me her name.  Skin:  Warm and dry. Psych:  Flat affect.  Intake/Output from previous day: 01/02 0701 - 01/03 0700 In: 2283.4 [I.V.:950.6; IV Piggyback:1332.8] Out: 1900 [Urine:1900] Intake/Output this shift: Total I/O In: 160.8 [I.V.:100.4; IV Piggyback:60.4] Out: 350 [Urine:350]  Lab Results: Recent Labs    07/10/21 0428 07/11/21 0621 07/12/21 0359  WBC 15.2* 11.0* 11.1*  HGB 8.2* 8.7* 8.8*  HCT 25.4* 26.0* 27.1*  PLT 161 188 198   BMET Recent Labs    07/10/21 0428 07/11/21 0621 07/12/21 0359  NA 139 141 144  K 3.9 3.2* 3.4*  CL 111 109 108  CO2 18* 21* 25  GLUCOSE 111* 109* 88  BUN 10 21 24*  CREATININE 0.98 1.10* 1.25*  CALCIUM 8.1* 8.2* 8.3*   LFT Recent Labs    07/10/21 0428 07/11/21 0621  PROT 6.7 7.0  ALBUMIN 2.5* 2.4*  AST 70* 77*  ALT 23 29  ALKPHOS 61 68  BILITOT 0.6 0.6    Studies/Results: DG CHEST PORT 1 VIEW  Result Date: 07/12/2021 CLINICAL DATA:  Shortness of breath. EXAM: PORTABLE CHEST 1 VIEW COMPARISON:  July 11, 2021 FINDINGS: Marked severity diffuse bilateral infiltrates. This is slightly more prominent within the bilateral lung bases and is increased in severity when compared to the prior study. There is no evidence of a pleural effusion or pneumothorax. The heart size and mediastinal contours are within normal limits. The visualized skeletal structures are unremarkable. IMPRESSION: Marked severity diffuse bilateral infiltrates,  increased in severity when compared to the prior study. Electronically Signed   By: Virgina Norfolk M.D.   On: 07/12/2021 00:28   DG CHEST PORT 1 VIEW  Result Date: 07/11/2021 CLINICAL DATA:  Acute respiratory failure and hypoxia.  Pneumonia. EXAM: PORTABLE CHEST 1 VIEW COMPARISON:  07/09/2021 FINDINGS: Stable cardiomediastinal contours. Bilateral increase interstitial opacities are identified, left greater than right. The appearance is unchanged from previous exam. No new findings. IMPRESSION: No change in aeration to the lungs compared with previous exam. Electronically Signed   By: Kerby Moors M.D.   On: 07/11/2021 06:39    Assessment: 71 year old female with a history of tobacco abuse, COPD, hypertension, hyperlipidemia, depression, admitted with sepsis in the setting of bilateral pneumonia with acute respiratory failure.  She also reported a few days of melanotic stool prior to admission and GI was consulted for further evaluation due to heme positive stool.   Melena with heme positive stool: Patient reported melena for few days prior to admission.  Stool guaiac positive on admission.  Initial hemoglobin 11.3, stable/improved from baseline in the 10 range, BUN wnl. Hemoglobin declined as low as 8.2 on 07/10/2021 and has remained stable since that time, hemoglobin 8.8 today.  She has not had any overt GI bleeding during this admission.  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  Her most recent EGD was in August 2021 which revealed gastritis, biopsies negative for H. pylori, duodenal biopsy unremarkable.  She has maintained on oral iron outpatient.  I am unable to get any information from patient today due to altered mental status, but per her consult note, she denies taking aspirin or OTC NSAIDs.  Heartburn had been well controlled on omeprazole  outpatient. She is currently on PPI drip which was started on 12/31.  Etiology of melena is unclear. Differentials include PUD, gastritis, duodenitis, and/or bleeding from small bowel AMVs. Some of her hemoglobin decline may be attributable to hemodilution as well. Ultimately would recommend EGD once stable from a respiratory standpoint. If she ends up intubated, could consider EGD before extubated.  Respiratory failure secondary to pneumonia in a patient with underlying COPD:  Continues to struggle with her breathing. Required BiPAP again overnight. On 15L [place] when I saw her stating 100%. CXR today with increased severity of diffuse bilateral infiltrates. She remains on Zosyn and doxycycline.   Altered mental status: Patient seems altered, unable to give history, little response to questioning today. Seems to be having some trouble breathing but stating 100% on 15L [place] and doesn't appear to be in distress. As today was my first day with her, I spoke with her nurse Purcell Nails who also states she is completely different than when he had her on Sunday as she was much more alert and conversant at that time. Documented confusion and increased breathing trouble  last night with Precedex increased and morphine give which may be contributing. On exam however, she does appear to have slight drooping of the right side of her mouth with asymmetric smile. Squeeze strength slightly decreased in right hand. I spoke with Dr. Carles Collet who stated he was going to see her.    Plan: Notified Dr. Carles Collet of altered mental status. He stated he was going to see her.  Continue PPI infusion for now. Will discuss possibly transitioning to IV BID with Dr. Jenetta Downer. Needs EGD once stabilized from a respiratory standpoint.  Continue to monitor H/H and for overt GI bleeding.  Management of pneumonia/respiratory failure per hospitalist.  We will continue to follow with you.    LOS: 4 days    07/12/2021, 12:20 PM   Aliene Altes,  [facility]

## 2021-07-12 NOTE — Progress Notes (Addendum)
PROGRESS NOTE  Cynthia [name] JKK:[id number] DOB: Sep 08, 1950 DOA: 06/18/2021 PCP: Celene Squibb, MD   Brief History:  71 year old female with a history of tobacco abuse, COPD, hypertension, hyperlipidemia, depression presenting with 2-day history of shortness of breath with associated nausea, vomiting, and diarrhea.  The patient also has subjective fevers and chills at home.  History is limited due to the patient's extremis.  Upon EMS arrival, she was noted to have oxygen saturation in the 80% range on room air.  Initial work-up showed a fever of 102.4 F, but she was hemodynamically stable.  She was placed on high flow nasal cannula up to 7 L.  WBC 16.7, hemoglobin 1.3, platelets 239,000.  Sodium 139, potassium 2.9, bicarbonate 17, BUN 12, creatinine 0.91.  The patient was initially started on ceftriaxone and azithromycin.  Apparently, the patient has noted some melanotic stool.  FOBT was positive.   Assessment/Plan: Sepsis -Present on admission -Secondary to pneumonia -Presented with fever, leukocytosis, and respiratory failure -Lactic acid peaked 1.7 -PCT 0.44>>2.62 -follow blood culture--neg to date -UA--no pyuria -no fever in 24 hours -repeat blood cultures neg to date -repeat UA no pyuria   Lobar pneumonia/Aspiration Pneumonia -CTA chest negative for PE, pulmonary artery with mild enlargement; bilateral GGO -Suspect component of aspiration pneumonitis given her recurrent vomiting -Continue Zosyn -MRSA screen--neg -continue doxy   Acute respiratory failure with hypoxia -pt requiring BiPAP>>weaned to 15L HF -wean as tolerated for saturation >90% -due to pneumonia in setting of underlying COPD -COVID neg -12/31 evening--resp distress>>CCM started low dose precedex -1/1--personally reviewed CXR>>pulmonary edema -1/1 and 1/2-->lasix IV x 1 -1/1 Echo EF 60-65%, no WMA, G1DD -1/2--personally reviewed CXR--increased interstitial markings -07/12/21--pulmonary  consult>>discussed with Dr. Halford Chessman increase solumedrol to 40 mg Q6 hour  Acute metabolic Encephalopathy -01/08/68 nights>>maxed on precedex and given extra morphine -1/3/223>>wean precedex to 0.5; d/c morphine -07/12/21 CT brain>>new small focus of low attenuation in the left frontal cortex  -1/323 ordered MR brain -07/12/21 start empiric ASA   Essential hypertension -Holding antihypertensive agents at this time and monitor clinically   Hypokalemia/Hypomagnesemia -Repleted   Diarrhea -cdiff neg -GI pathogen panel--neg   Hyperlipidemia -continue statin   FOBT positive -not stable presently for endoscopic eval -continue protonix -SCDs   Chronic Pain syndrome -she is on buprenorphine patch 7.5 mcg/hr and lyrica             Family Communication:  no  Family at bedside   Consultants:  none   Code Status:  FULL    DVT Prophylaxis:  SCDs     Procedures: As Listed in Progress Note Above   Antibiotics: Zosyn 12/31>> Doxy 12/31>>     Subjective: Patient is awake and follows simple commands.  Denies cp, sob, abd pain.  No vomiting or diarrhea  Objective: Vitals:   07/12/21 1100 07/12/21 1200 07/12/21 1300 07/12/21 1400  BP: (!) 128/55 (!) 114/43 116/65 121/64  Pulse:      Resp: (!) 26 17 (!) 23 (!) 23  Temp:  (!) 97.5 F (36.4 C)    TempSrc:  Axillary    SpO2:      Weight:      Height:        Intake/Output Summary (Last 24 hours) at 07/12/2021 1512 Last data filed at 07/12/2021 0942 Gross per 24 hour  Intake 2444.13 ml  Output 1750 ml  Net 694.13 ml   Weight change: -0.8 kg Exam:  General:  Pt  is alert, follows commands appropriately, not in acute distress HEENT: No icterus, No thrush, No neck mass, [place]/AT Cardiovascular: RRR, S1/S2, no rubs, no gallops Respiratory: bilateral rales. No wheeze Abdomen: Soft/+BS, non tender, non distended, no guarding Extremities: No edema, No lymphangitis, No petechiae, No rashes, no synovitis   Data Reviewed: I have  personally reviewed following labs and imaging studies Basic Metabolic Panel: Recent Labs  Lab 07/02/2021 2116 06/20/2021 2117 07/09/21 0534 07/09/21 2149 07/10/21 0428 07/11/21 0621 07/12/21 0359  NA 139  --  139  --  139 141 144  K 2.9*  --  2.9* 2.9* 3.9 3.2* 3.4*  CL 109  --  110  --  111 109 108  CO2 17*  --  17*  --  18* 21* 25  GLUCOSE 143*  --  137*  --  111* 109* 88  BUN 18  --  12  --  10 21 24*  CREATININE 1.14*  --  0.91  --  0.98 1.10* 1.25*  CALCIUM 9.2  --  8.4*  --  8.1* 8.2* 8.3*  MG  --    < > 1.7 1.5* 2.2 1.8 2.3  PHOS  --   --  2.5  --   --   --   --    < > = values in this interval not displayed.   Liver Function Tests: Recent Labs  Lab 07/02/2021 2116 07/09/21 0534 07/10/21 0428 07/11/21 0621  AST 64* 63* 70* 77*  ALT 29 27 23 29   ALKPHOS 62 57 61 68  BILITOT 1.0 0.5 0.6 0.6  PROT 8.7* 7.4 6.7 7.0  ALBUMIN 3.3* 2.8* 2.5* 2.4*   No results for input(s): LIPASE, AMYLASE in the last 168 hours. No results for input(s): AMMONIA in the last 168 hours. Coagulation Profile: Recent Labs  Lab 06/16/2021 2116  INR 1.2   CBC: Recent Labs  Lab 06/20/2021 2117 07/09/21 0534 07/10/21 0428 07/11/21 0621 07/12/21 0359  WBC 16.7* 19.2* 15.2* 11.0* 11.1*  HGB 11.3* 9.9* 8.2* 8.7* 8.8*  HCT 33.9* 30.5* 25.4* 26.0* 27.1*  MCV 86.5 85.7 86.4 85.2 84.2  PLT 239 198 161 188 198   Cardiac Enzymes: No results for input(s): CKTOTAL, CKMB, CKMBINDEX, TROPONINI in the last 168 hours. BNP: Invalid input(s): POCBNP CBG: Recent Labs  Lab 06/13/2021 2122 07/10/21 0745  GLUCAP 144* 116*   HbA1C: No results for input(s): HGBA1C in the last 72 hours. Urine analysis:    Component Value Date/Time   COLORURINE YELLOW 07/11/2021 [place] 07/11/2021 1511   LABSPEC 1.015 07/11/2021 1511   PHURINE 5.5 07/11/2021 1511   GLUCOSEU NEGATIVE 07/11/2021 1511   HGBUR TRACE (A) 07/11/2021 1511   HGBUR moderate 11/11/2008 0921   BILIRUBINUR NEGATIVE 07/11/2021  1511   KETONESUR NEGATIVE 07/11/2021 1511   PROTEINUR NEGATIVE 07/11/2021 1511   UROBILINOGEN 0.2 11/11/2008 0921   NITRITE NEGATIVE 07/11/2021 1511   LEUKOCYTESUR NEGATIVE 07/11/2021 1511   Sepsis Labs: @LABRCNTIP (procalcitonin:4,lacticidven:4) ) Recent Results (from the past 240 hour(s))  Urine Culture     Status: None   Collection Time: 06/12/2021  9:16 PM   Specimen: In/Out Cath Urine  Result Value Ref Range Status   Specimen Description   Final    IN/OUT CATH URINE Performed at [facility], [address] [place], [place] [address]    Special Requests   Final    NONE Performed at [facility], [address] [place], [place] [address]    Culture  Final    NO GROWTH Performed at [facility], [place] [address], [place], [place] [address]    Report Status 07/10/2021 FINAL  Final  Blood Culture (routine x 2)     Status: None (Preliminary result)   Collection Time: 06/09/2021  9:19 PM   Specimen: Right Antecubital; Blood  Result Value Ref Range Status   Specimen Description   Final    RIGHT ANTECUBITAL BOTTLES DRAWN AEROBIC AND ANAEROBIC   Special Requests Blood Culture adequate volume  Final   Culture   Final    NO GROWTH 4 DAYS Performed at [facility], [address] [place], [place] [address]    Report Status PENDING  Incomplete  Blood Culture (routine x 2)     Status: None (Preliminary result)   Collection Time: 06/24/2021  9:19 PM   Specimen: Left Antecubital; Blood  Result Value Ref Range Status   Specimen Description   Final    LEFT ANTECUBITAL BOTTLES DRAWN AEROBIC AND ANAEROBIC   Special Requests Blood Culture adequate volume  Final   Culture   Final    NO GROWTH 4 DAYS Performed at [facility], [address] [place],  [address]    Report Status PENDING  Incomplete  Resp Panel by RT-PCR (Flu A&B, Covid) Nasopharyngeal Swab     Status: None   Collection Time: 06/26/2021  9:23 PM   Specimen: Nasopharyngeal Swab; Nasopharyngeal(NP) swabs in vial  transport medium  Result Value Ref Range Status   SARS Coronavirus 2 by RT PCR NEGATIVE NEGATIVE Final    Comment: (NOTE) SARS-CoV-2 target nucleic acids are NOT DETECTED.  The SARS-CoV-2 RNA is generally detectable in upper respiratory specimens during the acute phase of infection. The lowest concentration of SARS-CoV-2 viral copies this assay can detect is 138 copies/mL. A negative result does not preclude SARS-Cov-2 infection and should not be used as the sole basis for treatment or other patient management decisions. A negative result may occur with  improper specimen collection/handling, submission of specimen other than nasopharyngeal swab, presence of viral mutation(s) within the areas targeted by this assay, and inadequate number of viral copies(<138 copies/mL). A negative result must be combined with clinical observations, patient history, and epidemiological information. The expected result is Negative.  Fact Sheet for Patients:  EntrepreneurPulse.com.au  Fact Sheet for Healthcare Providers:  IncredibleEmployment.be  This test is no t yet approved or cleared by the [place] FDA and  has been authorized for detection and/or diagnosis of SARS-CoV-2 by FDA under an Emergency Use Authorization (EUA). This EUA will remain  in effect (meaning this test can be used) for the duration of the COVID-19 declaration under Section 564(b)(1) of the Act, 21 U.S.C.section 360bbb-3(b)(1), unless the authorization is terminated  or revoked sooner.       Influenza A by PCR NEGATIVE NEGATIVE Final   Influenza B by PCR NEGATIVE NEGATIVE Final    Comment: (NOTE) The Xpert Xpress SARS-CoV-2/FLU/RSV plus assay is intended as an aid in the diagnosis of influenza from Nasopharyngeal swab specimens and should not be used as a sole basis for treatment. Nasal washings and aspirates are unacceptable for Xpert Xpress SARS-CoV-2/FLU/RSV testing.  Fact  Sheet for Patients: EntrepreneurPulse.com.au  Fact Sheet for Healthcare Providers: IncredibleEmployment.be  This test is not yet approved or cleared by the [place] FDA and has been authorized for detection and/or diagnosis of SARS-CoV-2 by FDA under an Emergency Use Authorization (EUA). This EUA will remain in effect (meaning this test can  be used) for the duration of the COVID-19 declaration under Section 564(b)(1) of the Act, 21 U.S.C. section 360bbb-3(b)(1), unless the authorization is terminated or revoked.  Performed at [facility], [address] [place], [place] [address]   C Difficile Quick Screen w PCR reflex     Status: None   Collection Time: 07/09/21 12:14 AM   Specimen: Stool  Result Value Ref Range Status   C Diff antigen NEGATIVE NEGATIVE Final   C Diff toxin NEGATIVE NEGATIVE Final   C Diff interpretation No C. difficile detected.  Final    Comment: Performed at [facility], [address], [place], [place] [address]  Gastrointestinal Panel by PCR , Stool     Status: None   Collection Time: 07/09/21 12:14 AM   Specimen: Stool  Result Value Ref Range Status   Campylobacter species NOT DETECTED NOT DETECTED Final   Plesimonas shigelloides NOT DETECTED NOT DETECTED Final   Salmonella species NOT DETECTED NOT DETECTED Final   Yersinia enterocolitica NOT DETECTED NOT DETECTED Final   Vibrio species NOT DETECTED NOT DETECTED Final   Vibrio cholerae NOT DETECTED NOT DETECTED Final   Enteroaggregative E coli (EAEC) NOT DETECTED NOT DETECTED Final   Enteropathogenic E coli (EPEC) NOT DETECTED NOT DETECTED Final   Enterotoxigenic E coli (ETEC) NOT DETECTED NOT DETECTED Final   Shiga like toxin producing E coli (STEC) NOT DETECTED NOT DETECTED Final   Shigella/Enteroinvasive E coli (EIEC) NOT DETECTED NOT DETECTED Final   Cryptosporidium NOT DETECTED NOT DETECTED Final   Cyclospora cayetanensis NOT DETECTED NOT DETECTED Final    Entamoeba histolytica NOT DETECTED NOT DETECTED Final   Giardia lamblia NOT DETECTED NOT DETECTED Final   Adenovirus F40/41 NOT DETECTED NOT DETECTED Final   Astrovirus NOT DETECTED NOT DETECTED Final   Norovirus GI/GII NOT DETECTED NOT DETECTED Final   Rotavirus A NOT DETECTED NOT DETECTED Final   Sapovirus (I, II, IV, and V) NOT DETECTED NOT DETECTED Final    Comment: Performed at [facility], [address] [place], [place] [address]  MRSA Next Gen by PCR, Nasal     Status: None   Collection Time: 07/09/21  3:13 AM   Specimen: Nasal Mucosa; Nasal Swab  Result Value Ref Range Status   MRSA by PCR Next Gen NOT DETECTED NOT DETECTED Final    Comment: (NOTE) The GeneXpert MRSA Assay (FDA approved for NASAL specimens only), is one component of a comprehensive MRSA colonization surveillance program. It is not intended to diagnose MRSA infection nor to guide or monitor treatment for MRSA infections. Test performance is not FDA approved in patients less than 76 years old. Performed at [facility], [address], [place], [place] [address]   Culture, blood (routine x 2)     Status: None (Preliminary result)   Collection Time: 07/10/21  6:02 PM   Specimen: BLOOD RIGHT HAND  Result Value Ref Range Status   Specimen Description   Final    BLOOD RIGHT HAND BOTTLES DRAWN AEROBIC AND ANAEROBIC   Special Requests   Final    Blood Culture results may not be optimal due to an excessive volume of blood received in culture bottles   Culture   Final    NO GROWTH 2 DAYS Performed at [facility], [address], [place],  [address]    Report Status PENDING  Incomplete  Culture, blood (routine x 2)     Status: None (Preliminary result)   Collection Time: 07/10/21  6:02  PM   Specimen: BLOOD LEFT HAND  Result Value Ref Range Status   Specimen Description   Final    BLOOD LEFT HAND BOTTLES DRAWN AEROBIC AND ANAEROBIC   Special Requests   Final    Blood Culture results may not be  optimal due to an excessive volume of blood received in culture bottles   Culture   Final    NO GROWTH 2 DAYS Performed at [facility], [address], [place], [place] [address]    Report Status PENDING  Incomplete     Scheduled Meds:  acetaminophen  1,000 mg Oral Q6H   arformoterol  15 mcg Nebulization BID   atorvastatin  40 mg Oral Daily   budesonide (PULMICORT) nebulizer solution  0.5 mg Nebulization BID   Chlorhexidine Gluconate Cloth  6 each Topical Q0600   methylPREDNISolone (SOLU-MEDROL) injection  40 mg Intravenous Q6H   pantoprazole  40 mg Intravenous Q12H   revefenacin  175 mcg Nebulization Daily   Continuous Infusions:  sodium chloride 10 mL/hr at 07/09/21 0101   dexmedetomidine (PRECEDEX) IV infusion 0.2 mcg/kg/hr (07/12/21 0942)   doxycycline (VIBRAMYCIN) IV 100 mg (07/12/21 1121)   pantoprazole 8 mg/hr (07/12/21 0635)   piperacillin-tazobactam (ZOSYN)  IV 3.375 g (07/12/21 0541)    Procedures/Studies: CT Angio Chest Pulmonary Embolism (PE) W or WO Contrast  Result Date: 07/09/2021 CLINICAL DATA:  Positive D-dimer with pulmonary embolism suspected EXAM: CT ANGIOGRAPHY CHEST WITH CONTRAST TECHNIQUE: Multidetector CT imaging of the chest was performed using the standard protocol during bolus administration of intravenous contrast. Multiplanar CT image reconstructions and MIPs were obtained to evaluate the vascular anatomy. CONTRAST:  153mL OMNIPAQUE IOHEXOL 350 MG/ML SOLN COMPARISON:  10/22/2018 FINDINGS: Cardiovascular: Satisfactory opacification of the pulmonary arteries to the segmental level. No evidence of pulmonary embolism when accounting for unavoidable motion artifact. Normal heart size. No pericardial effusion. Main pulmonary is enlarged to 4 cm in diameter. Atheromatous calcification of the aorta. Mediastinum/Nodes: No adenopathy or mass.  Benign-appearing thyroid Lungs/Pleura: Ground-glass opacity bilateral lungs which are relatively low volume. No honeycombing  noted. No edema, effusion, or pneumothorax. Upper Abdomen: Negative Musculoskeletal: No acute or aggressive finding Review of the MIP images confirms the above findings. IMPRESSION: 1. Bilateral pneumonia. 2. Negative for pulmonary embolism when accounting for motion artifact. Electronically Signed   By: Jorje Guild M.D.   On: 07/09/2021 04:07   DG CHEST PORT 1 VIEW  Result Date: 07/12/2021 CLINICAL DATA:  Shortness of breath. EXAM: PORTABLE CHEST 1 VIEW COMPARISON:  July 11, 2021 FINDINGS: Marked severity diffuse bilateral infiltrates. This is slightly more prominent within the bilateral lung bases and is increased in severity when compared to the prior study. There is no evidence of a pleural effusion or pneumothorax. The heart size and mediastinal contours are within normal limits. The visualized skeletal structures are unremarkable. IMPRESSION: Marked severity diffuse bilateral infiltrates, increased in severity when compared to the prior study. Electronically Signed   By: Virgina Norfolk M.D.   On: 07/12/2021 00:28   DG CHEST PORT 1 VIEW  Result Date: 07/11/2021 CLINICAL DATA:  Acute respiratory failure and hypoxia.  Pneumonia. EXAM: PORTABLE CHEST 1 VIEW COMPARISON:  07/09/2021 FINDINGS: Stable cardiomediastinal contours. Bilateral increase interstitial opacities are identified, left greater than right. The appearance is unchanged from previous exam. No new findings. IMPRESSION: No change in aeration to the lungs compared with previous exam. Electronically Signed   By: Kerby Moors M.D.   On: 07/11/2021 06:39   DG CHEST  PORT 1 VIEW  Result Date: 07/09/2021 CLINICAL DATA:  Worsening SOB. Hx of bronchitis, COPD, HTN, pneumonia, and current smoker of 0.25 packs a day x 42 years. Negative covid, flu, and RSV yesterday. EXAM: PORTABLE CHEST 1 VIEW COMPARISON:  CT angio chest 07/09/2021, chest x-ray 06/17/2021 FINDINGS: The heart and mediastinal contours are unchanged. Aortic calcification.  Interval increased interstitial and airspace opacities with prominence within the mid to lower lungs. No pleural effusion. No pneumothorax. No acute osseous abnormality. IMPRESSION: Interval increase in marked interstitial and airspace opacities with prominence within the mid to lower lungs. Findings consistent with pulmonary edema. Electronically Signed   By: Iven Finn M.D.   On: 07/09/2021 22:13   DG Chest Portable 1 View  Result Date: 06/16/2021 CLINICAL DATA:  Shortness of breath.  Sepsis workup. EXAM: PORTABLE CHEST 1 VIEW COMPARISON:  Chest CT with contrast and portable chest both 10/22/2018. FINDINGS: There is mild cardiomegaly, normal caliber central vessels. There is a low inspiration. Interstitial and patchy hazy opacities in the left mid to lower lung field are noted most likely due to pneumonia. The remaining hypoinflated lungs are clear. No pleural effusion is seen. There is calcification of the transverse aorta. Thoracic spondylosis. IMPRESSION: Interstitial and patchy hazy opacities of the left mid to lower lung field, most likely due to pneumonia. This could all be in the upper lobe or could be a combination of upper and lower lobe disease. Clinical correlation and radiographic follow-up recommended. Limited view of the lung bases due to low inspiration Electronically Signed   By: Telford Nab M.D.   On: 07/06/2021 21:50   ECHOCARDIOGRAM COMPLETE  Result Date: 07/10/2021    ECHOCARDIOGRAM REPORT   Patient Name:   Cynthia [name] Date of Exam: 07/10/2021 Medical Rec #:  [id number]         Height:       63.0 in Accession #:    [id number]        Weight:       133.6 lb Date of Birth:  07-17-1950         BSA:          1.629 m Patient Age:    28 years          BP:           123/55 mmHg Patient Gender: F                 HR:           77 bpm. Exam Location:  [facility] Procedure: 2D Echo, Cardiac Doppler and Color Doppler Indications:    CHF  History:        Patient has prior history of  Echocardiogram examinations, most                 recent 06/08/2017. COPD; Risk Factors:Hypertension and                 Dyslipidemia. Tobacco abuse.  Sonographer:    Wenda Low Referring Phys: [phone] Consuelo Thayne IMPRESSIONS  1. Left ventricular ejection fraction, by estimation, is 60 to 65%. Left ventricular ejection fraction by PLAX is 64 %. The left ventricle has normal function. The left ventricle has no regional wall motion abnormalities. There is mild concentric left ventricular hypertrophy. Left ventricular diastolic parameters are consistent with Grade I diastolic dysfunction (impaired relaxation).  2. Right ventricular systolic function is normal. The right ventricular size is normal. There is normal pulmonary artery systolic pressure.  3. The mitral valve is normal in structure. Trivial mitral valve regurgitation. No evidence of mitral stenosis.  4. The aortic valve is normal in structure. Aortic valve regurgitation is trivial. No aortic stenosis is present.  5. The inferior vena cava is dilated in size with >50% respiratory variability, suggesting right atrial pressure of 8 mmHg. FINDINGS  Left Ventricle: Left ventricular ejection fraction, by estimation, is 60 to 65%. Left ventricular ejection fraction by PLAX is 64 %. The left ventricle has normal function. The left ventricle has no regional wall motion abnormalities. The left ventricular internal cavity size was normal in size. There is mild concentric left ventricular hypertrophy. Left ventricular diastolic parameters are consistent with Grade I diastolic dysfunction (impaired relaxation). Right Ventricle: The right ventricular size is normal. No increase in right ventricular wall thickness. Right ventricular systolic function is normal. There is normal pulmonary artery systolic pressure. The tricuspid regurgitant velocity is 2.44 m/s, and  with an assumed right atrial pressure of 8 mmHg, the estimated right ventricular systolic pressure is 29.5  mmHg. Left Atrium: Left atrial size was normal in size. Right Atrium: Right atrial size was normal in size. Pericardium: There is no evidence of pericardial effusion. Mitral Valve: The mitral valve is normal in structure. Trivial mitral valve regurgitation. No evidence of mitral valve stenosis. MV peak gradient, 6.2 mmHg. The mean mitral valve gradient is 2.0 mmHg. Tricuspid Valve: The tricuspid valve is normal in structure. Tricuspid valve regurgitation is trivial. No evidence of tricuspid stenosis. Aortic Valve: The aortic valve is normal in structure. Aortic valve regurgitation is trivial. No aortic stenosis is present. Aortic valve mean gradient measures 5.0 mmHg. Aortic valve peak gradient measures 9.5 mmHg. Aortic valve area, by VTI measures 2.16 cm. Pulmonic Valve: The pulmonic valve was normal in structure. Pulmonic valve regurgitation is not visualized. No evidence of pulmonic stenosis. Aorta: The aortic root is normal in size and structure. Venous: The inferior vena cava is dilated in size with greater than 50% respiratory variability, suggesting right atrial pressure of 8 mmHg. IAS/Shunts: No atrial level shunt detected by color flow Doppler.  LEFT VENTRICLE PLAX 2D LV EF:         Left            Diastology                ventricular     LV e' medial:  6.31 cm/s                ejection        LV e' lateral: 8.16 cm/s                fraction by                PLAX is 64                %. LVIDd:         4.30 cm LVIDs:         2.80 cm LV PW:         1.20 cm LV IVS:        1.70 cm LVOT diam:     2.00 cm LV SV:         77 LV SV Index:   47 LVOT Area:     3.14 cm  LV Volumes (MOD) LV vol d, MOD    39.6 ml A2C: LV vol d, MOD    42.4 ml A4C: LV vol s, MOD  12.1 ml A2C: LV vol s, MOD    13.7 ml A4C: LV SV MOD A2C:   27.5 ml LV SV MOD A4C:   42.4 ml LV SV MOD BP:    28.3 ml RIGHT VENTRICLE RV Basal diam:  3.50 cm RV Mid diam:    2.70 cm RV S prime:     12.90 cm/s TAPSE (M-mode): 2.4 cm LEFT ATRIUM              Index        RIGHT ATRIUM          Index LA diam:        3.40 cm 2.09 cm/m   RA Area:     9.87 cm LA Vol (A2C):   25.5 ml 15.65 ml/m  RA Volume:   17.20 ml 10.56 ml/m LA Vol (A4C):   23.9 ml 14.67 ml/m LA Biplane Vol: 27.3 ml 16.76 ml/m  AORTIC VALVE                    PULMONIC VALVE AV Area (Vmax):    2.28 cm     PV Vmax:       0.89 m/s AV Area (Vmean):   2.15 cm     PV Peak grad:  3.1 mmHg AV Area (VTI):     2.16 cm AV Vmax:           154.00 cm/s AV Vmean:          97.500 cm/s AV VTI:            0.357 m AV Peak Grad:      9.5 mmHg AV Mean Grad:      5.0 mmHg LVOT Vmax:         112.00 cm/s LVOT Vmean:        66.600 cm/s LVOT VTI:          0.245 m LVOT/AV VTI ratio: 0.69  AORTA Ao Root diam: 3.20 cm Ao Asc diam:  2.60 cm MITRAL VALVE              TRICUSPID VALVE MV Area (PHT): 3.24 cm   TR Peak grad:   23.8 mmHg MV Area VTI:   2.16 cm   TR Vmax:        244.00 cm/s MV Peak grad:  6.2 mmHg MV Mean grad:  2.0 mmHg   SHUNTS MV Vmax:       1.24 m/s   Systemic VTI:  0.24 m MV Vmean:      69.4 cm/s  Systemic Diam: 2.00 cm Skeet Latch MD Electronically signed by Skeet Latch MD Signature Date/Time: 07/10/2021/11:53:33 AM    Final     Orson Eva, DO  [facility]  If 7PM-7AM, please contact night-coverage [email or web address] Password [email or web address] 07/12/2021, 3:12 PM   LOS: 4 days

## 2021-07-12 NOTE — Progress Notes (Signed)
[facility] Progress Note Patient Name: Cynthia [name] DOB: 24-Jul-1950 MRN: [id number]   Date of Service  07/12/2021  HPI/Events of Note  Notified of respiratory distress despite BiPap 14/7, Precedex drip at 1.2 and morphine 1 mg prn.  Being managed as HFpEF and COPD/pneumonia  Shortness of breath aggravated by coughing  BP 156/71  HR 103  O2 100% CXR with increased bilateral infiltrates more on the bases. HIV non reactive. 3 liters positive since admission  eICU Interventions  Discussed with bedside MD  Increase morphine to 2 mg as the 1 mg seems to have helped but shortlived Increase EPAP to 10 Will give another dose of Lasix 40 mg IV, replaced K 20 additional 20 meqs  Will reassess after above intervention     Intervention Category Intermediate Interventions: Respiratory distress - evaluation and management  Judd Lien 07/12/2021, 12:33 AM

## 2021-07-12 NOTE — Consult Note (Signed)
[facility]   Patient name: Cynthia [name] Admit date: 06/22/2021  DOB: 02-11-1951 LOS: 4  MRN: [id number] Consult date: 07/12/2021  Referring provider: Dr. Carles Collet, [facility]: dyspnea    History:  71 yo female smoker presented from home to [facility] ER with 2 days of dyspnea, headache.  She also had nausea, vomiting, and diarrhea.  She noted progressive weakness.  SpO2 89% in ER on room air and fever with Tm 102.60F.  She has history of COPD.  Started on therapy for sepsis from community acquired pneumonia.  Needed Bipap and high flow oxygen.  PCCM consulted to assist with respiratory management.  Past medical history:  GERD, HLD, Migraine headaches, Anemia, Anxiety, OA, Depression, HCV, HTN, PTSD, Cocaine abuse in remission, Chronic pain  Significant events:  12/30 Admit 12/31 start intermittent Bipap use, start precedex 01/01 GI consult for melena 01/03 increased dyspnea/hypoxia; start solumedrol  Studies:  CT angio chest 07/09/21 >> diffuse b/l GGO Echo 07/10/21 >> EF 60 to 65%, mild LVH, grade 1 DD  Micro:  Blood 12/30 >> Pneumococcal Ag 12/30 >> negative Legionella Ag 12/30 >> negative COVID/flu PCR 12/30 >> negative Blood 1/01 >>   Lines:     Antibiotics:  Rocephin 12/30 Zithromax 12/30 Zosyn 12/31 >> Doxycycline 12/31 >>   Consults:  GI     Interim history:  She continues to feel short of breath.  Says she hasn't used cocaine for a long time, but can't specify how long.  Says she quit smoking when she came to the hospital.  Had similar respiratory event in 2014 and improved with course of steroids.  Vital signs:  BP (!) 114/43    Pulse 98    Temp (!) 97.5 F (36.4 C) (Axillary)    Resp 17    Ht '5\' 3"'  (1.6 m)    Wt 59.7 kg    SpO2 96%    BMI 23.31 kg/m   Intake/output:  I/O last 3 completed shifts: In: 2283.4 [I.V.:950.6; IV Piggyback:1332.8] Out: 2200 [Urine:2200]   Physical exam:   General - ill  appearing, moaning Eyes - pupils reactive ENT - no sinus tenderness, no stridor Cardiac - regular rate/rhythm, no murmur Chest - faint b/l rhonchi b/l Abdomen - soft, non tender, + bowel sounds Extremities - no cyanosis, clubbing, or edema Skin - no rashes Neuro - normal strength, moves extremities, follows commands Psych - anxious  Best practice:   DVT - SCDs SUP - Protonix Nutrition - NPO   Assessment/plan:   Acute hypoxic respiratory failure with diffuse b/l ground glass infiltrates on CT chest imaging. - hasn't improved with course of antibiotics or diuresis - she had similar episode in 2014 and responded at that time to course of solumedrol - start solumedrol 40 mg q6h on 1/03 - check ESR, ANA, RF, ANCA - day 5 of ABx, currently on doxycycline and zosyn - would hold off on further diuresis given increase in creatinine - continue high flow oxygen with prn Bipap with goal SpO2 > 90% - continue to monitor need for intubation in ICU - f/u CXR  COPD with continued tobacco abuse. - change to yupelri, brovana, pulmicort - prn albuterol  Melena, diarrhea. - GI consulted - deferring endoscopy until respiratory status improved  Acute blood loss anemia from GI bleeding NOS. - f/u CBC - transfuse for Hb < 7  Altered mental status. - CT head ordered by primary team  Hx of HTN, HLD, Chronic pain. - per  primary team  Resolved hospital problems:    Goals of care/Family discussions:  Code status: Full  Labs:   CMP Latest Ref Rng & Units 07/12/2021 07/11/2021 07/10/2021  Glucose 70 - 99 mg/dL 88 109(H) 111(H)  BUN 8 - 23 mg/dL 24(H) 21 10  Creatinine 0.44 - 1.00 mg/dL 1.25(H) 1.10(H) 0.98  Sodium 135 - 145 mmol/L 144 141 139  Potassium 3.5 - 5.1 mmol/L 3.4(L) 3.2(L) 3.9  Chloride 98 - 111 mmol/L 108 109 111  CO2 22 - 32 mmol/L 25 21(L) 18(L)  Calcium 8.9 - 10.3 mg/dL 8.3(L) 8.2(L) 8.1(L)  Total Protein 6.5 - 8.1 g/dL - 7.0 6.7  Total Bilirubin 0.3 - 1.2 mg/dL - 0.6 0.6   Alkaline Phos 38 - 126 U/L - 68 61  AST 15 - 41 U/L - 77(H) 70(H)  ALT 0 - 44 U/L - 29 23    CBC Latest Ref Rng & Units 07/12/2021 07/11/2021 07/10/2021  WBC 4.0 - 10.5 K/uL 11.1(H) 11.0(H) 15.2(H)  Hemoglobin 12.0 - 15.0 g/dL 8.8(L) 8.7(L) 8.2(L)  Hematocrit 36.0 - 46.0 % 27.1(L) 26.0(L) 25.4(L)  Platelets 150 - 400 K/uL 198 188 161    ABG    Component Value Date/Time   PHART 7.325 (L) 07/11/2021 2302   PCO2ART 43.1 07/11/2021 2302   PO2ART 52.9 (L) 07/11/2021 2302   HCO3 23.2 07/12/2021 1228   TCO2 30 01/17/2013 0436   ACIDBASEDEF 1.0 07/12/2021 1228   O2SAT 85.3 07/12/2021 1228    CBG (last 3)  Recent Labs    07/10/21 0745  GLUCAP 116*     Past surgical history:  She  has a past surgical history that includes left arm; Tonsillectomy; Colonoscopy with propofol (N/A, 02/09/2015); Esophagogastroduodenoscopy (egd) with propofol (N/A, 02/09/2015); Savory dilation (N/A, 02/09/2015); biopsy (02/09/2015); left elbow surgery (Left); Givens capsule study (N/A, 10/19/2016); Tubal ligation; Esophagogastroduodenoscopy (egd) with propofol (N/A, 02/24/2020); and biopsy (02/24/2020).  Social history:  She  reports that she has been smoking cigarettes. She started smoking about 48 years ago. She has a 10.50 pack-year smoking history. She has never used smokeless tobacco. She reports that she does not currently use drugs after having used the following drugs: "Crack" cocaine and Marijuana. She reports that she does not drink alcohol.   Review of systems:  Reviewed and negative  Family history:  Her family history includes Cancer in her father and mother; Colon cancer in her father; Dementia in her father; Heart failure in her father; Hypertension in her father; Seizures in her sister; Stroke in her father.    Medications:   No current facility-administered medications on file prior to encounter.   Current Outpatient Medications on File Prior to Encounter  Medication Sig   albuterol (PROVENTIL)  (2.5 MG/3ML) 0.083% nebulizer solution INHALE 1 VIAL VIA NEBULIZER EVERY 6 HOURS AS NEEDED FOR WHEEZING AND SHORTNESS OF BREATH   albuterol (VENTOLIN HFA) 108 (90 Base) MCG/ACT inhaler Inhale 1 puff into the lungs every 6 (six) hours as needed for wheezing or shortness of breath.    amLODipine (NORVASC) 10 MG tablet Take 10 mg by mouth daily.   Ascorbic Acid (VITAMIN C PO) Take 1 tablet by mouth daily.   atorvastatin (LIPITOR) 40 MG tablet Take 40 mg by mouth daily.    buprenorphine (BUTRANS) 7.5 MCG/HR Place 1 patch onto the skin once a week.   butalbital-acetaminophen-caffeine (FIORICET) 50-325-40 MG tablet Take 1 tablet by mouth 2 (two) times daily as needed.   Calcium-Magnesium-Zinc (CAL-MAG-ZINC PO) Take 1 tablet  by mouth daily.    ferrous sulfate 325 (65 FE) MG tablet Take 325 mg by mouth daily with breakfast.   furosemide (LASIX) 20 MG tablet TAKE ONE TABLET BY mouth daily prn   Multiple Vitamin (MULTIVITAMIN) tablet Take 1 tablet by mouth daily.   nitroGLYCERIN (NITROSTAT) 0.4 MG SL tablet Place 0.4 mg under the tongue every 5 (five) minutes as needed for chest pain.   omeprazole (PRILOSEC) 20 MG capsule TAKE 1 CAPSULE BY MOUTH DAILY BEFORE A MEAL. (Patient taking differently: Take 20 mg by mouth as needed.)   pregabalin (LYRICA) 75 MG capsule Take 75 mg by mouth daily.   propranolol (INDERAL) 80 MG tablet Take 1 tablet (80 mg total) by mouth 2 (two) times daily.   tiZANidine (ZANAFLEX) 2 MG tablet Take 1-2 tablets by mouth every 8 (eight) hours as needed.   topiramate (TOPAMAX) 25 MG tablet Take 25 mg by mouth at bedtime.   traMADol (ULTRAM) 50 MG tablet Take 50-100 mg by mouth every 6 (six) hours as needed.   traZODone (DESYREL) 50 MG tablet Take 50-75 mg by mouth at bedtime as needed for sleep.     Critical care time: 38 minutes  Chesley Mires, MD [place] Pager - [phone] 07/12/2021, 2:28 PM

## 2021-07-12 NOTE — Progress Notes (Signed)
Received call from radiology regarding MR brain results -multiple areas of DWI in different vascular territories, suggesting possible embolic stroke -place consult for neurology  -start asa and plavix for now -07/10/21 Echo EF 60-65%, G1DD, trivial MR/TR; no embolic source -order carotid US -am lipid panel, A1C  Shanon Brow Anali Cabanilla

## 2021-07-12 NOTE — Progress Notes (Signed)
**Note De-Identified Darick Fetters Obfuscation** Patient removed from BIPAP and placed onto 15L HFNC; tolerating well at this time, VS WNL.  RRT to continue to monitor.

## 2021-07-12 NOTE — Progress Notes (Signed)
Patient currently as NG tube in place and is doing well on [place].  Bipap is not recommended while NG is in place.

## 2021-07-13 ENCOUNTER — Inpatient Hospital Stay (HOSPITAL_COMMUNITY): Payer: Medicare Other

## 2021-07-13 DIAGNOSIS — I63413 Cerebral infarction due to embolism of bilateral middle cerebral arteries: Secondary | ICD-10-CM

## 2021-07-13 DIAGNOSIS — J189 Pneumonia, unspecified organism: Secondary | ICD-10-CM

## 2021-07-13 DIAGNOSIS — Z978 Presence of other specified devices: Secondary | ICD-10-CM

## 2021-07-13 DIAGNOSIS — J9621 Acute and chronic respiratory failure with hypoxia: Secondary | ICD-10-CM

## 2021-07-13 LAB — BASIC METABOLIC PANEL
Anion gap: 11 (ref 5–15)
BUN: 30 mg/dL — ABNORMAL HIGH (ref 8–23)
CO2: 23 mmol/L (ref 22–32)
Calcium: 8.4 mg/dL — ABNORMAL LOW (ref 8.9–10.3)
Chloride: 114 mmol/L — ABNORMAL HIGH (ref 98–111)
Creatinine, Ser: 1.1 mg/dL — ABNORMAL HIGH (ref 0.44–1.00)
GFR, Estimated: 55 mL/min — ABNORMAL LOW (ref >60)
Glucose, Bld: 140 mg/dL — ABNORMAL HIGH (ref 70–99)
Potassium: 3.6 mmol/L (ref 3.5–5.1)
Sodium: 148 mmol/L — ABNORMAL HIGH (ref 135–145)

## 2021-07-13 LAB — BLOOD GAS, ARTERIAL
Acid-base deficit: 7.1 mmol/L — ABNORMAL HIGH (ref 0.0–2.0)
Bicarbonate: 18.4 mmol/L — ABNORMAL LOW (ref 20.0–28.0)
Drawn by: 41977
FIO2: 100
O2 Saturation: 98.3 %
Patient temperature: 37
pCO2 arterial: 41.2 mmHg (ref 32.0–48.0)
pH, Arterial: 7.274 — ABNORMAL LOW (ref 7.350–7.450)
pO2, Arterial: 188 mmHg — ABNORMAL HIGH (ref 83.0–108.0)

## 2021-07-13 LAB — CBC
HCT: 29.1 % — ABNORMAL LOW (ref 36.0–46.0)
Hemoglobin: 9.4 g/dL — ABNORMAL LOW (ref 12.0–15.0)
MCH: 27.4 pg (ref 26.0–34.0)
MCHC: 32.3 g/dL (ref 30.0–36.0)
MCV: 84.8 fL (ref 80.0–100.0)
Platelets: 183 10*3/uL (ref 150–400)
RBC: 3.43 MIL/uL — ABNORMAL LOW (ref 3.87–5.11)
RDW: 16.8 % — ABNORMAL HIGH (ref 11.5–15.5)
WBC: 9.7 10*3/uL (ref 4.0–10.5)
nRBC: 0 % (ref 0.0–0.2)

## 2021-07-13 LAB — GLUCOSE, CAPILLARY
Glucose-Capillary: 147 mg/dL — ABNORMAL HIGH (ref 70–99)
Glucose-Capillary: 130 mg/dL — ABNORMAL HIGH (ref 70–99)
Glucose-Capillary: 149 mg/dL — ABNORMAL HIGH (ref 70–99)
Glucose-Capillary: 187 mg/dL — ABNORMAL HIGH (ref 70–99)

## 2021-07-13 LAB — CULTURE, BLOOD (ROUTINE X 2)
Culture: NO GROWTH
Culture: NO GROWTH
Special Requests: ADEQUATE
Special Requests: ADEQUATE

## 2021-07-13 LAB — LIPID PANEL
Cholesterol: 78 mg/dL (ref 0–200)
HDL: 11 mg/dL — ABNORMAL LOW (ref >40)
LDL Cholesterol: 38 mg/dL (ref 0–99)
Total CHOL/HDL Ratio: 7.1 RATIO
Triglycerides: 144 mg/dL (ref <150)
VLDL: 29 mg/dL (ref 0–40)

## 2021-07-13 LAB — RHEUMATOID FACTOR: Rheumatoid fact SerPl-aCnc: 21.1 IU/mL — ABNORMAL HIGH (ref <14.0)

## 2021-07-13 LAB — HEMOGLOBIN A1C
Hgb A1c MFr Bld: 5.5 % (ref 4.8–5.6)
Mean Plasma Glucose: 111.15 mg/dL

## 2021-07-13 LAB — URINE CULTURE: Culture: <10000 — AB

## 2021-07-13 LAB — ANA W/REFLEX IF POSITIVE: Anti Nuclear Antibody (ANA): NEGATIVE

## 2021-07-13 LAB — MRSA NEXT GEN BY PCR, NASAL: MRSA by PCR Next Gen: NOT DETECTED

## 2021-07-13 MED ORDER — VITAL AF 1.2 CAL PO LIQD
1000.0000 mL | ORAL | Status: DC
  Administered 2021-07-13: 1000 mL

## 2021-07-13 MED ORDER — ETOMIDATE 2 MG/ML IV SOLN
INTRAVENOUS | Status: AC
  Administered 2021-07-13: 20 mg
  Filled 2021-07-13: qty 20

## 2021-07-13 MED ORDER — ATORVASTATIN CALCIUM 40 MG PO TABS
40.0000 mg | ORAL_TABLET | Freq: Every day | ORAL | Status: DC
  Administered 2021-07-13 – 2021-07-18 (×5): 40 mg
  Filled 2021-07-13 (×6): qty 1

## 2021-07-13 MED ORDER — FENTANYL CITRATE PF 50 MCG/ML IJ SOSY
PREFILLED_SYRINGE | INTRAMUSCULAR | Status: AC
  Filled 2021-07-13: qty 2

## 2021-07-13 MED ORDER — NOREPINEPHRINE 4 MG/250ML-% IV SOLN
2.0000 ug/min | INTRAVENOUS | Status: DC
  Administered 2021-07-13: 2 ug/min via INTRAVENOUS
  Administered 2021-07-13 – 2021-07-15 (×2): 4 ug/min via INTRAVENOUS
  Filled 2021-07-13 (×5): qty 250

## 2021-07-13 MED ORDER — PROSOURCE TF PO LIQD
45.0000 mL | Freq: Two times a day (BID) | ORAL | Status: DC
  Administered 2021-07-13 – 2021-07-14 (×3): 45 mL
  Filled 2021-07-13 (×3): qty 45

## 2021-07-13 MED ORDER — FENTANYL BOLUS VIA INFUSION
25.0000 ug | INTRAVENOUS | Status: DC | PRN
  Administered 2021-07-13 (×3): 100 ug via INTRAVENOUS
  Administered 2021-07-13: 50 ug via INTRAVENOUS
  Administered 2021-07-14 – 2021-07-17 (×13): 100 ug via INTRAVENOUS
  Administered 2021-07-18: 25 ug via INTRAVENOUS
  Administered 2021-07-18: 50 ug via INTRAVENOUS
  Administered 2021-07-18: 25 ug via INTRAVENOUS
  Administered 2021-07-19: 50 ug via INTRAVENOUS
  Administered 2021-07-19: 25 ug via INTRAVENOUS
  Administered 2021-07-19: 50 ug via INTRAVENOUS
  Filled 2021-07-13: qty 100

## 2021-07-13 MED ORDER — SODIUM CHLORIDE 0.9 % IV SOLN
250.0000 mL | INTRAVENOUS | Status: DC
  Administered 2021-07-17 – 2021-07-18 (×2): 250 mL via INTRAVENOUS

## 2021-07-13 MED ORDER — BUTALBITAL-APAP-CAFFEINE 50-325-40 MG PO TABS: 1.0000 | ORAL_TABLET | Freq: Two times a day (BID) | ORAL | Status: DC | PRN

## 2021-07-13 MED ORDER — FENTANYL CITRATE (PF) 100 MCG/2ML IJ SOLN
INTRAMUSCULAR | Status: AC
  Filled 2021-07-13: qty 2

## 2021-07-13 MED ORDER — FENTANYL 2500MCG IN NS 250ML (10MCG/ML) PREMIX INFUSION
25.0000 ug/h | INTRAVENOUS | Status: DC
  Administered 2021-07-13: 25 ug/h via INTRAVENOUS
  Administered 2021-07-14: 200 ug/h via INTRAVENOUS
  Administered 2021-07-14: 150 ug/h via INTRAVENOUS
  Administered 2021-07-15: 200 ug/h via INTRAVENOUS
  Filled 2021-07-13 (×4): qty 250

## 2021-07-13 MED ORDER — CLOPIDOGREL BISULFATE 75 MG PO TABS
75.0000 mg | ORAL_TABLET | Freq: Every day | ORAL | Status: DC
  Administered 2021-07-13 – 2021-07-15 (×3): 75 mg
  Filled 2021-07-13 (×3): qty 1

## 2021-07-13 MED ORDER — KETAMINE HCL 50 MG/5ML IJ SOSY
PREFILLED_SYRINGE | INTRAMUSCULAR | Status: AC
  Filled 2021-07-13: qty 5

## 2021-07-13 MED ORDER — MIDAZOLAM HCL 2 MG/2ML IJ SOLN
INTRAMUSCULAR | Status: AC
  Filled 2021-07-13: qty 2

## 2021-07-13 MED ORDER — HYDROCODONE BIT-HOMATROP MBR 5-1.5 MG/5ML PO SOLN: 5.0000 mL | ORAL | Status: DC | PRN

## 2021-07-13 MED ORDER — MIDAZOLAM HCL 2 MG/2ML IJ SOLN: 1.0000 mg | INTRAMUSCULAR | Status: DC | PRN

## 2021-07-13 MED ORDER — ORAL CARE MOUTH RINSE
15.0000 mL | OROMUCOSAL | Status: DC
  Administered 2021-07-13 – 2021-07-15 (×22): 15 mL via OROMUCOSAL

## 2021-07-13 MED ORDER — ROCURONIUM BROMIDE 10 MG/ML (PF) SYRINGE
PREFILLED_SYRINGE | INTRAVENOUS | Status: AC
  Filled 2021-07-13: qty 10

## 2021-07-13 MED ORDER — MIDAZOLAM HCL 2 MG/2ML IJ SOLN
1.0000 mg | INTRAMUSCULAR | Status: DC | PRN
  Administered 2021-07-13: 1 mg via INTRAVENOUS
  Filled 2021-07-13 (×2): qty 2

## 2021-07-13 MED ORDER — VITAL HIGH PROTEIN PO LIQD: 1000.0000 mL | ORAL | Status: DC

## 2021-07-13 MED ORDER — CHLORHEXIDINE GLUCONATE 0.12% ORAL RINSE (MEDLINE KIT)
15.0000 mL | Freq: Two times a day (BID) | OROMUCOSAL | Status: DC
  Administered 2021-07-13 – 2021-07-15 (×5): 15 mL via OROMUCOSAL

## 2021-07-13 MED ORDER — SUCCINYLCHOLINE CHLORIDE 200 MG/10ML IV SOSY
PREFILLED_SYRINGE | INTRAVENOUS | Status: AC
  Administered 2021-07-13: 100 mg
  Filled 2021-07-13: qty 10

## 2021-07-13 MED ORDER — FENTANYL CITRATE PF 50 MCG/ML IJ SOSY
25.0000 ug | PREFILLED_SYRINGE | Freq: Once | INTRAMUSCULAR | Status: AC
  Administered 2021-07-13: 25 ug via INTRAVENOUS
  Filled 2021-07-13: qty 1

## 2021-07-13 MED ORDER — DOCUSATE SODIUM 50 MG/5ML PO LIQD
100.0000 mg | Freq: Two times a day (BID) | ORAL | Status: DC
  Administered 2021-07-14 – 2021-07-18 (×3): 100 mg
  Filled 2021-07-13 (×6): qty 10

## 2021-07-13 MED ORDER — POLYETHYLENE GLYCOL 3350 17 G PO PACK
17.0000 g | PACK | Freq: Every day | ORAL | Status: DC
  Administered 2021-07-14 – 2021-07-15 (×2): 17 g
  Filled 2021-07-13 (×3): qty 1

## 2021-07-13 MED ORDER — ACETAMINOPHEN 500 MG PO TABS
1000.0000 mg | ORAL_TABLET | Freq: Four times a day (QID) | ORAL | Status: DC
  Administered 2021-07-13 – 2021-07-16 (×10): 1000 mg
  Filled 2021-07-13 (×9): qty 2

## 2021-07-13 MED FILL — Pantoprazole Sodium For IV Soln 40 MG (Base Equiv): INTRAVENOUS | Qty: 100 | Status: CN

## 2021-07-13 MED FILL — Pantoprazole Sodium For IV Soln 40 MG (Base Equiv): INTRAVENOUS | Qty: 100 | Status: AC

## 2021-07-13 NOTE — Consult Note (Signed)
[facility]   Patient name: Cynthia [name] Admit date: 07/01/2021  DOB: Jan 25, 1951 LOS: 5  MRN: [id number] Consult date: 07/12/2021  Referring provider: Dr. Carles Collet, [facility]: dyspnea    History:  71 yo female smoker presented from home to [facility] ER with 2 days of dyspnea, headache.  She also had nausea, vomiting, and diarrhea.  She noted progressive weakness.  SpO2 89% in ER on room air and fever with Tm 102.37F.  She has history of COPD.  Started on therapy for sepsis from community acquired pneumonia.  Needed Bipap and high flow oxygen.  PCCM consulted to assist with respiratory management.  Past medical history:  GERD, HLD, Migraine headaches, Anemia, Anxiety, OA, Depression, HCV, HTN, PTSD, Cocaine abuse in remission, Chronic pain  Significant events:  12/30 Admit 12/31 start intermittent Bipap use, start precedex 01/01 GI consult for melena 01/03 increased dyspnea/hypoxia; start solumedrol; altered mental status with MRI showing multifocal strokes 01/04 intubated, start peripheral pressors  Studies:  CT angio chest 07/09/21 >> diffuse b/l GGO Echo 07/10/21 >> EF 60 to 65%, mild LVH, grade 1 DD MR Brain 07/12/21 >> multiple acute/subacute infarcts b/l cerebral and cerebellar hemispheres concerning for embolic source  Micro:  Blood 12/30 >> Pneumococcal Ag 12/30 >> negative Legionella Ag 12/30 >> negative COVID/flu PCR 12/30 >> negative Blood 1/01 >>   Lines:  ETT 1/04 >>    Antibiotics:  Rocephin 12/30 Zithromax 12/30 Zosyn 12/31 >> Doxycycline 12/31 >>   Consults:  GI  Neurology   Interim history:  Noted to have aphasia and altered mental status yesterday.  MRI brain showed acute/subacute strokes.  Developed bradycardia with precedex.  Increased respiratory distress this morning and required intubation.  Vital signs:  BP (!) 112/49    Pulse 95    Temp (!) 95.6 F (35.3 C) (Rectal) Comment: nurse in room   Resp  18    Ht 5\' 3"  (1.6 m)    Wt 58.5 kg    SpO2 100%    BMI 22.85 kg/m   Intake/output:  I/O last 3 completed shifts: In: 3350.2 [P.O.:100; I.V.:1284.9; IV Piggyback:1965.3] Out: 1750 [Urine:1750]   Physical exam:   General - sedated Eyes - pupils reactive ENT - ETT in place Cardiac - regular rate/rhythm, no murmur Chest - scattered rhonchi Abdomen - soft, non tender, + bowel sounds Extremities - no cyanosis, clubbing, or edema Skin - no rashes Neuro - RASS -1  Best practice:   DVT - SCDs SUP - Protonix Nutrition - tube feeds   Assessment/plan:   Acute hypoxic respiratory failure with diffuse b/l ground glass infiltrates on CT chest imaging. - hasn't improved with course of antibiotics or diuresis - she had similar episode in 2014 and responded at that time to course of solumedrol - continue solumedrol 40 mg q6h for now - day 6 of ABx, currently on doxycycline and zosyn - goal SpO2 > 90% - f/u CXR  COPD with continued tobacco abuse. 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 - neurology consulted by primary team - f/u carotid doppler - started on ASA, plavix  Hypotension from sedation and hypovolemia. - maintain even fluid balance - peripheral levophed to keep MAP > 65  Melena, diarrhea. - GI consulted - hemoglobin has been stable - deferring endoscopy until respiratory status improved  Acute blood loss anemia from GI bleeding NOS. - f/u CBC - transfuse for Hb < 7  Hx of  HTN, HLD, Chronic pain. - per primary team  Resolved hospital problems:    Goals of care/Family discussions:  Code status: Full  Labs:   CMP Latest Ref Rng & Units 07/13/2021 07/12/2021 07/11/2021  Glucose 70 - 99 mg/dL 140(H) 88 109(H)  BUN 8 - 23 mg/dL 30(H) 24(H) 21  Creatinine 0.44 - 1.00 mg/dL 1.10(H) 1.25(H) 1.10(H)  Sodium 135 - 145 mmol/L 148(H) 144 141  Potassium 3.5 - 5.1 mmol/L 3.6 3.4(L) 3.2(L)  Chloride 98 - 111 mmol/L  114(H) 108 109  CO2 22 - 32 mmol/L 23 25 21(L)  Calcium 8.9 - 10.3 mg/dL 8.4(L) 8.3(L) 8.2(L)  Total Protein 6.5 - 8.1 g/dL - - 7.0  Total Bilirubin 0.3 - 1.2 mg/dL - - 0.6  Alkaline Phos 38 - 126 U/L - - 68  AST 15 - 41 U/L - - 77(H)  ALT 0 - 44 U/L - - 29    CBC Latest Ref Rng & Units 07/13/2021 07/12/2021 07/11/2021  WBC 4.0 - 10.5 K/uL 9.7 11.1(H) 11.0(H)  Hemoglobin 12.0 - 15.0 g/dL 9.4(L) 8.8(L) 8.7(L)  Hematocrit 36.0 - 46.0 % 29.1(L) 27.1(L) 26.0(L)  Platelets 150 - 400 K/uL 183 198 188    ABG    Component Value Date/Time   PHART 7.274 (L) 07/13/2021 1318   PCO2ART 41.2 07/13/2021 1318   PO2ART 188 (H) 07/13/2021 1318   HCO3 18.4 (L) 07/13/2021 1318   TCO2 30 01/17/2013 0436   ACIDBASEDEF 7.1 (H) 07/13/2021 1318   O2SAT 98.3 07/13/2021 1318    CBG (last 3)  No results for input(s): GLUCAP in the last 72 hours.   Critical care time: 34 minutes  Chesley Mires, MD [place] Pager - [phone] 07/13/2021, 1:47 PM

## 2021-07-13 NOTE — Progress Notes (Signed)
°  Transition of Care [facility]) Screening Note   Patient Details  Name: Cynthia [name] Date of Birth: Aug 17, 1950   Transition of Care [facility]) CM/SW Contact:    Boneta Lucks, RN Phone Number: 07/13/2021, 3:42 PM  Palliative and PT eval pending.  Transition of Care Department [facility]) has reviewed patient and no TOC needs have been identified at this time. We will continue to monitor patient advancement through interdisciplinary progression rounds. If new patient transition needs arise, please place a TOC consult.

## 2021-07-13 NOTE — Progress Notes (Signed)
On assessment at 1030 patient is tachycardic with heart rate sustaining high 120s, BP 174/87, respirations labored and diaphoretic. PRN morphine given at 1033 with no changes. Dr. Wynetta Emery and Dr. Halford Chessman notified 1045.

## 2021-07-13 NOTE — Progress Notes (Signed)
Upon initial assessment of patient she is agitated and moving around in bed. Patient continues to point at chest and when asked if she feels like she cannot breathe patient nods her head yes. She complains of no pain. Patient's respirations are labored with accessory muscle use. Patient is moaning with jaw clenched. O2 saturation is 96% on heated high flow 20 liters, 90%. Patient previously has been on precedex gtt for agitation, but was stopped by nightshift RN as patient became bradycardic. On assessment HR is 73,BP is 140/61. Precedex restarted by this RN at [phone]. Dr. Wynetta Emery notified at 0800. Patient calmed after precedex gtt restarted, however shortly after became bradycardic and gtt was stopped at 0823. Dr. Wynetta Emery to bedside at approximately 0840 to assess patient. No new orders at this time.

## 2021-07-13 NOTE — Consult Note (Signed)
NEUROLOGY CONSULTATION NOTE   Date of service: July 13, 2021 Patient Name: Cynthia [name] MRN:  [id number] DOB:  11/28/1950 Reason for consult: "embolic strokes on MRI Brain" Requesting Provider: Maryjane Hurter, MD _ _ _   _ __   _ __ _ _  __ __   _ __   __ _  History of Present Illness  Cynthia [name] is a 71 y.o. female with PMH significant for COPD, HTN, Depression, GERD, HTN, migraines, hx of tobacco use who was admitted to [facility] with SOB with exertion along with nausea and vomiting.  She was found to have Pneumonia, acute GI bleed. She developed encephalopathy during admission and an MRI Brain demonstrated multple embolic appearing strokes. She was intubated for worsening SOB and mentation.  She was transferred to [facility] for further evaluation and management.   She is intubated on my evaluation with poor mentation and unable to provide any history.  mRS: 0 tNKAse/thrombectomy: outside window and unclear symptoms. Strokes appear to be an incidental finding in the workup of her encephalopathy. NIHSS components Score: Comment  1a Level of Conscious 0[]  1[]  2[x]  3[]      1b LOC Questions 0[]  1[]  2[x]       1c LOC Commands 0[x]  1[]  2[]       2 Best Gaze 0[x]  1[]  2[]       3 Visual 0[x]  1[]  2[]  3[]      4 Facial Palsy 0[x]  1[]  2[]  3[]      5a Motor Arm - left 0[]  1[x]  2[]  3[]  4[]  UN[]    5b Motor Arm - Right 0[]  1[x]  2[]  3[]  4[]  UN[]    6a Motor Leg - Left 0[]  1[x]  2[]  3[]  4[]  UN[]    6b Motor Leg - Right 0[]  1[x]  2[]  3[]  4[]  UN[]    7 Limb Ataxia 0[x]  1[]  2[]  3[]  UN[]     8 Sensory 0[x]  1[]  2[]  UN[]      9 Best Language 0[x]  1[]  2[]  3[]      10 Dysarthria 0[]  1[]  2[]  UN[x]      11 Extinct. and Inattention 0[x]  1[]  2[]       TOTAL: 8      ROS   Unable to obtain due to intubation and sedation.  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 the examined terminal ileum appeared to be normal 2. the colonic mucosa appeared normal 3. small internal hemorrhoids   ESOPHAGOGASTRODUODENOSCOPY (EGD) WITH PROPOFOL N/A 02/09/2015   SLF: 1. stricture at the gastroesophageal junction 2. mild non-erosive gastritis    ESOPHAGOGASTRODUODENOSCOPY (EGD) WITH  PROPOFOL N/A 02/24/2020   Procedure: ESOPHAGOGASTRODUODENOSCOPY (EGD) WITH PROPOFOL;  Surgeon: Cynthia Harman, DO;  Gastritis s/p biopsy (nonspecific reactive gastropathy), normal examined duodenum s/p biopsy (benign).   GIVENS CAPSULE STUDY N/A 10/19/2016   Procedure: GIVENS CAPSULE STUDY;  Surgeon: Cynthia Binder, MD;  Location: [facility] SUITE;  Service: Endoscopy;  Laterality: N/A;  7:30am, pt to arrive at 7:00am   left arm     ? nerve repaired, " it was pinched".   left elbow surgery Left    SAVORY DILATION N/A 02/09/2015   Procedure: SAVORY DILATION 12.60mm, 66mm, 21mm, 72mm;  Surgeon: Cynthia Binder, MD;  Location: [facility] ORS;  Service: Endoscopy;  Laterality: N/A;   TONSILLECTOMY     TUBAL LIGATION     Family History  Problem Relation Age of Onset   Heart failure Father    Hypertension Father    Stroke Father    Colon cancer Father        greater than age 31   Cancer Father    Dementia Father    Cancer Mother     Seizures Sister    Inflammatory bowel disease Neg Hx    Liver disease Neg Hx    Social History   Socioeconomic History   Marital status: Legally Separated    Spouse name: Cynthia [name]   Number of children: 1   Years of education: Not on file   Highest education level: Not on file  Occupational History   Occupation: disability    Comment: lung disease / COPD  Tobacco Use   Smoking status: Every Day    Packs/day: 0.25    Years: 42.00    Pack years: 10.50    Types: Cigarettes    Start date: 09/29/1972   Smokeless tobacco: Never   Tobacco comments:    about 2 cigs per day  Vaping Use   Vaping Use: Never used  Substance and Sexual Activity   Alcohol use: No    Alcohol/week: 0.0 standard drinks   Drug use: Not Currently    Types: "Crack" cocaine, Marijuana    Comment: hx of smoking crack cocaine last 5-6 years ago; 02/16/21 occ marijuana; 06/20/21 denied   Sexual activity: Never    Birth control/protection: None  Other Topics Concern   Not on file  Social History Narrative   Married to [employer] for 28 years   Keep her sister - she has seizures/disabled   Social Determinants of Radio broadcast assistant Strain: Not on file  Food Insecurity: Not on file  Transportation Needs: Not on file  Physical Activity: Not on file  Stress: Not on file  Social Connections: Not on file   Allergies  Allergen Reactions   Asa [Aspirin] Other (See Comments)    REACTION: Stomach upset   Ibuprofen     STOMACH UPSET    Medications   Medications Prior to Admission  Medication Sig Dispense Refill Last Dose   albuterol (PROVENTIL) (2.5 MG/3ML) 0.083% nebulizer solution INHALE 1 VIAL VIA NEBULIZER EVERY 6 HOURS AS NEEDED FOR WHEEZING AND SHORTNESS OF BREATH 150 mL 1 Past Week   albuterol (VENTOLIN HFA) 108 (90 Base) MCG/ACT inhaler Inhale 1 puff into the lungs every 6 (six) hours as needed for wheezing or shortness of breath.    Past Week   amLODipine (NORVASC) 10 MG tablet Take 10 mg by mouth  daily.   Past Week   Ascorbic Acid (VITAMIN C PO) Take 1 tablet by mouth daily.  unknown   atorvastatin (LIPITOR) 40 MG tablet Take 40 mg by mouth daily.    Past Week   buprenorphine (BUTRANS) 7.5 MCG/HR Place 1 patch onto the skin once a week.   Past Week   butalbital-acetaminophen-caffeine (FIORICET) 50-325-40 MG tablet Take 1 tablet by mouth 2 (two) times daily as needed.   unknown   Calcium-Magnesium-Zinc (CAL-MAG-ZINC PO) Take 1 tablet by mouth daily.    Past Week   ferrous sulfate 325 (65 FE) MG tablet Take 325 mg by mouth daily with breakfast.   Past Week   furosemide (LASIX) 20 MG tablet TAKE ONE TABLET BY mouth daily prn 90 tablet 1 Past Week   Multiple Vitamin (MULTIVITAMIN) tablet Take 1 tablet by mouth daily.   Past Week   nitroGLYCERIN (NITROSTAT) 0.4 MG SL tablet Place 0.4 mg under the tongue every 5 (five) minutes as needed for chest pain.   unknown   omeprazole (PRILOSEC) 20 MG capsule TAKE 1 CAPSULE BY MOUTH DAILY BEFORE A MEAL. (Patient taking differently: Take 20 mg by mouth as needed.) 90 capsule 3 Past Week   pregabalin (LYRICA) 75 MG capsule Take 75 mg by mouth daily.   Past Week   propranolol (INDERAL) 80 MG tablet Take 1 tablet (80 mg total) by mouth 2 (two) times daily. 180 tablet 1 Past Week at unknown   tiZANidine (ZANAFLEX) 2 MG tablet Take 1-2 tablets by mouth every 8 (eight) hours as needed.   unknown   topiramate (TOPAMAX) 25 MG tablet Take 25 mg by mouth at bedtime.   Past Week   traMADol (ULTRAM) 50 MG tablet Take 50-100 mg by mouth every 6 (six) hours as needed.   Past Week   traZODone (DESYREL) 50 MG tablet Take 50-75 mg by mouth at bedtime as needed for sleep.   Past Week     Vitals   Vitals:   07/13/21 2100 07/13/21 2115 07/13/21 2130 07/13/21 2145  BP: (!) 103/45 (!) 131/56 110/63 (!) 124/51  Pulse: (!) 57 85 65 79  Resp: 20 15 18 19   Temp:      TempSrc:      SpO2: 99% 94% 98% 98%  Weight:      Height:         Body mass index is 22.85  kg/m.  Physical Exam   General: Ill appearing, appears advanced for her age, laying in bed and intermittently coughing on ETT HENT: Normal oropharynx and mucosa. Normal external appearance of ears and nose. Neck: Supple, no pain or tenderness CV: No JVD. No peripheral edema. Pulmonary: Symmetric Chest rise. Normal respiratory effort. Breathing over vent. Abdomen: Soft to touch, non-tender. Ext: No cyanosis, edema, or deformity Skin: No rash. Normal palpation of skin.  Musculoskeletal: Normal digits and nails by inspection. No clubbing.  Neurologic Examination  Mental status/Cognition: opens eyes partially to loud voice, poor attention. Unable to assess orientation due to intubation. Speech/language: intubated, gives a thumbs up and wiggles toes on commands. Does not mouth words. Cranial nerves:   CN II Pupils equal and reactive to light, unable to assess for VF deficit.   CN III,IV,VI EOM intact, no gaze preference or deviation, no nystagmus   CN V normal sensation in V1, V2, and V3 segments bilaterally   CN VII no asymmetry, no nasolabial fold flattening, symmetric facial grimace.   CN VIII Turns head towards speech   CN IX & X Spontaneously coughs.   CN XI Unable to assess.   CN XII midline tongue,  does not protrude on command.   Motor:  Muscle bulk: poor, tone normal Unable to do detailed strength testing secondary to intubation and sedation. She squeezes my hands BL on command with about a 4/5 strength BL. She is able to lift both arms off the bed briefly. She wiggles toes BL on command. She is able to briefly hold BL legs off the bed.  Reflexes:  Right Left Comments  Pectoralis      Biceps (C5/6) 1 1   Brachioradialis (C5/6) 1 1    Triceps (C6/7) 1 1    Patellar (L3/4) 1 1    Achilles (S1)      Hoffman      Plantar     Jaw jerk    Sensation:  Light touch Regards touch in all extremities.   Pin prick    Temperature    Vibration   Proprioception     Coordination/Complex Motor:  - Finger to Nose unable to do - Heel to shin unable to do - Rapid alternating movement: unable to do - Gait: Deferred for safety given that she is intubated and sedated.  Labs   CBC:  Recent Labs  Lab 07/12/21 0359 07/13/21 0432  WBC 11.1* 9.7  HGB 8.8* 9.4*  HCT 27.1* 29.1*  MCV 84.2 84.8  PLT 198 854    Basic Metabolic Panel:  Lab Results  Component Value Date   NA 148 (H) 07/13/2021   K 3.6 07/13/2021   CO2 23 07/13/2021   GLUCOSE 140 (H) 07/13/2021   BUN 30 (H) 07/13/2021   CREATININE 1.10 (H) 07/13/2021   CALCIUM 8.4 (L) 07/13/2021   GFRNONAA 55 (L) 07/13/2021   GFRAA 61 06/11/2020   Lipid Panel:  Lab Results  Component Value Date   LDLCALC 38 07/13/2021   HgbA1c:  Lab Results  Component Value Date   HGBA1C 5.5 07/13/2021   Urine Drug Screen:     Component Value Date/Time   LABOPIA NONE DETECTED 02/09/2015 0915   COCAINSCRNUR NONE DETECTED 02/09/2015 0915   LABBENZ POSITIVE (A) 02/09/2015 0915   AMPHETMU NONE DETECTED 02/09/2015 0915   THCU NONE DETECTED 02/09/2015 0915   LABBARB NONE DETECTED 02/09/2015 0915    Alcohol Level No results found for: [facility]  CT Head without contrast(Personally reviewed): There is a new small focus of low attenuation in the left frontal cortex suggesting possible recent or old infarct in the left MCA distribution.  MR Angio head without contrast and Carotid Duplex BL(Personally reviewed): Evaluation of the intracranial vasculature is significantly limited by motion artifact. No definite large vessel occlusion is seen.  Mild (1-49%) stenosis proximal right internal carotid artery secondary to heterogenous atherosclerotic plaque.  No significant atherosclerotic plaque or evidence of stenosis in the left internal carotid artery. Vertebral arteries are patent with normal antegrade flow.  MRI Brain(Personally reviewed): 1. Multiple acute and subacute infarcts in the bilateral cerebral and  cerebellar hemispheres. The largest areas of infarction is in the posterior left frontal cortex. No evidence of hemorrhagic transformation. No mass effect or midline shift. Given multiple vascular territories, an embolic etiology is suspected. Impression   Cynthia [name] is a 71 y.o. female with PMH significant for COPD, HTN, Depression, GERD, HTN, migraines, hx of tobacco use who was admitted to [facility] with SOB with exertion along with nausea and vomiting.  She was found to have Pneumonia, acute GI bleed. She developed encephalopathy during admission and an MRI Brain demonstrated multple embolic appearing strokes.  Stroke  workup so far with no significant vessel stenosis, normal HbA1c and LDL, TTE with EF of 60-65%, No atrial level shunt detected by color flow Doppler.  Etiology of strokes: Embolic strokes of undetermined source. Differential includes possible PFO/shunt, hypercoagulable state, endocarditis.   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   Secondary Diagnosis: Essential (primary) hypertension and Possible Sepsis  Recommendations  Plan:  - Frequent Neuro checks per stroke unit protocol - Limited TTE bubble study - BL lower extremity dopplers - Will benefit from TEE if limited TTE is negative for PFO. - LDL is at goal - HbA1c is not concerning for diabetes. - Antithrombotic - Aspirin 81mg  daily. - Recommend DVT ppx - SBP goal - permissive hypertension first 24 h < 220/110. Held home meds.  - Recommend Telemetry monitoring for arrythmia - Recommend bedside swallow screen prior to PO intake. - Stroke education booklet - Recommend PT/OT/SLP consult - Stroke team to follow.  _________________________________________________________________  This patient is critically ill and at significant risk of neurological worsening, death and care requires constant monitoring of vital signs, hemodynamics,respiratory  and cardiac monitoring, neurological assessment, discussion with family, other specialists and medical decision making of high complexity. I spent 35 minutes of neurocritical care time  in the care of  this patient. This was time spent independent of any time provided by nurse practitioner or PA.  Donnetta Simpers [facility] Pager Number [phone] 07/13/2021  10:23 PM    Thank you for the opportunity to take part in the care of this patient. If you have any further questions, please contact the neurology consultation attending.  Signed,  [place] Pager Number [phone] _ _ _   _ __   _ __ _ _  __ __   _ __   __ _

## 2021-07-13 NOTE — Progress Notes (Signed)
CCM Interval Progress Note  71 yo F hx COPD admitted to [facility] with CC n/v/d weakness and tx for CAP. 1/3 MRI Brain with multiple acute / subacute infarcts, largest in posterior L frontal cortex. On 1/4 her respiratory status declined and she required intubation. She was transferred to [facility] ICU for further management and care.  Seen at bedside in this setting  P CXR, ensure ETT position satisfactory after transport Neuro consult  Cont MV, VAP, PAD AM labs    Eliseo Gum MSN, AGACNP-BC [place] 07/13/2021, 6:36 PM

## 2021-07-13 NOTE — Progress Notes (Signed)
Initial Nutrition Assessment  DOCUMENTATION CODES:      INTERVENTION:  Initiate Vital 1.2 @ 40 ml/hr via OGT (960 ml)  Add 45 ml ml ProSource TF BID per tube   Tube feeding regimen provides 1232 kcal,  94 grams of protein, and 779 ml of H2O.    Monitor magnesium, potassium, and phosphorus BID for at least 3 days, MD to replete as needed, as pt is at risk for refeeding syndrome given limited nutrition intake (day 6).   NUTRITION DIAGNOSIS:   Inadequate oral intake related to inability to eat as evidenced by NPO status.   GOAL:  Provide needs based on ASPEN/SCCM guidelines   MONITOR:  Vent status, Labs, I & O's, Weight trends  REASON FOR ASSESSMENT:   Ventilator, Low Braden    ASSESSMENT: Patient is a 71 yo female with COPD, GERD, HTN, CHF and PTSD. She presented 4 days ago from home with shortness of breath, N/V/D for 24-48 hr prior to admission. Sepsis related to CAP.   Patient poor tolerance of intermittent BiPAP, precedex (now stopped).  1/2 HFNC @ 15L.   1/3 MR brain suggestive of embolic stroke per MD  1/4 Patient is currently intubated on ventilator support due to decline in respiratory and mental status per MD. NGT removed and OGT placed with intubation. Pressor support.  Talked with nursing and pt discussed during morning rounds.   MV: 11.0 L/min. MAP-80 Temp (24hrs), Avg:97.1 F (36.2 C), Min:95.6 F (35.3 C), Max:97.9 F (36.6 C)  Sedation- Fentanyl: 5 ml/hr   Patient weights reviewed. 58.5 kg currently and wt 12/12- 63.3 kg.  Medications reviewed and include: colace, prednisone, miralax, lipitor. Levophed.   Labs: FOBT- positive (endo-deferred due to respiratory status). C-diff negative BMP Latest Ref Rng & Units 07/13/2021 07/12/2021 07/11/2021  Glucose 70 - 99 mg/dL 140(H) 88 109(H)  BUN 8 - 23 mg/dL 30(H) 24(H) 21  Creatinine 0.44 - 1.00 mg/dL 1.10(H) 1.25(H) 1.10(H)  BUN/Creat Ratio 6 - 22 (calc) - - -  Sodium 135 - 145 mmol/L 148(H) 144 141   Potassium 3.5 - 5.1 mmol/L 3.6 3.4(L) 3.2(L)  Chloride 98 - 111 mmol/L 114(H) 108 109  CO2 22 - 32 mmol/L 23 25 21(L)  Calcium 8.9 - 10.3 mg/dL 8.4(L) 8.3(L) 8.2(L)     NUTRITION - FOCUSED PHYSICAL EXAM:  Unable to complete Nutrition-Focused physical exam at this time.     Diet Order:   Diet Order             Diet NPO time specified  Diet effective midnight                   EDUCATION NEEDS:  Not appropriate for education at this time  Skin:  Skin Assessment: Reviewed RN Assessment  Last BM:  1/4 diarrhea  Height:   Ht Readings from Last 1 Encounters:  07/03/2021 5\' 3"  (1.6 m)    Weight:   Wt Readings from Last 1 Encounters:  07/13/21 58.5 kg    Ideal Body Weight:   52 kg  BMI:  Body mass index is 22.85 kg/m.  Estimated Nutritional Needs:   Kcal:  1353  Protein:  88-94 gr  Fluid:  >1300 ml  Colman Cater MS,RD,CSG,LDN Contact: AMION

## 2021-07-13 NOTE — Progress Notes (Signed)
PROGRESS NOTE  Cynthia [name] IEP:[id number] DOB: 03-Sep-1950 DOA: 06/27/2021 PCP: Celene Squibb, MD   Brief History:  71 year old female with a history of tobacco abuse, COPD, hypertension, hyperlipidemia, depression presenting with 2-day history of shortness of breath with associated nausea, vomiting, and diarrhea.  The patient also has subjective fevers and chills at home.  History is limited due to the patient's extremis.  Upon EMS arrival, she was noted to have oxygen saturation in the 80% range on room air.  Initial work-up showed a fever of 102.4 F, but she was hemodynamically stable.  She was placed on high flow nasal cannula up to 7 L.  WBC 16.7, hemoglobin 1.3, platelets 239,000.  Sodium 139, potassium 2.9, bicarbonate 17, BUN 12, creatinine 0.91.  The patient was initially started on ceftriaxone and azithromycin.  Apparently, the patient has noted some melanotic stool.  FOBT was positive.   Assessment/Plan: Sepsis -Present on admission -Secondary to pneumonia -Presented with fever, leukocytosis, and respiratory failure -Lactic acid peaked 1.7 -PCT 0.44>>2.62 -follow blood culture--neg to date -UA--no pyuria -no fever in 24 hours -repeat blood cultures neg to date -repeat UA no pyuria   Lobar pneumonia/Aspiration Pneumonia -CTA chest negative for PE, pulmonary artery with mild enlargement; bilateral GGO -Suspect component of aspiration pneumonitis given her recurrent vomiting -Continue Zosyn -MRSA screen--neg -continue doxy   Acute respiratory failure with hypoxia -pt requiring BiPAP>>weaned to 15L HF -wean as tolerated for saturation >90% -due to pneumonia in setting of underlying COPD -COVID neg -12/31 evening--resp distress>>CCM started low dose precedex -1/1--personally reviewed CXR>>pulmonary edema -1/1 and 1/2-->lasix IV x 1 -1/1 Echo EF 60-65%, no WMA, G1DD -1/2--personally reviewed CXR--increased interstitial markings -07/12/21--pulmonary  consult>>discussed with Dr. Halford Chessman increased solumedrol to 40 mg Q6 hour -07/13/21 - Pt intubated and transfer to [facility] - discussed with Dr. Halford Chessman.    Acute metabolic Encephalopathy -12/13/04 nights>>maxed on precedex and given extra morphine -1/3/223>>wean precedex to 0.5; d/c morphine -07/12/21 CT brain>>new small focus of low attenuation in the left frontal cortex  -1/323 ordered MR brain -07/12/21 start empiric ASA -07/13/21 - intubated on fentanyl infusion    Essential hypertension -Holding antihypertensive agents at this time and monitor clinically   Hypokalemia/Hypomagnesemia -Repleted   Diarrhea -cdiff neg -GI pathogen panel--neg   Hyperlipidemia -continue statin   FOBT positive -not stable presently for endoscopic eval -continue protonix -SCDs   Chronic Pain syndrome -she is on buprenorphine patch 7.5 mcg/hr and lyrica     Family Communication:  daughter updated telephone 07/13/21 Consultants:  none   Code Status:  FULL    DVT Prophylaxis:  SCDs     Procedures: As Listed in Progress Note Above   Antibiotics: Zosyn 12/31>> Doxy 12/31>>   Subjective: Pt having increasing respiratory distress, gasping for air   Objective: Vitals:   07/13/21 1300 07/13/21 1330 07/13/21 1400 07/13/21 1415  BP: (!) 66/37 (!) 112/49 (!) 85/43 (!) 111/47  Pulse:      Resp: (!) 23 18 (!) 21 20  Temp:      TempSrc:      SpO2:      Weight:      Height:        Intake/Output Summary (Last 24 hours) at 07/13/2021 1429 Last data filed at 07/13/2021 1332 Gross per 24 hour  Intake 1268.76 ml  Output --  Net 1268.76 ml   Weight change: -1.2 kg Exam:  General:  Pt is alert, follows commands  appropriately, not in acute distress HEENT: No icterus, No thrush, No neck mass, [place]/AT Cardiovascular: RRR, S1/S2, no rubs, no gallops Respiratory: bilateral rales. No wheeze Abdomen: Soft/+BS, non tender, non distended, no guarding Extremities: No edema, No lymphangitis, No petechiae, No rashes,  no synovitis   Data Reviewed: I have personally reviewed following labs and imaging studies Basic Metabolic Panel: Recent Labs  Lab 07/09/21 0534 07/09/21 2149 07/10/21 0428 07/11/21 0621 07/12/21 0359 07/13/21 0432  NA 139  --  139 141 144 148*  K 2.9* 2.9* 3.9 3.2* 3.4* 3.6  CL 110  --  111 109 108 114*  CO2 17*  --  18* 21* 25 23  GLUCOSE 137*  --  111* 109* 88 140*  BUN 12  --  10 21 24* 30*  CREATININE 0.91  --  0.98 1.10* 1.25* 1.10*  CALCIUM 8.4*  --  8.1* 8.2* 8.3* 8.4*  MG 1.7 1.5* 2.2 1.8 2.3  --   PHOS 2.5  --   --   --   --   --    Liver Function Tests: Recent Labs  Lab 07/06/2021 2116 07/09/21 0534 07/10/21 0428 07/11/21 0621  AST 64* 63* 70* 77*  ALT _0 ALKPHOS 62 57 61 68  BILITOT 1.0 0.5 0.6 0.6  PROT 8.7* 7.4 6.7 7.0  ALBUMIN 3.3* 2.8* 2.5* 2.4*   No results for input(s): LIPASE, AMYLASE in the last 168 hours. No results for input(s): AMMONIA in the last 168 hours. Coagulation Profile: Recent Labs  Lab 06/16/2021 2116  INR 1.2   CBC: Recent Labs  Lab 07/09/21 0534 07/10/21 0428 07/11/21 0621 07/12/21 0359 07/13/21 0432  WBC 19.2* 15.2* 11.0* 11.1* 9.7  HGB 9.9* 8.2* 8.7* 8.8* 9.4*  HCT 30.5* 25.4* 26.0* 27.1* 29.1*  MCV 85.7 86.4 85.2 84.2 84.8  PLT 198 161 188 198 183   Cardiac Enzymes: No results for input(s): CKTOTAL, CKMB, CKMBINDEX, TROPONINI in the last 168 hours. BNP: Invalid input(s): POCBNP CBG: Recent Labs  Lab 06/25/2021 2122 07/10/21 0745  GLUCAP 144* 116*   HbA1C: Recent Labs    07/13/21 0432  HGBA1C 5.5   Urine analysis:    Component Value Date/Time   COLORURINE YELLOW 07/11/2021 1511   APPEARANCEUR CLEAR 07/11/2021 1511   LABSPEC 1.015 07/11/2021 1511   PHURINE 5.5 07/11/2021 1511   GLUCOSEU NEGATIVE 07/11/2021 1511   HGBUR TRACE (A) 07/11/2021 1511   HGBUR moderate 11/11/2008 0921   BILIRUBINUR NEGATIVE 07/11/2021 1511   KETONESUR NEGATIVE 07/11/2021 1511   PROTEINUR NEGATIVE 07/11/2021 1511    UROBILINOGEN 0.2 11/11/2008 0921   NITRITE NEGATIVE 07/11/2021 1511   LEUKOCYTESUR NEGATIVE 07/11/2021 1511    Recent Results (from the past 240 hour(s))  Urine Culture     Status: None   Collection Time: 06/10/2021  9:16 PM   Specimen: In/Out Cath Urine  Result Value Ref Range Status   Specimen Description   Final    IN/OUT CATH URINE Performed at [facility], [address], [place], [place] [address]    Special Requests   Final    NONE Performed at [facility], [address] [place], [place] [address]    Culture   Final    NO GROWTH Performed at [facility], [place] [address] [place], [place] [address]    Report Status 07/10/2021 FINAL  Final  Blood Culture (routine x 2)     Status: None   Collection Time: 06/15/2021  9:19 PM  Specimen: Right Antecubital; Blood  Result Value Ref Range Status   Specimen Description   Final    RIGHT ANTECUBITAL BOTTLES DRAWN AEROBIC AND ANAEROBIC   Special Requests Blood Culture adequate volume  Final   Culture   Final    NO GROWTH 5 DAYS Performed at [facility], [address] [place], [place] [address]    Report Status 07/13/2021 FINAL  Final  Blood Culture (routine x 2)     Status: None   Collection Time: 06/09/2021  9:19 PM   Specimen: Left Antecubital; Blood  Result Value Ref Range Status   Specimen Description   Final    LEFT ANTECUBITAL BOTTLES DRAWN AEROBIC AND ANAEROBIC   Special Requests Blood Culture adequate volume  Final   Culture   Final    NO GROWTH 5 DAYS Performed at [facility], [address] [place], [place] [address]    Report Status 07/13/2021 FINAL  Final  Resp Panel by RT-PCR (Flu A&B, Covid) Nasopharyngeal Swab     Status: None   Collection Time: 06/25/2021  9:23 PM   Specimen: Nasopharyngeal Swab; Nasopharyngeal(NP) swabs in vial transport medium  Result Value Ref Range Status   SARS Coronavirus 2 by RT PCR NEGATIVE NEGATIVE Final    Comment: (NOTE) SARS-CoV-2 target nucleic acids are NOT  DETECTED.  The SARS-CoV-2 RNA is generally detectable in upper respiratory specimens during the acute phase of infection. The lowest concentration of SARS-CoV-2 viral copies this assay can detect is 138 copies/mL. A negative result does not preclude SARS-Cov-2 infection and should not be used as the sole basis for treatment or other patient management decisions. A negative result may occur with  improper specimen collection/handling, submission of specimen other than nasopharyngeal swab, presence of viral mutation(s) within the areas targeted by this assay, and inadequate number of viral copies(<138 copies/mL). A negative result must be combined with clinical observations, patient history, and epidemiological information. The expected result is Negative.  Fact Sheet for Patients:  EntrepreneurPulse.com.au  Fact Sheet for Healthcare Providers:  IncredibleEmployment.be  This test is no t yet approved or cleared by the [place] FDA and  has been authorized for detection and/or diagnosis of SARS-CoV-2 by FDA under an Emergency Use Authorization (EUA). This EUA will remain  in effect (meaning this test can be used) for the duration of the COVID-19 declaration under Section 564(b)(1) of the Act, 21 U.S.C.section 360bbb-3(b)(1), unless the authorization is terminated  or revoked sooner.       Influenza A by PCR NEGATIVE NEGATIVE Final   Influenza B by PCR NEGATIVE NEGATIVE Final    Comment: (NOTE) The Xpert Xpress SARS-CoV-2/FLU/RSV plus assay is intended as an aid in the diagnosis of influenza from Nasopharyngeal swab specimens and should not be used as a sole basis for treatment. Nasal washings and aspirates are unacceptable for Xpert Xpress SARS-CoV-2/FLU/RSV testing.  Fact Sheet for Patients: EntrepreneurPulse.com.au  Fact Sheet for Healthcare Providers: IncredibleEmployment.be  This test is not yet  approved or cleared by the [place] FDA and has been authorized for detection and/or diagnosis of SARS-CoV-2 by FDA under an Emergency Use Authorization (EUA). This EUA will remain in effect (meaning this test can be used) for the duration of the COVID-19 declaration under Section 564(b)(1) of the Act, 21 U.S.C. section 360bbb-3(b)(1), unless the authorization is terminated or revoked.  Performed at [facility], [address] [place], [place] [address]   C Difficile Quick Screen w PCR reflex     Status:  None   Collection Time: 07/09/21 12:14 AM   Specimen: Stool  Result Value Ref Range Status   C Diff antigen NEGATIVE NEGATIVE Final   C Diff toxin NEGATIVE NEGATIVE Final   C Diff interpretation No C. difficile detected.  Final    Comment: Performed at [facility], [address], [place], [place] [address]  Gastrointestinal Panel by PCR , Stool     Status: None   Collection Time: 07/09/21 12:14 AM   Specimen: Stool  Result Value Ref Range Status   Campylobacter species NOT DETECTED NOT DETECTED Final   Plesimonas shigelloides NOT DETECTED NOT DETECTED Final   Salmonella species NOT DETECTED NOT DETECTED Final   Yersinia enterocolitica NOT DETECTED NOT DETECTED Final   Vibrio species NOT DETECTED NOT DETECTED Final   Vibrio cholerae NOT DETECTED NOT DETECTED Final   Enteroaggregative E coli (EAEC) NOT DETECTED NOT DETECTED Final   Enteropathogenic E coli (EPEC) NOT DETECTED NOT DETECTED Final   Enterotoxigenic E coli (ETEC) NOT DETECTED NOT DETECTED Final   Shiga like toxin producing E coli (STEC) NOT DETECTED NOT DETECTED Final   Shigella/Enteroinvasive E coli (EIEC) NOT DETECTED NOT DETECTED Final   Cryptosporidium NOT DETECTED NOT DETECTED Final   Cyclospora cayetanensis NOT DETECTED NOT DETECTED Final   Entamoeba histolytica NOT DETECTED NOT DETECTED Final   Giardia lamblia NOT DETECTED NOT DETECTED Final   Adenovirus F40/41 NOT DETECTED NOT DETECTED Final   Astrovirus  NOT DETECTED NOT DETECTED Final   Norovirus GI/GII NOT DETECTED NOT DETECTED Final   Rotavirus A NOT DETECTED NOT DETECTED Final   Sapovirus (I, II, IV, and V) NOT DETECTED NOT DETECTED Final    Comment: Performed at [facility], [address] [place], [place] [address]  MRSA Next Gen by PCR, Nasal     Status: None   Collection Time: 07/09/21  3:13 AM   Specimen: Nasal Mucosa; Nasal Swab  Result Value Ref Range Status   MRSA by PCR Next Gen NOT DETECTED NOT DETECTED Final    Comment: (NOTE) The GeneXpert MRSA Assay (FDA approved for NASAL specimens only), is one component of a comprehensive MRSA colonization surveillance program. It is not intended to diagnose MRSA infection nor to guide or monitor treatment for MRSA infections. Test performance is not FDA approved in patients less than 54 years old. Performed at [facility], [address] [place], [place] [address]   Culture, blood (routine x 2)     Status: None (Preliminary result)   Collection Time: 07/10/21  6:02 PM   Specimen: BLOOD RIGHT HAND  Result Value Ref Range Status   Specimen Description   Final    BLOOD RIGHT HAND BOTTLES DRAWN AEROBIC AND ANAEROBIC   Special Requests   Final    Blood Culture results may not be optimal due to an excessive volume of blood received in culture bottles   Culture   Final    NO GROWTH 3 DAYS Performed at [facility], [address] [place], [place] [address]    Report Status PENDING  Incomplete  Culture, blood (routine x 2)     Status: None (Preliminary result)   Collection Time: 07/10/21  6:02 PM   Specimen: BLOOD LEFT HAND  Result Value Ref Range Status   Specimen Description   Final    BLOOD LEFT HAND BOTTLES DRAWN AEROBIC AND ANAEROBIC   Special Requests   Final    Blood Culture results may not be optimal due to an excessive  volume of blood received in culture bottles   Culture   Final    NO GROWTH 3 DAYS Performed at [facility], [address], [place],  [place] [address]    Report Status PENDING  Incomplete  Urine Culture     Status: Abnormal   Collection Time: 07/11/21  3:11 PM   Specimen: Urine, Clean Catch  Result Value Ref Range Status   Specimen Description   Final    URINE, CLEAN CATCH Performed at [facility], [address] [place], [place] [address]    Special Requests   Final    NONE Performed at [facility], [address] [place], [place] [address]    Culture (A)  Final    <10,000 COLONIES/mL INSIGNIFICANT GROWTH Performed at [facility] [address] [place],  [address]    Report Status 07/13/2021 FINAL  Final     Scheduled Meds:  acetaminophen  1,000 mg Per Tube Q6H   arformoterol  15 mcg Nebulization BID   aspirin  81 mg Per Tube Daily   atorvastatin  40 mg Per Tube Daily   budesonide (PULMICORT) nebulizer solution  0.5 mg Nebulization BID   chlorhexidine gluconate (MEDLINE KIT)  15 mL Mouth Rinse BID   Chlorhexidine Gluconate Cloth  6 each Topical Q0600   clopidogrel  75 mg Per Tube Daily   docusate  100 mg Per Tube BID   feeding supplement (PROSource TF)  45 mL Per Tube BID   feeding supplement (VITAL AF 1.2 CAL)  1,000 mL Per Tube Q24H   mouth rinse  15 mL Mouth Rinse 10 times per day   methylPREDNISolone (SOLU-MEDROL) injection  40 mg Intravenous Q6H   pantoprazole  40 mg Intravenous Q12H   polyethylene glycol  17 g Per Tube Daily   revefenacin  175 mcg Nebulization Daily   Continuous Infusions:  sodium chloride 10 mL/hr at 07/09/21 0101   sodium chloride     doxycycline (VIBRAMYCIN) IV Stopped (07/13/21 1241)   fentaNYL infusion INTRAVENOUS 50 mcg/hr (07/13/21 1332)   norepinephrine (LEVOPHED) Adult infusion 4 mcg/min (07/13/21 1332)   piperacillin-tazobactam (ZOSYN)  IV 12.5 mL/hr at 07/13/21 1332    Procedures/Studies: CT HEAD WO CONTRAST (5MM)  Result Date: 07/12/2021 CLINICAL DATA:  Altered mental status EXAM: CT HEAD WITHOUT CONTRAST TECHNIQUE: Contiguous axial images were  obtained from the base of the skull through the vertex without intravenous contrast. COMPARISON:  08/29/2013 FINDINGS: Brain: There are no signs of bleeding within the cranium. There is small subtle focus of decreased density in the left frontal cortex which was not evident in the previous study. There is no significant focal mass effect. Cortical sulci are prominent. Vascular: Unremarkable. Skull: Unremarkable. Sinuses/Orbits: Unremarkable Other: None IMPRESSION: There are no signs of bleeding within the cranium. There is a new small focus of low attenuation in the left frontal cortex suggesting possible recent or old infarct in the left MCA distribution. If clinically warranted, follow-up MRI may be considered. Imaging findings were relayed to Dr. Carles Collet by telephone call. Electronically Signed   By: Elmer Picker M.D.   On: 07/12/2021 16:12   CT Angio Chest Pulmonary Embolism (PE) W or WO Contrast  Result Date: 07/09/2021 CLINICAL DATA:  Positive D-dimer with pulmonary embolism suspected EXAM: CT ANGIOGRAPHY CHEST WITH CONTRAST TECHNIQUE: Multidetector CT imaging of the chest was performed using the standard protocol during bolus administration of intravenous contrast. Multiplanar CT image reconstructions and MIPs were obtained to evaluate the vascular  anatomy. CONTRAST:  11m OMNIPAQUE IOHEXOL 350 MG/ML SOLN COMPARISON:  10/22/2018 FINDINGS: Cardiovascular: Satisfactory opacification of the pulmonary arteries to the segmental level. No evidence of pulmonary embolism when accounting for unavoidable motion artifact. Normal heart size. No pericardial effusion. Main pulmonary is enlarged to 4 cm in diameter. Atheromatous calcification of the aorta. Mediastinum/Nodes: No adenopathy or mass.  Benign-appearing thyroid Lungs/Pleura: Ground-glass opacity bilateral lungs which are relatively low volume. No honeycombing noted. No edema, effusion, or pneumothorax. Upper Abdomen: Negative Musculoskeletal: No acute  or aggressive finding Review of the MIP images confirms the above findings. IMPRESSION: 1. Bilateral pneumonia. 2. Negative for pulmonary embolism when accounting for motion artifact. Electronically Signed   By: JJorje GuildM.D.   On: 07/09/2021 04:07   MR ANGIO HEAD WO CONTRAST  Result Date: 07/12/2021 CLINICAL DATA:  Stroke suspected, possible infarct on same-day CT EXAM: MRI HEAD WITHOUT CONTRAST MRA HEAD WITHOUT CONTRAST TECHNIQUE: Multiplanar, multi-echo pulse sequences of the brain and surrounding structures were acquired without intravenous contrast. Angiographic images of the Circle of Willis were acquired using MRA technique without intravenous contrast. COMPARISON:  Prior MRI, correlation is made with 07/12/2021 CT head FINDINGS: MRI HEAD FINDINGS Evaluation is somewhat limited by motion artifact. Brain: Multiple foci of restricted diffusion in the bilateral cerebral and cerebellar hemispheres, with the largest area in the posterior left frontal cortex (series 5, images 21-29). Additional smaller areas in the bilateral cerebellar hemispheres (series 5, images 6-8), bilateral posterior occipital lobes (series 5, images 10-19), right greater than left parietal lobes (series 5, images 21-23 and 25-29), and bilateral frontal lobes (series 5, image 20 and 24-29). The aforementioned foci of restricted diffusion demonstrate ADC correlates, most likely acute and early subacute infarcts. Increased T2 signal is associated with several of these lesions, most prominently in the posterior left cortical lesion. No mass, mass effect, or midline shift. No hydrocephalus or extra-axial collection. Scattered T2 hyperintense signal in the periventricular white matter, likely the sequela of chronic small vessel ischemic disease. Vascular: Please see MRA findings below. Skull and upper cervical spine: Normal marrow signal. Sinuses/Orbits: Negative. Other: Fluid in left mastoid air cells. MRA HEAD FINDINGS Evaluation is  significantly limited by motion artifact. Anterior circulation: Both internal carotid arteries are poorly evaluated, secondary to motion artifact. Evaluation of the remainder of the anterior circulation is also limited secondary to motion; within this limitation, the left A1 is definitely patent. Distal MCA branches appear grossly patent. For evaluation of the bilateral M1 segments. Distal MCA branches appear perfused. Posterior circulation: Left dominant system. The proximal V4 segments are not included in the field of view. The basilar is grossly patent from the vertebrobasilar junction to the bifurcation. Flow is noted within the bilateral PCAs. The SCAs and PICAs are not well seen. Anatomic variants: None significant IMPRESSION: 1. 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 The largest areas of infarction is in the posterior left frontal cortex. No evidence of hemorrhagic transformation. No mass effect or midline shift. Given multiple vascular territories, an embolic etiology is suspected. 2. Evaluation of the intracranial vasculature is significantly limited by motion artifact. No definite large vessel occlusion is seen. These results were called by telephone at the time of interpretation on 07/12/2021 at 6:11 pm to provider DAVID TAT , who verbally acknowledged these results. Electronically Signed   By: AMerilyn BabaM.D.   On: 07/12/2021 18:11   MR BRAIN WO CONTRAST  Result Date: 07/12/2021 CLINICAL DATA:  Stroke suspected,  possible infarct on same-day CT EXAM: MRI HEAD WITHOUT CONTRAST MRA HEAD WITHOUT CONTRAST TECHNIQUE: Multiplanar, multi-echo pulse sequences of the brain and surrounding structures were acquired without intravenous contrast. Angiographic images of the Circle of Willis were acquired using MRA technique without intravenous contrast. COMPARISON:  Prior MRI, correlation is made with 07/12/2021 CT head FINDINGS: MRI HEAD FINDINGS Evaluation is  somewhat limited by motion artifact. Brain: Multiple foci of restricted diffusion in the bilateral cerebral and cerebellar hemispheres, with the largest area in the posterior left frontal cortex (series 5, images 21-29). Additional smaller areas in the bilateral cerebellar hemispheres (series 5, images 6-8), bilateral posterior occipital lobes (series 5, images 10-19), right greater than left parietal lobes (series 5, images 21-23 and 25-29), and bilateral frontal lobes (series 5, image 20 and 24-29). The aforementioned foci of restricted diffusion demonstrate ADC correlates, most likely acute and early subacute infarcts. Increased T2 signal is associated with several of these lesions, most prominently in the posterior left cortical lesion. No mass, mass effect, or midline shift. No hydrocephalus or extra-axial collection. Scattered T2 hyperintense signal in the periventricular white matter, likely the sequela of chronic small vessel ischemic disease. Vascular: Please see MRA findings below. Skull and upper cervical spine: Normal marrow signal. Sinuses/Orbits: Negative. Other: Fluid in left mastoid air cells. MRA HEAD FINDINGS Evaluation is significantly limited by motion artifact. Anterior circulation: Both internal carotid arteries are poorly evaluated, secondary to motion artifact. Evaluation of the remainder of the anterior circulation is also limited secondary to motion; within this limitation, the left A1 is definitely patent. Distal MCA branches appear grossly patent. For evaluation of the bilateral M1 segments. Distal MCA branches appear perfused. Posterior circulation: Left dominant system. The proximal V4 segments are not included in the field of view. The basilar is grossly patent from the vertebrobasilar junction to the bifurcation. Flow is noted within the bilateral PCAs. The SCAs and PICAs are not well seen. Anatomic variants: None significant IMPRESSION: 1. Multiple acute and subacute infarcts in the  bilateral cerebral and cerebellar hemispheres. The largest areas of infarction is in the posterior left frontal cortex. No evidence of hemorrhagic transformation. No mass effect or midline shift. Given multiple vascular territories, an embolic etiology is suspected. 2. Evaluation of the intracranial vasculature is significantly limited by motion artifact. No definite large vessel occlusion is seen. These results were called by telephone at the time of interpretation on 07/12/2021 at 6:11 pm to provider DAVID TAT , who verbally acknowledged these results. Electronically Signed   By: Merilyn Baba M.D.   On: 07/12/2021 18:11   DG CHEST PORT 1 VIEW  Result Date: 07/13/2021 CLINICAL DATA:  Shortness of breath EXAM: PORTABLE CHEST 1 VIEW COMPARISON:  07/12/2021 FINDINGS: There is interval placement of endotracheal tube with its tip 3.7 cm above the carina. Enteric tube is noted traversing the esophagus. Diffuse increase in interstitial markings are seen in both lungs, more so on the left side with no significant change. There is no focal consolidation. Lateral CP angles are clear. There is no pneumothorax. IMPRESSION: Diffuse prominence of interstitial markings in both lungs has not changed significantly. This may suggest interstitial pneumonia. Less likely possibility would be interstitial pulmonary edema. No new focal pulmonary consolidation is seen. Electronically Signed   By: Elmer Picker M.D.   On: 07/13/2021 12:04   DG CHEST PORT 1 VIEW  Result Date: 07/12/2021 CLINICAL DATA:  NG tube placement. EXAM: PORTABLE CHEST 1 VIEW COMPARISON:  Chest x-ray 07/12/2021. FINDINGS: Enteric  tube tip extends below the diaphragm into the stomach. The distal tip is not included on the image. Interstitial opacities throughout the mid and lower lungs appear unchanged. There is no pleural effusion or pneumothorax. Cardiomediastinal silhouette is within normal limits. No acute fractures are seen. IMPRESSION: 1. Enteric tube  extends below the diaphragm into the stomach, but the distal tip is not included on the image. 2. Unchanged diffuse bilateral lung infiltrates. Electronically Signed   By: Ronney Asters M.D.   On: 07/12/2021 22:24   DG CHEST PORT 1 VIEW  Result Date: 07/12/2021 CLINICAL DATA:  Shortness of breath. EXAM: PORTABLE CHEST 1 VIEW COMPARISON:  July 11, 2021 FINDINGS: Marked severity diffuse bilateral infiltrates. This is slightly more prominent within the bilateral lung bases and is increased in severity when compared to the prior study. There is no evidence of a pleural effusion or pneumothorax. The heart size and mediastinal contours are within normal limits. The visualized skeletal structures are unremarkable. IMPRESSION: Marked severity diffuse bilateral infiltrates, increased in severity when compared to the prior study. Electronically Signed   By: Virgina Norfolk M.D.   On: 07/12/2021 00:28   DG CHEST PORT 1 VIEW  Result Date: 07/11/2021 CLINICAL DATA:  Acute respiratory failure and hypoxia.  Pneumonia. EXAM: PORTABLE CHEST 1 VIEW COMPARISON:  07/09/2021 FINDINGS: Stable cardiomediastinal contours. Bilateral increase interstitial opacities are identified, left greater than right. The appearance is unchanged from previous exam. No new findings. IMPRESSION: No change in aeration to the lungs compared with previous exam. Electronically Signed   By: Kerby Moors M.D.   On: 07/11/2021 06:39   DG CHEST PORT 1 VIEW  Result Date: 07/09/2021 CLINICAL DATA:  Worsening SOB. Hx of bronchitis, COPD, HTN, pneumonia, and current smoker of 0.25 packs a day x 42 years. Negative covid, flu, and RSV yesterday. EXAM: PORTABLE CHEST 1 VIEW COMPARISON:  CT angio chest 07/09/2021, chest x-ray 06/12/2021 FINDINGS: The heart and mediastinal contours are unchanged. Aortic calcification. Interval increased interstitial and airspace opacities with prominence within the mid to lower lungs. No pleural effusion. No pneumothorax.  No acute osseous abnormality. IMPRESSION: Interval increase in marked interstitial and airspace opacities with prominence within the mid to lower lungs. Findings consistent with pulmonary edema. Electronically Signed   By: Iven Finn M.D.   On: 07/09/2021 22:13   DG Chest Portable 1 View  Result Date: 06/30/2021 CLINICAL DATA:  Shortness of breath.  Sepsis workup. EXAM: PORTABLE CHEST 1 VIEW COMPARISON:  Chest CT with contrast and portable chest both 10/22/2018. FINDINGS: There is mild cardiomegaly, normal caliber central vessels. There is a low inspiration. Interstitial and patchy hazy opacities in the left mid to lower lung field are noted most likely due to pneumonia. The remaining hypoinflated lungs are clear. No pleural effusion is seen. There is calcification of the transverse aorta. Thoracic spondylosis. IMPRESSION: Interstitial and patchy hazy opacities of the left mid to lower lung field, most likely due to pneumonia. This could all be in the upper lobe or could be a combination of upper and lower lobe disease. Clinical correlation and radiographic follow-up recommended. Limited view of the lung bases due to low inspiration Electronically Signed   By: Telford Nab M.D.   On: 06/25/2021 21:50   ECHOCARDIOGRAM COMPLETE  Result Date: 07/10/2021    ECHOCARDIOGRAM REPORT   Patient Name:   Cynthia [name] Date of Exam: 07/10/2021 Medical Rec #:  [id number]         Height:  63.0 in Accession #:    [id number]        Weight:       133.6 lb Date of Birth:  01-09-1951         BSA:          1.629 m Patient Age:    70 years          BP:           123/55 mmHg Patient Gender: F                 HR:           77 bpm. Exam Location:  [facility] Procedure: 2D Echo, Cardiac Doppler and Color Doppler Indications:    CHF  History:        Patient has prior history of Echocardiogram examinations, most                 recent 06/08/2017. COPD; Risk Factors:Hypertension and                 Dyslipidemia. Tobacco  abuse.  Sonographer:    Wenda Low Referring Phys: [phone] DAVID TAT IMPRESSIONS  1. Left ventricular ejection fraction, by estimation, is 60 to 65%. Left ventricular ejection fraction by PLAX is 64 %. The left ventricle has normal function. The left ventricle has no regional wall motion abnormalities. There is mild concentric left ventricular hypertrophy. Left ventricular diastolic parameters are consistent with Grade I diastolic dysfunction (impaired relaxation).  2. Right ventricular systolic function is normal. The right ventricular size is normal. There is normal pulmonary artery systolic pressure.  3. The mitral valve is normal in structure. Trivial mitral valve regurgitation. No evidence of mitral stenosis.  4. The aortic valve is normal in structure. Aortic valve regurgitation is trivial. No aortic stenosis is present.  5. The inferior vena cava is dilated in size with >50% respiratory variability, suggesting right atrial pressure of 8 mmHg. FINDINGS  Left Ventricle: Left ventricular ejection fraction, by estimation, is 60 to 65%. Left ventricular ejection fraction by PLAX is 64 %. The left ventricle has normal function. The left ventricle has no regional wall motion abnormalities. The left ventricular internal cavity size was normal in size. There is mild concentric left ventricular hypertrophy. Left ventricular diastolic parameters are consistent with Grade I diastolic dysfunction (impaired relaxation). Right Ventricle: The right ventricular size is normal. No increase in right ventricular wall thickness. Right ventricular systolic function is normal. There is normal pulmonary artery systolic pressure. The tricuspid regurgitant velocity is 2.44 m/s, and  with an assumed right atrial pressure of 8 mmHg, the estimated right ventricular systolic pressure is 57.3 mmHg. Left Atrium: Left atrial size was normal in size. Right Atrium: Right atrial size was normal in size. Pericardium: There is no evidence of  pericardial effusion. Mitral Valve: The mitral valve is normal in structure. Trivial mitral valve regurgitation. No evidence of mitral valve stenosis. MV peak gradient, 6.2 mmHg. The mean mitral valve gradient is 2.0 mmHg. Tricuspid Valve: The tricuspid valve is normal in structure. Tricuspid valve regurgitation is trivial. No evidence of tricuspid stenosis. Aortic Valve: The aortic valve is normal in structure. Aortic valve regurgitation is trivial. No aortic stenosis is present. Aortic valve mean gradient measures 5.0 mmHg. Aortic valve peak gradient measures 9.5 mmHg. Aortic valve area, by VTI measures 2.16 cm. Pulmonic Valve: The pulmonic valve was normal in structure. Pulmonic valve regurgitation is not visualized. No evidence of pulmonic stenosis. Aorta: The aortic  root is normal in size and structure. Venous: The inferior vena cava is dilated in size with greater than 50% respiratory variability, suggesting right atrial pressure of 8 mmHg. IAS/Shunts: No atrial level shunt detected by color flow Doppler.  LEFT VENTRICLE PLAX 2D LV EF:         Left            Diastology                ventricular     LV e' medial:  6.31 cm/s                ejection        LV e' lateral: 8.16 cm/s                fraction by                PLAX is 64                %. LVIDd:         4.30 cm LVIDs:         2.80 cm LV PW:         1.20 cm LV IVS:        1.70 cm LVOT diam:     2.00 cm LV SV:         77 LV SV Index:   47 LVOT Area:     3.14 cm  LV Volumes (MOD) LV vol d, MOD    39.6 ml A2C: LV vol d, MOD    42.4 ml A4C: LV vol s, MOD    12.1 ml A2C: LV vol s, MOD    13.7 ml A4C: LV SV MOD A2C:   27.5 ml LV SV MOD A4C:   42.4 ml LV SV MOD BP:    28.3 ml RIGHT VENTRICLE RV Basal diam:  3.50 cm RV Mid diam:    2.70 cm RV S prime:     12.90 cm/s TAPSE (M-mode): 2.4 cm LEFT ATRIUM             Index        RIGHT ATRIUM          Index LA diam:        3.40 cm 2.09 cm/m   RA Area:     9.87 cm LA Vol (A2C):   25.5 ml 15.65 ml/m  RA  Volume:   17.20 ml 10.56 ml/m LA Vol (A4C):   23.9 ml 14.67 ml/m LA Biplane Vol: 27.3 ml 16.76 ml/m  AORTIC VALVE                    PULMONIC VALVE AV Area (Vmax):    2.28 cm     PV Vmax:       0.89 m/s AV Area (Vmean):   2.15 cm     PV Peak grad:  3.1 mmHg AV Area (VTI):     2.16 cm AV Vmax:           154.00 cm/s AV Vmean:          97.500 cm/s AV VTI:            0.357 m AV Peak Grad:      9.5 mmHg AV Mean Grad:      5.0 mmHg LVOT Vmax:         112.00 cm/s LVOT Vmean:        66.600 cm/s LVOT VTI:  0.245 m LVOT/AV VTI ratio: 0.69  AORTA Ao Root diam: 3.20 cm Ao Asc diam:  2.60 cm MITRAL VALVE              TRICUSPID VALVE MV Area (PHT): 3.24 cm   TR Peak grad:   23.8 mmHg MV Area VTI:   2.16 cm   TR Vmax:        244.00 cm/s MV Peak grad:  6.2 mmHg MV Mean grad:  2.0 mmHg   SHUNTS MV Vmax:       1.24 m/s   Systemic VTI:  0.24 m MV Vmean:      69.4 cm/s  Systemic Diam: 2.00 cm Skeet Latch MD Electronically signed by Skeet Latch MD Signature Date/Time: 07/10/2021/11:53:33 AM    Final     Irwin Brakeman, MD  [facility]  Critical Care Procedure Note Authorized and Performed by: Murvin Natal MD  Total Critical Care time:  49 mins  Due to a high probability of clinically significant, life threatening deterioration, the patient required my highest level of preparedness to intervene emergently and I personally spent this critical care time directly and personally managing the patient.  This critical care time included obtaining a history; examining the patient, pulse oximetry; ordering and review of studies; arranging urgent treatment with development of a management plan; evaluation of patient's response of treatment; frequent reassessment; and discussions with other providers.  This critical care time was performed to assess and manage the high probability of imminent and life threatening deterioration that could result in multi-organ failure.  It was exclusive of separately billable  procedures and treating other patients and teaching time.     If 7PM-7AM, please contact night-coverage [email or web address] Password [email or web address] 07/13/2021, 2:29 PM   LOS: 5 days

## 2021-07-13 NOTE — ED Provider Notes (Signed)
°  Physical Exam  BP (!) 176/87    Pulse (!) 52    Temp (!) 95.6 F (35.3 C) (Rectal) Comment: nurse in room   Resp (!) 23    Ht 5\' 3"  (1.6 m)    Wt 58.5 kg    SpO2 100%    BMI 22.85 kg/m   Physical Exam  Procedures  Procedure Name: Intubation Date/Time: 07/13/2021 11:51 AM Performed by: Isla Pence, MD Pre-anesthesia Checklist: Patient identified Oxygen Delivery Method: Nasal cannula Preoxygenation: Pre-oxygenation with 100% oxygen Induction Type: Rapid sequence Laryngoscope Size: Glidescope and 3 Tube size: 7.5 mm Number of attempts: 1 Placement Confirmation: ETT inserted through vocal cords under direct vision, Positive ETCO2 and Breath sounds checked- equal and bilateral Secured at: 23 cm Tube secured with: ETT holder Dental Injury: Teeth and Oropharynx as per pre-operative assessment      ED Course / MDM    Medical Decision Making  I was called to the ICU to intubate this lady for worsening sob and decreasing mental status.  Dr. Wynetta Emery ([facility]) notified after intubation was successful.       Isla Pence, MD 07/13/21 1152

## 2021-07-14 ENCOUNTER — Inpatient Hospital Stay (HOSPITAL_COMMUNITY): Payer: Medicare Other

## 2021-07-14 ENCOUNTER — Encounter (HOSPITAL_COMMUNITY): Payer: Self-pay | Admitting: Internal Medicine

## 2021-07-14 DIAGNOSIS — R9389 Abnormal findings on diagnostic imaging of other specified body structures: Secondary | ICD-10-CM

## 2021-07-14 DIAGNOSIS — E8809 Other disorders of plasma-protein metabolism, not elsewhere classified: Secondary | ICD-10-CM

## 2021-07-14 DIAGNOSIS — I639 Cerebral infarction, unspecified: Secondary | ICD-10-CM

## 2021-07-14 DIAGNOSIS — I6340 Cerebral infarction due to embolism of unspecified cerebral artery: Secondary | ICD-10-CM

## 2021-07-14 DIAGNOSIS — I6349 Cerebral infarction due to embolism of other cerebral artery: Secondary | ICD-10-CM

## 2021-07-14 DIAGNOSIS — I6389 Other cerebral infarction: Secondary | ICD-10-CM

## 2021-07-14 DIAGNOSIS — E46 Unspecified protein-calorie malnutrition: Secondary | ICD-10-CM

## 2021-07-14 HISTORY — DX: Cerebral infarction due to embolism of unspecified cerebral artery: I63.40

## 2021-07-14 LAB — POCT I-STAT 7, (LYTES, BLD GAS, ICA,H+H)
Acid-Base Excess: 0 mmol/L (ref 0.0–2.0)
Acid-base deficit: 3 mmol/L — ABNORMAL HIGH (ref 0.0–2.0)
Bicarbonate: 26.8 mmol/L (ref 20.0–28.0)
Bicarbonate: 24.1 mmol/L (ref 20.0–28.0)
Calcium, Ion: 1.27 mmol/L (ref 1.15–1.40)
Calcium, Ion: 1.33 mmol/L (ref 1.15–1.40)
HCT: 28 % — ABNORMAL LOW (ref 36.0–46.0)
HCT: 32 % — ABNORMAL LOW (ref 36.0–46.0)
Hemoglobin: 9.5 g/dL — ABNORMAL LOW (ref 12.0–15.0)
Hemoglobin: 10.9 g/dL — ABNORMAL LOW (ref 12.0–15.0)
O2 Saturation: 97 %
O2 Saturation: 93 %
Patient temperature: 97.5
Potassium: 2.8 mmol/L — ABNORMAL LOW (ref 3.5–5.1)
Potassium: 3.2 mmol/L — ABNORMAL LOW (ref 3.5–5.1)
Sodium: 152 mmol/L — ABNORMAL HIGH (ref 135–145)
Sodium: 156 mmol/L — ABNORMAL HIGH (ref 135–145)
TCO2: 28 mmol/L (ref 22–32)
TCO2: 26 mmol/L (ref 22–32)
pCO2 arterial: 56.4 mmHg — ABNORMAL HIGH (ref 32.0–48.0)
pCO2 arterial: 50.5 mmHg — ABNORMAL HIGH (ref 32.0–48.0)
pH, Arterial: 7.284 — ABNORMAL LOW (ref 7.350–7.450)
pH, Arterial: 7.283 — ABNORMAL LOW (ref 7.350–7.450)
pO2, Arterial: 109 mmHg — ABNORMAL HIGH (ref 83.0–108.0)
pO2, Arterial: 75 mmHg — ABNORMAL LOW (ref 83.0–108.0)

## 2021-07-14 LAB — CBC
HCT: 26.7 % — ABNORMAL LOW (ref 36.0–46.0)
Hemoglobin: 8.9 g/dL — ABNORMAL LOW (ref 12.0–15.0)
MCH: 27.9 pg (ref 26.0–34.0)
MCHC: 33.3 g/dL (ref 30.0–36.0)
MCV: 83.7 fL (ref 80.0–100.0)
Platelets: 166 10*3/uL (ref 150–400)
RBC: 3.19 MIL/uL — ABNORMAL LOW (ref 3.87–5.11)
RDW: 16.5 % — ABNORMAL HIGH (ref 11.5–15.5)
WBC: 14.8 10*3/uL — ABNORMAL HIGH (ref 4.0–10.5)
nRBC: 0.3 % — ABNORMAL HIGH (ref 0.0–0.2)

## 2021-07-14 LAB — MAGNESIUM: Magnesium: 2.5 mg/dL — ABNORMAL HIGH (ref 1.7–2.4)

## 2021-07-14 LAB — BASIC METABOLIC PANEL
Anion gap: 9 (ref 5–15)
BUN: 42 mg/dL — ABNORMAL HIGH (ref 8–23)
CO2: 23 mmol/L (ref 22–32)
Calcium: 8.8 mg/dL — ABNORMAL LOW (ref 8.9–10.3)
Chloride: 120 mmol/L — ABNORMAL HIGH (ref 98–111)
Creatinine, Ser: 1.54 mg/dL — ABNORMAL HIGH (ref 0.44–1.00)
GFR, Estimated: 37 mL/min — ABNORMAL LOW (ref >60)
Glucose, Bld: 272 mg/dL — ABNORMAL HIGH (ref 70–99)
Potassium: 3.3 mmol/L — ABNORMAL LOW (ref 3.5–5.1)
Sodium: 152 mmol/L — ABNORMAL HIGH (ref 135–145)

## 2021-07-14 LAB — BODY FLUID CELL COUNT WITH DIFFERENTIAL
Eos, Fluid: 0 %
Lymphs, Fluid: 6 %
Monocyte-Macrophage-Serous Fluid: 20 % — ABNORMAL LOW (ref 50–90)
Neutrophil Count, Fluid: 74 % — ABNORMAL HIGH (ref 0–25)
Total Nucleated Cell Count, Fluid: 435 cu mm (ref 0–1000)

## 2021-07-14 LAB — GLUCOSE, CAPILLARY
Glucose-Capillary: 199 mg/dL — ABNORMAL HIGH (ref 70–99)
Glucose-Capillary: 226 mg/dL — ABNORMAL HIGH (ref 70–99)
Glucose-Capillary: 233 mg/dL — ABNORMAL HIGH (ref 70–99)
Glucose-Capillary: 200 mg/dL — ABNORMAL HIGH (ref 70–99)
Glucose-Capillary: 209 mg/dL — ABNORMAL HIGH (ref 70–99)
Glucose-Capillary: 180 mg/dL — ABNORMAL HIGH (ref 70–99)

## 2021-07-14 LAB — COMPREHENSIVE METABOLIC PANEL
ALT: 33 U/L (ref 0–44)
AST: 87 U/L — ABNORMAL HIGH (ref 15–41)
Albumin: 1.9 g/dL — ABNORMAL LOW (ref 3.5–5.0)
Alkaline Phosphatase: 108 U/L (ref 38–126)
Anion gap: 8 (ref 5–15)
BUN: 38 mg/dL — ABNORMAL HIGH (ref 8–23)
CO2: 23 mmol/L (ref 22–32)
Calcium: 8.5 mg/dL — ABNORMAL LOW (ref 8.9–10.3)
Chloride: 119 mmol/L — ABNORMAL HIGH (ref 98–111)
Creatinine, Ser: 1.52 mg/dL — ABNORMAL HIGH (ref 0.44–1.00)
GFR, Estimated: 37 mL/min — ABNORMAL LOW (ref >60)
Glucose, Bld: 221 mg/dL — ABNORMAL HIGH (ref 70–99)
Potassium: 2.9 mmol/L — ABNORMAL LOW (ref 3.5–5.1)
Sodium: 150 mmol/L — ABNORMAL HIGH (ref 135–145)
Total Bilirubin: 0.5 mg/dL (ref 0.3–1.2)
Total Protein: 6.2 g/dL — ABNORMAL LOW (ref 6.5–8.1)

## 2021-07-14 LAB — ANCA PROFILE
Anti-MPO Antibodies: <0.2 units (ref 0.0–0.9)
Anti-PR3 Antibodies: <0.2 units (ref 0.0–0.9)
Atypical P-ANCA titer: <1:20 {titer}
C-ANCA: <1:20 {titer}
P-ANCA: <1:20 {titer}

## 2021-07-14 LAB — PHOSPHORUS: Phosphorus: 3.2 mg/dL (ref 2.5–4.6)

## 2021-07-14 MED ORDER — FREE WATER
200.0000 mL | Status: DC
  Administered 2021-07-14 – 2021-07-15 (×5): 200 mL

## 2021-07-14 MED ORDER — POTASSIUM CHLORIDE CRYS ER 20 MEQ PO TBCR: 20.0000 meq | EXTENDED_RELEASE_TABLET | ORAL | Status: DC

## 2021-07-14 MED ORDER — METHYLPREDNISOLONE SODIUM SUCC 125 MG IJ SOLR
40.0000 mg | Freq: Two times a day (BID) | INTRAMUSCULAR | Status: AC
  Administered 2021-07-14: 40 mg via INTRAVENOUS
  Filled 2021-07-14: qty 2

## 2021-07-14 MED ORDER — PROSOURCE TF PO LIQD
45.0000 mL | Freq: Every day | ORAL | Status: DC
  Administered 2021-07-15 – 2021-07-19 (×4): 45 mL
  Filled 2021-07-14 (×5): qty 45

## 2021-07-14 MED ORDER — MIDAZOLAM HCL 2 MG/2ML IJ SOLN
2.0000 mg | Freq: Once | INTRAMUSCULAR | Status: AC
  Administered 2021-07-14: 2 mg via INTRAVENOUS

## 2021-07-14 MED ORDER — METHYLPREDNISOLONE SODIUM SUCC 125 MG IJ SOLR
80.0000 mg | INTRAMUSCULAR | Status: DC
  Administered 2021-07-15 – 2021-07-16 (×2): 80 mg via INTRAVENOUS
  Filled 2021-07-14 (×2): qty 2

## 2021-07-14 MED ORDER — INSULIN ASPART 100 UNIT/ML IJ SOLN
2.0000 [IU] | INTRAMUSCULAR | Status: DC
  Administered 2021-07-14 – 2021-07-15 (×6): 2 [IU] via SUBCUTANEOUS

## 2021-07-14 MED ORDER — VITAL AF 1.2 CAL PO LIQD
1000.0000 mL | ORAL | Status: DC
  Administered 2021-07-14: 1000 mL

## 2021-07-14 MED ORDER — POTASSIUM CHLORIDE 20 MEQ PO PACK
20.0000 meq | PACK | ORAL | Status: AC
  Administered 2021-07-14 (×2): 20 meq
  Filled 2021-07-14 (×2): qty 1

## 2021-07-14 MED ORDER — POTASSIUM CHLORIDE 20 MEQ PO PACK
60.0000 meq | PACK | Freq: Once | ORAL | Status: AC
  Administered 2021-07-14: 60 meq
  Filled 2021-07-14: qty 3

## 2021-07-14 MED ORDER — INSULIN ASPART 100 UNIT/ML IJ SOLN
0.0000 [IU] | INTRAMUSCULAR | Status: DC
  Administered 2021-07-14 (×2): 2 [IU] via SUBCUTANEOUS
  Administered 2021-07-14 (×2): 3 [IU] via SUBCUTANEOUS
  Administered 2021-07-15: 1 [IU] via SUBCUTANEOUS
  Administered 2021-07-15: 2 [IU] via SUBCUTANEOUS
  Administered 2021-07-15: 3 [IU] via SUBCUTANEOUS
  Administered 2021-07-15: 2 [IU] via SUBCUTANEOUS
  Administered 2021-07-16 – 2021-07-19 (×10): 1 [IU] via SUBCUTANEOUS

## 2021-07-14 NOTE — Progress Notes (Signed)
Nutrition Follow-up  DOCUMENTATION CODES:   Not applicable  INTERVENTION:   Continue tube feeding via OG tube: Vital AF 1.2 at 45 ml/h (1080 ml per day) Prosource TF 45 ml once daily  Provides 1336 kcal, 92 gm protein, 876 ml free water daily  NUTRITION DIAGNOSIS:   Inadequate oral intake related to inability to eat as evidenced by NPO status.  Ongoing   GOAL:   Provide needs based on ASPEN/SCCM guidelines  Met with TF  MONITOR:   Vent status, Labs, I & O's, Weight trends  REASON FOR ASSESSMENT:   Ventilator, Low Braden    ASSESSMENT:   Patient is a 71 yo female with COPD, GERD, HTN, CHF and PTSD. She presented 4 days ago from home with shortness of breath, N/V/D for 24-48 hr prior to admission. Sepsis related to CAP.  Discussed patient in ICU rounds and with RN today. Patient [facility] to [facility] 1/4. TF was initiated 1/4, currently receiving Vital AF 1.2 at 40 ml/h with Prosource TF 45 ml BID providing 1232 kcal, 94 gm protein, 779 ml free water daily. Free water flushes 200 ml every 4 hours.   She remains intubated on ventilator support. MV: 11 L/min Temp (24hrs), Avg:97.7 F (36.5 C), Min:97.4 F (36.3 C), Max:98.6 F (37 C)   Labs reviewed. Na 156, K 3.2, mag 2.5 CBG: 226-233  Medications reviewed and include Levophed, Colace, Novolog, Solu-medrol, Protonix, Miralax, KCl.   NUTRITION - FOCUSED PHYSICAL EXAM:  Flowsheet Row Most Recent Value  Orbital Region Mild depletion  Upper Arm Region No depletion  Thoracic and Lumbar Region No depletion  Buccal Region Unable to assess  Temple Region No depletion  Clavicle Bone Region No depletion  Clavicle and Acromion Bone Region Mild depletion  Scapular Bone Region Mild depletion  Dorsal Hand Mild depletion  Patellar Region Mild depletion  Anterior Thigh Region Mild depletion  Posterior Calf Region Moderate depletion  Edema (RD Assessment) None  Hair Reviewed  Eyes Unable to assess  Mouth  Unable to assess  Skin Reviewed  Nails Reviewed       Diet Order:   Diet Order             Diet NPO time specified  Diet effective now                   EDUCATION NEEDS:   Not appropriate for education at this time  Skin:  Skin Assessment: Skin Integrity Issues: Skin Integrity Issues:: DTI DTI: R buttocks  Last BM:  1/4  Height:   Ht Readings from Last 1 Encounters:  07/13/21 _0  (1.6 m)    Weight:   Wt Readings from Last 1 Encounters:  07/14/21 58.2 kg    BMI:  Body mass index is 22.73 kg/m.  Estimated Nutritional Needs:   Kcal:  1350  Protein:  85-95 gm  Fluid:  > 1.5 L    Lucas Mallow RD, LDN, CNSC Please refer to Amion for contact information.

## 2021-07-14 NOTE — Progress Notes (Signed)
[facility] Progress Note Patient Name: Cynthia [name] DOB: 14-Dec-1950 MRN: [id number]   Date of Service  07/14/2021  HPI/Events of Note  Large liquid BM's - Nursing request for Flexiseal.   eICU Interventions  Plan: Place Flexiseal.      Intervention Category Major Interventions: Other:  Lysle Dingwall 07/14/2021, 9:39 PM

## 2021-07-14 NOTE — Progress Notes (Signed)
[facility] Progress Note Patient Name: Cynthia [name] DOB: Aug 26, 1950 MRN: [id number]   Date of Service  07/14/2021  HPI/Events of Note  ABG on 50%/PRVC 20/TV 420/P 5 = 7.28/56.4/109/26.8  eICU Interventions  Plan: Increase PRVC rate to 24. Repeat ABG at 7:30 AM.     Intervention Category Major Interventions: Acid-Base disturbance - evaluation and management;Respiratory failure - evaluation and management  Khylee Algeo [name] 07/14/2021, 5:23 AM

## 2021-07-14 NOTE — Progress Notes (Signed)
Bilateral lower extremity venous duplex has been completed. Preliminary results can be found in CV Proc through chart review.   07/14/21 10:13 AM Cynthia [name] RVT

## 2021-07-14 NOTE — Progress Notes (Signed)
RT NOTE: holding SBT on patient this AM due to ABG values.  Will continue to monitor.

## 2021-07-14 NOTE — Progress Notes (Signed)
°  Echocardiogram 2D Echocardiogram has been performed.  Cynthia [name] 07/14/2021, 11:00 AM

## 2021-07-14 NOTE — Progress Notes (Addendum)
STROKE TEAM PROGRESS NOTE   INTERVAL HISTORY No family is at the bedside.  Patient is intubated and sedated with fentanyl at 150 mcgs/hr. CCM plans to do a bronchoscopy later today.  Plan for TCD with bubble study, echo with bubble, and venous duplex of the bilateral lower extremities as well. She is hemodynamically stable and able to follow commands with her extremities, unable to follow midline commands. Exam limited by ETT and sedation.  MRI scan of the brain shows bilateral embolic infarcts with the largest 1 being the in the left frontal cortex.  MR angiogram of the brain shows no significant large vessel intracranial stenosis.  Carotid ultrasound shows no significant extracranial stenosis.  Echocardiogram showed ejection fraction of 60%. Vitals:   07/14/21 1045 07/14/21 1103 07/14/21 1118 07/14/21 1141  BP: (!) 107/41  (!) 107/43   Pulse: (!) 56  (!) 55   Resp: 15  (!) 24   Temp:  (!) 97.5 F (36.4 C)    TempSrc:  Axillary    SpO2: 99%  100% 100%  Weight:      Height:       CBC:  Recent Labs  Lab 07/13/21 0432 07/14/21 0354 07/14/21 0424 07/14/21 1123  WBC 9.7 14.8*  --   --   HGB 9.4* 8.9* 9.5* 10.9*  HCT 29.1* 26.7* 28.0* 32.0*  MCV 84.8 83.7  --   --   PLT 183 166  --   --    Basic Metabolic Panel:  Recent Labs  Lab 07/09/21 0534 07/09/21 2149 07/12/21 0359 07/13/21 0432 07/14/21 0354 07/14/21 0424 07/14/21 1106 07/14/21 1123  NA 139   < > 144   < > 150*   < > 152* 156*  K 2.9*   < > 3.4*   < > 2.9*   < > 3.3* 3.2*  CL 110   < > 108   < > 119*  --  120*  --   CO2 17*   < > 25   < > 23  --  23  --   GLUCOSE 137*   < > 88   < > 221*  --  272*  --   BUN 12   < > 24*   < > 38*  --  42*  --   CREATININE 0.91   < > 1.25*   < > 1.52*  --  1.54*  --   CALCIUM 8.4*   < > 8.3*   < > 8.5*  --  8.8*  --   MG 1.7   < > 2.3  --  2.5*  --   --   --   PHOS 2.5  --   --   --  3.2  --   --   --    < > = values in this interval not displayed.   Lipid Panel:  Recent Labs   Lab 07/13/21 0432  CHOL 78  TRIG 144  HDL 11*  CHOLHDL 7.1  VLDL 29  LDLCALC 38   HgbA1c:  Recent Labs  Lab 07/13/21 0432  HGBA1C 5.5   Urine Drug Screen: No results for input(s): LABOPIA, COCAINSCRNUR, LABBENZ, AMPHETMU, THCU, LABBARB in the last 168 hours.  Alcohol Level No results for input(s): ETH in the last 168 hours.  IMAGING past 24 hours US Carotid Bilateral  Result Date: 07/13/2021 CLINICAL DATA:  Acute ischemic stroke EXAM: BILATERAL CAROTID DUPLEX ULTRASOUND TECHNIQUE: Pearline Cables scale imaging, color Doppler and duplex ultrasound were performed of  bilateral carotid and vertebral arteries in the neck. COMPARISON:  None. FINDINGS: Criteria: Quantification of carotid stenosis is based on velocity parameters that correlate the residual internal carotid diameter with NASCET-based stenosis levels, using the diameter of the distal internal carotid lumen as the denominator for stenosis measurement. The following velocity measurements were obtained: RIGHT ICA: 86/17 cm/sec CCA: 16/0 cm/sec SYSTOLIC ICA/CCA RATIO:  0.9 ECA:  87 cm/sec LEFT ICA: 149/21 cm/sec CCA: 109/32 cm/sec SYSTOLIC ICA/CCA RATIO:  1.0 ECA:  127 cm/sec RIGHT CAROTID ARTERY: Focal heterogeneous atherosclerotic plaque in the proximal internal carotid artery. By peak systolic velocity criteria, the estimated stenosis is less than 50%. RIGHT VERTEBRAL ARTERY:  Patent with normal antegrade flow. LEFT CAROTID ARTERY: No significant atherosclerotic plaque or evidence of stenosis in the proximal internal carotid artery. LEFT VERTEBRAL ARTERY:  Patent with normal antegrade flow. IMPRESSION: Mild (1-49%) stenosis proximal right internal carotid artery secondary to heterogenous atherosclerotic plaque. No significant atherosclerotic plaque or evidence of stenosis in the left internal carotid artery. Vertebral arteries are patent with normal antegrade flow. Electronically Signed   By: Jacqulynn Cadet M.D.   On: 07/13/2021 16:11   DG  Chest Port 1 View  Result Date: 07/14/2021 CLINICAL DATA:  Difficulty breathing EXAM: PORTABLE CHEST 1 VIEW COMPARISON:  Previous studies including the examination of 07/13/2021 FINDINGS: Tip of endotracheal tube is 3.2 cm above the carina. Enteric tube is noted traversing the esophagus. Diffuse increased interstitial markings are noted in both lungs with no significant interval change. Findings suggest interstitial pneumonia and possibly interstitial edema. There is no new focal pulmonary consolidation. Costophrenic angles are clear. There is no pneumothorax. IMPRESSION: No significant interval changes are noted in diffuse increased interstitial markings in both lungs. Electronically Signed   By: Elmer Picker M.D.   On: 07/14/2021 08:05   DG CHEST PORT 1 VIEW  Result Date: 07/13/2021 CLINICAL DATA:  Intubated, abnormal chest x-ray EXAM: PORTABLE CHEST 1 VIEW COMPARISON:  07/13/2021 FINDINGS: Single frontal view of the chest demonstrates endotracheal tube overlying tracheal air column tip 2.4 cm above carina. Enteric catheter passes below diaphragm tip excluded by collimation. The cardiac silhouette is stable. Persistent bilateral interstitial and ground-glass opacities, greatest at the lung bases. No effusion or pneumothorax. No acute bony abnormalities. IMPRESSION: 1. Support devices as above. 2. Diffuse interstitial and ground-glass opacities, greatest at the bases. Differential remains edema, infection, or widespread aspiration. Electronically Signed   By: Randa Ngo M.D.   On: 07/13/2021 19:13   VAS [place] LOWER EXTREMITY VENOUS (DVT)  Result Date: 07/14/2021  Lower Venous DVT Study Patient Name:  Cynthia [name]  Date of Exam:   07/14/2021 Medical Rec #: [id number]          Accession #:    [id number] Date of Birth: 1950/12/05          Patient Gender: F Patient Age:   71 years Exam Location:  [facility] - Closed Procedure:      VAS [place] LOWER EXTREMITY VENOUS (DVT) Referring Phys: Alferd Patee [facility]  --------------------------------------------------------------------------------  Indications: Stroke.  Risk Factors: None identified. Limitations: Poor ultrasound/tissue interface and patient positioning, patient constant movement. Comparison Study: No prior studies. Performing Technologist: Oliver Hum RVT  Examination Guidelines: A complete evaluation includes B-mode imaging, spectral Doppler, color Doppler, and power Doppler as needed of all accessible portions of each vessel. Bilateral testing is considered an integral part of a complete examination. Limited examinations for reoccurring indications may be performed as noted. The reflux portion of the  exam is performed with the patient in reverse Trendelenburg.  +---------+---------------+---------+-----------+----------+--------------+  RIGHT     Compressibility Phasicity Spontaneity Properties Thrombus Aging  +---------+---------------+---------+-----------+----------+--------------+  CFV       Full            Yes       Yes                                    +---------+---------------+---------+-----------+----------+--------------+  SFJ       Full                                                             +---------+---------------+---------+-----------+----------+--------------+  FV Prox   Full                                                             +---------+---------------+---------+-----------+----------+--------------+  FV Mid    Full                                                             +---------+---------------+---------+-----------+----------+--------------+  FV Distal Full                                                             +---------+---------------+---------+-----------+----------+--------------+  PFV       Full                                                             +---------+---------------+---------+-----------+----------+--------------+  POP       Full            Yes       Yes                                     +---------+---------------+---------+-----------+----------+--------------+  PTV       Full                                                             +---------+---------------+---------+-----------+----------+--------------+  PERO      Full                                                             +---------+---------------+---------+-----------+----------+--------------+   +---------+---------------+---------+-----------+----------+--------------+  LEFT      Compressibility Phasicity Spontaneity Properties Thrombus Aging  +---------+---------------+---------+-----------+----------+--------------+  CFV       Full            Yes       Yes                                    +---------+---------------+---------+-----------+----------+--------------+  SFJ       Full                                                             +---------+---------------+---------+-----------+----------+--------------+  FV Prox   Full                                                             +---------+---------------+---------+-----------+----------+--------------+  FV Mid    Full                                                             +---------+---------------+---------+-----------+----------+--------------+  FV Distal Full                                                             +---------+---------------+---------+-----------+----------+--------------+  PFV       Full                                                             +---------+---------------+---------+-----------+----------+--------------+  POP       Full            Yes       Yes                                    +---------+---------------+---------+-----------+----------+--------------+  PTV       Full                                                             +---------+---------------+---------+-----------+----------+--------------+  PERO      Full                                                              +---------+---------------+---------+-----------+----------+--------------+  Summary: RIGHT: - There is no evidence of deep vein thrombosis in the lower extremity. However, portions of this examination were limited- see technologist comments above.  - No cystic structure found in the popliteal fossa.  LEFT: - There is no evidence of deep vein thrombosis in the lower extremity. However, portions of this examination were limited- see technologist comments above.  - No cystic structure found in the popliteal fossa.  *See table(s) above for measurements and observations.    Preliminary     PHYSICAL EXAM  Physical Exam  Constitutional: Frail elderly African-American lady who is intubated and sedated Psych: Affect appropriate to situation Cardiovascular: Normal rate and regular rhythm.  Respiratory: Effort normal, non-labored breathing  Neuro: Mental Status: Intubated, sedated with fentanyl, opens eyes to voice but does not follow any midline commands.  Slight left gaze preference.  Pupils equal 2 mm sluggishly reactive.  Reflexes are sluggish.  Also movements are sluggish. Moves all extremities purposefully, follows commands with extremities Not following midline commands Cranial Nerves: II: Pupils are equal, round, and reactive to light.   III,IV, VI: EOMI without ptosis or diploplia.  V: Facial sensation is symmetric to temperature VII: No facial asymmetry, no nasolabial fold flattening, obstructed by ET tube VIII: Hearing is intact to voice X: Cough and gag intact XI: Head is midline XII: Tongue is midline, does not protrude on command Motor: Tone is normal. Bulk is poor.  Moves all extremities antigravity, able to briefly hold extremities off the bed left greater than right Sensory: Withdraws to pain in all extremities Coordination/Gait: Deferred    ASSESSMENT/PLAN Ms. TEKOA HAMOR is a 71 y.o. female with history of COPD, HTN, Depression, GERD, HTN, migraines, hx of tobacco  use who was admitted to [facility] with SOB with exertion along with nausea and vomiting and a fever of 102.37F. She was found to have Pneumonia, acute GI bleed.  Blood cultures were sent and have so far been negative as well as respiratory culture.  She is on antibiotics including Zosyn and doxycycline.  GI was consulted for the GI bleed, they are deferring the endoscopy until her respiratory status improves. She developed encephalopathy during admission and an MRI Brain demonstrated multple embolic appearing strokes. She was intubated for worsening SOB and mentation. She was transferred to [facility] for further evaluation and management 07/13/2021.  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  She remains intubated moving all extremities antigravity and following simple extremity commands.  CCM plans to bronch her today.  2D echo, venous Doppler, TCD with bubble pending.   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  Largest infarction in the left posterior frontal cortex. MRA  Motion degraded, no sign of LVO 2D Echo pending TCD bubble study pending Venous Duplex LE- pending LDL 38 HgbA1c 5.5 VTE prophylaxis - SCDs No antithrombotic prior to admission, now on aspirin 81 mg daily and clopidogrel 75 mg daily.  Therapy recommendations:  pending Disposition:  pending  Hypertension Home meds: Norvasc 10 mg, propranolol 80 mg, Lasix 20 mg Stable Permissive hypertension (OK if < 220/120) but gradually normalize in 5-7 days Long-term BP goal normotensive  Hyperlipidemia Home meds: Atorvastatin 40 mg,  resumed in hospital LDL 38, goal < 70 High intensity statin not indicated  Continue statin at discharge  Other Stroke Risk Factors Advanced Age >/= 25  Cigarette smoker, advised to stop smoking Migraines  Other Active Problems Tube feed infusing Acute Hypoxic Respiratory failure-managed by CCM Intubated at [facility] Tidal volume 420, respiratory rate 24, FiO2 40%, PEEP of 5 ABX- doxy and zosyn Leukocytosis- WBC-14.8 COPD continue yupelri, brovana, pulmicort PRN albuterol Hypotension Levophed GI Bleed CBC daily  H/H- 8.9/26.7 GI consult AKI BUN/Cr. 42/1.54 Hypokalemia K- 3.3 Replace per primary team Hyperglycemia- no diagnosis of DM Ha1C- 5.5 CBG 272 SSI  Hospital day # 6  Patient seen and examined by NP/APP with MD. MD to update note as needed.   Janine Ores, DNP, FNP-BC [facility] Pager: [phone]  STROKE MD NOTE :  I have personally obtained history,examined this patient, reviewed notes, independently viewed imaging studies, participated in medical decision making and plan of care.ROS completed by me personally and pertinent positives fully documented  I have made any additions or clarifications directly to the above note. Agree with note above.  Patient presented with pneumonia and altered mental status and subsequently MRI done few days later shows bicerebral embolic infarcts Etiology to be determined but possibilities include endocarditis given fever on admission and pneumonia versus paradoxical embolism versus atrial fibrillation or other cardiac source.  Recommend aspirin for stroke prevention for now and check TCD bubble study for PFO and lower extremity venous Dopplers for DVT.  If unyielding consider TEE vegetations from cardiac source of embolism.  Treatment of pneumonia and respiratory failure as per critical care team and extubate as tolerated.  No family available at the bedside for discussion.  Discussed with Dr. Shearon Stalls critical  care medicine.This patient is critically ill and at significant risk of neurological worsening, death and care requires constant monitoring of vital signs, hemodynamics,respiratory and cardiac monitoring, extensive review of multiple databases, frequent neurological assessment, discussion with family, other specialists and medical decision making of high complexity.I have made any additions or clarifications directly to the above note.This critical care time does not reflect procedure time, or teaching time or supervisory time of PA/NP/Med Resident etc but could involve care discussion time.  I spent 30 minutes of neurocritical care time  in the care of  this patient.     Antony Contras, MD Medical Director [facility] Pager: [phone] 07/14/2021 2:41 PM  To contact Stroke Continuity provider, please refer to [email or web address] After hours, contact General Neurology
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
## 2021-07-14 NOTE — Procedures (Signed)
Bronchoscopy Procedure Note  DEMAYA [name]  [id number]  1950/10/08  Date:07/14/21  Time:3:33 PM   Provider Performing:Paetyn Pietrzak S Shearon Stalls   Procedure(s):  Flexible bronchoscopy with bronchial alveolar lavage [phone])  Indication(s) pneumonia  Consent Risks of the procedure as well as the alternatives and risks of each were explained to the patient and/or caregiver.  Consent for the procedure was obtained and is signed in the bedside chart  Anesthesia Fentanyl, versed   Time Out Verified patient identification, verified procedure, site/side was marked, verified correct patient position, special equipment/implants available, medications/allergies/relevant history reviewed, required imaging and test results available.   Sterile Technique Usual hand hygiene, masks, gowns, and gloves were used   Procedure Description Bronchoscope advanced through endotracheal tube and into airway.  Airways were examined down to subsegmental level with findings noted below.  Carina was sharp. There were no endobronchial lesions. Diffusely erythematous airways, no major secretions. Following diagnostic evaluation, BAL(s) performed in 90 with normal saline and return of 45 fluid  Findings: erythemtous airways.    Complications/Tolerance None; patient tolerated the procedure well. Chest X-ray is not needed post procedure.   EBL none   Specimen(s) BAL sent for cell count, cytology, gram stain, culture  Lenice Llamas, MD [facility] [place] 07/14/2021 3:35 PM Pager: see AMION  If no response to pager, please call critical care on call (see AMION) until 7pm After 7:00 pm call Elink

## 2021-07-14 NOTE — Progress Notes (Signed)
ABG obtained on ventilator settings of VT: 420, RR: 24, FIO2: 40%, and PEEP: 5.0.  Results given to MD.  No further changes.     Latest Reference Range & Units 07/14/21 11:23  Sample type  ARTERIAL  pH, Arterial 7.350 - 7.450  7.283 (L)  pCO2 arterial 32.0 - 48.0 mmHg 50.5 (H)  pO2, Arterial 83.0 - 108.0 mmHg 75 (L)  TCO2 22 - 32 mmol/L 26  Acid-base deficit 0.0 - 2.0 mmol/L 3.0 (H)  Bicarbonate 20.0 - 28.0 mmol/L 24.1  O2 Saturation % 93.0  Patient temperature  97.5 F  Collection site  RADIAL, ALLEN'S TEST ACCEPTABLE

## 2021-07-14 NOTE — Progress Notes (Signed)
[employer] Progress Note Patient Name: Cynthia [name] DOB: 03-16-1951 MRN: [id number]   Date of Service  07/14/2021  HPI/Events of Note  Hypokalemia - K+ = 2.9 and Creatinine = 1.52.  eICU Interventions  Will replace K+.      Intervention Category Major Interventions: Electrolyte abnormality - evaluation and management  Muzammil Bruins [name] 07/14/2021, 6:39 AM

## 2021-07-15 ENCOUNTER — Inpatient Hospital Stay (HOSPITAL_COMMUNITY): Payer: Medicare Other

## 2021-07-15 DIAGNOSIS — T17908D Unspecified foreign body in respiratory tract, part unspecified causing other injury, subsequent encounter: Secondary | ICD-10-CM

## 2021-07-15 LAB — CULTURE, BLOOD (ROUTINE X 2)
Culture: NO GROWTH
Culture: NO GROWTH

## 2021-07-15 LAB — GLUCOSE, CAPILLARY
Glucose-Capillary: 206 mg/dL — ABNORMAL HIGH (ref 70–99)
Glucose-Capillary: 145 mg/dL — ABNORMAL HIGH (ref 70–99)
Glucose-Capillary: 155 mg/dL — ABNORMAL HIGH (ref 70–99)
Glucose-Capillary: 163 mg/dL — ABNORMAL HIGH (ref 70–99)
Glucose-Capillary: 112 mg/dL — ABNORMAL HIGH (ref 70–99)
Glucose-Capillary: 112 mg/dL — ABNORMAL HIGH (ref 70–99)

## 2021-07-15 LAB — BASIC METABOLIC PANEL
Anion gap: 6 (ref 5–15)
Anion gap: 6 (ref 5–15)
BUN: 48 mg/dL — ABNORMAL HIGH (ref 8–23)
BUN: 50 mg/dL — ABNORMAL HIGH (ref 8–23)
CO2: 23 mmol/L (ref 22–32)
CO2: 23 mmol/L (ref 22–32)
Calcium: 9.1 mg/dL (ref 8.9–10.3)
Calcium: 9.3 mg/dL (ref 8.9–10.3)
Chloride: 121 mmol/L — ABNORMAL HIGH (ref 98–111)
Chloride: 119 mmol/L — ABNORMAL HIGH (ref 98–111)
Creatinine, Ser: 1.41 mg/dL — ABNORMAL HIGH (ref 0.44–1.00)
Creatinine, Ser: 1.45 mg/dL — ABNORMAL HIGH (ref 0.44–1.00)
GFR, Estimated: 41 mL/min — ABNORMAL LOW (ref >60)
GFR, Estimated: 39 mL/min — ABNORMAL LOW (ref >60)
Glucose, Bld: 219 mg/dL — ABNORMAL HIGH (ref 70–99)
Glucose, Bld: 187 mg/dL — ABNORMAL HIGH (ref 70–99)
Potassium: 3.8 mmol/L (ref 3.5–5.1)
Potassium: 3.6 mmol/L (ref 3.5–5.1)
Sodium: 150 mmol/L — ABNORMAL HIGH (ref 135–145)
Sodium: 148 mmol/L — ABNORMAL HIGH (ref 135–145)

## 2021-07-15 LAB — CBC
HCT: 26 % — ABNORMAL LOW (ref 36.0–46.0)
Hemoglobin: 8.7 g/dL — ABNORMAL LOW (ref 12.0–15.0)
MCH: 28.2 pg (ref 26.0–34.0)
MCHC: 33.5 g/dL (ref 30.0–36.0)
MCV: 84.4 fL (ref 80.0–100.0)
Platelets: 157 10*3/uL (ref 150–400)
RBC: 3.08 MIL/uL — ABNORMAL LOW (ref 3.87–5.11)
RDW: 17.7 % — ABNORMAL HIGH (ref 11.5–15.5)
WBC: 14.6 10*3/uL — ABNORMAL HIGH (ref 4.0–10.5)
nRBC: 2.3 % — ABNORMAL HIGH (ref 0.0–0.2)

## 2021-07-15 MED ORDER — ENOXAPARIN SODIUM 40 MG/0.4ML IJ SOSY
40.0000 mg | PREFILLED_SYRINGE | INTRAMUSCULAR | Status: DC
  Administered 2021-07-15 – 2021-07-16 (×2): 40 mg via SUBCUTANEOUS
  Filled 2021-07-15 (×2): qty 0.4

## 2021-07-15 MED ORDER — ORAL CARE MOUTH RINSE
15.0000 mL | Freq: Two times a day (BID) | OROMUCOSAL | Status: DC
  Administered 2021-07-15 – 2021-07-16 (×2): 15 mL via OROMUCOSAL

## 2021-07-15 MED ORDER — FREE WATER
400.0000 mL | Status: DC
  Administered 2021-07-15 – 2021-07-19 (×16): 400 mL

## 2021-07-15 MED ORDER — INSULIN DETEMIR 100 UNIT/ML ~~LOC~~ SOLN
10.0000 [IU] | Freq: Two times a day (BID) | SUBCUTANEOUS | Status: DC
  Administered 2021-07-15 – 2021-07-16 (×4): 10 [IU] via SUBCUTANEOUS
  Filled 2021-07-15 (×6): qty 0.1

## 2021-07-15 NOTE — Progress Notes (Signed)
° °  NAME:  Cynthia [name], MRN:  [id number], DOB:  October 18, 1950, LOS: 7 ADMISSION DATE:  07/09/2021, CONSULTATION DATE:  07/15/2021 REFERRING MD:  [facility], CHIEF COMPLAINT:  respiratory failure   History of Present Illness:  Cynthia [name] is a 71 y.o. woman with past medical history of tobacco use disorder. Presented to [facility] with hypoxemia, fevers, fatigue.  She has history of COPD.  Started on therapy for sepsis from community acquired pneumonia.  Needed Bipap and high flow oxygen.  PCCM consulted to assist with respiratory management.  Pertinent  Medical History  GERD, HLD, Migraine headaches, Anemia, Anxiety, OA, Depression, HCV, HTN, PTSD, Cocaine abuse in remission, Chronic pain  Significant Hospital Events: Including procedures, antibiotic start and stop dates in addition to other pertinent events   12/30 Admit 12/31 start intermittent Bipap use, start precedex 01/01 GI consult for melena 01/03 increased dyspnea/hypoxia; start solumedrol; altered mental status with MRI showing multifocal strokes 01/04 intubated, start peripheral pressors, transferred to [facility] hospital  Interim History / Subjective:  Sedated intubated  Objective   Blood pressure (!) 105/49, pulse (!) 51, temperature (!) 97.5 F (36.4 C), temperature source Oral, resp. rate 19, height _0  (1.6 m), weight 60.6 kg, SpO2 100 %.    Vent Mode: PRVC FiO2 (%):  [40 %] 40 % Set Rate:  [24 bmp] 24 bmp Vt Set:  [420 mL] 420 mL PEEP:  [5 cmH20] 5 cmH20 Plateau Pressure:  [16 cmH20-22 cmH20] 16 cmH20   Intake/Output Summary (Last 24 hours) at 07/15/2021 2330 Last data filed at 07/15/2021 0600 Gross per 24 hour  Intake 1900.04 ml  Output 1350 ml  Net 550.04 ml    Filed Weights   07/13/21 0442 07/14/21 0330 07/15/21 0402  Weight: 58.5 kg 58.2 kg 60.6 kg    Examination: No distress on vent Lungs clear Triggering vent No edema Withdraws x 4 but not following commands  Cr/Na stable CBC stable   Resolved  Hospital Problem list     Assessment & Plan:  Acute hypoxemic respiratory failure- no response to diuretics or Abx, previous episode responded to steroids, ?AIP or similar, BAL 0/7/62 Acute metabolic encephalopathy 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 Circulatory vs septic shock- low dose levophed, question sedation effect Hypernatremia Hyperglycemia AKI, non oliguric- stable  LE duplex neg  Increase free water Wean sedation, SAT/SBT F/u BAL data Continue IV steroids GDMT per neurology Start lovenox ppx Add levemir with SSI Guarded prognosis  Best Practice (right click and "Reselect all SmartList Selections" daily)   Diet/type: NPO w/ meds via tube DVT prophylaxis: prophylactic heparin  GI prophylaxis: PPI Lines: N/A Foley:  N/A Code Status:  full code Last date of multidisciplinary goals of care discussion [1/5 updated daughter [name] over the phone, continue full measures]  Patient critically ill due to shock, resp failure Interventions to address this today vent, pressor titration Risk of deterioration without these interventions is high  I personally spent 33 minutes providing critical care not including any separately billable procedures  Erskine Emery MD [facility]  Prefer epic messenger for cross cover needs If after hours, please call E-link

## 2021-07-15 NOTE — Progress Notes (Signed)
TCD bubble study has been completed.   Preliminary results in CV Proc.   Jinny Blossom Rigel Filsinger 07/15/2021 3:13 PM

## 2021-07-15 NOTE — Progress Notes (Signed)
STROKE TEAM PROGRESS NOTE   INTERVAL HISTORY No family is at the bedside.  Patient was extubated this morning and seems to be breathing all right.  She is drowsy but easily arousable.  She follows simple commands is mute and not able to speak.  She is moving all 4 extremities with right side less than the left.  Vital signs stable. Vitals:   07/15/21 1315 07/15/21 1330 07/15/21 1345 07/15/21 1400  BP: 132/62 (!) 136/57 (!) 144/69 (!) 138/54  Pulse: 66 74 75 65  Resp: (!) 32 20 (!) 22 17  Temp:      TempSrc:      SpO2: 100% 99% 100% 100%  Weight:      Height:       CBC:  Recent Labs  Lab 07/14/21 0354 07/14/21 0424 07/14/21 1123 07/15/21 0618  WBC 14.8*  --   --  14.6*  HGB 8.9*   < > 10.9* 8.7*  HCT 26.7*   < > 32.0* 26.0*  MCV 83.7  --   --  84.4  PLT 166  --   --  157   < > = values in this interval not displayed.   Basic Metabolic Panel:  Recent Labs  Lab 07/09/21 0534 07/09/21 2149 07/12/21 0359 07/13/21 0432 07/14/21 0354 07/14/21 0424 07/15/21 0149 07/15/21 1236  NA 139   < > 144   < > 150*   < > 150* 148*  K 2.9*   < > 3.4*   < > 2.9*   < > 3.8 3.6  CL 110   < > 108   < > 119*   < > 121* 119*  CO2 17*   < > 25   < > 23   < > 23 23  GLUCOSE 137*   < > 88   < > 221*   < > 219* 187*  BUN 12   < > 24*   < > 38*   < > 48* 50*  CREATININE 0.91   < > 1.25*   < > 1.52*   < > 1.41* 1.45*  CALCIUM 8.4*   < > 8.3*   < > 8.5*   < > 9.1 9.3  MG 1.7   < > 2.3  --  2.5*  --   --   --   PHOS 2.5  --   --   --  3.2  --   --   --    < > = values in this interval not displayed.   Lipid Panel:  Recent Labs  Lab 07/13/21 0432  CHOL 78  TRIG 144  HDL 11*  CHOLHDL 7.1  VLDL 29  LDLCALC 38   HgbA1c:  Recent Labs  Lab 07/13/21 0432  HGBA1C 5.5   Urine Drug Screen: No results for input(s): LABOPIA, COCAINSCRNUR, LABBENZ, AMPHETMU, THCU, LABBARB in the last 168 hours.  Alcohol Level No results for input(s): ETH in the last 168 hours.  IMAGING past 24 hours No  results found.  PHYSICAL EXAM  Physical Exam  Constitutional: Frail elderly African-American lady who is not in distress  psych: Affect appropriate to situation Cardiovascular: Normal rate and regular rhythm.  Respiratory: Effort normal, non-labored breathing  Neuro: Mental Status: Drowsy but opens eyes to voice but does not follow any midline commands.  No gaze preference.  Pupils equal 2 mm sluggishly reactive.  Reflexes are sluggish.  Also movements are sluggish. Moves all extremities purposefully, follows commands with extremities Not  following midline commands Cranial Nerves: II: Pupils are equal, round, and reactive to light.   III,IV, VI: EOMI without ptosis or diploplia.  V: Facial sensation is symmetric to temperature VII: No facial asymmetry, no nasolabial fold flattening, obstructed by ET tube VIII: Hearing is intact to voice X: Cough and gag intact XI: Head is midline XII: Tongue is midline, does not protrude on command Motor: Tone is normal. Bulk is poor.  Moves all extremities antigravity, able to briefly hold extremities off the bed left greater than right.  Right upper extremity weakness Sensory: Withdraws to pain in all extremities Coordination/Gait: Deferred    ASSESSMENT/PLAN Cynthia [name] is a 71 y.o. female with history of COPD, HTN, Depression, GERD, HTN, migraines, hx of tobacco use who was admitted to [facility] with SOB with exertion along with nausea and vomiting and a fever of 102.49F. She was found to have Pneumonia, acute GI bleed.  Blood cultures were sent and have so far been negative as well as respiratory culture.  She is on antibiotics including Zosyn and doxycycline.  GI was consulted for the GI bleed, they are deferring the endoscopy until her respiratory status improves. She developed encephalopathy during admission and an MRI Brain demonstrated multple embolic appearing strokes. She was intubated for worsening SOB and mentation.  She was transferred to [facility] for further evaluation and management 07/13/2021.  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  She remains intubated moving all extremities antigravity and following simple extremity commands.  CCM plans to bronch her today.  2D echo, venous Doppler, TCD with bubble pending.   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  Largest infarction in the left posterior frontal cortex. MRA  Motion degraded, no sign of LVO 2D Echo ejection fraction 60%.  Small left-to-right shunt. TCD bubble study pending Venous Duplex LE-negative for DVT  LDL 38 HgbA1c 5.5 VTE prophylaxis - SCDs No antithrombotic prior to admission, now on aspirin 81 mg daily and clopidogrel 75 mg daily.  Therapy recommendations:  pending Disposition:  pending  Hypertension Home meds: Norvasc 10 mg, propranolol 80 mg, Lasix 20 mg Stable Permissive hypertension (OK if < 220/120) but gradually normalize in 5-7 days Long-term BP goal normotensive  Hyperlipidemia Home meds: Atorvastatin 40 mg, resumed in hospital LDL 38, goal < 70 High intensity statin not indicated  Continue statin at discharge  Other Stroke Risk Factors Advanced Age >/= 63  Cigarette smoker, advised to stop smoking Migraines  Other Active Problems Tube feed infusing Acute Hypoxic Respiratory failure-managed by CCM Intubated at [facility] T[place] volume 420, respiratory rate 24, FiO2 40%, PEEP of 5 ABX- doxy and zosyn Leukocytosis- WBC-14.8 COPD continue yupelri, brovana, pulmicort PRN albuterol Hypotension Levophed GI  Bleed CBC daily  H/H- 8.9/26.7 GI consult AKI BUN/Cr. 42/1.54 Hypokalemia K- 3.3 Replace per primary team Hyperglycemia- no diagnosis of DM Ha1C- 5.5 CBG 272 SSI  Hospital day # 7  Patient seen and examined by NP/APP with MD. MD to update note as needed.   Patient presented with pneumonia and altered mental status and subsequently MRI done few days later shows bicerebral embolic infarcts  Etiology to be determined but possibilities include endocarditis given fever on admission and pneumonia versus paradoxical embolism versus atrial fibrillation or other cardiac source.  Recommend aspirin for stroke prevention for now and check TCD bubble study for PFO to further categorize it.  Given advanced age and low ROPE score she is not a candidate for endovascular PFO closure however.  If unyielding consider TEE vegetations from cardiac source of embolism.  Treatment of pneumonia and respiratory failure as per critical care team and extubate as tolerated.  No family available at the bedside for discussion.  Discussed with Dr. Tamala Julian critical care medicine.This patient is critically ill and at significant risk of neurological worsening, death and care requires constant monitoring of vital signs, hemodynamics,respiratory and cardiac monitoring, extensive review of multiple databases, frequent neurological assessment, discussion with family, other specialists and medical decision making of high complexity.I have made any additions or clarifications directly to the above note.This critical care time does not reflect procedure time, or teaching time or supervisory time of PA/NP/Med Resident etc but could involve care discussion time.  I spent 30 minutes of neurocritical care time  in the care of  this patient.     Antony Contras, MD Medical Director [facility] Pager: [phone] 07/15/2021 2:23 PM  To contact Stroke Continuity provider, please refer to [email or web address] After hours, contact General  Neurology

## 2021-07-15 NOTE — Progress Notes (Signed)
[facility] Progress Note Patient Name: Cynthia [name] DOB: 1951-07-04 MRN: [id number]   Date of Service  07/15/2021  HPI/Events of Note  Bedside RN was concerned about patient being restless earlier in the night but after voiding she is much calmer and not having any respiratory difficulty per bedside RN.  eICU Interventions  No intervention.        Kerry Kass Illias Pantano 07/15/2021, 10:48 PM

## 2021-07-16 ENCOUNTER — Inpatient Hospital Stay (HOSPITAL_COMMUNITY): Payer: Medicare Other

## 2021-07-16 DIAGNOSIS — Z930 Tracheostomy status: Secondary | ICD-10-CM

## 2021-07-16 LAB — CBC
HCT: 26.5 % — ABNORMAL LOW (ref 36.0–46.0)
Hemoglobin: 9.1 g/dL — ABNORMAL LOW (ref 12.0–15.0)
MCH: 28.1 pg (ref 26.0–34.0)
MCHC: 34.3 g/dL (ref 30.0–36.0)
MCV: 81.8 fL (ref 80.0–100.0)
Platelets: 153 10*3/uL (ref 150–400)
RBC: 3.24 MIL/uL — ABNORMAL LOW (ref 3.87–5.11)
RDW: 17.2 % — ABNORMAL HIGH (ref 11.5–15.5)
WBC: 17.4 10*3/uL — ABNORMAL HIGH (ref 4.0–10.5)
nRBC: 3.2 % — ABNORMAL HIGH (ref 0.0–0.2)

## 2021-07-16 LAB — CULTURE, RESPIRATORY W GRAM STAIN: Culture: NO GROWTH

## 2021-07-16 LAB — BASIC METABOLIC PANEL
Anion gap: 11 (ref 5–15)
BUN: 48 mg/dL — ABNORMAL HIGH (ref 8–23)
CO2: 22 mmol/L (ref 22–32)
Calcium: 9.6 mg/dL (ref 8.9–10.3)
Chloride: 123 mmol/L — ABNORMAL HIGH (ref 98–111)
Creatinine, Ser: 1.16 mg/dL — ABNORMAL HIGH (ref 0.44–1.00)
GFR, Estimated: 51 mL/min — ABNORMAL LOW (ref >60)
Glucose, Bld: 110 mg/dL — ABNORMAL HIGH (ref 70–99)
Potassium: 3.2 mmol/L — ABNORMAL LOW (ref 3.5–5.1)
Sodium: 156 mmol/L — ABNORMAL HIGH (ref 135–145)

## 2021-07-16 LAB — GLUCOSE, CAPILLARY
Glucose-Capillary: 115 mg/dL — ABNORMAL HIGH (ref 70–99)
Glucose-Capillary: 108 mg/dL — ABNORMAL HIGH (ref 70–99)
Glucose-Capillary: 149 mg/dL — ABNORMAL HIGH (ref 70–99)
Glucose-Capillary: 127 mg/dL — ABNORMAL HIGH (ref 70–99)
Glucose-Capillary: 128 mg/dL — ABNORMAL HIGH (ref 70–99)
Glucose-Capillary: 95 mg/dL (ref 70–99)

## 2021-07-16 LAB — BLOOD GAS, ARTERIAL
Acid-base deficit: 3.5 mmol/L — ABNORMAL HIGH (ref 0.0–2.0)
Bicarbonate: 20.5 mmol/L (ref 20.0–28.0)
Drawn by: 35849
FIO2: 80
O2 Saturation: 92.4 %
Patient temperature: 37
pCO2 arterial: 33.9 mmHg (ref 32.0–48.0)
pH, Arterial: 7.399 (ref 7.350–7.450)
pO2, Arterial: 72.2 mmHg — ABNORMAL LOW (ref 83.0–108.0)

## 2021-07-16 LAB — PHOSPHORUS: Phosphorus: 2 mg/dL — ABNORMAL LOW (ref 2.5–4.6)

## 2021-07-16 LAB — MAGNESIUM: Magnesium: 2.1 mg/dL (ref 1.7–2.4)

## 2021-07-16 MED ORDER — ETOMIDATE 2 MG/ML IV SOLN
20.0000 mg | Freq: Once | INTRAVENOUS | Status: AC
  Administered 2021-07-16: 20 mg via INTRAVENOUS
  Filled 2021-07-16: qty 10

## 2021-07-16 MED ORDER — ENOXAPARIN SODIUM 40 MG/0.4ML IJ SOSY: 40.0000 mg | PREFILLED_SYRINGE | INTRAMUSCULAR | Status: DC

## 2021-07-16 MED ORDER — NOREPINEPHRINE 4 MG/250ML-% IV SOLN
INTRAVENOUS | Status: AC
  Administered 2021-07-16: 6 ug/min via INTRAVENOUS
  Filled 2021-07-16: qty 250

## 2021-07-16 MED ORDER — ACETAMINOPHEN 160 MG/5ML PO SOLN
1000.0000 mg | Freq: Four times a day (QID) | ORAL | Status: DC
  Administered 2021-07-17 – 2021-07-19 (×10): 1000 mg
  Filled 2021-07-16 (×10): qty 40.6

## 2021-07-16 MED ORDER — ASPIRIN 325 MG PO TABS: 325.0000 mg | ORAL_TABLET | Freq: Every day | ORAL | Status: DC

## 2021-07-16 MED ORDER — MIDODRINE HCL 5 MG PO TABS
5.0000 mg | ORAL_TABLET | Freq: Three times a day (TID) | ORAL | Status: DC
  Administered 2021-07-17: 5 mg
  Filled 2021-07-16: qty 1

## 2021-07-16 MED ORDER — DEXTROSE 5 % IV SOLN: INTRAVENOUS | Status: DC

## 2021-07-16 MED ORDER — NOREPINEPHRINE 4 MG/250ML-% IV SOLN
0.0000 ug/min | INTRAVENOUS | Status: DC
  Administered 2021-07-17: 8 ug/min via INTRAVENOUS
  Filled 2021-07-16 (×2): qty 250

## 2021-07-16 MED ORDER — PHENYLEPHRINE 40 MCG/ML (10ML) SYRINGE FOR IV PUSH (FOR BLOOD PRESSURE SUPPORT)
PREFILLED_SYRINGE | INTRAVENOUS | Status: AC
  Filled 2021-07-16: qty 20

## 2021-07-16 MED ORDER — POTASSIUM PHOSPHATES 15 MMOLE/5ML IV SOLN
15.0000 mmol | Freq: Once | INTRAVENOUS | Status: AC
  Administered 2021-07-16: 15 mmol via INTRAVENOUS
  Filled 2021-07-16: qty 5

## 2021-07-16 MED ORDER — MIDODRINE HCL 5 MG PO TABS
5.0000 mg | ORAL_TABLET | Freq: Three times a day (TID) | ORAL | Status: DC
  Administered 2021-07-16: 5 mg via ORAL
  Filled 2021-07-16: qty 1

## 2021-07-16 MED ORDER — ORAL CARE MOUTH RINSE
15.0000 mL | OROMUCOSAL | Status: DC
  Administered 2021-07-16 – 2021-07-19 (×24): 15 mL via OROMUCOSAL

## 2021-07-16 MED ORDER — GERHARDT'S BUTT CREAM
TOPICAL_CREAM | CUTANEOUS | Status: DC | PRN
  Filled 2021-07-16: qty 1

## 2021-07-16 MED ORDER — CHLORHEXIDINE GLUCONATE 0.12% ORAL RINSE (MEDLINE KIT)
15.0000 mL | Freq: Two times a day (BID) | OROMUCOSAL | Status: DC
  Administered 2021-07-16 – 2021-07-19 (×6): 15 mL via OROMUCOSAL

## 2021-07-16 MED ORDER — FENTANYL CITRATE (PF) 100 MCG/2ML IJ SOLN
200.0000 ug | Freq: Once | INTRAMUSCULAR | Status: AC
  Administered 2021-07-16: 50 ug via INTRAVENOUS
  Filled 2021-07-16: qty 4

## 2021-07-16 MED ORDER — DEXMEDETOMIDINE HCL IN NACL 400 MCG/100ML IV SOLN
0.4000 ug/kg/h | INTRAVENOUS | Status: DC
  Administered 2021-07-16: 0.4 ug/kg/h via INTRAVENOUS
  Filled 2021-07-16: qty 100

## 2021-07-16 MED ORDER — FENTANYL 2500MCG IN NS 250ML (10MCG/ML) PREMIX INFUSION
0.0000 ug/h | INTRAVENOUS | Status: DC
  Administered 2021-07-16: 25 ug/h via INTRAVENOUS
  Administered 2021-07-17: 250 ug/h via INTRAVENOUS
  Administered 2021-07-17: 200 ug/h via INTRAVENOUS
  Administered 2021-07-18: 75 ug/h via INTRAVENOUS
  Filled 2021-07-16 (×5): qty 250

## 2021-07-16 MED ORDER — FENTANYL CITRATE (PF) 100 MCG/2ML IJ SOLN
50.0000 ug | INTRAMUSCULAR | Status: DC | PRN
  Administered 2021-07-16 (×2): 50 ug via INTRAVENOUS
  Filled 2021-07-16 (×2): qty 2

## 2021-07-16 MED ORDER — SODIUM CHLORIDE 0.45 % IV SOLN: INTRAVENOUS | Status: DC

## 2021-07-16 MED ORDER — MIDAZOLAM HCL 2 MG/2ML IJ SOLN
5.0000 mg | Freq: Once | INTRAMUSCULAR | Status: AC
  Administered 2021-07-16: 4 mg via INTRAVENOUS
  Filled 2021-07-16: qty 6

## 2021-07-16 MED ORDER — ROCURONIUM BROMIDE 10 MG/ML (PF) SYRINGE
80.0000 mg | PREFILLED_SYRINGE | Freq: Once | INTRAVENOUS | Status: AC
  Administered 2021-07-16: 50 mg via INTRAVENOUS
  Filled 2021-07-16: qty 10

## 2021-07-16 MED ORDER — ASPIRIN 300 MG RE SUPP
300.0000 mg | Freq: Every day | RECTAL | Status: DC
  Filled 2021-07-16: qty 1

## 2021-07-16 MED ORDER — LACTATED RINGERS IV BOLUS
500.0000 mL | Freq: Once | INTRAVENOUS | Status: AC
  Administered 2021-07-16: 500 mL via INTRAVENOUS

## 2021-07-16 MED ORDER — LACTATED RINGERS IV BOLUS: 500.0000 mL | Freq: Once | INTRAVENOUS | Status: AC

## 2021-07-16 NOTE — Progress Notes (Signed)
Patient showing increased restlessness and increased work of breathing throughout the night. E-link was contacted by this nurse around 2200, 0100, and 0300 with no new interventions or orders given. This nurse then followed up with E-link again around 0530 requesting orders with no new orders ever given. Patient had increased RR, increased HR, and increased O2 requirements. Will relay patient status to oncoming dayshift MD.    0710- Dr. Tamala Julian aware of patient's status.

## 2021-07-16 NOTE — Progress Notes (Signed)
[facility] Progress Note Patient Name: Cynthia [name] DOB: [birth year] MRN: [id number]   Date of Service  07/16/2021  HPI/Events of Note  Patient resting quietly in bed, saturation 100 %, BP 152/62, heart rate 91, RR 18.  eICU Interventions  No intervention indicated.        Kerry Kass Wynter Isaacs 07/16/2021, 2:49 AM

## 2021-07-16 NOTE — Plan of Care (Signed)
°  Interdisciplinary Goals of Care Family Meeting   Date carried out: 07/16/2021  Location of the meeting: Bedside  Member's involved: Physician, Bedside Registered Nurse, and Family Member or next of kin  Durable Power of Attorney or acting medical decision maker: Jennette Dubin daughter    Discussion: We discussed goals of care for Cynthia [name] .   Discussed how difficult she has been to wean from ventilator with new strokes and safest way forward would be tracheostomy to give her body and brain time to heal.  Alternatively we can see how she does without ventilator and if she fails focus on her comfort.  We came to decision to perform tracheostomy with eventual goal being rehab, LTACH, or home.  Daughter does not want her mother in a SNF.  So if we get to point over next week or two where it does not look like she will wean from ventilator and only option is SNF, we may transition to comfort.  Code status: Full Code  Disposition: Continue current acute care  Time spent for the meeting: 15 minutes    Candee Furbish, MD  07/16/2021, 5:13 PM

## 2021-07-16 NOTE — Procedures (Signed)
Diagnostic Bronchoscopy  Cynthia [name]  [id number]  Jan 24, 1951  Date:07/16/21  Time:5:13 PM   Provider Performing:Brooke Moshe Cipro  Dr Tamala Julian at bedside  Procedure: Diagnostic Bronchoscopy [phone]  Indication(s) Assist with direct visualization of tracheostomy placement BAL of RLL  Consent Risks of the procedure as well as the alternatives and risks of each were explained to the patient and/or caregiver.  Consent for the procedure was obtained.   Anesthesia See separate tracheostomy note   Time Out Verified patient identification, verified procedure, site/side was marked, verified correct patient position, special equipment/implants available, medications/allergies/relevant history reviewed, required imaging and test results available.   Sterile Technique Usual hand hygiene, masks, gowns, and gloves were used   Procedure Description Bronchoscope advanced through endotracheal tube and into airway.  BAL with 10-15 ml returned in RLL.  To be sent for culture and GS   After suctioning out tracheal secretions, bronchoscope used to provide direct visualization of tracheostomy placement.   Complications/Tolerance None; patient tolerated the procedure well.   EBL None  Specimen(s) None     Kennieth Rad, ACNP [facility] 07/16/2021, 5:15 PM  See Amion for pager If no response to pager, please call PCCM consult pager After 7:00 pm call Elink

## 2021-07-16 NOTE — Progress Notes (Signed)
STROKE TEAM PROGRESS NOTE   INTERVAL HISTORY RN at the bedside. Pt extubated this am on [place] but still not open eyes on voice or following commands, CCM concerning for possible re-intubation if needed. Family GOC discussion is underway. Pt moving extremities on pain but weaker on the RUE. Na up to 156 from 148, will add IVF. Minimal GIB with positive fecal occult blood, will add ASA PR.    Vitals:   07/16/21 0830 07/16/21 0833 07/16/21 0900 07/16/21 1105  BP: (!) 143/71  135/84   Pulse: (!) 128  (!) 108   Resp: 20  (!) 27   Temp:    98.1 F (36.7 C)  TempSrc:    Oral  SpO2: (!) 74% 100% 93%   Weight:      Height:       CBC:  Recent Labs  Lab 07/15/21 0618 07/16/21 0121  WBC 14.6* 17.4*  HGB 8.7* 9.1*  HCT 26.0* 26.5*  MCV 84.4 81.8  PLT 157 893   Basic Metabolic Panel:  Recent Labs  Lab 07/14/21 0354 07/14/21 0424 07/15/21 1236 07/16/21 0121  NA 150*   < > 148* 156*  K 2.9*   < > 3.6 3.2*  CL 119*   < > 119* 123*  CO2 23   < > 23 22  GLUCOSE 221*   < > 187* 110*  BUN 38*   < > 50* 48*  CREATININE 1.52*   < > 1.45* 1.16*  CALCIUM 8.5*   < > 9.3 9.6  MG 2.5*  --   --  2.1  PHOS 3.2  --   --  2.0*   < > = values in this interval not displayed.   Lipid Panel:  Recent Labs  Lab 07/13/21 0432  CHOL 78  TRIG 144  HDL 11*  CHOLHDL 7.1  VLDL 29  LDLCALC 38   HgbA1c:  Recent Labs  Lab 07/13/21 0432  HGBA1C 5.5   Urine Drug Screen: No results for input(s): LABOPIA, COCAINSCRNUR, LABBENZ, AMPHETMU, THCU, LABBARB in the last 168 hours.  Alcohol Level No results for input(s): ETH in the last 168 hours.  IMAGING past 24 hours DG Chest Port 1 View  Result Date: 07/16/2021 CLINICAL DATA:  ARDS. EXAM: PORTABLE CHEST 1 VIEW COMPARISON:  07/14/2021 FINDINGS: 0403 hours. Diffuse interstitial and hazy alveolar opacity is stable to minimally improved in the interval. No pneumothorax or pleural effusion. Endotracheal tube and NG tube have been removed since prior.  Telemetry leads overlie the chest. IMPRESSION: 1. Interval extubation. 2. Stable to mildly improved interstitial and basilar alveolar opacity. Electronically Signed   By: Misty Stanley M.D.   On: 07/16/2021 05:01   VAS [place] TRANSCRANIAL DOPPLER W BUBBLES  Result Date: 07/15/2021  Transcranial Doppler with Bubble Patient Name:  FREYA ZOBRIST  Date of Exam:   07/15/2021 Medical Rec #: [id number]          Accession #:    [id number] Date of Birth: Jun 26, 1951          Patient Gender: F Patient Age:   71 years Exam Location:  [facility] Procedure:      VAS [place] TRANSCRANIAL DOPPLER W BUBBLES Referring Phys: Janine Ores --------------------------------------------------------------------------------  Indications: Stroke. Comparison Study: no prior Performing Technologist: Archie Patten RVS  Examination Guidelines: A complete evaluation includes B-mode imaging, spectral Doppler, color Doppler, and power Doppler as needed of all accessible portions of each vessel. Bilateral testing is considered an integral part of a  complete examination. Limited examinations for reoccurring indications may be performed as noted.  Summary: No HITS at rest or during Valsalva. Clinically insignificant. Negative transcranial Doppler Bubble study with no evidence of right to left intracardiac communication.  A vascular evaluation was performed. The right middle cerebral artery was studied. An IV was inserted into the patient's right forearm . Verbal informed consent was obtained.  *See table(s) above for TCD measurements and observations.  Diagnosing physician: Antony Contras MD Electronically signed by Antony Contras MD on 07/15/2021 at 6:05:33 PM.    Final     PHYSICAL EXAM  Temp:  [97.6 F (36.4 C)-98.3 F (36.8 C)] 98.1 F (36.7 C) (01/07 1105) Pulse Rate:  [62-136] 129 (01/07 1200) Resp:  [9-35] 30 (01/07 1300) BP: (113-173)/(44-142) 136/57 (01/07 1300) SpO2:  [74 %-100 %] 89 % (01/07 1200) FiO2 (%):  [80 %-100 %] 90 %  (01/07 1200) Weight:  [58.2 kg] 58.2 kg (01/07 0442)  General - thin built, well developed, on [place], not open eyes on voice or following commands.  Ophthalmologic - fundi not visualized due to noncooperation.  Cardiovascular - Regular rhythm and rate.  Neuro - eyes closed, not open on voice, grimace with pain stimulation but not open eyes. Non verbal, not answer questions. With forced eye opening, pupil equal but sluggish to light, eyes midline, not blinking to visual threat. Positive corneals. Intermittent coughing. Facial symmetrical. With pain stimulation, BLE withdraw equally. LUE localize to pain and against gravity. RUE mild withdraw to pain but not against gravity. Sensation, coordination and gait not tested.   ASSESSMENT/PLAN Ms. AYLYN WENZLER is a 71 y.o. female with history of COPD, HTN, Depression, GERD, HTN, migraines, hx of tobacco use who was admitted to [facility] with SOB with exertion along with nausea and vomiting and a fever of 102.26F. She was found to have Pneumonia and acute GI bleed. Blood cultures were sent and have so far been negative as well as respiratory culture.  She is on antibiotics including Zosyn and doxycycline.  GI was consulted for the GI bleed, they are deferring the endoscopy until her respiratory status improves. She developed encephalopathy during admission and an MRI Brain demonstrated multple embolic appearing strokes. She was intubated for worsening SOB and mentation. She was transferred to [facility] for further evaluation and management 07/13/2021.  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  She remains intubated moving all extremities antigravity and following simple extremity commands.  CCM plans to bronch her today.  2D echo, venous Doppler, TCD with bubble pending.   Stroke:  embolic shower in b/l anterior and posterior distribution, etiology unclear likely secondary to a cardioembolic  source CT head -There is a new small focus of low attenuation in the left frontal cortex suggesting possible recent or old infarct in the left MCA distribution MRI-  multiple acute and subacute infarcts in the bilateral cerebral and cerebellar hemispheres.  Largest infarction in the left posterior frontal cortex. MRA  Motion degraded, no sign of LVO Carotid Doppler unremarkable 2D Echo EF 60-65%.  Small left-to-right shunt. TCD bubble study - No HITS at rest or during Valsalva.Bubble study with no evidence of right to left intracardiac communication.  Venous Duplex LE-negative for DVT  Recommend TEE to further evaluate PFO and to rule out endocarditis Repeat MRI if mental status no improvement over time. LDL 38 HgbA1c 5.5 VTE prophylaxis - SCDs No antithrombotic prior to admission, now on aspirin 300 PR  Therapy recommendations:  pending Disposition:  pending  Fever  Leukocytosis Pneumonia Tmax 102.4 PTA -> afebrile WBC 14.6->17.4 Initial CXR concerning for diffuse bilateral lung infiltrates Management per CCM Was on Zosyn and doxycycline Recommend TEE to rule out endocarditis  Respiratory failure Intubated and extubated this morning Still has respite distress S/p tracheostomy 07/16/2021 with Dr. Tamala Julian  AMS Likely multifactorial encephalopathy May be related to stroke, AKI, fever, leukocytosis, GI bleeding EEG pending If no improvement over time, repeat MRI brain  GIB Dysphagia Dark stool please fecal occult blood positive However, no significant bleeding so far GI on board - 07/10/21 -> monitor H&H and consider transfusion if hemoglobin drops below 8 g. Esophagogastroduodenoscopy when patient deemed to be stable from respiratory standpoint. H/H- 8.9/26.7->9.1/26.5  Continue aspirin 300 PR for now Tube feeding and free water on hold Put on IV fluid  Hypertension Home meds: Norvasc 10 mg, propranolol 80 mg, Lasix 20 mg Stable Long-term BP goal  normotensive  Hyperlipidemia Home meds: Atorvastatin 40 mg, resumed in hospital LDL 38, goal < 70 High intensity statin not indicated given LDL at goal Continue statin at discharge  Tobacco abuse Current smoker Smoking cessation counseling will be provided  Other Stroke Risk Factors Advanced Age >/= 68  Migraines  Other Active Problems Hypernatremia - Na 148->156 was on free water and tube feeding, currently on hold, will add IV fluid AKI Cr.  1.45 ->1.54->1.16   Hospital day # 8  This patient is critically ill due to altered mental status, respiratory failure, fever and leukocytosis, embolic stroke and at significant risk of neurological worsening, death form recurrent stroke, hemorrhagic transformation, seizure,. This patient's care requires constant monitoring of vital signs, hemodynamics, respiratory and cardiac monitoring, review of multiple databases, neurological assessment, discussion with family, other specialists and medical decision making of high complexity. I spent 35 minutes of neurocritical care time in the care of this patient.  I discussed with Dr. Tamala Julian CCM.  Rosalin Hawking, MD PhD Stroke Neurology 07/17/2021 12:23 AM   To contact Stroke Continuity provider, please refer to [email or web address] After hours, contact General Neurology

## 2021-07-16 NOTE — Progress Notes (Signed)
[facility] ADULT ICU REPLACEMENT PROTOCOL   The patient does apply for the [facility] Adult ICU Electrolyte Replacment Protocol based on the criteria listed below:   1.Exclusion criteria: TCTS patients, ECMO patients, and Dialysis patients 2. Is GFR >/= 30 ml/min? Yes.    Patient's GFR today is 51 3. Is SCr </= 2? Yes.   Patient's SCr is 1.16 mg/dL 4. Did SCr increase >/= 0.5 in 24 hours? No. 5.Pt's weight >40kg  Yes.   6. Abnormal electrolyte(s): K+ 3.2, Phos 2.0  7. Electrolytes replaced per protocol 8.  Call MD STAT for K+ </= 2.5, Phos </= 1, or Mag </= 1 Physician:  Dr York Ram, Elfredia Nevins 07/16/2021 5:43 AM

## 2021-07-16 NOTE — Procedures (Signed)
Intubation Procedure Note  KORYNN KENEDY  [id number]  1950-12-10  Date:07/16/21  Time:4:53 PM   Provider Performing:Tifani Dack T Carlis Abbott    Procedure: Intubation (31500)  Indication(s) Respiratory Failure  Consent Risks of the procedure as well as the alternatives and risks of each were explained to the patient and/or caregiver.  Consent for the procedure was obtained and is signed in the bedside chart   Anesthesia  Meds given by RN per Dr.Smtih at bedside   Time Out Verified patient identification, verified procedure, site/side was marked, verified correct patient position, special equipment/implants available, medications/allergies/relevant history reviewed, required imaging and test results available.   Sterile Technique Usual hand hygeine, masks, and gloves were used   Procedure Description Patient positioned in bed supine.  Sedation given as noted above.  Patient was intubated with endotracheal tube using Glidescope.  View was Grade 1 full glottis .  Number of attempts was 1.  Colorimetric CO2 detector was consistent with tracheal placement.   Complications/Tolerance None; patient tolerated the procedure well. Chest X-ray is ordered to verify placement.   EBL Minimal   Specimen(s) None

## 2021-07-16 NOTE — Progress Notes (Addendum)
° °  NAME:  Cynthia [name], MRN:  [id number], DOB:  [birth year], LOS: 8 ADMISSION DATE:  06/13/2021, CONSULTATION DATE:  07/16/2021 REFERRING MD:  [facility], CHIEF COMPLAINT:  respiratory failure   History of Present Illness:  Cynthia [name] is a 71 y.o. woman with past medical history of tobacco use disorder. Presented to [facility] with hypoxemia, fevers, fatigue.  She has history of COPD.  Started on therapy for sepsis from community acquired pneumonia.  Needed Bipap and high flow oxygen.  PCCM consulted to assist with respiratory management.  Pertinent  Medical History  GERD, HLD, Migraine headaches, Anemia, Anxiety, OA, Depression, HCV, HTN, PTSD, Cocaine abuse in remission, Chronic pain  Significant Hospital Events: Including procedures, antibiotic start and stop dates in addition to other pertinent events   12/30 Admit 12/31 start intermittent Bipap use, start precedex 01/01 GI consult for melena 01/03 increased dyspnea/hypoxia; start solumedrol; altered mental status with MRI showing multifocal strokes 01/04 intubated, start peripheral pressors, transferred to [facility] hospital 1/6 extubation trial  Interim History / Subjective:  O2 needs worsening. Aphasic. Not following commands.  Objective   Blood pressure (!) 131/109, pulse (!) 108, temperature 97.6 F (36.4 C), temperature source Oral, resp. rate (!) 21, height _0  (1.6 m), weight 58.2 kg, SpO2 97 %.    Vent Mode: PSV;CPAP FiO2 (%):  [45 %-100 %] 100 % PEEP:  [5 cmH20] 5 cmH20 Pressure Support:  [8 cmH20] 8 cmH20   Intake/Output Summary (Last 24 hours) at 07/16/2021 0719 Last data filed at 07/16/2021 0400 Gross per 24 hour  Intake 211.91 ml  Output 1150 ml  Net -938.09 ml    Filed Weights   07/14/21 0330 07/15/21 0402 07/16/21 0442  Weight: 58.2 kg 60.6 kg 58.2 kg    Examination: Sitting straight up in bed tachypneic Lungs with rhonci bilaterally, occasional accessory muscle use Ext without edema Cranial nerves  intact, moves ext purposefully but not to command, right side seems weaker  Worsening hypernatremia Cr better   Resolved Hospital Problem list     Assessment & Plan:  Acute hypoxemic respiratory failure- no response to diuretics or Abx, previous episode responded to steroids, ?AIP or similar, BAL 07/14/20; NGTD Acute metabolic encephalopathy 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   Continue steroids, f/u BAL data  If we are going full scope, needs reintubation and consideration for trach/LTACH  Best Practice (right click and "Reselect all SmartList Selections" daily)   Diet/type: NPO DVT prophylaxis: prophylactic heparin  GI prophylaxis: PPI Lines: N/A Foley:  N/A Code Status:  full code Last date of multidisciplinary goals of care discussion [today]  Patient critically ill due to resp failure Interventions to address this today [place] discussions, possible intubation/trach Risk of deterioration without these interventions is high  I personally spent 31 minutes providing critical care not including any separately billable procedures  Erskine Emery MD [facility]  Prefer epic messenger for cross cover needs If after hours, please call E-link

## 2021-07-16 NOTE — Procedures (Signed)
Percutaneous Tracheostomy Procedure Note   GUINEVERE STEPHENSON  [id number]  05-31-1951  Date:07/16/21  Time:5:11 PM   Provider Performing:Nivea Wojdyla C Tamala Julian  Procedure: Percutaneous Tracheostomy with Bronchoscopic Guidance (49702)  Indication(s) Persistent resp failure  Consent Risks of the procedure as well as the alternatives and risks of each were explained to the patient and/or caregiver.  Consent for the procedure was obtained.  Anesthesia Etomidate, Versed, Fentanyl, Rocuronium   Time Out Verified patient identification, verified procedure, site/side was marked, verified correct patient position, special equipment/implants available, medications/allergies/relevant history reviewed, required imaging and test results available.   Sterile Technique Maximal sterile technique including sterile barrier drape, hand hygiene, sterile gown, sterile gloves, mask, hair covering.    Procedure Description Appropriate anatomy identified by palpation.  Patient's neck prepped and draped in sterile fashion.  1% lidocaine with epinephrine was used to anesthetize skin overlying neck.  1.5cm incision made and blunt dissection performed until tracheal rings could be easily palpated.   Then a size 7-5 Shiley tracheostomy was placed under bronchoscopic visualization using usual Seldinger technique and serial dilation.   Bronchoscope confirmed placement above the carina.  Tracheostomy was sutured in place with adhesive pad to protect skin under pressure.    Patient connected to ventilator.   Complications/Tolerance None; patient tolerated the procedure well. Chest X-ray is ordered to confirm no post-procedural complication.   EBL Minimal   Specimen(s) None

## 2021-07-17 ENCOUNTER — Inpatient Hospital Stay (HOSPITAL_COMMUNITY): Payer: Medicare Other

## 2021-07-17 ENCOUNTER — Inpatient Hospital Stay: Payer: Self-pay

## 2021-07-17 DIAGNOSIS — L8990 Pressure ulcer of unspecified site, unspecified stage: Secondary | ICD-10-CM | POA: Diagnosis present

## 2021-07-17 LAB — POCT I-STAT 7, (LYTES, BLD GAS, ICA,H+H)
Acid-Base Excess: 1 mmol/L (ref 0.0–2.0)
Acid-base deficit: 1 mmol/L (ref 0.0–2.0)
Bicarbonate: 27 mmol/L (ref 20.0–28.0)
Bicarbonate: 25.2 mmol/L (ref 20.0–28.0)
Calcium, Ion: 1.24 mmol/L (ref 1.15–1.40)
Calcium, Ion: 1.2 mmol/L (ref 1.15–1.40)
HCT: 23 % — ABNORMAL LOW (ref 36.0–46.0)
HCT: 30 % — ABNORMAL LOW (ref 36.0–46.0)
Hemoglobin: 7.8 g/dL — ABNORMAL LOW (ref 12.0–15.0)
Hemoglobin: 10.2 g/dL — ABNORMAL LOW (ref 12.0–15.0)
O2 Saturation: 96 %
O2 Saturation: 96 %
Patient temperature: 98.6
Potassium: 4 mmol/L (ref 3.5–5.1)
Potassium: 3.8 mmol/L (ref 3.5–5.1)
Sodium: 150 mmol/L — ABNORMAL HIGH (ref 135–145)
Sodium: 150 mmol/L — ABNORMAL HIGH (ref 135–145)
TCO2: 29 mmol/L (ref 22–32)
TCO2: 26 mmol/L (ref 22–32)
pCO2 arterial: 69 mmHg (ref 32.0–48.0)
pCO2 arterial: 38.7 mmHg (ref 32.0–48.0)
pH, Arterial: 7.201 — ABNORMAL LOW (ref 7.350–7.450)
pH, Arterial: 7.421 (ref 7.350–7.450)
pO2, Arterial: 99 mmHg (ref 83.0–108.0)
pO2, Arterial: 78 mmHg — ABNORMAL LOW (ref 83.0–108.0)

## 2021-07-17 LAB — BASIC METABOLIC PANEL
Anion gap: 9 (ref 5–15)
BUN: 44 mg/dL — ABNORMAL HIGH (ref 8–23)
CO2: 23 mmol/L (ref 22–32)
Calcium: 8.4 mg/dL — ABNORMAL LOW (ref 8.9–10.3)
Chloride: 121 mmol/L — ABNORMAL HIGH (ref 98–111)
Creatinine, Ser: 1.12 mg/dL — ABNORMAL HIGH (ref 0.44–1.00)
GFR, Estimated: 54 mL/min — ABNORMAL LOW (ref >60)
Glucose, Bld: 106 mg/dL — ABNORMAL HIGH (ref 70–99)
Potassium: 3.7 mmol/L (ref 3.5–5.1)
Sodium: 153 mmol/L — ABNORMAL HIGH (ref 135–145)

## 2021-07-17 LAB — BODY FLUID CELL COUNT WITH DIFFERENTIAL
Eos, Fluid: 1 %
Lymphs, Fluid: 1 %
Monocyte-Macrophage-Serous Fluid: 57 % (ref 50–90)
Neutrophil Count, Fluid: 41 % — ABNORMAL HIGH (ref 0–25)
Total Nucleated Cell Count, Fluid: 305 cu mm (ref 0–1000)

## 2021-07-17 LAB — CBC
HCT: 23.7 % — ABNORMAL LOW (ref 36.0–46.0)
HCT: 20.5 % — ABNORMAL LOW (ref 36.0–46.0)
Hemoglobin: 8 g/dL — ABNORMAL LOW (ref 12.0–15.0)
Hemoglobin: 6.7 g/dL — CL (ref 12.0–15.0)
MCH: 27.9 pg (ref 26.0–34.0)
MCH: 27.7 pg (ref 26.0–34.0)
MCHC: 33.8 g/dL (ref 30.0–36.0)
MCHC: 32.7 g/dL (ref 30.0–36.0)
MCV: 82.6 fL (ref 80.0–100.0)
MCV: 84.7 fL (ref 80.0–100.0)
Platelets: 152 10*3/uL (ref 150–400)
Platelets: 136 10*3/uL — ABNORMAL LOW (ref 150–400)
RBC: 2.87 MIL/uL — ABNORMAL LOW (ref 3.87–5.11)
RBC: 2.42 MIL/uL — ABNORMAL LOW (ref 3.87–5.11)
RDW: 17.1 % — ABNORMAL HIGH (ref 11.5–15.5)
RDW: 17.7 % — ABNORMAL HIGH (ref 11.5–15.5)
WBC: 21.7 10*3/uL — ABNORMAL HIGH (ref 4.0–10.5)
WBC: 19.6 10*3/uL — ABNORMAL HIGH (ref 4.0–10.5)
nRBC: 6.4 % — ABNORMAL HIGH (ref 0.0–0.2)
nRBC: 5.7 % — ABNORMAL HIGH (ref 0.0–0.2)

## 2021-07-17 LAB — GLUCOSE, CAPILLARY
Glucose-Capillary: 131 mg/dL — ABNORMAL HIGH (ref 70–99)
Glucose-Capillary: 79 mg/dL (ref 70–99)
Glucose-Capillary: 124 mg/dL — ABNORMAL HIGH (ref 70–99)
Glucose-Capillary: 147 mg/dL — ABNORMAL HIGH (ref 70–99)
Glucose-Capillary: 131 mg/dL — ABNORMAL HIGH (ref 70–99)
Glucose-Capillary: 84 mg/dL (ref 70–99)

## 2021-07-17 LAB — PREPARE RBC (CROSSMATCH)

## 2021-07-17 LAB — ABO/RH: ABO/RH(D): A NEG

## 2021-07-17 LAB — PHOSPHORUS: Phosphorus: 4.4 mg/dL (ref 2.5–4.6)

## 2021-07-17 LAB — HEMOGLOBIN AND HEMATOCRIT, BLOOD
HCT: 31.5 % — ABNORMAL LOW (ref 36.0–46.0)
Hemoglobin: 10.9 g/dL — ABNORMAL LOW (ref 12.0–15.0)

## 2021-07-17 LAB — MAGNESIUM: Magnesium: 2.1 mg/dL (ref 1.7–2.4)

## 2021-07-17 MED ORDER — SODIUM CHLORIDE 0.9% FLUSH
10.0000 mL | Freq: Two times a day (BID) | INTRAVENOUS | Status: DC
  Administered 2021-07-17 – 2021-07-18 (×3): 10 mL
  Administered 2021-07-18: 20 mL
  Administered 2021-07-19: 10 mL

## 2021-07-17 MED ORDER — INSULIN DETEMIR 100 UNIT/ML ~~LOC~~ SOLN
8.0000 [IU] | Freq: Two times a day (BID) | SUBCUTANEOUS | Status: DC
  Administered 2021-07-17 (×2): 8 [IU] via SUBCUTANEOUS
  Filled 2021-07-17 (×4): qty 0.08

## 2021-07-17 MED ORDER — EPINEPHRINE HCL 5 MG/250ML IV SOLN IN NS
INTRAVENOUS | Status: AC
  Administered 2021-07-17: 0.5 ug/min via INTRAVENOUS
  Filled 2021-07-17: qty 250

## 2021-07-17 MED ORDER — EPINEPHRINE HCL 5 MG/250ML IV SOLN IN NS: 0.5000 ug/min | INTRAVENOUS | Status: DC

## 2021-07-17 MED ORDER — PROPOFOL 1000 MG/100ML IV EMUL
5.0000 ug/kg/min | INTRAVENOUS | Status: DC
  Administered 2021-07-18: 25 ug/kg/min via INTRAVENOUS
  Administered 2021-07-18: 20 ug/kg/min via INTRAVENOUS
  Administered 2021-07-18: 40 ug/kg/min via INTRAVENOUS
  Administered 2021-07-19: 15 ug/kg/min via INTRAVENOUS
  Filled 2021-07-17 (×4): qty 100

## 2021-07-17 MED ORDER — MIDODRINE HCL 5 MG PO TABS
10.0000 mg | ORAL_TABLET | Freq: Three times a day (TID) | ORAL | Status: DC
  Administered 2021-07-17 – 2021-07-19 (×6): 10 mg
  Filled 2021-07-17 (×6): qty 2

## 2021-07-17 MED ORDER — SODIUM CHLORIDE 0.9% IV SOLUTION: Freq: Once | INTRAVENOUS | Status: AC

## 2021-07-17 MED ORDER — DOPAMINE-DEXTROSE 3.2-5 MG/ML-% IV SOLN
0.0000 ug/kg/min | INTRAVENOUS | Status: DC
  Administered 2021-07-17: 2 ug/kg/min via INTRAVENOUS
  Filled 2021-07-17: qty 250

## 2021-07-17 MED ORDER — SODIUM CHLORIDE 0.9% FLUSH: 10.0000 mL | INTRAVENOUS | Status: DC | PRN

## 2021-07-17 MED ORDER — ATROPINE SULFATE 1 MG/10ML IJ SOSY
PREFILLED_SYRINGE | INTRAMUSCULAR | Status: AC
  Filled 2021-07-17: qty 10

## 2021-07-17 MED ORDER — PREDNISONE 20 MG PO TABS
40.0000 mg | ORAL_TABLET | Freq: Every day | ORAL | Status: DC
  Administered 2021-07-17 – 2021-07-19 (×3): 40 mg
  Filled 2021-07-17 (×3): qty 2

## 2021-07-17 MED ORDER — MIDODRINE HCL 5 MG PO TABS
5.0000 mg | ORAL_TABLET | Freq: Once | ORAL | Status: AC
  Administered 2021-07-17: 5 mg
  Filled 2021-07-17: qty 1

## 2021-07-17 MED ORDER — PROPOFOL 1000 MG/100ML IV EMUL
INTRAVENOUS | Status: AC
  Administered 2021-07-17: 5 ug/kg/min via INTRAVENOUS
  Filled 2021-07-17: qty 100

## 2021-07-17 MED ORDER — LIDOCAINE-EPINEPHRINE 1 %-1:100000 IJ SOLN
10.0000 mL | Freq: Once | INTRAMUSCULAR | Status: AC
  Administered 2021-07-17: 5 mL via INTRADERMAL
  Filled 2021-07-17: qty 10

## 2021-07-17 MED ORDER — "THROMBI-PAD 3""X3"" EX PADS"
1.0000 | MEDICATED_PAD | Freq: Once | CUTANEOUS | Status: AC
  Administered 2021-07-17: 1 via TOPICAL
  Filled 2021-07-17 (×2): qty 1

## 2021-07-17 MED ORDER — VITAL HIGH PROTEIN PO LIQD
1000.0000 mL | ORAL | Status: DC
  Administered 2021-07-17 – 2021-07-18 (×2): 1000 mL

## 2021-07-17 MED ORDER — ETOMIDATE 2 MG/ML IV SOLN
20.0000 mg | Freq: Once | INTRAVENOUS | Status: AC
  Administered 2021-07-17: 20 mg via INTRAVENOUS
  Filled 2021-07-17: qty 10

## 2021-07-17 NOTE — Progress Notes (Signed)
SLP Cancellation Note  Patient Details Name: Cynthia [name] MRN: [id number] DOB: May 25, 1951   Cancelled treatment:       Reason Eval/Treat Not Completed: Patient not medically ready Patient with new tracheostomy 1/6. Orders for SLP eval and treat for PMSV and swallowing received. Will follow pt closely for readiness for SLP interventions as appropriate.   Amyriah Buras I. Hardin Negus, [place], [place] Office number [phone] Pager [place] 07/17/2021, 7:42 AM

## 2021-07-17 NOTE — Progress Notes (Signed)
EEG complete - results pending 

## 2021-07-17 NOTE — Progress Notes (Signed)
[facility] Progress Note Patient Name: Cynthia [name] DOB: October 25, 1950 MRN: [id number]   Date of Service  07/17/2021  HPI/Events of Note  Restraints renewal while critically ill/vented.  Camera eval done.   eICU Interventions  Bilateral, non violent soft wrist restriants ordered to prevent self injury and harm from pullineg lines/tubes     Intervention Category Minor Interventions: Agitation / anxiety - evaluation and management  Elmer Sow 07/17/2021, 10:37 PM

## 2021-07-17 NOTE — Progress Notes (Signed)
NAME:  Cynthia [name], MRN:  [id number], DOB:  04-27-1951, LOS: 9 ADMISSION DATE:  07/04/2021, CONSULTATION DATE:  07/17/2021 REFERRING MD:  [facility], CHIEF COMPLAINT:  respiratory failure   History of Present Illness:  Cynthia [name] is a 71 y.o. woman with past medical history of tobacco use disorder. Presented to [facility] with hypoxemia, fevers, fatigue.  She has history of COPD.  Started on therapy for sepsis from community acquired pneumonia.  Needed Bipap and high flow oxygen.  PCCM consulted to assist with respiratory management.  Pertinent  Medical History  GERD, HLD, Migraine headaches, Anemia, Anxiety, OA, Depression, HCV, HTN, PTSD, Cocaine abuse in remission, Chronic pain  Significant Hospital Events: Including procedures, antibiotic start and stop dates in addition to other pertinent events   12/30 Admit 12/31 start intermittent Bipap use, start precedex 01/01 GI consult for melena 01/03 increased dyspnea/hypoxia; start solumedrol; altered mental status with MRI showing multifocal strokes 01/04 intubated, start peripheral pressors, transferred to [facility] hospital 1/6 extubation trial 1/7 [place] talk, tracheostomy  Interim History / Subjective:  No events. Having bleeding from trach. Mild pressor need with escalated sedation  Objective   Blood pressure (!) 107/59, pulse 77, temperature 97.7 F (36.5 C), temperature source Oral, resp. rate 17, height _0  (1.6 m), weight 57.2 kg, SpO2 93 %.    Vent Mode: PRVC FiO2 (%):  [40 %-100 %] 50 % Set Rate:  [24 bmp] 24 bmp Vt Set:  [420 mL] 420 mL PEEP:  [5 cmH20] 5 cmH20 Plateau Pressure:  [14 cmH20-27 cmH20] 27 cmH20   Intake/Output Summary (Last 24 hours) at 07/17/2021 0733 Last data filed at 07/17/2021 0600 Gross per 24 hour  Intake 1546.78 ml  Output 1000 ml  Net 546.78 ml    Filed Weights   07/15/21 0402 07/16/21 0442 07/17/21 0448  Weight: 60.6 kg 58.2 kg 57.2 kg    Examination: Sitting up in bed Trach with  clotting along inferior margin Lungs with rhonci Ext warm Purposeful x 4 more so on R  WBC going up Hgb slightly down probably ABLA Cr stable Sodium improved  Resolved Hospital Problem list     Assessment & Plan:  Acute hypoxemic respiratory failure- no response to diuretics or Abx, previous episode responded to steroids, ?AIP or similar, BAL 07/14/20; NGTD; 1/7 repeat bronch yeast.  LE duplex neg Acute metabolic encephalopathy, acute embolic strokes with small inter-atrial shunt found- overall exam worse than imaging suggests; discussed with neuro, will probably need another MRI to figure out what we are dealing with; my suspicion is she has bad delirium on top of her strokes Circulatory shock, leukocytosis- rising white count, not really seeing a source of infection; will keep an eye on fever curve and attribute the leukocytosis to steroid effect Hypernatremia- better with FWF, continue Steroid induced hyperglycemia- actually low today, will lighten insulin AKI, non oliguric- stable ABLA, bleeding trach- will sedate and stitch inferior margin of stoma to get this under better control.  There is also a question of   IV steroids to prednisone 71m, would do a 1-2 week taper for AEILD NOS  Control trach bleeding today then MRI then work on sedation weaning  TEE this week  Hold full dose aspirin and lovenox for now; once trach bleeding under control resume.  ? If she needs full dose AC, will need to discuss with neuro down the line  Regarding ?black stool, will start PPI and monitor clinically; should it continue may ask GI  to consider EGD  Continue FWF  Wean levophed for MAP 65, will order PICC; if they cannot get today will do CVL  GOC: family does not want permanent SNF; need time, sodium correction, sedation wean to figure out where she will plateau; if she does not wake up meaningfully daughter seemed open to comfort care discussion  Best Practice (right click and "Reselect all  SmartList Selections" daily)   Diet/type: resume TF DVT prophylaxis: SCDs given bleeding GI prophylaxis: PPI Lines: N/A Foley:  N/A Code Status:  full code Last date of multidisciplinary goals of care discussion [see note 07/16/21]  Patient critically ill due to resp failure, shock Interventions to address this today include pressor titration, sedation titration Risk of deterioration without these interventions is high  I personally spent 31 minutes providing critical care not including any separately billable procedures  Erskine Emery MD [facility]  Prefer epic messenger for cross cover needs If after hours, please call E-link

## 2021-07-17 NOTE — Progress Notes (Signed)
PT Cancellation Note  Patient Details Name: MARYGRACE SANDOVAL MRN: [id number] DOB: 1951/03/14   Cancelled Treatment:    Reason Eval/Treat Not Completed: Patient not medically ready - per RN, trying to minimize movement to reduce bleeding at new trach site. Will follow-up for PT Evaluation as appropriate.  Mabeline Caras, PT, DPT Acute Rehabilitation Services  Pager [phone] Office [place] 07/17/2021, 7:42 AM

## 2021-07-17 NOTE — Progress Notes (Addendum)
STROKE TEAM PROGRESS NOTE   INTERVAL HISTORY RN and CCM at the bedside. Patient is sedated due tracheostomy bleeding. CCM at the bedside with RN to manage trach bleeding. Patient is withdrawing to pain, no commands. Does sit straight up in bed with noxious stimuli to lower extremities.   Vitals:   07/17/21 1230 07/17/21 1245 07/17/21 1300 07/17/21 1400  BP: (!) 113/56 (!) 112/57 (!) 113/53 (!) 111/50  Pulse: (!) 58 (!) 57 (!) 56 (!) 47  Resp: (!) 21 (!) 24 (!) 24 (!) 24  Temp:      TempSrc:      SpO2: 99% 100% 100% 100%  Weight:      Height:       CBC:  Recent Labs  Lab 07/16/21 0121 07/17/21 0147  WBC 17.4* 21.7*  HGB 9.1* 8.0*  HCT 26.5* 23.7*  MCV 81.8 82.6  PLT 153 371    Basic Metabolic Panel:  Recent Labs  Lab 07/16/21 0121 07/17/21 0147  NA 156* 153*  K 3.2* 3.7  CL 123* 121*  CO2 22 23  GLUCOSE 110* 106*  BUN 48* 44*  CREATININE 1.16* 1.12*  CALCIUM 9.6 8.4*  MG 2.1 2.1  PHOS 2.0* 4.4    Lipid Panel:  Recent Labs  Lab 07/13/21 0432  CHOL 78  TRIG 144  HDL 11*  CHOLHDL 7.1  VLDL 29  LDLCALC 38    HgbA1c:  Recent Labs  Lab 07/13/21 0432  HGBA1C 5.5    Urine Drug Screen: No results for input(s): LABOPIA, COCAINSCRNUR, LABBENZ, AMPHETMU, THCU, LABBARB in the last 168 hours.  Alcohol Level No results for input(s): ETH in the last 168 hours.  IMAGING past 24 hours DG Chest Port 1 View  Result Date: 07/16/2021 CLINICAL DATA:  Post tracheostomy.  NG tube placement. EXAM: PORTABLE CHEST 1 VIEW COMPARISON:  07/16/2021 FINDINGS: Tracheostomy tip projects over the mid to lower trachea. No pneumothorax. NG tube is in the stomach. Bilateral interstitial and airspace opacities diffusely, similar to prior study. Heart is normal size. No effusions. IMPRESSION: Tracheostomy and NG tube in expected position. No pneumothorax. Otherwise no significant change. Electronically Signed   By: Rolm Baptise M.D.   On: 07/16/2021 19:07   [place] EKG SITE RITE  Result  Date: 07/17/2021 If Site Rite image not attached, placement could not be confirmed due to current cardiac rhythm.   PHYSICAL EXAM  Temp:  [96 F (35.6 C)-98.1 F (36.7 C)] 96 F (35.6 C) (01/08 1144) Pulse Rate:  [47-144] 47 (01/08 1400) Resp:  [12-32] 24 (01/08 1400) BP: (86-162)/(31-99) 111/50 (01/08 1400) SpO2:  [79 %-100 %] 100 % (01/08 1400) FiO2 (%):  [40 %-100 %] 100 % (01/08 1200) Weight:  [57.2 kg] 57.2 kg (01/08 0448)  General - thin built, well developed, trached, opened eyes once to noxious stimuli. Eyes deviated upward. not following commands or responding to verbal stimuli.  Ophthalmologic - fundi not visualized due to noncooperation.  Cardiovascular - Regular rhythm and rate.  Neuro - eyes closed, not open on voice, grimace with pain stimulation but not open eyes. Non verbal, not answer questions. With forced eye opening, pupil equal but sluggish to light, eyes midline with upward gaze, not blinking to visual threat. Positive corneals. Intermittent coughing. Facial symmetrical. With pain stimulation, BLE withdraw equally. LUE localize to pain and against gravity. RUE mild withdraw to pain but not against gravity. Sensation, coordination and gait not tested.   ASSESSMENT/PLAN Cynthia [name] is a 71 y.o.  female with history of COPD, HTN, Depression, GERD, HTN, migraines, hx of tobacco use who was admitted to [facility] with SOB with exertion along with nausea and vomiting and a fever of 102.66F. She was found to have Pneumonia and acute GI bleed. Blood cultures were sent and have so far been negative as well as respiratory culture.  She is on antibiotics including Zosyn and doxycycline.  GI was consulted for the GI bleed, they are deferring the endoscopy until her respiratory status improves. She developed encephalopathy during admission and an MRI Brain demonstrated multple embolic appearing strokes. She was intubated for worsening SOB and mentation. She was  transferred to [facility] for further evaluation and management 07/13/2021.  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  She remains intubated moving all extremities antigravity and following simple extremity commands.  CCM plans to bronch her today.  2D echo, venous Doppler, TCD with bubble pending.   Stroke:  embolic shower in b/l anterior and posterior distribution, etiology unclear likely secondary to a cardioembolic source CT head -There is a new small focus of low attenuation in the left frontal cortex suggesting possible recent or old infarct in the left MCA distribution MRI-  multiple acute and subacute infarcts in the bilateral cerebral and cerebellar hemispheres.  Largest infarction in the left posterior frontal cortex. MRA  Motion degraded, no sign of LVO Carotid Doppler unremarkable 2D Echo EF 60-65%.  Small left-to-right shunt. TCD bubble study - No HITS at rest or during Valsalva.Bubble study with no evidence of right to left intracardiac communication.  Venous Duplex LE-negative for DVT  Recommend TEE to further evaluate PFO and to rule out endocarditis Repeat MRI pending LDL 38 HgbA1c 5.5 VTE prophylaxis - SCDs No antithrombotic prior to admission, on aspirin 300 PR, now hold for trach bleeding Therapy recommendations:  pending Disposition:  pending  Fever  Leukocytosis Pneumonia Tmax 102.4 PTA -> afebrile WBC 14.6->17.4-> 21.7-> 19.6 Initial CXR concerning for diffuse bilateral lung infiltrates Management per CCM Was on Zosyn and doxycycline Recommend TEE to rule out endocarditis  Respiratory failure S/p tracheostomy 07/16/2021 with Dr. Tamala Julian after failed extubation Tracheostomy bleeding   AMS Likely multifactorial encephalopathy May be related to stroke, AKI, fever, leukocytosis, GI bleeding EEG encephalopathy, no seizure repeat MRI brain pending  GIB Dysphagia Dark stool please fecal occult blood  positive However, no significant bleeding so far GI on board - 07/10/21 -> monitor H&H and consider transfusion if hemoglobin drops below 8 g. Esophagogastroduodenoscopy when patient deemed to be stable from respiratory standpoint. H/H- 8.9/26.7->9.1/26.5  Continue aspirin 300 PR for now Tube feeding and free water on hold Put on IV fluid  Trach bleeding Anemia Management per CCM Hb 8.0-6.7-7.8 Status post PRBC transfusion CBC monitoring  Hypertension Home meds: Norvasc 10 mg, propranolol 80 mg, Lasix 20 mg Stable Long-term BP goal normotensive  Hyperlipidemia Home meds: Atorvastatin 40 mg, resumed in hospital LDL 38, goal < 70 High intensity statin not indicated given LDL at goal Continue statin at discharge  Tobacco abuse Current smoker Smoking cessation counseling will be provided  Other Stroke Risk Factors Advanced Age >/= 76  Migraines  Other Active Problems Hypernatremia - Na 148->156-> 153-> 150 on IV fluid AKI Cr.  1.45 ->1.54->1.16-> 1.12   Hospital day # 9  Patient seen and examined by NP/APP with MD. MD to update note as needed.   Janine Ores, DNP, FNP-BC [facility] Pager: [phone]  ATTENDING NOTE:  I reviewed above note and agree with the assessment and plan. Pt was seen and examined.   Daughter at bedside.  Dr. Tamala Julian is at bedside also.  Patient was sedated on propofol for trach bleeding.  Currently bleeding controlled, however patient does have lowered hemoglobin down to 6.7, status post PRBC transfusion.  Per report patient was sitting up on pain stimulation, on my examination, patient was sedated, not open eyes on voice, with forced eye opening, eyes in midline, no doll's eyes, weak corneal bilaterally, positive gag, not moving extremities on pain stimulation.  Currently aspirin on hold due to anemia from trach bleeding.  Sodium and creatinine improved.  We will repeat MRI given continued altered mental status.  For detailed  assessment and plan, please refer to above as I have made changes wherever appropriate.   This patient is critically ill due to altered mental status, severe anemia, respiratory failure status post trach complicated with trach bleeding, embolic shower, leukocytosis and at significant risk of neurological worsening, death form respiratory failure, severe anemia, recurrent stroke, seizure, sepsis. This patient's care requires constant monitoring of vital signs, hemodynamics, respiratory and cardiac monitoring, review of multiple databases, neurological assessment, discussion with family, other specialists and medical decision making of high complexity. I spent 30 minutes of neurocritical care time in the care of this patient.  I discussed with Dr. Tamala Julian CCM.    Rosalin Hawking, MD PhD Stroke Neurology 07/17/2021 6:45 PM    To contact Stroke Continuity provider, please refer to [email or web address] After hours, contact General Neurology

## 2021-07-17 NOTE — Progress Notes (Signed)
Peripherally Inserted Central Catheter Placement  The IV Nurse has discussed with the patient and/or persons authorized to consent for the patient, the purpose of this procedure and the potential benefits and risks involved with this procedure.  The benefits include less needle sticks, lab draws from the catheter, and the patient may be discharged home with the catheter. Risks include, but not limited to, infection, bleeding, blood clot (thrombus formation), and puncture of an artery; nerve damage and irregular heartbeat and possibility to perform a PICC exchange if needed/ordered by physician.  Alternatives to this procedure were also discussed.  Bard Power PICC patient education guide, fact sheet on infection prevention and patient information card has been provided to patient /or left at bedside.   Consent was obtained by primary RN at bedside  PICC Placement Documentation  PICC Triple Lumen 07/17/21 PICC Right Brachial 37 cm 1 cm (Active)  Indication for Insertion or Continuance of Line Vasoactive infusions 07/17/21 1000  Exposed Catheter (cm) 1 cm 07/17/21 1000  Site Assessment Clean;Dry 07/17/21 1000  Lumen #1 Status Flushed;Blood return noted;Saline locked 07/17/21 1000  Lumen #2 Status Flushed;Saline locked;Blood return noted 07/17/21 1000  Lumen #3 Status Flushed;Saline locked;Blood return noted 07/17/21 1000  Dressing Type Transparent;Securing device 07/17/21 1000  Dressing Status Clean;Dry;Intact 07/17/21 1000  Antimicrobial disc in place? Yes 07/17/21 1000  Safety Lock Not Applicable 01/02/93 8546  Line Care Connections checked and tightened 07/17/21 1000  Dressing Intervention New dressing 07/17/21 1000  Dressing Change Due 07/24/21 07/17/21 1000       Holley Bouche Renee 07/17/2021, 10:49 AM

## 2021-07-17 NOTE — Procedures (Signed)
History: 71 year old female being evaluated for AMS  Sedation: Fentanyl  Technique: This is a 21 channel routine scalp EEG performed at the bedside with bipolar and monopolar montages arranged in accordance to the international 10/20 system of electrode placement. One channel was dedicated to EKG recording.    Background: The background consists of generalized irregular delta and theta range activities.  There is no definite posterior dominant rhythm seen. There is a single discharge with a wide left hemispheric field which could be suggestive of cortical irritability, but in isolation, and with an unusual field, I favor an artifactual nature.   Photic stimulation: Physiologic driving is not performed  EEG Abnormalities: 1) Generalized irregular slow activity.  2) Absent PDR  Clinical Interpretation: This EEG is consistent with a generalized non-specific cerebral dysfunction(encephalopathy). There was no seizure or seizure predisposition recorded on this study.   Roland Rack, MD [facility] [phone]  If 7pm- 7am, please page neurology on call as listed in [place].

## 2021-07-18 DIAGNOSIS — I6340 Cerebral infarction due to embolism of unspecified cerebral artery: Secondary | ICD-10-CM

## 2021-07-18 LAB — TYPE AND SCREEN
ABO/RH(D): A NEG
Antibody Screen: NEGATIVE
Unit division: 0
Unit division: 0

## 2021-07-18 LAB — BPAM RBC
Blood Product Expiration Date: 202301202359
Blood Product Expiration Date: 202301202359
ISSUE DATE / TIME: 202301081520
ISSUE DATE / TIME: 202301081746
Unit Type and Rh: 600
Unit Type and Rh: 600

## 2021-07-18 LAB — BASIC METABOLIC PANEL
Anion gap: 9 (ref 5–15)
BUN: 49 mg/dL — ABNORMAL HIGH (ref 8–23)
CO2: 26 mmol/L (ref 22–32)
Calcium: 8 mg/dL — ABNORMAL LOW (ref 8.9–10.3)
Chloride: 112 mmol/L — ABNORMAL HIGH (ref 98–111)
Creatinine, Ser: 1.19 mg/dL — ABNORMAL HIGH (ref 0.44–1.00)
GFR, Estimated: 50 mL/min — ABNORMAL LOW (ref >60)
Glucose, Bld: 142 mg/dL — ABNORMAL HIGH (ref 70–99)
Potassium: 3.7 mmol/L (ref 3.5–5.1)
Sodium: 147 mmol/L — ABNORMAL HIGH (ref 135–145)

## 2021-07-18 LAB — CBC
HCT: 29.3 % — ABNORMAL LOW (ref 36.0–46.0)
Hemoglobin: 10 g/dL — ABNORMAL LOW (ref 12.0–15.0)
MCH: 28.4 pg (ref 26.0–34.0)
MCHC: 34.1 g/dL (ref 30.0–36.0)
MCV: 83.2 fL (ref 80.0–100.0)
Platelets: 126 10*3/uL — ABNORMAL LOW (ref 150–400)
RBC: 3.52 MIL/uL — ABNORMAL LOW (ref 3.87–5.11)
RDW: 17.1 % — ABNORMAL HIGH (ref 11.5–15.5)
WBC: 16.6 10*3/uL — ABNORMAL HIGH (ref 4.0–10.5)
nRBC: 4.5 % — ABNORMAL HIGH (ref 0.0–0.2)

## 2021-07-18 LAB — TRIGLYCERIDES: Triglycerides: 95 mg/dL (ref <150)

## 2021-07-18 LAB — CULTURE, RESPIRATORY W GRAM STAIN: Gram Stain: NONE SEEN

## 2021-07-18 LAB — GLUCOSE, CAPILLARY
Glucose-Capillary: 107 mg/dL — ABNORMAL HIGH (ref 70–99)
Glucose-Capillary: 123 mg/dL — ABNORMAL HIGH (ref 70–99)
Glucose-Capillary: 123 mg/dL — ABNORMAL HIGH (ref 70–99)
Glucose-Capillary: 128 mg/dL — ABNORMAL HIGH (ref 70–99)
Glucose-Capillary: 90 mg/dL (ref 70–99)
Glucose-Capillary: 98 mg/dL (ref 70–99)

## 2021-07-18 LAB — MAGNESIUM: Magnesium: 2.1 mg/dL (ref 1.7–2.4)

## 2021-07-18 LAB — PHOSPHORUS: Phosphorus: 5.9 mg/dL — ABNORMAL HIGH (ref 2.5–4.6)

## 2021-07-18 LAB — CYTOLOGY - NON PAP

## 2021-07-18 MED ORDER — INSULIN DETEMIR 100 UNIT/ML ~~LOC~~ SOLN
4.0000 [IU] | Freq: Two times a day (BID) | SUBCUTANEOUS | Status: DC
  Administered 2021-07-18 (×2): 4 [IU] via SUBCUTANEOUS
  Filled 2021-07-18 (×4): qty 0.04

## 2021-07-18 MED ORDER — REVEFENACIN 175 MCG/3ML IN SOLN
175.0000 ug | Freq: Every day | RESPIRATORY_TRACT | Status: DC
  Administered 2021-07-18 – 2021-07-19 (×2): 175 ug via RESPIRATORY_TRACT
  Filled 2021-07-18 (×2): qty 3

## 2021-07-18 MED ORDER — SODIUM CHLORIDE 0.9 % IV SOLN: INTRAVENOUS | Status: DC | PRN

## 2021-07-18 NOTE — Progress Notes (Signed)
PCCM Interval Note  I reviewed BAL cytology with Dr Melina Copa from Pathology. RML BAL was done at time of her trach placement.  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  I called patient's daughter Cynthia [name] to discuss, no answer. Will call her back.   Baltazar Apo, MD, PhD 07/18/2021, 2:55 PM [facility] [phone] or if no answer before 7:00PM call [phone] For any issues after 7:00PM please call eLink [phone]

## 2021-07-18 NOTE — Progress Notes (Signed)
SLP Cancellation Note  Patient Details Name: Cynthia [name] MRN: [id number] DOB: 11-23-1950   Cancelled treatment:        Attempted to see pt for assessment of swallowing and trial of PMSV. Pt remains on vent at this time and is requiring a lot of support.  SLP will follow remotely for medical readiness for evaluation.   Celedonio Savage, MA, [place] Office: [phone]  07/18/2021, 11:23 AM

## 2021-07-18 NOTE — Progress Notes (Signed)
PT Cancellation Note  Patient Details Name: Cynthia [name] MRN: [id number] DOB: 01/12/1951   Cancelled Treatment:    Reason Eval/Treat Not Completed: Patient not medically ready.   Pt in family discussion with MD AND RN asked for [place] to hold today. 07/18/2021  Ginger Carne., PT Acute Rehabilitation Services [phone]  (pager) [phone]  (office)   Tessie Fass Athan Casalino 07/18/2021, 8:10 PM

## 2021-07-18 NOTE — Progress Notes (Signed)
Chaplain engaged in an initial visit with Oletha's daughter, best friend, sister in law, and brothers.  Chaplain offered support as she learned about Maurina through them.  Daughter described Javia as being quiet and loving scary movies.  Pakou's best friend stated that she has a heart of gold and that they have always been close.  Filomena's brother voiced that she was "fiery" growing up.    Chaplain was able to offer prayer around [place] with family present.  Daughter is still in a state of shock and disbelief concerning her mom's diagnosis while also recognizing that her mom would not want to suffer.  She believes in respecting and honoring her mom.    Chaplain offered presence, support, and listening.     07/18/21 1800  Clinical Encounter Type  Visited With Patient and family together  Visit Type Spiritual support;Initial;Patient actively dying  Referral From Family;Nurse  Consult/Referral To Chaplain  Spiritual Encounters  Spiritual Needs Prayer;Emotional;Grief support  Stress Factors  Family Stress Factors Loss of control;Major life changes;Exhausted

## 2021-07-18 NOTE — Progress Notes (Signed)
PCCM interval Note  Met with patient's family in ICU, daughter, sister and brother-in-law.  Explained there dx of squamous cell lung CA and it's impact on her prognosis. I have explained that she will not recover from this illness. They understand and want to honor her wishes which are to be made comfortable under such circumstances.  We will continue her sedating meds, change code status to DNR.  Plan to discuss timing of formal withdrawal of care on 1/10.   Critical Care time 30 minutes  Baltazar Apo, MD, PhD 07/18/2021, 5:22 PM [facility] [phone] or if no answer before 7:00PM call [phone] For any issues after 7:00PM please call eLink [phone]

## 2021-07-18 NOTE — Progress Notes (Signed)
Pt placed on PSV  by RT, pt tolerating well, RN aware, RT will continue to monitor.

## 2021-07-18 NOTE — Progress Notes (Addendum)
NAME:  Cynthia [name], MRN:  [id number], DOB:  Nov 05, 1950, LOS: 58 ADMISSION DATE:  06/09/2021, CONSULTATION DATE:  07/18/2021 REFERRING MD:  [facility], CHIEF COMPLAINT:  respiratory failure   History of Present Illness:  Cynthia [name] is a 71 y.o. woman with past medical history of tobacco use disorder. Presented to [facility] with hypoxemia, fevers, fatigue.  She has history of COPD.  Started on therapy for sepsis from community acquired pneumonia.  Needed Bipap and high flow oxygen.  PCCM consulted to assist with respiratory management.  Pertinent  Medical History  GERD, HLD, Migraine headaches, Anemia, Anxiety, OA, Depression, HCV, HTN, PTSD, Cocaine abuse in remission, Chronic pain  Significant Hospital Events: Including procedures, antibiotic start and stop dates in addition to other pertinent events   12/30 Admit 12/31 start intermittent Bipap use, start precedex 01/01 GI consult for melena 01/03 increased dyspnea/hypoxia; start solumedrol; altered mental status with MRI showing multifocal strokes 01/04 intubated, start peripheral pressors, transferred to [facility] 1/6 extubation trial 1/7 [place] talk, tracheostomy  Interim History / Subjective:  Fentanyl 75, propofol 30 Dopamine 3 0.60, PEEP 5.  Rate 30 Trach bleeding > resolved   Objective   Blood pressure (!) 103/48, pulse (!) 49, temperature 97.6 F (36.4 C), temperature source Axillary, resp. rate (!) 30, height _0  (1.6 m), weight 57.2 kg, SpO2 100 %.    Vent Mode: PRVC FiO2 (%):  [60 %-100 %] 60 % Set Rate:  [24 bmp-30 bmp] 30 bmp Vt Set:  [420 mL] 420 mL PEEP:  [5 cmH20] 5 cmH20 Plateau Pressure:  [19 cmH20-31 cmH20] 19 cmH20   Intake/Output Summary (Last 24 hours) at 07/18/2021 0812 Last data filed at 07/18/2021 0800 Gross per 24 hour  Intake 2933.91 ml  Output 1100 ml  Net 1833.91 ml   Filed Weights   07/15/21 0402 07/16/21 0442 07/17/21 0448  Weight: 60.6 kg 58.2 kg 57.2 kg    Examination: Gen:  Ill appearing woman, slightly agitated HEENT:Trach in place, no active bleeding. Neck is extended Lungs: coarse B CV: regular, 80's, distant, no M Abd:+ BS, non-distended Ext: no edema Neuro: withdraws from pain, moves 4 ext better on R. Does not track, does not follow commands.   Resolved Hospital Problem list     Assessment & Plan:  Acute hypoxemic respiratory failure- no response to diuretics or Abx, previous episode responded to steroids, ?AIP or similar,  BAL 07/14/20 > negative, and 07/16/21 > NGTD LE duplex > negative Reintubated, question upper airway obstruction.  Trach placed -Solu-Medrol transition to prednisone, currently 40 mg daily.  Plan for slow taper for possible flare pneumonitis -Continue current bronchodilator regimen -Wean sedation as able  ABLA, bleeding trach-  Heme positive stool -Following trach exam -Following CBC -We will need to discuss with neurology whether she needs full dose anticoagulation.  Will probably depend on TEE result -Consider GI consultation.  Continue scheduled PPI  Acute metabolic encephalopathy, acute embolic strokes with small inter-atrial shunt found on TTE 1/5-  -Appreciate neurology input -EEG 1/8 without any evidence of active seizure -Neurology recommends TEE.  Will speak with cardiology about this depending on our overall [place] discussions.  -Planning for repeat MRI brain -Attempt to wean sedating medications given presumed delirium superimposed on acute and chronic CVA  Shock.  Multifactorial but suspect significant sedation effect with associated bradycardia -No clear evidence of infection, cultures negative -Following off antibiotics. -Following WBC (on steroids), -goal dopamine to off 1/9  Hypernatremia-  -Continue free  water -Follow BMP  Steroid induced hyperglycemia-  -Sliding scale insulin -Levemir 8 units twice daily >> decrease again on 1/9 to 4u bid  AKI, non oliguric-improving -Continue to follow  BMP    GOC: family does not want permanent SNF; need time, sodium correction, sedation wean to figure out where she will plateau; if she does not wake up meaningfully daughter seemed open to comfort care discussion  Best Practice (right click and "Reselect all SmartList Selections" daily)   Diet/type: resume TF DVT prophylaxis: SCDs given bleeding GI prophylaxis: PPI Lines: N/A Foley:  N/A Code Status:  full code Last date of multidisciplinary goals of care discussion [see note 07/16/21]  Patient critically ill due to resp failure, shock Interventions to address this today include pressor titration, sedation titration Risk of deterioration without these interventions is high  I personally spent 32 minutes providing critical care not including any separately billable procedures  Baltazar Apo, MD, PhD 07/18/2021, 8:12 AM [facility] [phone] or if no answer before 7:00PM call [phone] For any issues after 7:00PM please call eLink [phone]

## 2021-07-18 NOTE — Progress Notes (Addendum)
STROKE TEAM PROGRESS NOTE   INTERVAL HISTORY RN at the bedside. Patient is sedated with propofol due tracheostomy bleeding. Worsened bradycardia, she is now on dopamine to manage that as well. Neuro exam limited by sedation, hemodynamically stable with dopamine infusing. Plan for MRI today.   Vitals:   07/18/21 1000 07/18/21 1030 07/18/21 1100 07/18/21 1101  BP: (!) 127/53 (!) 125/53 (!) 120/54 (!) 120/54  Pulse: (!) 52 (!) 52 (!) 53 (!) 52  Resp: (!) 26 (!) 30 (!) 24 (!) 30  Temp:    (!) 97.4 F (36.3 C)  TempSrc:    Oral  SpO2: 100% 100% 100% 100%  Weight:      Height:       CBC:  Recent Labs  Lab 07/17/21 1419 07/17/21 1437 07/17/21 2232 07/18/21 0417  WBC 19.6*  --   --  16.6*  HGB 6.7*   < > 10.2* 10.0*  HCT 20.5*   < > 30.0* 29.3*  MCV 84.7  --   --  83.2  PLT 136*  --   --  126*   < > = values in this interval not displayed.    Basic Metabolic Panel:  Recent Labs  Lab 07/17/21 0147 07/17/21 1437 07/17/21 2232 07/18/21 0417  NA 153*   < > 150* 147*  K 3.7   < > 3.8 3.7  CL 121*  --   --  112*  CO2 23  --   --  26  GLUCOSE 106*  --   --  142*  BUN 44*  --   --  49*  CREATININE 1.12*  --   --  1.19*  CALCIUM 8.4*  --   --  8.0*  MG 2.1  --   --  2.1  PHOS 4.4  --   --  5.9*   < > = values in this interval not displayed.    Lipid Panel:  Recent Labs  Lab 07/13/21 0432 07/18/21 0417  CHOL 78  --   TRIG 144 95  HDL 11*  --   CHOLHDL 7.1  --   VLDL 29  --   LDLCALC 38  --     HgbA1c:  Recent Labs  Lab 07/13/21 0432  HGBA1C 5.5    Urine Drug Screen: No results for input(s): LABOPIA, COCAINSCRNUR, LABBENZ, AMPHETMU, THCU, LABBARB in the last 168 hours.  Alcohol Level No results for input(s): ETH in the last 168 hours.  IMAGING past 24 hours EEG adult  Result Date: 07/17/2021 Greta Doom, MD     07/17/2021  2:59 PM History: 70 year old female being evaluated for AMS Sedation: Fentanyl Technique: This is a 21 channel routine scalp EEG  performed at the bedside with bipolar and monopolar montages arranged in accordance to the international 10/20 system of electrode placement. One channel was dedicated to EKG recording. Background: The background consists of generalized irregular delta and theta range activities.  There is no definite posterior dominant rhythm seen. There is a single discharge with a wide left hemispheric field which could be suggestive of cortical irritability, but in isolation, and with an unusual field, I favor an artifactual nature. Photic stimulation: Physiologic driving is not performed EEG Abnormalities: 1) Generalized irregular slow activity. 2) Absent PDR Clinical Interpretation: This EEG is consistent with a generalized non-specific cerebral dysfunction(encephalopathy). There was no seizure or seizure predisposition recorded on this study. Roland Rack, MD [facility] [phone] If 7pm- 7am, please page neurology on call as listed in  [facility].    PHYSICAL EXAM  Temp:  [94.3 F (34.6 C)-97.6 F (36.4 C)] 97.4 F (36.3 C) (01/09 1101) Pulse Rate:  [45-91] 52 (01/09 1101) Resp:  [0-30] 30 (01/09 1101) BP: (93-169)/(31-95) 120/54 (01/09 1101) SpO2:  [80 %-100 %] 100 % (01/09 1101) FiO2 (%):  [60 %-100 %] 60 % (01/09 0801)  General - thin built, well developed, trached, opened eyes once to noxious stimuli. Eyes midline, not following commands or responding to verbal stimuli.  Ophthalmologic - fundi not visualized due to noncooperation.  Cardiovascular - Regular rhythm and rate.  Neuro - eyes closed, not open on voice, grimace with pain stimulation but not open eyes. Non verbal, not answer questions. With forced eye opening, pupil equal but sluggish to light, eyes midline with upward gaze, not blinking to visual threat. Positive corneals. Intermittent coughing. Facial symmetrical. With pain stimulation, BLE withdraw equally. LUE localize to pain and against gravity. RUE mild withdraw to pain  but not against gravity. Sensation, coordination and gait not tested.   ASSESSMENT/PLAN Cynthia [name] is a 71 y.o. female with history of COPD, HTN, Depression, GERD, HTN, migraines, hx of tobacco use who was admitted to [facility] with SOB with exertion along with nausea and vomiting and a fever of 102.16F. She was found to have Pneumonia and acute GI bleed. Blood cultures were sent and have so far been negative as well as respiratory culture.  She is on antibiotics including Zosyn and doxycycline.  GI was consulted for the GI bleed, they are deferring the endoscopy until her respiratory status improves. She developed encephalopathy during admission and an MRI Brain demonstrated multple embolic appearing strokes. She was intubated for worsening SOB and mentation. She was transferred to [facility] for further evaluation and management 07/13/2021.  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  She remains intubated moving all extremities antigravity and following simple extremity commands.  CCM plans to bronch her today.  2D echo, venous Doppler, TCD with bubble pending.   Stroke:  embolic shower in b/l anterior and posterior distribution, etiology unclear likely secondary to a cardioembolic source CT head -There is a new small focus of low attenuation in the left frontal cortex suggesting possible recent or old infarct in the left MCA distribution MRI-  multiple acute and subacute infarcts in the bilateral cerebral and cerebellar hemispheres.  Largest infarction in the left posterior frontal cortex. MRA  Motion degraded, no sign of LVO Carotid Doppler unremarkable 2D Echo EF 60-65%.  Small left-to-right shunt. TCD bubble study - No HITS at rest or during Valsalva.Bubble study with no evidence of right to left intracardiac communication.  Venous Duplex LE-negative for DVT  Recommend TEE to further evaluate PFO once stable and to rule out  endocarditis Repeat MRI pending LDL 38 HgbA1c 5.5 VTE prophylaxis - SCDs No antithrombotic prior to admission, was on aspirin 300 PR, now hold for trach bleeding Therapy recommendations:  pending Disposition:  pending  Fever  Leukocytosis Pneumonia Tmax 102.4 PTA -> afebrile WBC 14.6->17.4-> 21.7-> 19.6->16.6 Initial CXR concerning for diffuse bilateral lung infiltrates Management per CCM Was on Zosyn and doxycycline, now off Recommend TEE once stable to rule out endocarditis  Respiratory failure S/p tracheostomy 07/16/2021 with Dr. Tamala Julian after failed extubation Tracheostomy bleeding s/p PRBC, now stable Still on sedation  Management per CCM  AMS Likely multifactorial encephalopathy May be related to stroke, AKI, fever, leukocytosis, GI bleeding EEG encephalopathy, no seizure repeat  MRI brain pending Consider TEE once stable  GIB Dysphagia Dark stool please fecal occult blood positive However, no significant bleeding so far GI on board - 07/10/21 -> monitor H&H and consider transfusion if hemoglobin drops below 8 g. Esophagogastroduodenoscopy when patient deemed to be stable from respiratory standpoint. H/H- 8.9/26.7->9.1/26.5 ->10.0/29.3 Continue aspirin 300 PR for now Tube feeding and free water on hold Put on IV fluid  Trach bleeding Anemia Management per CCM Hb 8.0-6.7-7.8-> 10.0 Status post PRBC transfusion CBC monitoring  Hypertension Home meds: Norvasc 10 mg, propranolol 80 mg, Lasix 20 mg Stable Long-term BP goal normotensive  Bradycardia On dopamine CCM following  Hyperlipidemia Home meds: Atorvastatin 40 mg, resumed in hospital LDL 38, goal < 70 High intensity statin not indicated given LDL at goal Continue statin at discharge  Tobacco abuse Current smoker Smoking cessation counseling will be provided  Other Stroke Risk Factors Advanced Age >/= 60  Migraines  Other Active Problems Hypernatremia - Na 148->156-> 153-> 150->147 on FW AKI  Cr.  1.45 ->1.54->1.16-> 1.12->1.19   Hospital day # 10  Patient seen and examined by NP/APP with MD. MD to update note as needed.   Janine Ores, DNP, FNP-BC [facility] Pager: [phone]  ATTENDING NOTE: I reviewed above note and agree with the assessment and plan. Pt was seen and examined.   No family at the bedside. Pt s/p trach and today trach bleeding controlled, Hb stable. Still on sedation not responsive. Found to have bradycardia and now on dopamine. Neuro unchanged, half way open eyes on stimulation but not tracking not blinking to visual threat not following commands. Positive corneal and gag. Mild withdraw in all extremities to pain.   MRI repeat still pending. ASA on hold for GIB and trach bleeding. Hb stable now after PRBC transfusion. Prognosis guarded. Will follow.   Rosalin Hawking, MD PhD Stroke Neurology 07/18/2021 11:06 AM  This patient is critically ill due to AMS, respiratory failure s/p trach, stroke with embolic shower, bradycardia and at significant risk of neurological worsening, death form recurrent stroke, heart failure, sepsis, seizure. This patient's care requires constant monitoring of vital signs, hemodynamics, respiratory and cardiac monitoring, review of multiple databases, neurological assessment, discussion with family, other specialists and medical decision making of high complexity. I spent 35 minutes of neurocritical care time in the care of this patient.    To contact Stroke Continuity provider, please refer to [email or web address] After hours, contact General Neurology

## 2021-07-19 ENCOUNTER — Inpatient Hospital Stay (HOSPITAL_COMMUNITY): Payer: Medicare Other

## 2021-07-19 DIAGNOSIS — C3490 Malignant neoplasm of unspecified part of unspecified bronchus or lung: Secondary | ICD-10-CM

## 2021-07-19 LAB — PHOSPHORUS: Phosphorus: 4.7 mg/dL — ABNORMAL HIGH (ref 2.5–4.6)

## 2021-07-19 LAB — BASIC METABOLIC PANEL
Anion gap: 7 (ref 5–15)
BUN: 47 mg/dL — ABNORMAL HIGH (ref 8–23)
CO2: 27 mmol/L (ref 22–32)
Calcium: 7.7 mg/dL — ABNORMAL LOW (ref 8.9–10.3)
Chloride: 109 mmol/L (ref 98–111)
Creatinine, Ser: 1.11 mg/dL — ABNORMAL HIGH (ref 0.44–1.00)
GFR, Estimated: 54 mL/min — ABNORMAL LOW (ref >60)
Glucose, Bld: 120 mg/dL — ABNORMAL HIGH (ref 70–99)
Potassium: 3.6 mmol/L (ref 3.5–5.1)
Sodium: 143 mmol/L (ref 135–145)

## 2021-07-19 LAB — CBC
HCT: 27.7 % — ABNORMAL LOW (ref 36.0–46.0)
Hemoglobin: 9.3 g/dL — ABNORMAL LOW (ref 12.0–15.0)
MCH: 28.2 pg (ref 26.0–34.0)
MCHC: 33.6 g/dL (ref 30.0–36.0)
MCV: 83.9 fL (ref 80.0–100.0)
Platelets: 104 10*3/uL — ABNORMAL LOW (ref 150–400)
RBC: 3.3 MIL/uL — ABNORMAL LOW (ref 3.87–5.11)
RDW: 17.1 % — ABNORMAL HIGH (ref 11.5–15.5)
WBC: 17.6 10*3/uL — ABNORMAL HIGH (ref 4.0–10.5)
nRBC: 4.5 % — ABNORMAL HIGH (ref 0.0–0.2)

## 2021-07-19 LAB — PATHOLOGIST SMEAR REVIEW

## 2021-07-19 LAB — MAGNESIUM: Magnesium: 2.2 mg/dL (ref 1.7–2.4)

## 2021-07-19 LAB — GLUCOSE, CAPILLARY
Glucose-Capillary: 114 mg/dL — ABNORMAL HIGH (ref 70–99)
Glucose-Capillary: 103 mg/dL — ABNORMAL HIGH (ref 70–99)

## 2021-07-19 MED ORDER — LORAZEPAM 2 MG/ML IJ SOLN
0.5000 mg/h | INTRAVENOUS | Status: DC
  Administered 2021-07-19: 0.5 mg/h via INTRAVENOUS
  Filled 2021-07-19: qty 25

## 2021-07-19 MED ORDER — MORPHINE 100MG IN NS 100ML (1MG/ML) PREMIX INFUSION
10.0000 mg/h | INTRAVENOUS | Status: DC
  Administered 2021-07-19: 10 mg/h via INTRAVENOUS
  Filled 2021-07-19: qty 100

## 2021-07-19 MED ORDER — POLYVINYL ALCOHOL 1.4 % OP SOLN
1.0000 [drp] | Freq: Four times a day (QID) | OPHTHALMIC | Status: DC | PRN
  Filled 2021-07-19: qty 15

## 2021-07-19 MED ORDER — DIPHENHYDRAMINE HCL 50 MG/ML IJ SOLN: 25.0000 mg | INTRAMUSCULAR | Status: DC | PRN

## 2021-07-19 MED ORDER — CALCIUM GLUCONATE-NACL 1-0.675 GM/50ML-% IV SOLN
1.0000 g | Freq: Once | INTRAVENOUS | Status: AC
  Administered 2021-07-19: 1000 mg via INTRAVENOUS
  Filled 2021-07-19 (×2): qty 50

## 2021-07-19 MED ORDER — GLYCOPYRROLATE 0.2 MG/ML IJ SOLN: 0.2000 mg | INTRAMUSCULAR | Status: DC | PRN

## 2021-07-19 MED ORDER — DEXTROSE 5 % IV SOLN: INTRAVENOUS | Status: DC

## 2021-07-19 MED ORDER — POTASSIUM CHLORIDE 20 MEQ PO PACK
40.0000 meq | PACK | Freq: Once | ORAL | Status: AC
  Administered 2021-07-19: 40 meq
  Filled 2021-07-19: qty 2

## 2021-07-19 MED ORDER — GLYCOPYRROLATE 1 MG PO TABS: 1.0000 mg | ORAL_TABLET | ORAL | Status: DC | PRN

## 2021-07-22 ENCOUNTER — Encounter (HOSPITAL_COMMUNITY): Payer: Self-pay | Admitting: Internal Medicine

## 2021-07-22 DIAGNOSIS — C3490 Malignant neoplasm of unspecified part of unspecified bronchus or lung: Secondary | ICD-10-CM

## 2021-07-22 DIAGNOSIS — E870 Hyperosmolality and hypernatremia: Secondary | ICD-10-CM | POA: Diagnosis not present

## 2021-07-22 HISTORY — DX: Malignant neoplasm of unspecified part of unspecified bronchus or lung: C34.90

## 2021-08-10 NOTE — Progress Notes (Signed)
1410, pt unresponsive to verbal and physical stimuli,  no respirations, absent heart sounds, pupils fixed and dilated.  Pronounced deceased at 31 with Cynthia [name]. Family at bedside

## 2021-08-10 NOTE — Progress Notes (Signed)
[facility] ADULT ICU REPLACEMENT PROTOCOL   The patient does apply for the [facility] Adult ICU Electrolyte Replacment Protocol based on the criteria listed below:   1.Exclusion criteria: TCTS patients, ECMO patients, and Dialysis patients 2. Is GFR >/= 30 ml/min? Yes.    Patient's GFR today is 54 3. Is SCr </= 2? Yes.   Patient's SCr is 1.11 mg/dL 4. Did SCr increase >/= 0.5 in 24 hours? No. 5.Pt's weight >40kg  Yes.   6. Abnormal electrolyte(s): K 3.6  7. Electrolytes replaced per protocol   Cynthia [name] 08/14/21 5:49 AM

## 2021-08-10 NOTE — Death Summary Note (Signed)
DEATH SUMMARY   Patient Details  Name: Cynthia [name] MRN: [id number] DOB: 03-Dec-1950  Admission/Discharge Information   Admit Date:  2021-08-03  Date of Death: Date of Death: 08/14/21  Time of Death: Time of Death: 14  Length of Stay: 09/15/2022  Referring Physician: Celene Squibb, MD   Reason(s) for Hospitalization  Acute Respiratory Failure with Hypoxia  Diagnoses  Preliminary cause of death:  Bilateral Squamous Cell Lung Cancer  Secondary Diagnoses (including complications and co-morbidities):  Principal Problem:   Squamous cell lung cancer ([place]) Active Problems:   Mixed hyperlipidemia   Essential hypertension, benign   Anxiety   Chronic obstructive pulmonary disease ([place])   Gastroesophageal reflux disease without esophagitis   CAP (community acquired pneumonia)   Sepsis ([place])   Acute respiratory failure with hypoxia ([place])   GI bleed   Nausea & vomiting   Diarrhea   Leukocytosis   Hypokalemia   Hypoalbuminemia due to protein-calorie malnutrition (HCC)   Prolonged QT interval   Sepsis due to undetermined organism ([place])   Lobar pneumonia ([place])   Aspiration into airway   Cerebral embolism with cerebral infarction   Pressure injury of skin   Hypernatremia   Brief Hospital Course (including significant findings, care, treatment, and services provided and events leading to death)  Cynthia [name] is a 71 y.o. year old female with hx of migraine headaches, anemia, osteoarthritis, GERD, depression, HCV, tobacco use with COPD. She was admitted 2021/08/03 with fever, fatigue and hypoxemic respiratory failure.  She required BiPAP support.  Imaging showed bilateral groundglass pulmonary infiltrates.  She was treated empirically for possible bacterial pneumonia.  Experienced progressive dyspnea and hypoxemia requiring intubation and mechanical ventilation.  She was treated with empiric corticosteroids for the possibility of inflammatory pneumonitis contributing to her  respiratory failure.  Course complicated by melena and GI bleeding for which she was seen by gastroenterology.  She also experienced a decline in her mental status and MRI brain confirmed multifocal strokes on 07/12/2021.  Other medical issues included hyponatremia which was treated with free water, steroid-induced hyperglycemia which was treated with supplemental insulin, nonoliguric acute renal failure.  She transferred to [facility] for further care 07/13/2021.  She had shock, felt to be multifactorial and in the setting of sedation for mechanical ventilation.  She failed extubation on 1/6 and was reintubated, underwent tracheostomy on 1/7.  Bronchoscopy was performed on 1/7 at the time of her tracheostomy to facilitate the procedure and also obtain BAL samples.  The BAL revealed squamous cell lung cancer on cytology.  It was felt that squamous cell lung cancer explained to her bilateral progressive infiltrates and was the cause of her acute respiratory failure.  Based on the new diagnosis of squamous cell lung cancer discussions were undertaken with the patient's family regarding her prognosis and goals for care.  Decision was made to transition to comfort care on Aug 14, 2021.  She expired at 14:10.     Pertinent Labs and Studies  Significant Diagnostic Studies CT HEAD WO CONTRAST (5MM)  Result Date: 07/12/2021 CLINICAL DATA:  Altered mental status EXAM: CT HEAD WITHOUT CONTRAST TECHNIQUE: Contiguous axial images were obtained from the base of the skull through the vertex without intravenous contrast. COMPARISON:  08/29/2013 FINDINGS: Brain: There are no signs of bleeding within the cranium. There is small subtle focus of decreased density in the left frontal cortex which was not evident in the previous study. There is no significant focal mass effect. Cortical sulci are prominent. Vascular: Unremarkable. Skull:  Unremarkable. Sinuses/Orbits: Unremarkable Other: None IMPRESSION: There are no signs of bleeding  within the cranium. There is a new small focus of low attenuation in the left frontal cortex suggesting possible recent or old infarct in the left MCA distribution. If clinically warranted, follow-up MRI may be considered. Imaging findings were relayed to Dr. Carles Collet by telephone call. Electronically Signed   By: Elmer Picker M.D.   On: 07/12/2021 16:12   CT Angio Chest Pulmonary Embolism (PE) W or WO Contrast  Result Date: 07/09/2021 CLINICAL DATA:  Positive D-dimer with pulmonary embolism suspected EXAM: CT ANGIOGRAPHY CHEST WITH CONTRAST TECHNIQUE: Multidetector CT imaging of the chest was performed using the standard protocol during bolus administration of intravenous contrast. Multiplanar CT image reconstructions and MIPs were obtained to evaluate the vascular anatomy. CONTRAST:  157m OMNIPAQUE IOHEXOL 350 MG/ML SOLN COMPARISON:  10/22/2018 FINDINGS: Cardiovascular: Satisfactory opacification of the pulmonary arteries to the segmental level. No evidence of pulmonary embolism when accounting for unavoidable motion artifact. Normal heart size. No pericardial effusion. Main pulmonary is enlarged to 4 cm in diameter. Atheromatous calcification of the aorta. Mediastinum/Nodes: No adenopathy or mass.  Benign-appearing thyroid Lungs/Pleura: Ground-glass opacity bilateral lungs which are relatively low volume. No honeycombing noted. No edema, effusion, or pneumothorax. Upper Abdomen: Negative Musculoskeletal: No acute or aggressive finding Review of the MIP images confirms the above findings. IMPRESSION: 1. Bilateral pneumonia. 2. Negative for pulmonary embolism when accounting for motion artifact. Electronically Signed   By: JJorje GuildM.D.   On: 07/09/2021 04:07   MR ANGIO HEAD WO CONTRAST  Result Date: 07/12/2021 CLINICAL DATA:  Stroke suspected, possible infarct on same-day CT EXAM: MRI HEAD WITHOUT CONTRAST MRA HEAD WITHOUT CONTRAST TECHNIQUE: Multiplanar, multi-echo pulse sequences of the brain  and surrounding structures were acquired without intravenous contrast. Angiographic images of the Circle of Willis were acquired using MRA technique without intravenous contrast. COMPARISON:  Prior MRI, correlation is made with 07/12/2021 CT head FINDINGS: MRI HEAD FINDINGS Evaluation is somewhat limited by motion artifact. Brain: Multiple foci of restricted diffusion in the bilateral cerebral and cerebellar hemispheres, with the largest area in the posterior left frontal cortex (series 5, images 21-29). Additional smaller areas in the bilateral cerebellar hemispheres (series 5, images 6-8), bilateral posterior occipital lobes (series 5, images 10-19), right greater than left parietal lobes (series 5, images 21-23 and 25-29), and bilateral frontal lobes (series 5, image 20 and 24-29). The aforementioned foci of restricted diffusion demonstrate ADC correlates, most likely acute and early subacute infarcts. Increased T2 signal is associated with several of these lesions, most prominently in the posterior left cortical lesion. No mass, mass effect, or midline shift. No hydrocephalus or extra-axial collection. Scattered T2 hyperintense signal in the periventricular white matter, likely the sequela of chronic small vessel ischemic disease. Vascular: Please see MRA findings below. Skull and upper cervical spine: Normal marrow signal. Sinuses/Orbits: Negative. Other: Fluid in left mastoid air cells. MRA HEAD FINDINGS Evaluation is significantly limited by motion artifact. Anterior circulation: Both internal carotid arteries are poorly evaluated, secondary to motion artifact. Evaluation of the remainder of the anterior circulation is also limited secondary to motion; within this limitation, the left A1 is definitely patent. Distal MCA branches appear grossly patent. For evaluation of the bilateral M1 segments. Distal MCA branches appear perfused. Posterior circulation: Left dominant system. The proximal V4 segments are not  included in the field of view. The basilar is grossly patent from the vertebrobasilar junction to the bifurcation. Flow is noted within  the bilateral PCAs. The SCAs and PICAs are not well seen. Anatomic variants: None significant IMPRESSION: 1. 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 The largest areas of infarction is in the posterior left frontal cortex. No evidence of hemorrhagic transformation. No mass effect or midline shift. Given multiple vascular territories, an embolic etiology is suspected. 2. Evaluation of the intracranial vasculature is significantly limited by motion artifact. No definite large vessel occlusion is seen. These results were called by telephone at the time of interpretation on 07/12/2021 at 6:11 pm to provider DAVID TAT , who verbally acknowledged these results. Electronically Signed   By: Merilyn Baba M.D.   On: 07/12/2021 18:11   MR BRAIN WO CONTRAST  Result Date: 07/12/2021 CLINICAL DATA:  Stroke suspected, possible infarct on same-day CT EXAM: MRI HEAD WITHOUT CONTRAST MRA HEAD WITHOUT CONTRAST TECHNIQUE: Multiplanar, multi-echo pulse sequences of the brain and surrounding structures were acquired without intravenous contrast. Angiographic images of the Circle of Willis were acquired using MRA technique without intravenous contrast. COMPARISON:  Prior MRI, correlation is made with 07/12/2021 CT head FINDINGS: MRI HEAD FINDINGS Evaluation is somewhat limited by motion artifact. Brain: Multiple foci of restricted diffusion in the bilateral cerebral and cerebellar hemispheres, with the largest area in the posterior left frontal cortex (series 5, images 21-29). Additional smaller areas in the bilateral cerebellar hemispheres (series 5, images 6-8), bilateral posterior occipital lobes (series 5, images 10-19), right greater than left parietal lobes (series 5, images 21-23 and 25-29), and bilateral frontal lobes (series 5, image 20 and  24-29). The aforementioned foci of restricted diffusion demonstrate ADC correlates, most likely acute and early subacute infarcts. Increased T2 signal is associated with several of these lesions, most prominently in the posterior left cortical lesion. No mass, mass effect, or midline shift. No hydrocephalus or extra-axial collection. Scattered T2 hyperintense signal in the periventricular white matter, likely the sequela of chronic small vessel ischemic disease. Vascular: Please see MRA findings below. Skull and upper cervical spine: Normal marrow signal. Sinuses/Orbits: Negative. Other: Fluid in left mastoid air cells. MRA HEAD FINDINGS Evaluation is significantly limited by motion artifact. Anterior circulation: Both internal carotid arteries are poorly evaluated, secondary to motion artifact. Evaluation of the remainder of the anterior circulation is also limited secondary to motion; within this limitation, the left A1 is definitely patent. Distal MCA branches appear grossly patent. For evaluation of the bilateral M1 segments. Distal MCA branches appear perfused. Posterior circulation: Left dominant system. The proximal V4 segments are not included in the field of view. The basilar is grossly patent from the vertebrobasilar junction to the bifurcation. Flow is noted within the bilateral PCAs. The SCAs and PICAs are not well seen. Anatomic variants: None significant IMPRESSION: 1. 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 The largest areas of infarction is in the posterior left frontal cortex. No evidence of hemorrhagic transformation. No mass effect or midline shift. Given multiple vascular territories, an embolic etiology is suspected. 2. Evaluation of the intracranial vasculature is significantly limited by motion artifact. No definite large vessel occlusion is seen. These results were called by telephone at the time of interpretation on 07/12/2021 at 6:11 pm to  provider DAVID TAT , who verbally acknowledged these results. Electronically Signed   By: Merilyn Baba M.D.   On: 07/12/2021 18:11   US Carotid Bilateral  Result Date: 07/13/2021 CLINICAL DATA:  Acute ischemic stroke EXAM: BILATERAL CAROTID DUPLEX ULTRASOUND TECHNIQUE: Pearline Cables scale  imaging, color Doppler and duplex ultrasound were performed of bilateral carotid and vertebral arteries in the neck. COMPARISON:  None. FINDINGS: Criteria: Quantification of carotid stenosis is based on velocity parameters that correlate the residual internal carotid diameter with NASCET-based stenosis levels, using the diameter of the distal internal carotid lumen as the denominator for stenosis measurement. The following velocity measurements were obtained: RIGHT ICA: 86/17 cm/sec CCA: 16/1 cm/sec SYSTOLIC ICA/CCA RATIO:  0.9 ECA:  87 cm/sec LEFT ICA: 149/21 cm/sec CCA: 096/04 cm/sec SYSTOLIC ICA/CCA RATIO:  1.0 ECA:  127 cm/sec RIGHT CAROTID ARTERY: Focal heterogeneous atherosclerotic plaque in the proximal internal carotid artery. By peak systolic velocity criteria, the estimated stenosis is less than 50%. RIGHT VERTEBRAL ARTERY:  Patent with normal antegrade flow. LEFT CAROTID ARTERY: No significant atherosclerotic plaque or evidence of stenosis in the proximal internal carotid artery. LEFT VERTEBRAL ARTERY:  Patent with normal antegrade flow. IMPRESSION: Mild (1-49%) stenosis proximal right internal carotid artery secondary to heterogenous atherosclerotic plaque. No significant atherosclerotic plaque or evidence of stenosis in the left internal carotid artery. Vertebral arteries are patent with normal antegrade flow. Electronically Signed   By: Jacqulynn Cadet M.D.   On: 07/13/2021 16:11   DG Chest Port 1 View  Result Date: 08/06/21 CLINICAL DATA:  Respiratory failure and hypoxia. EXAM: PORTABLE CHEST 1 VIEW COMPARISON:  Portable chest 07/16/2021. FINDINGS: 3:46 a.m., 2021/08/06. Tracheostomy cannula tip remains 3 cm from  the carina. Enteric tube is well inside the stomach, partially looped within the stomach with the tip pointing cephalad superimposing inferior to the medial left hemidiaphragm. Right PICC tip remains in the upper right atrium. The heart is enlarged. Central vessels appear normal in caliber. Interstitial and diffuse hazy airspace disease of the lung fields with relative apical sparing persists unchanged. Bilateral minimal pleural effusions are also similar. No new or worsening abnormality is seen. IMPRESSION: No significant change in bilateral hazy airspace disease with underlying interstitial consolidation, minimal pleural effusions, and positioning of support tubes. Right PICC is new from prior study with the tip in the upper right atrium. Electronically Signed   By: Telford Nab M.D.   On: 2021-08-06 05:44   DG Chest Port 1 View  Result Date: 07/16/2021 CLINICAL DATA:  Post tracheostomy.  NG tube placement. EXAM: PORTABLE CHEST 1 VIEW COMPARISON:  07/16/2021 FINDINGS: Tracheostomy tip projects over the mid to lower trachea. No pneumothorax. NG tube is in the stomach. Bilateral interstitial and airspace opacities diffusely, similar to prior study. Heart is normal size. No effusions. IMPRESSION: Tracheostomy and NG tube in expected position. No pneumothorax. Otherwise no significant change. Electronically Signed   By: Rolm Baptise M.D.   On: 07/16/2021 19:07   DG Chest Port 1 View  Result Date: 07/16/2021 CLINICAL DATA:  ARDS. EXAM: PORTABLE CHEST 1 VIEW COMPARISON:  07/14/2021 FINDINGS: 0403 hours. Diffuse interstitial and hazy alveolar opacity is stable to minimally improved in the interval. No pneumothorax or pleural effusion. Endotracheal tube and NG tube have been removed since prior. Telemetry leads overlie the chest. IMPRESSION: 1. Interval extubation. 2. Stable to mildly improved interstitial and basilar alveolar opacity. Electronically Signed   By: Misty Stanley M.D.   On: 07/16/2021 05:01   DG  Chest Port 1 View  Result Date: 07/14/2021 CLINICAL DATA:  Difficulty breathing EXAM: PORTABLE CHEST 1 VIEW COMPARISON:  Previous studies including the examination of 07/13/2021 FINDINGS: Tip of endotracheal tube is 3.2 cm above the carina. Enteric tube is noted traversing the esophagus. Diffuse increased interstitial  markings are noted in both lungs with no significant interval change. Findings suggest interstitial pneumonia and possibly interstitial edema. There is no new focal pulmonary consolidation. Costophrenic angles are clear. There is no pneumothorax. IMPRESSION: No significant interval changes are noted in diffuse increased interstitial markings in both lungs. Electronically Signed   By: Elmer Picker M.D.   On: 07/14/2021 08:05   DG CHEST PORT 1 VIEW  Result Date: 07/13/2021 CLINICAL DATA:  Intubated, abnormal chest x-ray EXAM: PORTABLE CHEST 1 VIEW COMPARISON:  07/13/2021 FINDINGS: Single frontal view of the chest demonstrates endotracheal tube overlying tracheal air column tip 2.4 cm above carina. Enteric catheter passes below diaphragm tip excluded by collimation. The cardiac silhouette is stable. Persistent bilateral interstitial and ground-glass opacities, greatest at the lung bases. No effusion or pneumothorax. No acute bony abnormalities. IMPRESSION: 1. Support devices as above. 2. Diffuse interstitial and ground-glass opacities, greatest at the bases. Differential remains edema, infection, or widespread aspiration. Electronically Signed   By: Randa Ngo M.D.   On: 07/13/2021 19:13   DG CHEST PORT 1 VIEW  Result Date: 07/13/2021 CLINICAL DATA:  Shortness of breath EXAM: PORTABLE CHEST 1 VIEW COMPARISON:  07/12/2021 FINDINGS: There is interval placement of endotracheal tube with its tip 3.7 cm above the carina. Enteric tube is noted traversing the esophagus. Diffuse increase in interstitial markings are seen in both lungs, more so on the left side with no significant change. There  is no focal consolidation. Lateral CP angles are clear. There is no pneumothorax. IMPRESSION: Diffuse prominence of interstitial markings in both lungs has not changed significantly. This may suggest interstitial pneumonia. Less likely possibility would be interstitial pulmonary edema. No new focal pulmonary consolidation is seen. Electronically Signed   By: Elmer Picker M.D.   On: 07/13/2021 12:04   DG CHEST PORT 1 VIEW  Result Date: 07/12/2021 CLINICAL DATA:  NG tube placement. EXAM: PORTABLE CHEST 1 VIEW COMPARISON:  Chest x-ray 07/12/2021. FINDINGS: Enteric tube tip extends below the diaphragm into the stomach. The distal tip is not included on the image. Interstitial opacities throughout the mid and lower lungs appear unchanged. There is no pleural effusion or pneumothorax. Cardiomediastinal silhouette is within normal limits. No acute fractures are seen. IMPRESSION: 1. Enteric tube extends below the diaphragm into the stomach, but the distal tip is not included on the image. 2. Unchanged diffuse bilateral lung infiltrates. Electronically Signed   By: Ronney Asters M.D.   On: 07/12/2021 22:24   DG CHEST PORT 1 VIEW  Result Date: 07/12/2021 CLINICAL DATA:  Shortness of breath. EXAM: PORTABLE CHEST 1 VIEW COMPARISON:  July 11, 2021 FINDINGS: Marked severity diffuse bilateral infiltrates. This is slightly more prominent within the bilateral lung bases and is increased in severity when compared to the prior study. There is no evidence of a pleural effusion or pneumothorax. The heart size and mediastinal contours are within normal limits. The visualized skeletal structures are unremarkable. IMPRESSION: Marked severity diffuse bilateral infiltrates, increased in severity when compared to the prior study. Electronically Signed   By: Virgina Norfolk M.D.   On: 07/12/2021 00:28   DG CHEST PORT 1 VIEW  Result Date: 07/11/2021 CLINICAL DATA:  Acute respiratory failure and hypoxia.  Pneumonia. EXAM:  PORTABLE CHEST 1 VIEW COMPARISON:  07/09/2021 FINDINGS: Stable cardiomediastinal contours. Bilateral increase interstitial opacities are identified, left greater than right. The appearance is unchanged from previous exam. No new findings. IMPRESSION: No change in aeration to the lungs compared with previous exam. Electronically  Signed   By: Kerby Moors M.D.   On: 07/11/2021 06:39   DG CHEST PORT 1 VIEW  Result Date: 07/09/2021 CLINICAL DATA:  Worsening SOB. Hx of bronchitis, COPD, HTN, pneumonia, and current smoker of 0.25 packs a day x 42 years. Negative covid, flu, and RSV yesterday. EXAM: PORTABLE CHEST 1 VIEW COMPARISON:  CT angio chest 07/09/2021, chest x-ray 06/20/2021 FINDINGS: The heart and mediastinal contours are unchanged. Aortic calcification. Interval increased interstitial and airspace opacities with prominence within the mid to lower lungs. No pleural effusion. No pneumothorax. No acute osseous abnormality. IMPRESSION: Interval increase in marked interstitial and airspace opacities with prominence within the mid to lower lungs. Findings consistent with pulmonary edema. Electronically Signed   By: Iven Finn M.D.   On: 07/09/2021 22:13   DG Chest Portable 1 View  Result Date: 06/27/2021 CLINICAL DATA:  Shortness of breath.  Sepsis workup. EXAM: PORTABLE CHEST 1 VIEW COMPARISON:  Chest CT with contrast and portable chest both 10/22/2018. FINDINGS: There is mild cardiomegaly, normal caliber central vessels. There is a low inspiration. Interstitial and patchy hazy opacities in the left mid to lower lung field are noted most likely due to pneumonia. The remaining hypoinflated lungs are clear. No pleural effusion is seen. There is calcification of the transverse aorta. Thoracic spondylosis. IMPRESSION: Interstitial and patchy hazy opacities of the left mid to lower lung field, most likely due to pneumonia. This could all be in the upper lobe or could be a combination of upper and lower  lobe disease. Clinical correlation and radiographic follow-up recommended. Limited view of the lung bases due to low inspiration Electronically Signed   By: Telford Nab M.D.   On: 06/29/2021 21:50   EEG adult  Result Date: 07/17/2021 Greta Doom, MD     07/17/2021  2:59 PM History: 71 year old female being evaluated for AMS Sedation: Fentanyl Technique: This is a 21 channel routine scalp EEG performed at the bedside with bipolar and monopolar montages arranged in accordance to the international 10/20 system of electrode placement. One channel was dedicated to EKG recording. Background: The background consists of generalized irregular delta and theta range activities.  There is no definite posterior dominant rhythm seen. There is a single discharge with a wide left hemispheric field which could be suggestive of cortical irritability, but in isolation, and with an unusual field, I favor an artifactual nature. Photic stimulation: Physiologic driving is not performed EEG Abnormalities: 1) Generalized irregular slow activity. 2) Absent PDR Clinical Interpretation: This EEG is consistent with a generalized non-specific cerebral dysfunction(encephalopathy). There was no seizure or seizure predisposition recorded on this study. Roland Rack, MD [facility] [phone] If 7pm- 7am, please page neurology on call as listed in [place].   VAS [place] TRANSCRANIAL DOPPLER W BUBBLES  Result Date: 07/15/2021  Transcranial Doppler with Bubble Patient Name:  RONDI IVY  Date of Exam:   07/15/2021 Medical Rec #: [id number]          Accession #:    [id number] Date of Birth: 1951/01/05          Patient Gender: F Patient Age:   27 years Exam Location:  [facility] Procedure:      VAS [place] TRANSCRANIAL [place] Referring Phys: Janine Ores --------------------------------------------------------------------------------  Indications: Stroke. Comparison Study: no prior Performing  Technologist: Archie Patten RVS  Examination Guidelines: A complete evaluation includes B-mode imaging, spectral Doppler, color Doppler, and power Doppler as needed of all accessible portions of each  vessel. Bilateral testing is considered an integral part of a complete examination. Limited examinations for reoccurring indications may be performed as noted.  Summary: No HITS at rest or during Valsalva. Clinically insignificant. Negative transcranial Doppler Bubble study with no evidence of right to left intracardiac communication.  A vascular evaluation was performed. The right middle cerebral artery was studied. An IV was inserted into the patient's right forearm . Verbal informed consent was obtained.  *See table(s) above for TCD measurements and observations.  Diagnosing physician: Antony Contras MD Electronically signed by Antony Contras MD on 07/15/2021 at 6:05:33 PM.    Final    ECHOCARDIOGRAM COMPLETE  Result Date: 07/10/2021    ECHOCARDIOGRAM REPORT   Patient Name:   MARGARETANN ABATE Date of Exam: 07/10/2021 Medical Rec #:  [id number]         Height:       63.0 in Accession #:    [id number]        Weight:       133.6 lb Date of Birth:  [birth year]         BSA:          1.629 m Patient Age:    37 years          BP:           123/55 mmHg Patient Gender: F                 HR:           77 bpm. Exam Location:  [facility] Procedure: 2D Echo, Cardiac Doppler and Color Doppler Indications:    CHF  History:        Patient has prior history of Echocardiogram examinations, most                 recent 06/08/2017. COPD; Risk Factors:Hypertension and                 Dyslipidemia. Tobacco abuse.  Sonographer:    Wenda Low Referring Phys: [phone] DAVID TAT IMPRESSIONS  1. Left ventricular ejection fraction, by estimation, is 60 to 65%. Left ventricular ejection fraction by PLAX is 64 %. The left ventricle has normal function. The left ventricle has no regional wall motion abnormalities. There is mild concentric left  ventricular hypertrophy. Left ventricular diastolic parameters are consistent with Grade I diastolic dysfunction (impaired relaxation).  2. Right ventricular systolic function is normal. The right ventricular size is normal. There is normal pulmonary artery systolic pressure.  3. The mitral valve is normal in structure. Trivial mitral valve regurgitation. No evidence of mitral stenosis.  4. The aortic valve is normal in structure. Aortic valve regurgitation is trivial. No aortic stenosis is present.  5. The inferior vena cava is dilated in size with >50% respiratory variability, suggesting right atrial pressure of 8 mmHg. FINDINGS  Left Ventricle: Left ventricular ejection fraction, by estimation, is 60 to 65%. Left ventricular ejection fraction by PLAX is 64 %. The left ventricle has normal function. The left ventricle has no regional wall motion abnormalities. The left ventricular internal cavity size was normal in size. There is mild concentric left ventricular hypertrophy. Left ventricular diastolic parameters are consistent with Grade I diastolic dysfunction (impaired relaxation). Right Ventricle: The right ventricular size is normal. No increase in right ventricular wall thickness. Right ventricular systolic function is normal. There is normal pulmonary artery systolic pressure. The tricuspid regurgitant velocity is 2.44 m/s, and  with an assumed right atrial pressure of 8  mmHg, the estimated right ventricular systolic pressure is 15.8 mmHg. Left Atrium: Left atrial size was normal in size. Right Atrium: Right atrial size was normal in size. Pericardium: There is no evidence of pericardial effusion. Mitral Valve: The mitral valve is normal in structure. Trivial mitral valve regurgitation. No evidence of mitral valve stenosis. MV peak gradient, 6.2 mmHg. The mean mitral valve gradient is 2.0 mmHg. Tricuspid Valve: The tricuspid valve is normal in structure. Tricuspid valve regurgitation is trivial. No evidence  of tricuspid stenosis. Aortic Valve: The aortic valve is normal in structure. Aortic valve regurgitation is trivial. No aortic stenosis is present. Aortic valve mean gradient measures 5.0 mmHg. Aortic valve peak gradient measures 9.5 mmHg. Aortic valve area, by VTI measures 2.16 cm. Pulmonic Valve: The pulmonic valve was normal in structure. Pulmonic valve regurgitation is not visualized. No evidence of pulmonic stenosis. Aorta: The aortic root is normal in size and structure. Venous: The inferior vena cava is dilated in size with greater than 50% respiratory variability, suggesting right atrial pressure of 8 mmHg. IAS/Shunts: No atrial level shunt detected by color flow Doppler.  LEFT VENTRICLE PLAX 2D LV EF:         Left            Diastology                ventricular     LV e' medial:  6.31 cm/s                ejection        LV e' lateral: 8.16 cm/s                fraction by                PLAX is 64                %. LVIDd:         4.30 cm LVIDs:         2.80 cm LV PW:         1.20 cm LV IVS:        1.70 cm LVOT diam:     2.00 cm LV SV:         77 LV SV Index:   47 LVOT Area:     3.14 cm  LV Volumes (MOD) LV vol d, MOD    39.6 ml A2C: LV vol d, MOD    42.4 ml A4C: LV vol s, MOD    12.1 ml A2C: LV vol s, MOD    13.7 ml A4C: LV SV MOD A2C:   27.5 ml LV SV MOD A4C:   42.4 ml LV SV MOD BP:    28.3 ml RIGHT VENTRICLE RV Basal diam:  3.50 cm RV Mid diam:    2.70 cm RV S prime:     12.90 cm/s TAPSE (M-mode): 2.4 cm LEFT ATRIUM             Index        RIGHT ATRIUM          Index LA diam:        3.40 cm 2.09 cm/m   RA Area:     9.87 cm LA Vol (A2C):   25.5 ml 15.65 ml/m  RA Volume:   17.20 ml 10.56 ml/m LA Vol (A4C):   23.9 ml 14.67 ml/m LA Biplane Vol: 27.3 ml 16.76 ml/m  AORTIC VALVE  PULMONIC VALVE AV Area (Vmax):    2.28 cm     PV Vmax:       0.89 m/s AV Area (Vmean):   2.15 cm     PV Peak grad:  3.1 mmHg AV Area (VTI):     2.16 cm AV Vmax:           154.00 cm/s AV Vmean:           97.500 cm/s AV VTI:            0.357 m AV Peak Grad:      9.5 mmHg AV Mean Grad:      5.0 mmHg LVOT Vmax:         112.00 cm/s LVOT Vmean:        66.600 cm/s LVOT VTI:          0.245 m LVOT/AV VTI ratio: 0.69  AORTA Ao Root diam: 3.20 cm Ao Asc diam:  2.60 cm MITRAL VALVE              TRICUSPID VALVE MV Area (PHT): 3.24 cm   TR Peak grad:   23.8 mmHg MV Area VTI:   2.16 cm   TR Vmax:        244.00 cm/s MV Peak grad:  6.2 mmHg MV Mean grad:  2.0 mmHg   SHUNTS MV Vmax:       1.24 m/s   Systemic VTI:  0.24 m MV Vmean:      69.4 cm/s  Systemic Diam: 2.00 cm Skeet Latch MD Electronically signed by Skeet Latch MD Signature Date/Time: 07/10/2021/11:53:33 AM    Final    ECHOCARDIOGRAM LIMITED BUBBLE STUDY  Result Date: 07/14/2021    ECHOCARDIOGRAM LIMITED REPORT   Patient Name:   CHARLOTTA LAPAGLIA Date of Exam: 07/14/2021 Medical Rec #:  [id number]         Height:       63.0 in Accession #:    [id number]        Weight:       128.3 lb Date of Birth:  September 21, 1950         BSA:          1.601 m Patient Age:    67 years          BP:           112/43 mmHg Patient Gender: F                 HR:           60 bpm. Exam Location:  [facility]patient Procedure: Limited Echo and Saline Contrast Bubble Study Indications:    Stroke 434.91 / I63.9  History:        Patient has prior history of Echocardiogram examinations, most                 recent 07/10/2021. COPD; Risk Factors:Hypertension.  Sonographer:    Bernadene Person RDCS Referring Phys: [id number] [place]  1. Limited exam.  2. Right ventricular systolic function is normal. The right ventricular size is normal.  3. Few large bubbles seen in L sided chambers after injection of agitated saline intravenously consistent with very small interatrial shunt.. Agitated saline contrast bubble study was positive with shunting observed within 3-6 cardiac cycles suggestive of interatrial shunt. FINDINGS  Right Ventricle: The right ventricular size is normal. Right vetricular wall  thickness was not assessed. Right ventricular systolic function is normal. Left Atrium: Left atrial size was  normal in size. Right Atrium: Right atrial size was normal in size. Pericardium: There is no evidence of pericardial effusion. IAS/Shunts: Agitated saline contrast was given intravenously to evaluate for intracardiac shunting. Agitated saline contrast bubble study was positive with shunting observed within 3-6 cardiac cycles suggestive of interatrial shunt. Dorris Carnes MD Electronically signed by Dorris Carnes MD Signature Date/Time: 07/14/2021/2:28:14 PM    Final    VAS [place] LOWER EXTREMITY VENOUS (DVT)  Result Date: 07/14/2021  Lower Venous DVT Study Patient Name:  CAMARYN LUMBERT  Date of Exam:   07/14/2021 Medical Rec #: [id number]          Accession #:    [id number] Date of Birth: [birth year]          Patient Gender: F Patient Age:   45 years Exam Location:  [facility] Procedure:      VAS [place] LOWER EXTREMITY VENOUS (DVT) Referring Phys: Alferd [facility] --------------------------------------------------------------------------------  Indications: Stroke.  Risk Factors: None identified. Limitations: Poor ultrasound/tissue interface and patient positioning, patient constant movement. Comparison Study: No prior studies. Performing Technologist: Oliver Hum RVT  Examination Guidelines: A complete evaluation includes B-mode imaging, spectral Doppler, color Doppler, and power Doppler as needed of all accessible portions of each vessel. Bilateral testing is considered an integral part of a complete examination. Limited examinations for reoccurring indications may be performed as noted. The reflux portion of the exam is performed with the patient in reverse Trendelenburg.  +---------+---------------+---------+-----------+----------+--------------+  RIGHT     Compressibility Phasicity Spontaneity Properties Thrombus Aging  +---------+---------------+---------+-----------+----------+--------------+  CFV        Full            Yes       Yes                                    +---------+---------------+---------+-----------+----------+--------------+  SFJ       Full                                                             +---------+---------------+---------+-----------+----------+--------------+  FV Prox   Full                                                             +---------+---------------+---------+-----------+----------+--------------+  FV Mid    Full                                                             +---------+---------------+---------+-----------+----------+--------------+  FV Distal Full                                                             +---------+---------------+---------+-----------+----------+--------------+  PFV       Full                                                             +---------+---------------+---------+-----------+----------+--------------+  POP       Full            Yes       Yes                                    +---------+---------------+---------+-----------+----------+--------------+  PTV       Full                                                             +---------+---------------+---------+-----------+----------+--------------+  PERO      Full                                                             +---------+---------------+---------+-----------+----------+--------------+   +---------+---------------+---------+-----------+----------+--------------+  LEFT      Compressibility Phasicity Spontaneity Properties Thrombus Aging  +---------+---------------+---------+-----------+----------+--------------+  CFV       Full            Yes       Yes                                    +---------+---------------+---------+-----------+----------+--------------+  SFJ       Full                                                             +---------+---------------+---------+-----------+----------+--------------+  FV Prox   Full                                                              +---------+---------------+---------+-----------+----------+--------------+  FV Mid    Full                                                             +---------+---------------+---------+-----------+----------+--------------+  FV Distal Full                                                             +---------+---------------+---------+-----------+----------+--------------+  PFV       Full                                                             +---------+---------------+---------+-----------+----------+--------------+  POP       Full            Yes       Yes                                    +---------+---------------+---------+-----------+----------+--------------+  PTV       Full                                                             +---------+---------------+---------+-----------+----------+--------------+  PERO      Full                                                             +---------+---------------+---------+-----------+----------+--------------+     Summary: RIGHT: - There is no evidence of deep vein thrombosis in the lower extremity. However, portions of this examination were limited- see technologist comments above.  - No cystic structure found in the popliteal fossa.  LEFT: - There is no evidence of deep vein thrombosis in the lower extremity. However, portions of this examination were limited- see technologist comments above.  - No cystic structure found in the popliteal fossa.  *See table(s) above for measurements and observations. Electronically signed by Jamelle Haring on 07/14/2021 at 4:01:12 PM.    Final    [place] EKG SITE RITE  Result Date: 07/17/2021 If Site Rite image not attached, placement could not be confirmed due to current cardiac rhythm.   Microbiology Recent Results (from the past 240 hour(s))  MRSA Next Gen by PCR, Nasal     Status: None   Collection Time: 07/13/21  6:33 PM   Specimen: Nasal Mucosa; Nasal Swab  Result Value Ref Range Status    MRSA by PCR Next Gen NOT DETECTED NOT DETECTED Final    Comment: (NOTE) The GeneXpert MRSA Assay (FDA approved for NASAL specimens only), is one component of a comprehensive MRSA colonization surveillance program. It is not intended to diagnose MRSA infection nor to guide or monitor treatment for MRSA infections. Test performance is not FDA approved in patients less than 48 years old. Performed at [facility], [place] [address] [place], [place] [address]   Culture, Respiratory w Gram Stain     Status: None   Collection Time: 07/14/21  3:36 PM   Specimen: Bronchoalveolar Lavage; Respiratory  Result Value Ref Range Status   Specimen Description BRONCHIAL ALVEOLAR LAVAGE  Final   Special Requests NONE  Final   Gram Stain   Final    RARE WBC PRESENT, PREDOMINANTLY PMN NO ORGANISMS SEEN    Culture   Final    NO  GROWTH 2 DAYS Performed at [facility], [place] [address], [place], [place] [address]    Report Status 07/16/2021 FINAL  Final  Culture, Respiratory w Gram Stain     Status: None   Collection Time: 07/16/21  5:27 PM   Specimen: Bronchoalveolar Lavage; Respiratory  Result Value Ref Range Status   Specimen Description BRONCHIAL ALVEOLAR LAVAGE  Final   Special Requests NONE  Final   Gram Stain   Final    NO WBC SEEN RARE YEAST WITH PSEUDOHYPHAE Performed at [facility], [address], [place], [place] [address]    Culture RARE CANDIDA ALBICANS  Final   Report Status 07/18/2021 FINAL  Final    Lab Basic Metabolic Panel: Recent Labs  Lab 07/15/21 1236 07/16/21 0121 07/17/21 0147 07/17/21 1437 07/17/21 2232 07/18/21 0417 08-10-2021 0355  NA 148* 156* 153* 150* 150* 147* 143  K 3.6 3.2* 3.7 4.0 3.8 3.7 3.6  CL 119* 123* 121*  --   --  112* 109  CO2 _0 --   --  26 27  GLUCOSE 187* 110* 106*  --   --  142* 120*  BUN 50* 48* 44*  --   --  49* 47*  CREATININE 1.45* 1.16* 1.12*  --   --  1.19* 1.11*  CALCIUM 9.3 9.6 8.4*  --   --  8.0* 7.7*  MG   --  2.1 2.1  --   --  2.1 2.2  PHOS  --  2.0* 4.4  --   --  5.9* 4.7*   Liver Function Tests: No results for input(s): AST, ALT, ALKPHOS, BILITOT, PROT, ALBUMIN in the last 168 hours. No results for input(s): LIPASE, AMYLASE in the last 168 hours. No results for input(s): AMMONIA in the last 168 hours. CBC: Recent Labs  Lab 07/16/21 0121 07/17/21 0147 07/17/21 1419 07/17/21 1437 07/17/21 2206 07/17/21 2232 07/18/21 0417 Aug 10, 2021 0355  WBC 17.4* 21.7* 19.6*  --   --   --  16.6* 17.6*  HGB 9.1* 8.0* 6.7* 7.8* 10.9* 10.2* 10.0* 9.3*  HCT 26.5* 23.7* 20.5* 23.0* 31.5* 30.0* 29.3* 27.7*  MCV 81.8 82.6 84.7  --   --   --  83.2 83.9  PLT 153 152 136*  --   --   --  126* 104*   Cardiac Enzymes: No results for input(s): CKTOTAL, CKMB, CKMBINDEX, TROPONINI in the last 168 hours. Sepsis Labs: Recent Labs  Lab 07/17/21 0147 07/17/21 1419 07/18/21 0417 Aug 10, 2021 0355  WBC 21.7* 19.6* 16.6* 17.6*       Amier Hoyt S Ailed Defibaugh 07/22/2021, 11:16 AM
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
## 2021-08-10 NOTE — Progress Notes (Signed)
71 year old woman admitted 12/30 with known COPD, transferred from [facility] where she presented with hypoxia, fevers, bilateral infiltrates, failed BiPAP, high flow oxygen and intubated 1/4, underwent tracheostomy 1/7 after failed extubation .  Course complicated by bleeding from tracheostomy, nonoliguric AKI and steroid-induced hyperglycemia, persistent encephalopathy with MRI showing acute embolic strokes and small interatrial shunt on transthoracic echo.  BAL subsequently showed squamous cell carcinoma  Remains critically ill, with trach , sedated on fentanyl On exam-bilateral ventilated breath sounds, minimal secretions, no rhonchi, S1-S2 regular, soft nontender abdomen.  Labs show normal electrolytes, decrease in creatinine to 1.1, persistent leukocytosis, stable anemia and slight drop in thrombocytopenia.  Chest x-ray 1/20 independently reviewed shows PICC line in position, persistent bilateral airspace disease.  Impression/plan New diagnosis of squamous cell lung cancer -not a candidate for treatment at present given acute critical issues doubt that she will ever reach treatment.  Acute hypoxemic respiratory failure with tracheostomy  Acute metabolic encephalopathy -no change requires fentanyl drip AKI improving  Detailed family discussion by my partner yesterday and poor prognosis given, DNR issued.  Based on further discussion today plan is to transition to comfort once all family members have had a chance to visit  My independent critical care time x 23m Samara Stankowski V. AElsworth SohoMD

## 2021-08-10 NOTE — Progress Notes (Signed)
RT NOTE: RT removed patient from ventilator per order and family wishes. Patient now transitioning to comfort care. RN and family at bedside with patient.

## 2021-08-10 NOTE — Progress Notes (Signed)
NAME:  JOLLY CARLINI, MRN:  [id number], DOB:  1951/01/06, LOS: 63 ADMISSION DATE:  06/25/2021, CONSULTATION DATE:  2021/08/12 REFERRING MD:  [facility], CHIEF COMPLAINT:  respiratory failure   History of Present Illness:  CHRYS LANDGREBE is a 71 y.o. woman with past medical history of tobacco use disorder. Presented to [facility] with hypoxemia, fevers, fatigue.  She has history of COPD.  Started on therapy for sepsis from community acquired pneumonia.  Needed Bipap and high flow oxygen.  PCCM consulted to assist with respiratory management.  Pertinent  Medical History  GERD, HLD, Migraine headaches, Anemia, Anxiety, OA, Depression, HCV, HTN, PTSD, Cocaine abuse in remission, Chronic pain  Significant Hospital Events: Including procedures, antibiotic start and stop dates in addition to other pertinent events   12/30 Admit 12/31 start intermittent Bipap use, start precedex 01/01 GI consult for melena 01/03 increased dyspnea/hypoxia; start solumedrol; altered mental status with MRI showing multifocal strokes 01/04 intubated, start peripheral pressors, transferred to [facility] hospital 1/6 extubation trial 1/7 [place] talk, tracheostomy 1/10 transition to full comfort.   Interim History / Subjective:  Seems uncomfortable    Objective   Blood pressure (Abnormal) 104/51, pulse (Abnormal) 53, temperature 97.9 F (36.6 C), temperature source Axillary, resp. rate (Abnormal) 30, height _0  (1.6 m), weight 64.8 kg, SpO2 97 %.    Vent Mode: PRVC FiO2 (%):  [60 %-100 %] 100 % Set Rate:  [30 bmp] 30 bmp Vt Set:  [420 mL] 420 mL PEEP:  [5 cmH20] 5 cmH20 Pressure Support:  [8 cmH20] 8 cmH20 Plateau Pressure:  [20 cmH20-26 cmH20] 26 cmH20   Intake/Output Summary (Last 24 hours) at August 12, 2021 0951 Last data filed at August 12, 2021 0700 Gross per 24 hour  Intake 3294.92 ml  Output 1650 ml  Net 1644.92 ml   Filed Weights   07/16/21 0442 07/17/21 0448 08-12-21 0600  Weight: 58.2 kg 57.2 kg 64.8 kg     Examination: General 71 year old female. Will open eyes. W/d to pain.  HENT NCAT no JVD trach not remarkable  Pulm scattered rhonchi Card rrr Abd soft  Ext warm  Neuro weak on right.w/d to pain. Not responding    Resolved Hospital Problem list   ABLA, bleeding trach-   Assessment & Plan:  Acute hypoxemic respiratory failure 2/2 diffuse pulmonary infiltrates. BAL + squamous Cell Lung cancer raising concern for lymphangitic spread    Tracheostomy status  Heme positive stool Acute metabolic encephalopathy/comatose s/p  acute embolic strokes with small inter-atrial shunt found on TTE 1/5 Shock.  Multifactorial but suspect significant sedation effect with associated bradycardia Hypernatremia Steroid induced hyperglycemia-  AKI, non oliguric DNR status Comfort care  Discussion  Remains comatose. Still requiring high level of vent support. Long d.w family today (see separate note). This has been a devastating insult w/ probability of life long debilitation almost a certainty. Based on my discussion w/ family she would not want to endure life like this.   Plan Transition to comfort care Morphine and ativan infusion Stop all rx that does not impact comfort.  Remove vent when looks more comfortable   Erick Colace ACNP-BC [place] Pager # [phone] OR # [phone] if no answer   Best Practice (right click and "Reselect all SmartList Selections" daily)   Diet/type: resume TF DVT prophylaxis: SCDs given bleeding GI prophylaxis: PPI Lines: N/A Foley:  N/A Code Status:  full code Last date of multidisciplinary goals of care discussion [see note 07/16/21]

## 2021-08-10 NOTE — Progress Notes (Addendum)
STROKE TEAM PROGRESS NOTE   INTERVAL HISTORY Family has decided to proceed with comfort care. Patient is resting with eyes closed. Unresponsive to noxious stimuli. Family is at the bedside. All questions related to the patients stroke were answered.   Vitals:   08-10-21 1100 08-10-2021 1127 08/10/21 1200 August 10, 2021 1300  BP: (!) 116/57 (!) 116/57 118/62 114/60  Pulse: (!) 52 (!) 53 (!) 56 (!) 58  Resp: (!) 27 (!) 28 (!) 27 (!) 27  Temp:      TempSrc:      SpO2: 100% 100% 100% 100%  Weight:      Height:       CBC:  Recent Labs  Lab 07/18/21 0417 Aug 10, 2021 0355  WBC 16.6* 17.6*  HGB 10.0* 9.3*  HCT 29.3* 27.7*  MCV 83.2 83.9  PLT 126* 104*    Basic Metabolic Panel:  Recent Labs  Lab 07/18/21 0417 2021-08-10 0355  NA 147* 143  K 3.7 3.6  CL 112* 109  CO2 26 27  GLUCOSE 142* 120*  BUN 49* 47*  CREATININE 1.19* 1.11*  CALCIUM 8.0* 7.7*  MG 2.1 2.2  PHOS 5.9* 4.7*    Lipid Panel:  Recent Labs  Lab 07/13/21 0432 07/18/21 0417  CHOL 78  --   TRIG 144 95  HDL 11*  --   CHOLHDL 7.1  --   VLDL 29  --   LDLCALC 38  --     HgbA1c:  Recent Labs  Lab 07/13/21 0432  HGBA1C 5.5    Urine Drug Screen: No results for input(s): LABOPIA, COCAINSCRNUR, LABBENZ, AMPHETMU, THCU, LABBARB in the last 168 hours.  Alcohol Level No results for input(s): ETH in the last 168 hours.  IMAGING past 24 hours DG Chest Port 1 View  Result Date: 2021/08/10 CLINICAL DATA:  Respiratory failure and hypoxia. EXAM: PORTABLE CHEST 1 VIEW COMPARISON:  Portable chest 07/16/2021. FINDINGS: 3:46 a.m., 2021/08/10. Tracheostomy cannula tip remains 3 cm from the carina. Enteric tube is well inside the stomach, partially looped within the stomach with the tip pointing cephalad superimposing inferior to the medial left hemidiaphragm. Right PICC tip remains in the upper right atrium. The heart is enlarged. Central vessels appear normal in caliber. Interstitial and diffuse hazy airspace disease of the lung  fields with relative apical sparing persists unchanged. Bilateral minimal pleural effusions are also similar. No new or worsening abnormality is seen. IMPRESSION: No significant change in bilateral hazy airspace disease with underlying interstitial consolidation, minimal pleural effusions, and positioning of support tubes. Right PICC is new from prior study with the tip in the upper right atrium. Electronically Signed   By: Telford Nab M.D.   On: 2021/08/10 05:44    PHYSICAL EXAM  Temp:  [96.5 F (35.8 C)-97.9 F (36.6 C)] 97.9 F (36.6 C) (01/10 0707) Pulse Rate:  [46-132] 58 (01/10 1300) Resp:  [12-32] 27 (01/10 1300) BP: (98-141)/(50-80) 114/60 (01/10 1300) SpO2:  [65 %-100 %] 100 % (01/10 1300) FiO2 (%):  [60 %-100 %] 100 % (01/10 1127) Weight:  [64.8 kg] 64.8 kg (01/10 0600)  General - thin built, well developed, trached, opened eyes once to noxious stimuli. Eyes midline, not following commands or responding to verbal stimuli.  Ophthalmologic - fundi not visualized due to noncooperation.  Cardiovascular - Regular rhythm and rate.  Neuro - eyes closed, not open on voice, grimace with pain stimulation but not open eyes. Non verbal, not answer questions. With forced eye opening, pupil equal but sluggish to light,  eyes midline with upward gaze, not blinking to visual threat. Positive corneals. Intermittent coughing. Facial symmetrical. With pain stimulation, BLE withdraw equally. LUE localize to pain and against gravity. RUE mild withdraw to pain but not against gravity. Sensation, coordination and gait not tested.   ASSESSMENT/PLAN Cynthia [name] is a 71 y.o. female with history of COPD, HTN, Depression, GERD, HTN, migraines, hx of tobacco use who was admitted to [facility] with SOB with exertion along with nausea and vomiting and a fever of 102.74F. She was found to have Pneumonia and acute GI bleed. Blood cultures were sent and have so far been negative as well as  respiratory culture.  She is on antibiotics including Zosyn and doxycycline.  GI was consulted for the GI bleed, they are deferring the endoscopy until her respiratory status improves. She developed encephalopathy during admission and an MRI Brain demonstrated multple embolic appearing strokes. She was intubated for worsening SOB and mentation. She was [facility] to [facility] for further evaluation and management 07/13/2021.  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  She was trached and subsequently bronched on 07/16/2021. She was sedated after her trach due to excessive bleeding from her trach site. TCB bubble showed no evidence of a shunt, but Echo with bubble did. Need TEE to further diagnosis PFO.  As of 1/10 family has decided to transition to full comfort care once they have all had time to visit.   Stroke:  embolic shower in b/l anterior and posterior distribution, etiology unclear likely secondary to a cardioembolic source CT head -There is a new small focus of low attenuation in the left frontal cortex suggesting possible recent or old infarct in the left MCA distribution MRI-  multiple acute and subacute infarcts in the bilateral cerebral and cerebellar hemispheres.  Largest infarction in the left posterior frontal cortex. MRA  Motion degraded, no sign of LVO Carotid Doppler unremarkable 2D Echo EF 60-65%.  Small left-to-right shunt. TCD bubble study - No HITS at rest or during Valsalva.Bubble study with no evidence of right to left intracardiac communication.  Venous Duplex LE-negative for DVT  Recommend TEE to further evaluate PFO once stable and to rule out endocarditis Repeat MRI pending LDL 38 HgbA1c 5.5 VTE prophylaxis - SCDs No antithrombotic prior to admission, was on aspirin 300 PR, now hold for trach bleeding Therapy recommendations:  pending Disposition:  pending  Fever  Leukocytosis Pneumonia Tmax 102.4 PTA -> afebrile WBC  14.6->17.4-> 21.7-> 19.6->16.6-> 17.6 Initial CXR concerning for diffuse bilateral lung infiltrates Management per CCM Was on Zosyn and doxycycline, now off Recommend TEE once stable to rule out endocarditis  Respiratory failure S/p tracheostomy 07/16/2021 with Dr. Tamala Julian after failed extubation Tracheostomy bleeding s/p PRBC, now stable Still on sedation  Management per CCM  AMS Likely multifactorial encephalopathy May be related to stroke, AKI, fever, leukocytosis, GI bleeding EEG encephalopathy, no seizure repeat MRI brain pending Consider TEE once stable  GIB Dysphagia Dark stool please fecal occult blood positive However, no significant bleeding so far GI on board - 07/10/21 -> monitor H&H and consider transfusion if hemoglobin drops below 8 g. Esophagogastroduodenoscopy when patient deemed to be stable from respiratory standpoint. H/H- 8.9/26.7->9.1/26.5 ->10.0/29.3 Continue aspirin 300 PR for now Tube feeding and free water on hold Put on IV fluid  Trach bleeding Anemia Management per CCM Hb 8.0-6.7-7.8-> 10.0 -> 9.3 Status post PRBC transfusion CBC monitoring  Hypertension Home meds: Norvasc 10 mg, propranolol  80 mg, Lasix 20 mg Stable Long-term BP goal normotensive  Bradycardia On dopamine CCM following  Hyperlipidemia Home meds: Atorvastatin 40 mg, resumed in hospital LDL 38, goal < 70 High intensity statin not indicated given LDL at goal Continue statin at discharge  Tobacco abuse Current smoker Smoking cessation counseling will be provided  Other Stroke Risk Factors Advanced Age >/= 38  Migraines  Other Active Problems Hypernatremia - Na 148->156-> 153-> 150->147 on FW -> 143 AKI Cr.  1.45 ->1.54->1.16-> 1.12->1.19 -> 1.11   Hospital day # 11  Patient seen and examined by NP/APP with MD. MD to update note as needed.   Cynthia Ores, DNP, FNP-BC [facility] Pager: [phone]  ATTENDING NOTE: I reviewed above note and agree  with the assessment and plan. Pt was seen and examined.   Family decided comfort care measures. MRI will be cancelled. Neurology will sign off. Please call with questions. Thanks for the consult.   For detailed assessment and plan, please refer to above as I have made changes wherever appropriate.   Cynthia Hawking, MD PhD Stroke Neurology 18-Aug-2021 9:09 PM    To contact Stroke Continuity provider, please refer to [email or web address] After hours, contact General Neurology

## 2021-08-10 NOTE — Progress Notes (Signed)
eLink Physician-Brief Progress Note Patient Name: Cynthia [name] DOB: Feb 25, 1951 MRN: [id number]   Date of Service  07/28/2021  HPI/Events of Note  Serum Calcium .7 gm / dl.  eICU Interventions  Calcium gluconate 1 gm iv bolus ordered.        Kerry Kass [name] 28-Jul-2021, 5:58 AM

## 2021-08-10 NOTE — TOC Progression Note (Signed)
Transition of Care [facility]) - Progression Note    Patient Details  Name: Cynthia [name] MRN: [id number] Date of Birth: Nov 27, 1950  Transition of Care [facility]) CM/SW [place], RN Phone Number:[phone]  2021-08-17, 9:55 AM  Clinical Narrative:    TOC will continue to follow and assist as needed for patient that is transitioning to comfort care.     Barriers to Discharge: Continued Medical Work up  Expected Discharge Plan and Services                                                 Social Determinants of Health (SDOH) Interventions    Readmission Risk Interventions Readmission Risk Prevention Plan 2021/08/17  Transportation Screening Not Complete  Transportation Screening Comment family discusing comfort care with MD  PCP or Specialist Appt within 3-5 Days Not Complete  Not Complete comments transitioning to comfort care  [place] or [place] Not Complete  HRI or Home Care Consult comments transotioning to comfort care  Social Work Consult for [place] Planning/Counseling Complete  Palliative Care Screening Complete  Medication Review [employer] photographer) Not Complete  Med Review Comments transitioning to comfort care  Some recent data might be hidden

## 2021-08-10 DEATH — deceased

## 2021-08-11 IMAGING — CR DG HAND COMPLETE 3+V*R*
3 series · 3 of 3 positions shown · non-contrast
Comparison: None.

CLINICAL DATA: Patient with pain status post fall.

EXAM:
RIGHT HAND - COMPLETE 3+ VIEW

[pa]
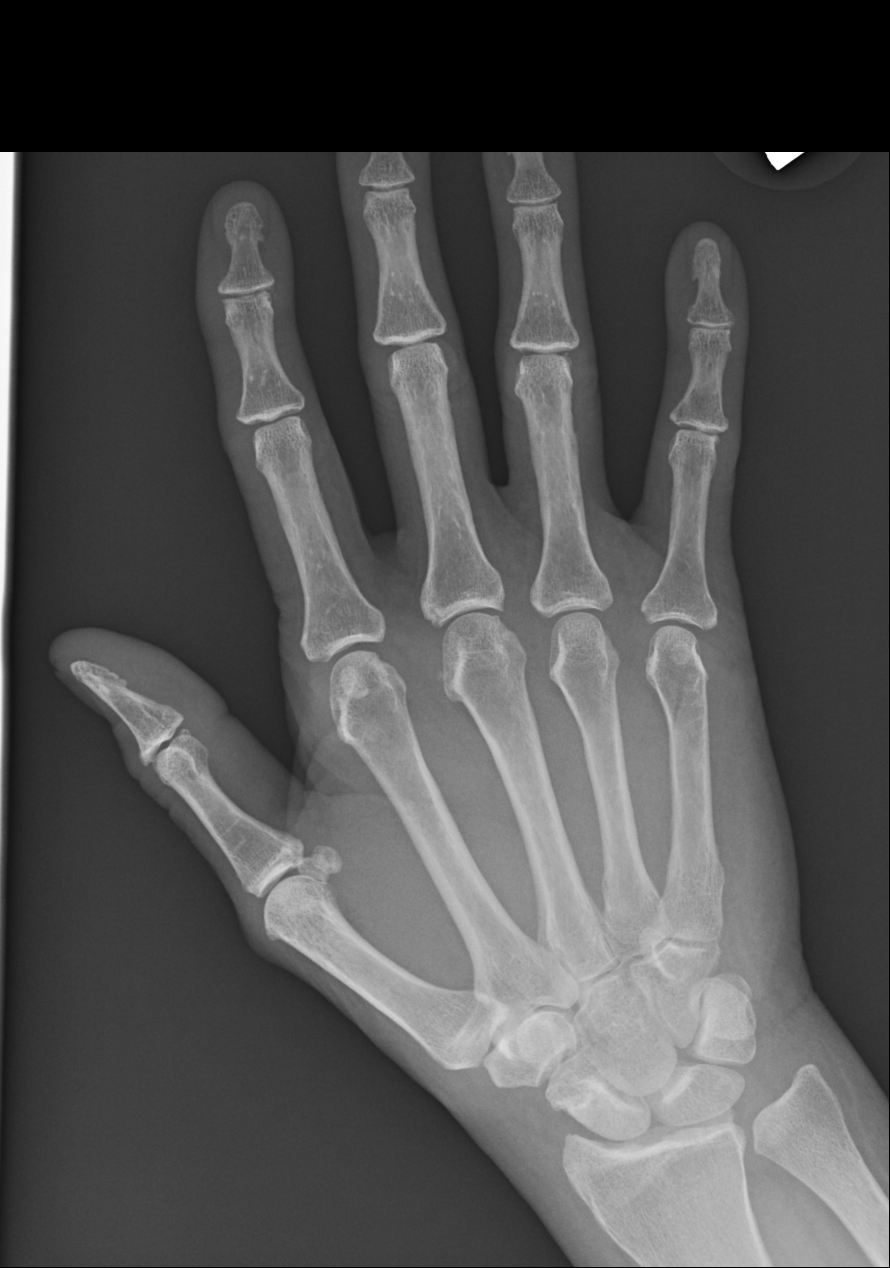

[oblique]
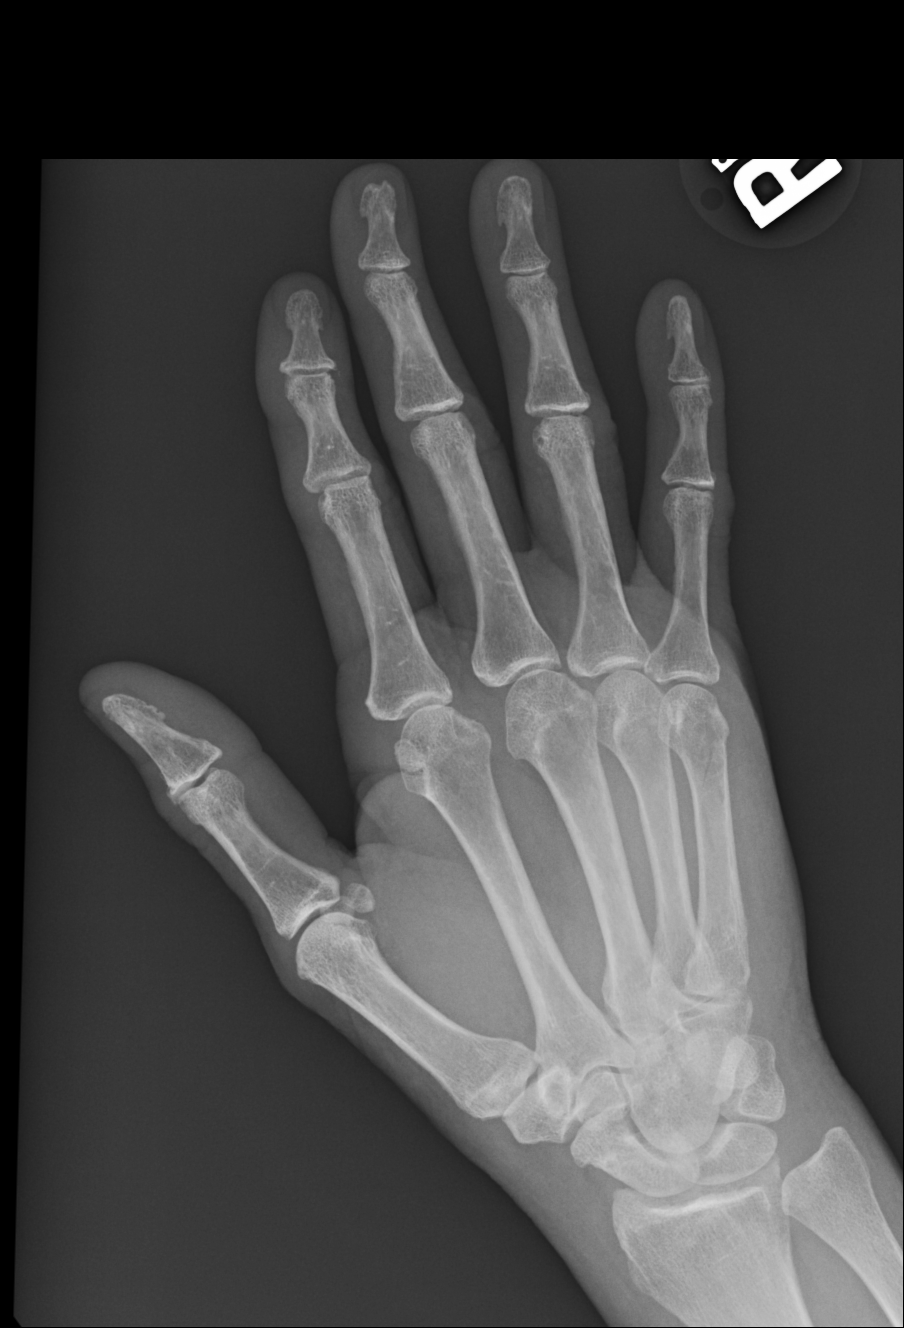

[lat]
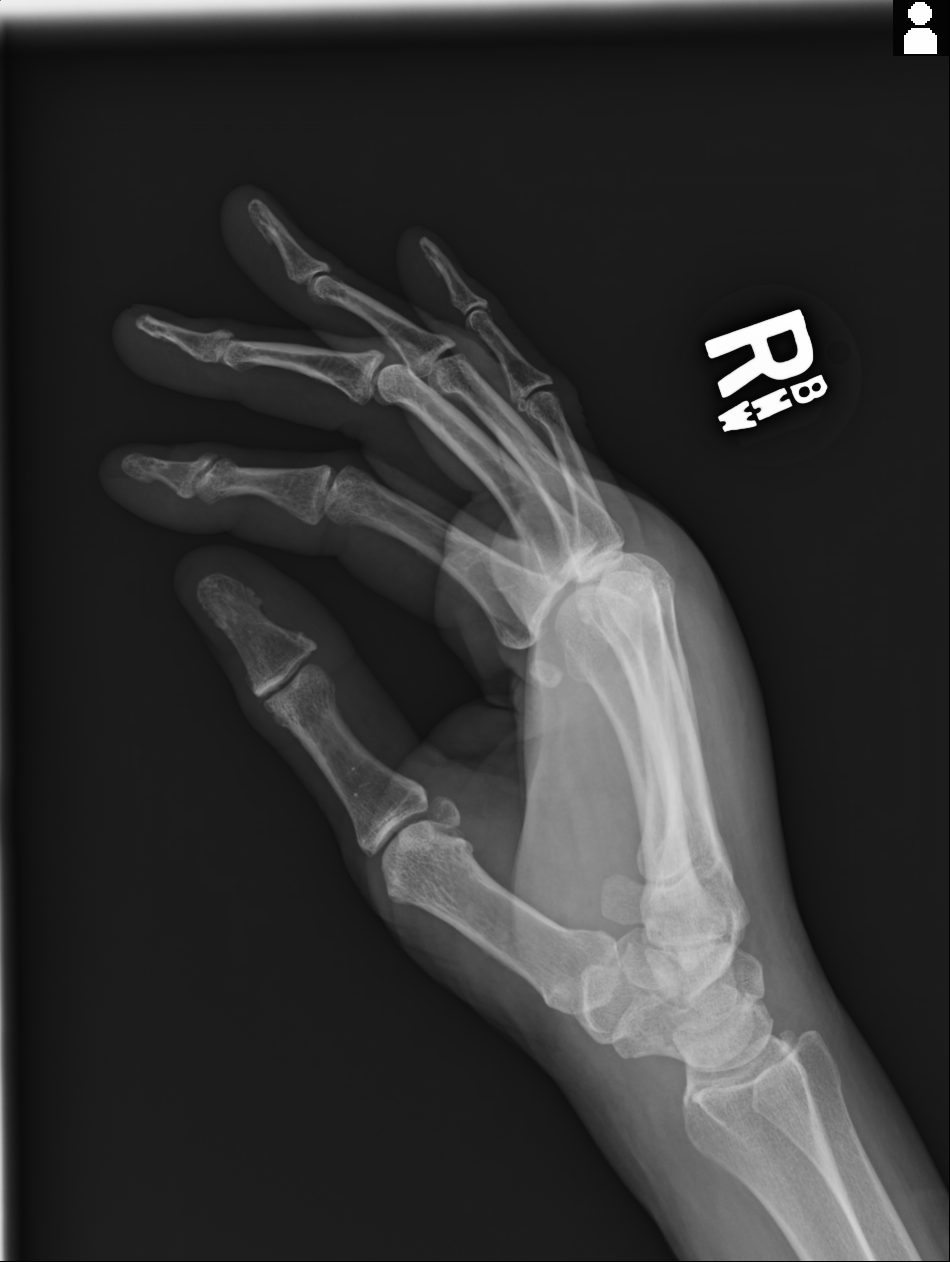

[3 of 3 positions shown; findings below may reference images not displayed]

FINDINGS: Nondisplaced oblique fracture through the distal aspect of the fifth
metacarpal. No evidence for associated acute fractures. Swelling
over the dorsum of the hand.
IMPRESSION: Nondisplaced oblique fracture distal aspect of the fifth metacarpal.

## 2021-10-26 ENCOUNTER — Ambulatory Visit: Payer: Medicare Other | Admitting: Gastroenterology

## 2022-06-27 IMAGING — US US ABDOMEN COMPLETE
1 series · 14 of 25 positions shown · non-contrast
Comparison: 08/29/2016

CLINICAL DATA: Elevated liver function tests. Previously treated
hepatitis C virus.

EXAM:
ABDOMEN ULTRASOUND COMPLETE

[Series 1: us abdomen complete · 14 of 100 slices shown]
[im 1/100]
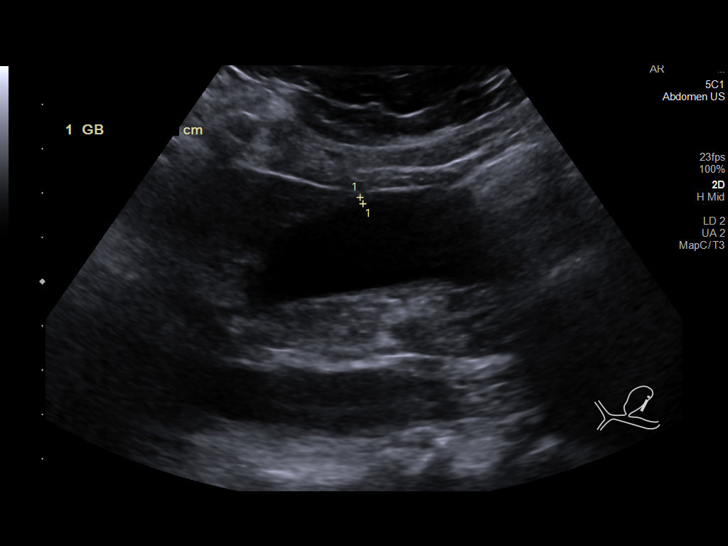
[im 9/100]
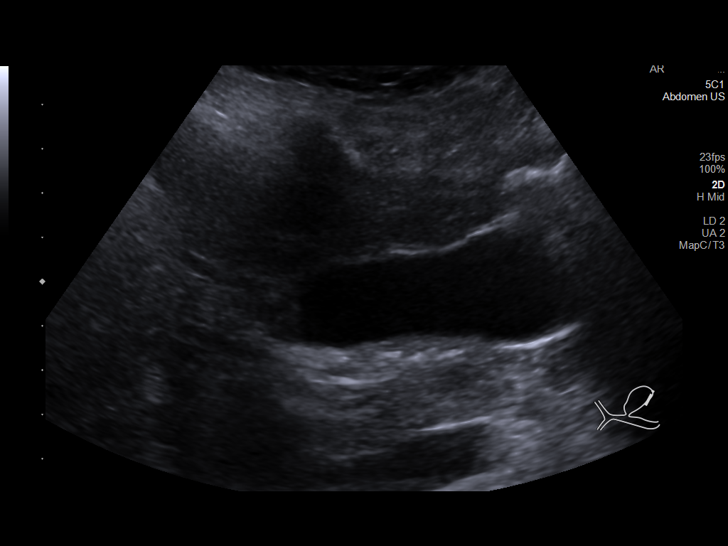
[im 17/100]
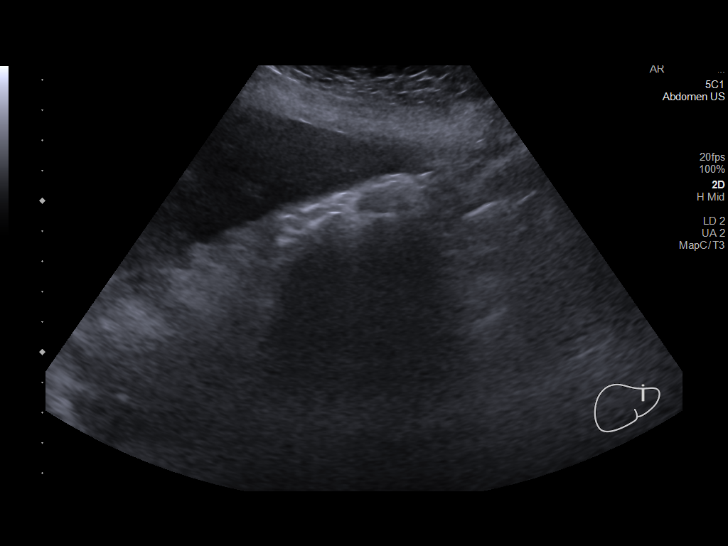
[im 25/100]
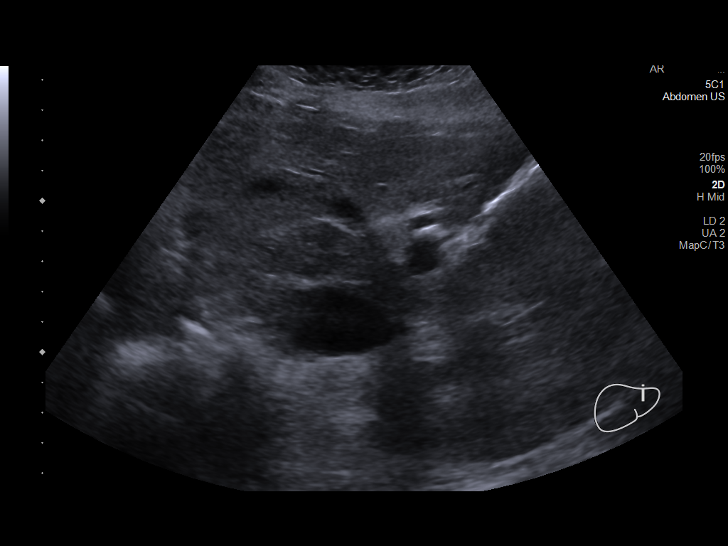
[im 34/100]
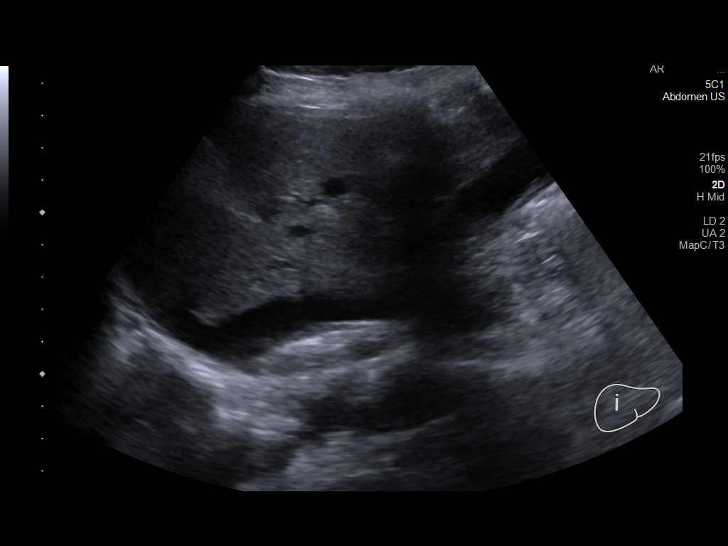
[im 38/100]
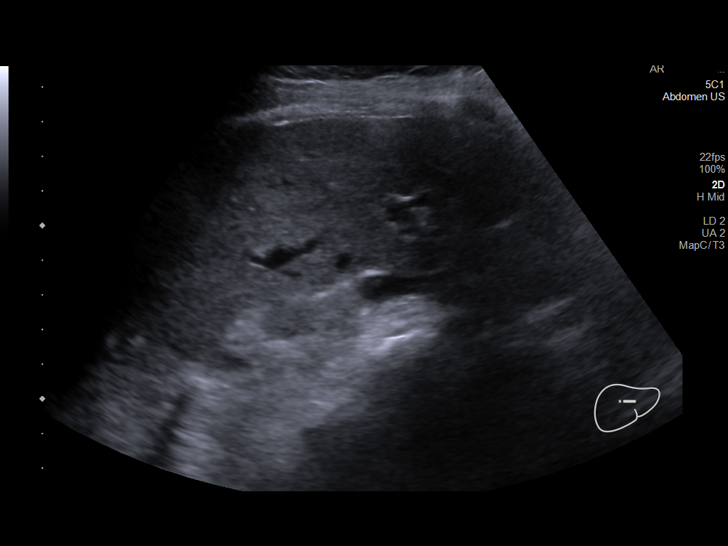
[im 46/100]
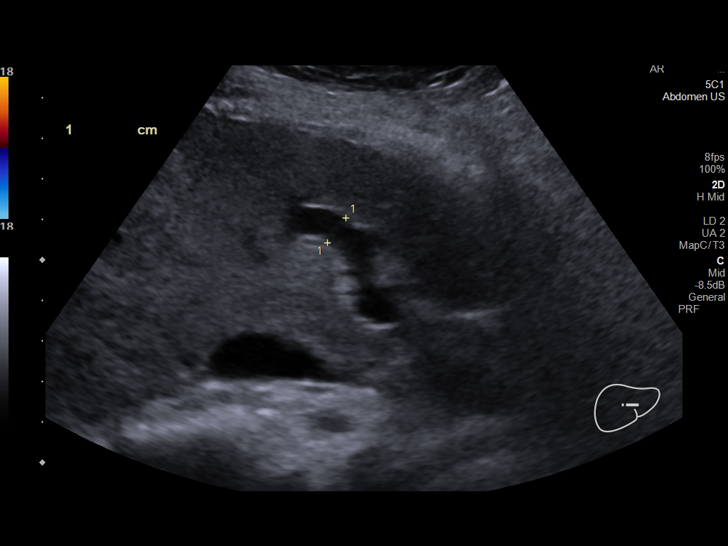
[im 54/100]
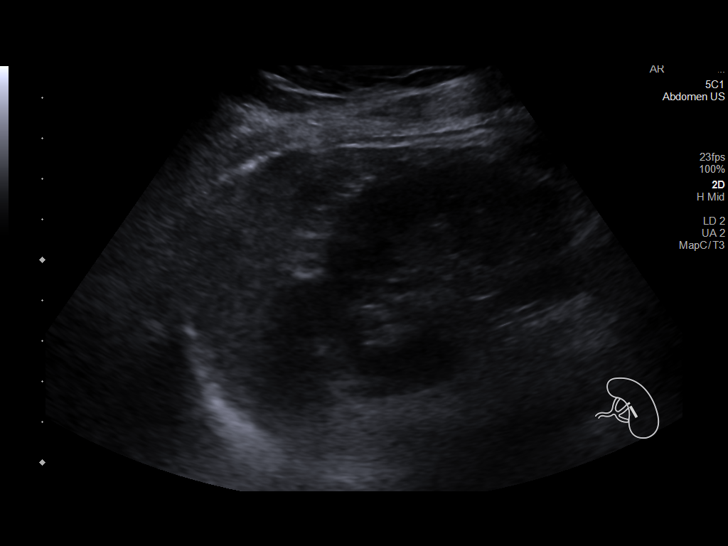
[im 62/100]
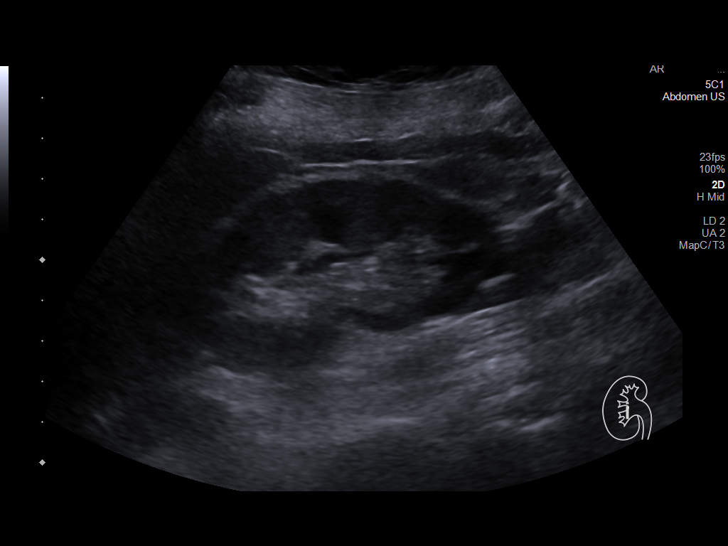
[im 67/100]
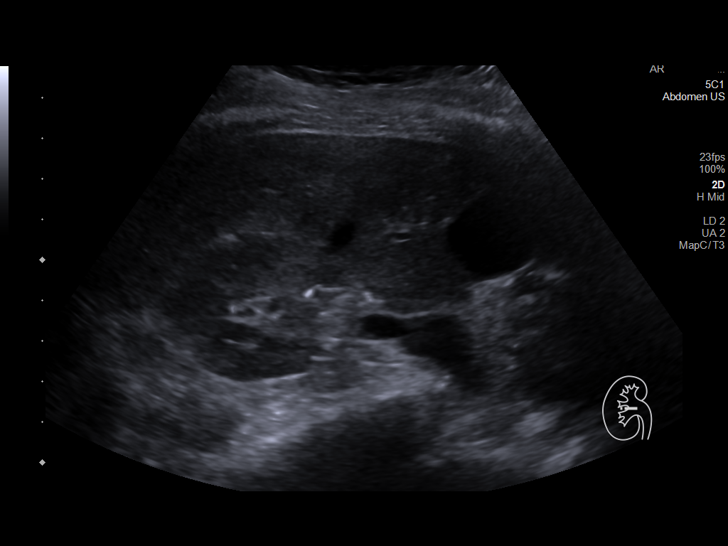
[im 75/100]
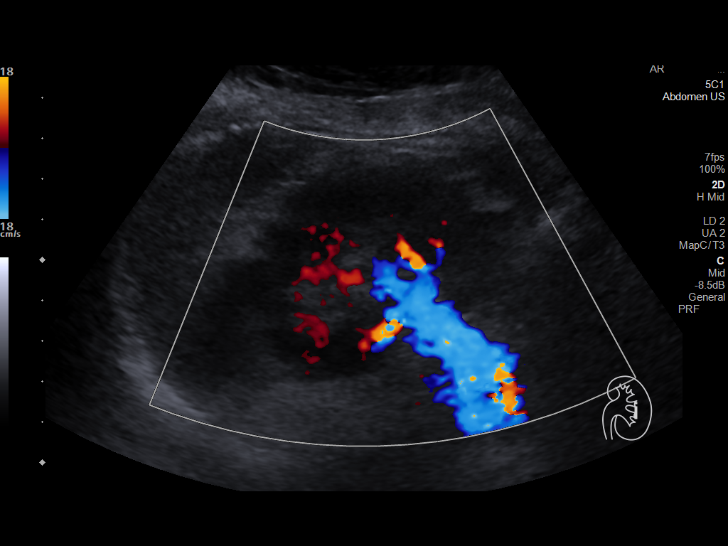
[im 83/100]
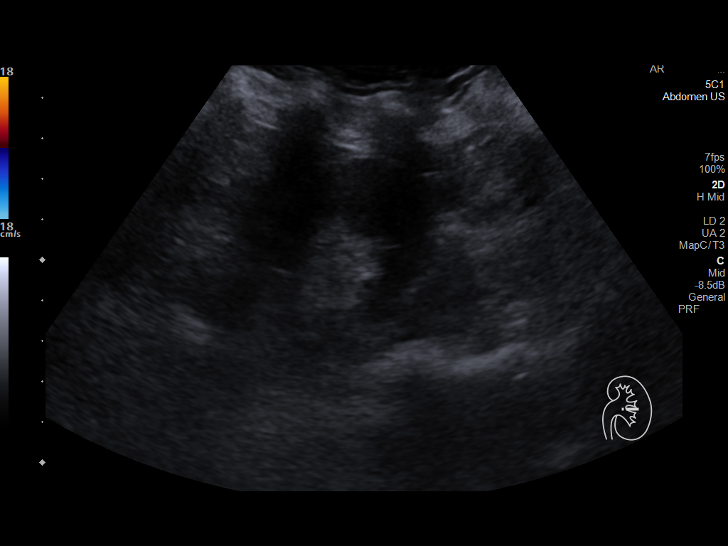
[im 91/100]
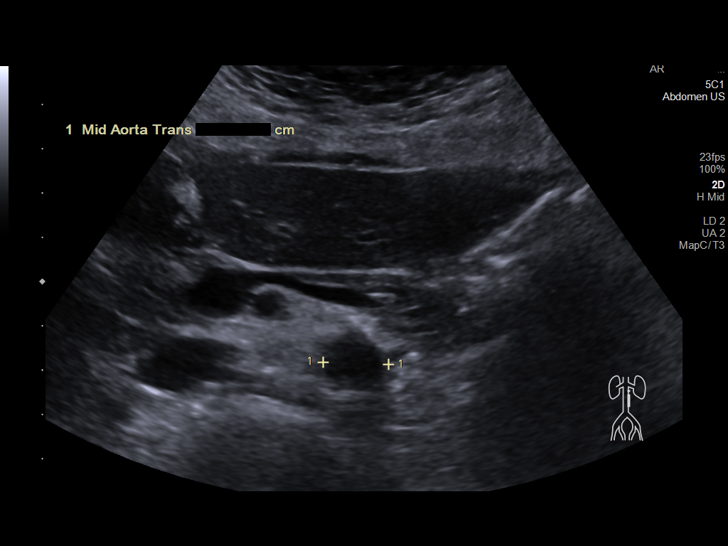
[im 100/100]
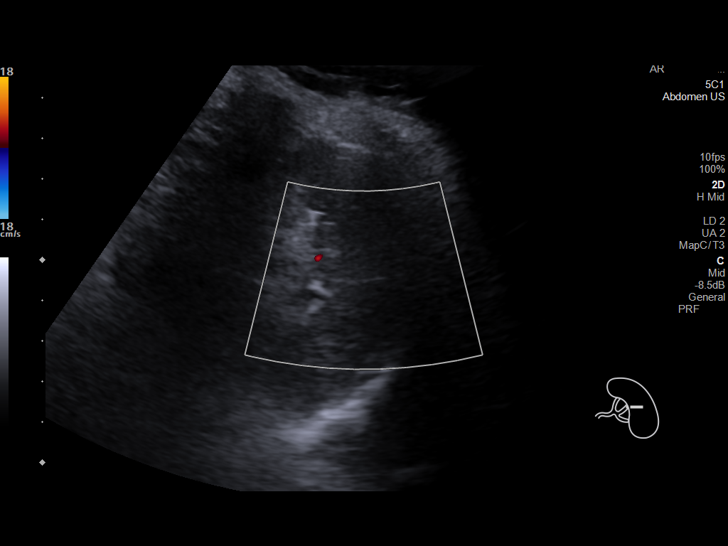

[14 of 25 positions shown; findings below may reference images not displayed]

FINDINGS: Gallbladder: No gallstones or wall thickening visualized. No
sonographic Murphy sign noted by sonographer.

Common bile duct: Diameter: 2 mm, within normal limits.

Liver: No focal lesion identified. Within normal limits in
parenchymal echogenicity. Portal vein is patent on color Doppler
imaging with normal direction of blood flow towards the liver.

IVC: No abnormality visualized.

Pancreas: Visualized portion unremarkable.

Spleen: Size and appearance within normal limits.

Right Kidney: Length: 10.0 cm. Echogenicity within normal limits. No
mass or hydronephrosis visualized.

Left Kidney: Length: 8.6 cm. Echogenicity within normal limits. No
mass or hydronephrosis visualized.

Abdominal aorta: Atherosclerotic plaque noted. No aneurysm
visualized.

Other findings: None.
IMPRESSION: No sonographic signs of hepatobiliary disease.

Abdominal aortic atherosclerosis, without evidence of aneurysm.
